# Patient Record
Sex: Male | Born: 1945
Health system: Southern US, Community
[De-identification: ages and names within clinical notes are randomized; demographics above are authoritative.]

## PROBLEM LIST (undated history)

## (undated) DIAGNOSIS — K0510 Chronic gingivitis, plaque induced: Secondary | ICD-10-CM

## (undated) DIAGNOSIS — I340 Nonrheumatic mitral (valve) insufficiency: Secondary | ICD-10-CM

## (undated) DIAGNOSIS — Z45018 Encounter for adjustment and management of other part of cardiac pacemaker: Secondary | ICD-10-CM

## (undated) DIAGNOSIS — I5023 Acute on chronic systolic (congestive) heart failure: Secondary | ICD-10-CM

## (undated) DIAGNOSIS — K08109 Complete loss of teeth, unspecified cause, unspecified class: Secondary | ICD-10-CM

## (undated) DIAGNOSIS — I428 Other cardiomyopathies: Secondary | ICD-10-CM

## (undated) DIAGNOSIS — R55 Syncope and collapse: Secondary | ICD-10-CM

## (undated) DIAGNOSIS — Z950 Presence of cardiac pacemaker: Secondary | ICD-10-CM

## (undated) DIAGNOSIS — K06020 Generalized gingival recession, unspecified: Secondary | ICD-10-CM

## (undated) DIAGNOSIS — J189 Pneumonia, unspecified organism: Secondary | ICD-10-CM

## (undated) DIAGNOSIS — I2510 Atherosclerotic heart disease of native coronary artery without angina pectoris: Secondary | ICD-10-CM

## (undated) DIAGNOSIS — I442 Atrioventricular block, complete: Secondary | ICD-10-CM

## (undated) DIAGNOSIS — I739 Peripheral vascular disease, unspecified: Secondary | ICD-10-CM

## (undated) DIAGNOSIS — E785 Hyperlipidemia, unspecified: Secondary | ICD-10-CM

## (undated) DIAGNOSIS — I10 Essential (primary) hypertension: Secondary | ICD-10-CM

## (undated) DIAGNOSIS — Z01818 Encounter for other preprocedural examination: Secondary | ICD-10-CM

## (undated) DIAGNOSIS — I429 Cardiomyopathy, unspecified: Secondary | ICD-10-CM

## (undated) DIAGNOSIS — K045 Chronic apical periodontitis: Secondary | ICD-10-CM

## (undated) HISTORY — DX: Other cardiomyopathies: I42.8

## (undated) HISTORY — PX: KNEE SURGERY: SHX244

## (undated) HISTORY — DX: Syncope and collapse: R55

## (undated) HISTORY — PX: OTHER SURGICAL HISTORY: SHX169

## (undated) HISTORY — DX: Atrioventricular block, complete: I44.2

## (undated) HISTORY — DX: Presence of cardiac pacemaker: Z95.0

## (undated) HISTORY — PX: VASECTOMY: SHX75

## (undated) HISTORY — PX: INSERT / REPLACE / REMOVE PACEMAKER: SUR710

## (undated) HISTORY — DX: Pneumonia, unspecified organism: J18.9

## (undated) HISTORY — PX: CATARACT EXTRACTION: SUR2

## (undated) HISTORY — DX: Hyperlipidemia, unspecified: E78.5

## (undated) HISTORY — DX: Peripheral vascular disease, unspecified: I73.9

## (undated) HISTORY — DX: Atherosclerotic heart disease of native coronary artery without angina pectoris: I25.10

## (undated) HISTORY — DX: Encounter for adjustment and management of other part of cardiac pacemaker: Z45.018

## (undated) HISTORY — DX: Cardiomyopathy, unspecified: I42.9

---

## 2001-09-09 ENCOUNTER — Inpatient Hospital Stay (HOSPITAL_COMMUNITY): Admission: EM | Admit: 2001-09-09 | Discharge: 2001-09-10 | Payer: Self-pay | Admitting: Cardiology

## 2001-09-10 ENCOUNTER — Encounter: Payer: Self-pay | Admitting: Internal Medicine

## 2011-03-28 DIAGNOSIS — J209 Acute bronchitis, unspecified: Secondary | ICD-10-CM | POA: Diagnosis not present

## 2011-03-28 DIAGNOSIS — Z23 Encounter for immunization: Secondary | ICD-10-CM | POA: Diagnosis not present

## 2011-04-12 DIAGNOSIS — H2589 Other age-related cataract: Secondary | ICD-10-CM | POA: Diagnosis not present

## 2011-04-19 DIAGNOSIS — H18899 Other specified disorders of cornea, unspecified eye: Secondary | ICD-10-CM | POA: Diagnosis not present

## 2011-04-26 DIAGNOSIS — Z09 Encounter for follow-up examination after completed treatment for conditions other than malignant neoplasm: Secondary | ICD-10-CM | POA: Diagnosis not present

## 2011-04-26 DIAGNOSIS — H5210 Myopia, unspecified eye: Secondary | ICD-10-CM | POA: Diagnosis not present

## 2011-05-04 DIAGNOSIS — IMO0001 Reserved for inherently not codable concepts without codable children: Secondary | ICD-10-CM | POA: Diagnosis not present

## 2011-05-04 DIAGNOSIS — M25569 Pain in unspecified knee: Secondary | ICD-10-CM | POA: Diagnosis not present

## 2011-05-04 DIAGNOSIS — S83509A Sprain of unspecified cruciate ligament of unspecified knee, initial encounter: Secondary | ICD-10-CM | POA: Diagnosis not present

## 2011-05-08 DIAGNOSIS — S83509A Sprain of unspecified cruciate ligament of unspecified knee, initial encounter: Secondary | ICD-10-CM | POA: Diagnosis not present

## 2011-05-08 DIAGNOSIS — IMO0001 Reserved for inherently not codable concepts without codable children: Secondary | ICD-10-CM | POA: Diagnosis not present

## 2011-05-08 DIAGNOSIS — M25569 Pain in unspecified knee: Secondary | ICD-10-CM | POA: Diagnosis not present

## 2011-05-09 DIAGNOSIS — I2510 Atherosclerotic heart disease of native coronary artery without angina pectoris: Secondary | ICD-10-CM | POA: Diagnosis not present

## 2011-05-09 DIAGNOSIS — Z950 Presence of cardiac pacemaker: Secondary | ICD-10-CM | POA: Diagnosis not present

## 2011-05-09 DIAGNOSIS — I059 Rheumatic mitral valve disease, unspecified: Secondary | ICD-10-CM | POA: Diagnosis not present

## 2011-05-09 DIAGNOSIS — I499 Cardiac arrhythmia, unspecified: Secondary | ICD-10-CM | POA: Diagnosis not present

## 2011-05-10 DIAGNOSIS — IMO0001 Reserved for inherently not codable concepts without codable children: Secondary | ICD-10-CM | POA: Diagnosis not present

## 2011-05-10 DIAGNOSIS — S83509A Sprain of unspecified cruciate ligament of unspecified knee, initial encounter: Secondary | ICD-10-CM | POA: Diagnosis not present

## 2011-05-10 DIAGNOSIS — M25569 Pain in unspecified knee: Secondary | ICD-10-CM | POA: Diagnosis not present

## 2011-05-19 DIAGNOSIS — IMO0001 Reserved for inherently not codable concepts without codable children: Secondary | ICD-10-CM | POA: Diagnosis not present

## 2011-05-19 DIAGNOSIS — S83509A Sprain of unspecified cruciate ligament of unspecified knee, initial encounter: Secondary | ICD-10-CM | POA: Diagnosis not present

## 2011-05-19 DIAGNOSIS — M25569 Pain in unspecified knee: Secondary | ICD-10-CM | POA: Diagnosis not present

## 2011-05-23 DIAGNOSIS — M25569 Pain in unspecified knee: Secondary | ICD-10-CM | POA: Diagnosis not present

## 2011-05-23 DIAGNOSIS — S83509A Sprain of unspecified cruciate ligament of unspecified knee, initial encounter: Secondary | ICD-10-CM | POA: Diagnosis not present

## 2011-05-23 DIAGNOSIS — IMO0001 Reserved for inherently not codable concepts without codable children: Secondary | ICD-10-CM | POA: Diagnosis not present

## 2011-05-23 DIAGNOSIS — Z950 Presence of cardiac pacemaker: Secondary | ICD-10-CM | POA: Diagnosis not present

## 2011-05-26 DIAGNOSIS — IMO0001 Reserved for inherently not codable concepts without codable children: Secondary | ICD-10-CM | POA: Diagnosis not present

## 2011-05-26 DIAGNOSIS — M25569 Pain in unspecified knee: Secondary | ICD-10-CM | POA: Diagnosis not present

## 2011-05-26 DIAGNOSIS — S83509A Sprain of unspecified cruciate ligament of unspecified knee, initial encounter: Secondary | ICD-10-CM | POA: Diagnosis not present

## 2011-05-30 DIAGNOSIS — M25569 Pain in unspecified knee: Secondary | ICD-10-CM | POA: Diagnosis not present

## 2011-05-30 DIAGNOSIS — S83509A Sprain of unspecified cruciate ligament of unspecified knee, initial encounter: Secondary | ICD-10-CM | POA: Diagnosis not present

## 2011-05-30 DIAGNOSIS — IMO0001 Reserved for inherently not codable concepts without codable children: Secondary | ICD-10-CM | POA: Diagnosis not present

## 2011-05-31 DIAGNOSIS — S83509A Sprain of unspecified cruciate ligament of unspecified knee, initial encounter: Secondary | ICD-10-CM | POA: Diagnosis not present

## 2011-05-31 DIAGNOSIS — Z6825 Body mass index (BMI) 25.0-25.9, adult: Secondary | ICD-10-CM | POA: Diagnosis not present

## 2011-06-02 DIAGNOSIS — M25569 Pain in unspecified knee: Secondary | ICD-10-CM | POA: Diagnosis not present

## 2011-06-02 DIAGNOSIS — S83509A Sprain of unspecified cruciate ligament of unspecified knee, initial encounter: Secondary | ICD-10-CM | POA: Diagnosis not present

## 2011-06-02 DIAGNOSIS — IMO0001 Reserved for inherently not codable concepts without codable children: Secondary | ICD-10-CM | POA: Diagnosis not present

## 2011-06-22 DIAGNOSIS — I499 Cardiac arrhythmia, unspecified: Secondary | ICD-10-CM | POA: Diagnosis not present

## 2011-06-22 DIAGNOSIS — Z950 Presence of cardiac pacemaker: Secondary | ICD-10-CM | POA: Diagnosis not present

## 2011-06-22 DIAGNOSIS — I498 Other specified cardiac arrhythmias: Secondary | ICD-10-CM | POA: Diagnosis not present

## 2011-06-22 DIAGNOSIS — I4430 Unspecified atrioventricular block: Secondary | ICD-10-CM | POA: Diagnosis not present

## 2011-07-12 DIAGNOSIS — Z6825 Body mass index (BMI) 25.0-25.9, adult: Secondary | ICD-10-CM | POA: Diagnosis not present

## 2011-07-12 DIAGNOSIS — S83509A Sprain of unspecified cruciate ligament of unspecified knee, initial encounter: Secondary | ICD-10-CM | POA: Diagnosis not present

## 2011-07-20 DIAGNOSIS — S83509A Sprain of unspecified cruciate ligament of unspecified knee, initial encounter: Secondary | ICD-10-CM | POA: Diagnosis not present

## 2011-07-26 DIAGNOSIS — M2240 Chondromalacia patellae, unspecified knee: Secondary | ICD-10-CM | POA: Diagnosis not present

## 2011-07-26 DIAGNOSIS — M25569 Pain in unspecified knee: Secondary | ICD-10-CM | POA: Diagnosis not present

## 2011-07-26 DIAGNOSIS — S83509A Sprain of unspecified cruciate ligament of unspecified knee, initial encounter: Secondary | ICD-10-CM | POA: Diagnosis not present

## 2011-07-27 DIAGNOSIS — S83509A Sprain of unspecified cruciate ligament of unspecified knee, initial encounter: Secondary | ICD-10-CM | POA: Diagnosis not present

## 2011-08-02 DIAGNOSIS — H2510 Age-related nuclear cataract, unspecified eye: Secondary | ICD-10-CM | POA: Diagnosis not present

## 2011-08-03 DIAGNOSIS — I059 Rheumatic mitral valve disease, unspecified: Secondary | ICD-10-CM | POA: Diagnosis not present

## 2011-08-03 DIAGNOSIS — I2510 Atherosclerotic heart disease of native coronary artery without angina pectoris: Secondary | ICD-10-CM | POA: Diagnosis not present

## 2011-08-03 DIAGNOSIS — Z950 Presence of cardiac pacemaker: Secondary | ICD-10-CM | POA: Diagnosis not present

## 2011-08-03 DIAGNOSIS — I739 Peripheral vascular disease, unspecified: Secondary | ICD-10-CM | POA: Diagnosis not present

## 2011-08-04 DIAGNOSIS — Z950 Presence of cardiac pacemaker: Secondary | ICD-10-CM | POA: Diagnosis not present

## 2011-08-30 DIAGNOSIS — Z0389 Encounter for observation for other suspected diseases and conditions ruled out: Secondary | ICD-10-CM | POA: Diagnosis not present

## 2011-08-30 DIAGNOSIS — Z950 Presence of cardiac pacemaker: Secondary | ICD-10-CM | POA: Diagnosis not present

## 2011-08-30 DIAGNOSIS — M1710 Unilateral primary osteoarthritis, unspecified knee: Secondary | ICD-10-CM | POA: Diagnosis not present

## 2011-08-30 DIAGNOSIS — S83509A Sprain of unspecified cruciate ligament of unspecified knee, initial encounter: Secondary | ICD-10-CM | POA: Diagnosis not present

## 2011-08-30 DIAGNOSIS — M2350 Chronic instability of knee, unspecified knee: Secondary | ICD-10-CM | POA: Diagnosis not present

## 2011-08-30 DIAGNOSIS — Z87891 Personal history of nicotine dependence: Secondary | ICD-10-CM | POA: Diagnosis not present

## 2011-09-04 DIAGNOSIS — M1710 Unilateral primary osteoarthritis, unspecified knee: Secondary | ICD-10-CM | POA: Diagnosis not present

## 2011-09-04 DIAGNOSIS — M2350 Chronic instability of knee, unspecified knee: Secondary | ICD-10-CM | POA: Diagnosis not present

## 2011-09-04 DIAGNOSIS — G8918 Other acute postprocedural pain: Secondary | ICD-10-CM | POA: Diagnosis not present

## 2011-09-04 DIAGNOSIS — M25569 Pain in unspecified knee: Secondary | ICD-10-CM | POA: Diagnosis not present

## 2011-09-04 DIAGNOSIS — Z87891 Personal history of nicotine dependence: Secondary | ICD-10-CM | POA: Diagnosis not present

## 2011-09-04 DIAGNOSIS — Z950 Presence of cardiac pacemaker: Secondary | ICD-10-CM | POA: Diagnosis not present

## 2011-09-04 DIAGNOSIS — S83509A Sprain of unspecified cruciate ligament of unspecified knee, initial encounter: Secondary | ICD-10-CM | POA: Diagnosis not present

## 2011-09-07 DIAGNOSIS — M25569 Pain in unspecified knee: Secondary | ICD-10-CM | POA: Diagnosis not present

## 2011-09-13 DIAGNOSIS — I499 Cardiac arrhythmia, unspecified: Secondary | ICD-10-CM | POA: Diagnosis not present

## 2011-09-13 DIAGNOSIS — M25569 Pain in unspecified knee: Secondary | ICD-10-CM | POA: Diagnosis not present

## 2011-09-13 DIAGNOSIS — I059 Rheumatic mitral valve disease, unspecified: Secondary | ICD-10-CM | POA: Diagnosis not present

## 2011-09-15 DIAGNOSIS — M25569 Pain in unspecified knee: Secondary | ICD-10-CM | POA: Diagnosis not present

## 2011-09-19 DIAGNOSIS — M25569 Pain in unspecified knee: Secondary | ICD-10-CM | POA: Diagnosis not present

## 2011-09-21 DIAGNOSIS — M25569 Pain in unspecified knee: Secondary | ICD-10-CM | POA: Diagnosis not present

## 2011-09-25 DIAGNOSIS — Z7982 Long term (current) use of aspirin: Secondary | ICD-10-CM | POA: Diagnosis not present

## 2011-09-25 DIAGNOSIS — I2510 Atherosclerotic heart disease of native coronary artery without angina pectoris: Secondary | ICD-10-CM | POA: Diagnosis not present

## 2011-09-25 DIAGNOSIS — I059 Rheumatic mitral valve disease, unspecified: Secondary | ICD-10-CM | POA: Diagnosis not present

## 2011-09-25 DIAGNOSIS — E785 Hyperlipidemia, unspecified: Secondary | ICD-10-CM | POA: Diagnosis not present

## 2011-09-25 DIAGNOSIS — Z0389 Encounter for observation for other suspected diseases and conditions ruled out: Secondary | ICD-10-CM | POA: Diagnosis not present

## 2011-09-25 DIAGNOSIS — I4430 Unspecified atrioventricular block: Secondary | ICD-10-CM | POA: Diagnosis not present

## 2011-09-25 DIAGNOSIS — Z79899 Other long term (current) drug therapy: Secondary | ICD-10-CM | POA: Diagnosis not present

## 2011-09-25 DIAGNOSIS — Z45018 Encounter for adjustment and management of other part of cardiac pacemaker: Secondary | ICD-10-CM | POA: Diagnosis not present

## 2011-09-25 DIAGNOSIS — I739 Peripheral vascular disease, unspecified: Secondary | ICD-10-CM | POA: Diagnosis not present

## 2011-09-26 DIAGNOSIS — E785 Hyperlipidemia, unspecified: Secondary | ICD-10-CM | POA: Diagnosis not present

## 2011-09-26 DIAGNOSIS — I059 Rheumatic mitral valve disease, unspecified: Secondary | ICD-10-CM | POA: Diagnosis not present

## 2011-09-26 DIAGNOSIS — Z7982 Long term (current) use of aspirin: Secondary | ICD-10-CM | POA: Diagnosis not present

## 2011-09-26 DIAGNOSIS — I739 Peripheral vascular disease, unspecified: Secondary | ICD-10-CM | POA: Diagnosis not present

## 2011-09-26 DIAGNOSIS — Z79899 Other long term (current) drug therapy: Secondary | ICD-10-CM | POA: Diagnosis not present

## 2011-09-26 DIAGNOSIS — Z23 Encounter for immunization: Secondary | ICD-10-CM | POA: Diagnosis not present

## 2011-09-26 DIAGNOSIS — Z45018 Encounter for adjustment and management of other part of cardiac pacemaker: Secondary | ICD-10-CM | POA: Diagnosis not present

## 2011-09-26 DIAGNOSIS — I2510 Atherosclerotic heart disease of native coronary artery without angina pectoris: Secondary | ICD-10-CM | POA: Diagnosis not present

## 2011-09-27 DIAGNOSIS — M25569 Pain in unspecified knee: Secondary | ICD-10-CM | POA: Diagnosis not present

## 2011-09-29 DIAGNOSIS — M25569 Pain in unspecified knee: Secondary | ICD-10-CM | POA: Diagnosis not present

## 2011-10-02 DIAGNOSIS — Z950 Presence of cardiac pacemaker: Secondary | ICD-10-CM | POA: Diagnosis not present

## 2011-10-03 DIAGNOSIS — M25569 Pain in unspecified knee: Secondary | ICD-10-CM | POA: Diagnosis not present

## 2011-10-05 DIAGNOSIS — M25569 Pain in unspecified knee: Secondary | ICD-10-CM | POA: Diagnosis not present

## 2011-10-09 DIAGNOSIS — M25569 Pain in unspecified knee: Secondary | ICD-10-CM | POA: Diagnosis not present

## 2011-10-10 DIAGNOSIS — H259 Unspecified age-related cataract: Secondary | ICD-10-CM | POA: Diagnosis not present

## 2011-10-10 DIAGNOSIS — H269 Unspecified cataract: Secondary | ICD-10-CM | POA: Diagnosis not present

## 2011-10-10 DIAGNOSIS — H2510 Age-related nuclear cataract, unspecified eye: Secondary | ICD-10-CM | POA: Diagnosis not present

## 2011-10-11 DIAGNOSIS — M25569 Pain in unspecified knee: Secondary | ICD-10-CM | POA: Diagnosis not present

## 2011-10-17 DIAGNOSIS — M25569 Pain in unspecified knee: Secondary | ICD-10-CM | POA: Diagnosis not present

## 2011-10-20 DIAGNOSIS — M25569 Pain in unspecified knee: Secondary | ICD-10-CM | POA: Diagnosis not present

## 2011-10-23 DIAGNOSIS — M25569 Pain in unspecified knee: Secondary | ICD-10-CM | POA: Diagnosis not present

## 2011-10-26 DIAGNOSIS — M25569 Pain in unspecified knee: Secondary | ICD-10-CM | POA: Diagnosis not present

## 2011-10-30 DIAGNOSIS — M25569 Pain in unspecified knee: Secondary | ICD-10-CM | POA: Diagnosis not present

## 2011-11-02 DIAGNOSIS — M25569 Pain in unspecified knee: Secondary | ICD-10-CM | POA: Diagnosis not present

## 2011-11-06 DIAGNOSIS — I2510 Atherosclerotic heart disease of native coronary artery without angina pectoris: Secondary | ICD-10-CM | POA: Diagnosis not present

## 2011-11-06 DIAGNOSIS — Z950 Presence of cardiac pacemaker: Secondary | ICD-10-CM | POA: Diagnosis not present

## 2011-11-06 DIAGNOSIS — E785 Hyperlipidemia, unspecified: Secondary | ICD-10-CM | POA: Diagnosis not present

## 2011-11-06 DIAGNOSIS — I739 Peripheral vascular disease, unspecified: Secondary | ICD-10-CM | POA: Diagnosis not present

## 2011-11-06 DIAGNOSIS — M25569 Pain in unspecified knee: Secondary | ICD-10-CM | POA: Diagnosis not present

## 2011-11-07 DIAGNOSIS — H269 Unspecified cataract: Secondary | ICD-10-CM | POA: Diagnosis not present

## 2011-11-07 DIAGNOSIS — H259 Unspecified age-related cataract: Secondary | ICD-10-CM | POA: Diagnosis not present

## 2011-11-07 DIAGNOSIS — H2510 Age-related nuclear cataract, unspecified eye: Secondary | ICD-10-CM | POA: Diagnosis not present

## 2011-11-09 DIAGNOSIS — M25569 Pain in unspecified knee: Secondary | ICD-10-CM | POA: Diagnosis not present

## 2011-11-16 DIAGNOSIS — M25569 Pain in unspecified knee: Secondary | ICD-10-CM | POA: Diagnosis not present

## 2011-11-28 DIAGNOSIS — E785 Hyperlipidemia, unspecified: Secondary | ICD-10-CM | POA: Diagnosis not present

## 2011-12-06 DIAGNOSIS — S83509A Sprain of unspecified cruciate ligament of unspecified knee, initial encounter: Secondary | ICD-10-CM | POA: Diagnosis not present

## 2011-12-19 DIAGNOSIS — Z23 Encounter for immunization: Secondary | ICD-10-CM | POA: Diagnosis not present

## 2012-01-31 DIAGNOSIS — S83509A Sprain of unspecified cruciate ligament of unspecified knee, initial encounter: Secondary | ICD-10-CM | POA: Diagnosis not present

## 2012-02-08 DIAGNOSIS — M25569 Pain in unspecified knee: Secondary | ICD-10-CM | POA: Diagnosis not present

## 2012-04-24 DIAGNOSIS — L218 Other seborrheic dermatitis: Secondary | ICD-10-CM | POA: Diagnosis not present

## 2012-04-24 DIAGNOSIS — Z0000 Encounter for general adult medical examination without abnormal findings: Secondary | ICD-10-CM | POA: Diagnosis not present

## 2012-05-03 DIAGNOSIS — C44319 Basal cell carcinoma of skin of other parts of face: Secondary | ICD-10-CM | POA: Diagnosis not present

## 2012-05-03 DIAGNOSIS — D492 Neoplasm of unspecified behavior of bone, soft tissue, and skin: Secondary | ICD-10-CM | POA: Diagnosis not present

## 2012-05-06 DIAGNOSIS — I059 Rheumatic mitral valve disease, unspecified: Secondary | ICD-10-CM | POA: Diagnosis not present

## 2012-05-06 DIAGNOSIS — Z950 Presence of cardiac pacemaker: Secondary | ICD-10-CM | POA: Diagnosis not present

## 2012-05-06 DIAGNOSIS — K5732 Diverticulitis of large intestine without perforation or abscess without bleeding: Secondary | ICD-10-CM | POA: Diagnosis not present

## 2012-05-08 DIAGNOSIS — I6529 Occlusion and stenosis of unspecified carotid artery: Secondary | ICD-10-CM | POA: Diagnosis not present

## 2012-05-13 DIAGNOSIS — I499 Cardiac arrhythmia, unspecified: Secondary | ICD-10-CM | POA: Diagnosis not present

## 2012-05-13 DIAGNOSIS — E7800 Pure hypercholesterolemia, unspecified: Secondary | ICD-10-CM | POA: Diagnosis not present

## 2012-05-13 DIAGNOSIS — E782 Mixed hyperlipidemia: Secondary | ICD-10-CM | POA: Diagnosis not present

## 2012-05-13 DIAGNOSIS — Z950 Presence of cardiac pacemaker: Secondary | ICD-10-CM | POA: Diagnosis not present

## 2012-05-14 DIAGNOSIS — L578 Other skin changes due to chronic exposure to nonionizing radiation: Secondary | ICD-10-CM | POA: Diagnosis not present

## 2012-05-14 DIAGNOSIS — L0201 Cutaneous abscess of face: Secondary | ICD-10-CM | POA: Diagnosis not present

## 2012-05-15 DIAGNOSIS — L578 Other skin changes due to chronic exposure to nonionizing radiation: Secondary | ICD-10-CM | POA: Diagnosis not present

## 2012-05-15 DIAGNOSIS — L0291 Cutaneous abscess, unspecified: Secondary | ICD-10-CM | POA: Diagnosis not present

## 2012-05-15 DIAGNOSIS — L0390 Cellulitis, unspecified: Secondary | ICD-10-CM | POA: Diagnosis not present

## 2012-05-22 DIAGNOSIS — Z23 Encounter for immunization: Secondary | ICD-10-CM | POA: Diagnosis not present

## 2012-05-22 DIAGNOSIS — E7800 Pure hypercholesterolemia, unspecified: Secondary | ICD-10-CM | POA: Diagnosis not present

## 2012-05-22 DIAGNOSIS — Z79899 Other long term (current) drug therapy: Secondary | ICD-10-CM | POA: Diagnosis not present

## 2012-05-30 DIAGNOSIS — M25569 Pain in unspecified knee: Secondary | ICD-10-CM | POA: Diagnosis not present

## 2012-08-12 DIAGNOSIS — S63659A Sprain of metacarpophalangeal joint of unspecified finger, initial encounter: Secondary | ICD-10-CM | POA: Diagnosis not present

## 2012-08-13 DIAGNOSIS — I498 Other specified cardiac arrhythmias: Secondary | ICD-10-CM | POA: Diagnosis not present

## 2012-08-14 DIAGNOSIS — S63659A Sprain of metacarpophalangeal joint of unspecified finger, initial encounter: Secondary | ICD-10-CM | POA: Diagnosis not present

## 2012-09-09 DIAGNOSIS — S63659A Sprain of metacarpophalangeal joint of unspecified finger, initial encounter: Secondary | ICD-10-CM | POA: Diagnosis not present

## 2012-09-11 DIAGNOSIS — S63659A Sprain of metacarpophalangeal joint of unspecified finger, initial encounter: Secondary | ICD-10-CM | POA: Diagnosis not present

## 2012-09-16 DIAGNOSIS — M25559 Pain in unspecified hip: Secondary | ICD-10-CM | POA: Diagnosis not present

## 2012-09-24 DIAGNOSIS — S63659A Sprain of metacarpophalangeal joint of unspecified finger, initial encounter: Secondary | ICD-10-CM | POA: Diagnosis not present

## 2012-11-06 DIAGNOSIS — S63659A Sprain of metacarpophalangeal joint of unspecified finger, initial encounter: Secondary | ICD-10-CM | POA: Diagnosis not present

## 2012-11-08 DIAGNOSIS — M25549 Pain in joints of unspecified hand: Secondary | ICD-10-CM | POA: Diagnosis not present

## 2012-11-08 DIAGNOSIS — S63659A Sprain of metacarpophalangeal joint of unspecified finger, initial encounter: Secondary | ICD-10-CM | POA: Diagnosis not present

## 2012-11-08 DIAGNOSIS — IMO0001 Reserved for inherently not codable concepts without codable children: Secondary | ICD-10-CM | POA: Diagnosis not present

## 2012-11-08 DIAGNOSIS — M25649 Stiffness of unspecified hand, not elsewhere classified: Secondary | ICD-10-CM | POA: Diagnosis not present

## 2012-11-11 DIAGNOSIS — IMO0001 Reserved for inherently not codable concepts without codable children: Secondary | ICD-10-CM | POA: Diagnosis not present

## 2012-11-11 DIAGNOSIS — M25549 Pain in joints of unspecified hand: Secondary | ICD-10-CM | POA: Diagnosis not present

## 2012-11-11 DIAGNOSIS — S63659A Sprain of metacarpophalangeal joint of unspecified finger, initial encounter: Secondary | ICD-10-CM | POA: Diagnosis not present

## 2012-11-11 DIAGNOSIS — M25649 Stiffness of unspecified hand, not elsewhere classified: Secondary | ICD-10-CM | POA: Diagnosis not present

## 2012-11-14 DIAGNOSIS — IMO0001 Reserved for inherently not codable concepts without codable children: Secondary | ICD-10-CM | POA: Diagnosis not present

## 2012-11-14 DIAGNOSIS — S63659A Sprain of metacarpophalangeal joint of unspecified finger, initial encounter: Secondary | ICD-10-CM | POA: Diagnosis not present

## 2012-11-14 DIAGNOSIS — M25549 Pain in joints of unspecified hand: Secondary | ICD-10-CM | POA: Diagnosis not present

## 2012-11-14 DIAGNOSIS — M25649 Stiffness of unspecified hand, not elsewhere classified: Secondary | ICD-10-CM | POA: Diagnosis not present

## 2012-11-21 DIAGNOSIS — M25549 Pain in joints of unspecified hand: Secondary | ICD-10-CM | POA: Diagnosis not present

## 2012-11-21 DIAGNOSIS — IMO0001 Reserved for inherently not codable concepts without codable children: Secondary | ICD-10-CM | POA: Diagnosis not present

## 2012-11-21 DIAGNOSIS — S63659A Sprain of metacarpophalangeal joint of unspecified finger, initial encounter: Secondary | ICD-10-CM | POA: Diagnosis not present

## 2012-11-21 DIAGNOSIS — M25649 Stiffness of unspecified hand, not elsewhere classified: Secondary | ICD-10-CM | POA: Diagnosis not present

## 2012-11-25 DIAGNOSIS — IMO0001 Reserved for inherently not codable concepts without codable children: Secondary | ICD-10-CM | POA: Diagnosis not present

## 2012-11-25 DIAGNOSIS — S63659A Sprain of metacarpophalangeal joint of unspecified finger, initial encounter: Secondary | ICD-10-CM | POA: Diagnosis not present

## 2012-11-25 DIAGNOSIS — M25649 Stiffness of unspecified hand, not elsewhere classified: Secondary | ICD-10-CM | POA: Diagnosis not present

## 2012-11-25 DIAGNOSIS — M25549 Pain in joints of unspecified hand: Secondary | ICD-10-CM | POA: Diagnosis not present

## 2012-11-28 DIAGNOSIS — IMO0001 Reserved for inherently not codable concepts without codable children: Secondary | ICD-10-CM | POA: Diagnosis not present

## 2012-11-28 DIAGNOSIS — M25549 Pain in joints of unspecified hand: Secondary | ICD-10-CM | POA: Diagnosis not present

## 2012-11-28 DIAGNOSIS — M25649 Stiffness of unspecified hand, not elsewhere classified: Secondary | ICD-10-CM | POA: Diagnosis not present

## 2012-11-28 DIAGNOSIS — S63659A Sprain of metacarpophalangeal joint of unspecified finger, initial encounter: Secondary | ICD-10-CM | POA: Diagnosis not present

## 2012-12-03 DIAGNOSIS — M25649 Stiffness of unspecified hand, not elsewhere classified: Secondary | ICD-10-CM | POA: Diagnosis not present

## 2012-12-03 DIAGNOSIS — S63659A Sprain of metacarpophalangeal joint of unspecified finger, initial encounter: Secondary | ICD-10-CM | POA: Diagnosis not present

## 2012-12-03 DIAGNOSIS — IMO0001 Reserved for inherently not codable concepts without codable children: Secondary | ICD-10-CM | POA: Diagnosis not present

## 2012-12-03 DIAGNOSIS — M25549 Pain in joints of unspecified hand: Secondary | ICD-10-CM | POA: Diagnosis not present

## 2012-12-04 DIAGNOSIS — H26499 Other secondary cataract, unspecified eye: Secondary | ICD-10-CM | POA: Diagnosis not present

## 2012-12-04 DIAGNOSIS — S63659A Sprain of metacarpophalangeal joint of unspecified finger, initial encounter: Secondary | ICD-10-CM | POA: Diagnosis not present

## 2012-12-06 DIAGNOSIS — IMO0001 Reserved for inherently not codable concepts without codable children: Secondary | ICD-10-CM | POA: Diagnosis not present

## 2012-12-06 DIAGNOSIS — M25549 Pain in joints of unspecified hand: Secondary | ICD-10-CM | POA: Diagnosis not present

## 2012-12-06 DIAGNOSIS — S63659A Sprain of metacarpophalangeal joint of unspecified finger, initial encounter: Secondary | ICD-10-CM | POA: Diagnosis not present

## 2012-12-06 DIAGNOSIS — M25649 Stiffness of unspecified hand, not elsewhere classified: Secondary | ICD-10-CM | POA: Diagnosis not present

## 2012-12-09 DIAGNOSIS — IMO0001 Reserved for inherently not codable concepts without codable children: Secondary | ICD-10-CM | POA: Diagnosis not present

## 2012-12-09 DIAGNOSIS — M25549 Pain in joints of unspecified hand: Secondary | ICD-10-CM | POA: Diagnosis not present

## 2012-12-09 DIAGNOSIS — M25649 Stiffness of unspecified hand, not elsewhere classified: Secondary | ICD-10-CM | POA: Diagnosis not present

## 2012-12-09 DIAGNOSIS — S63659A Sprain of metacarpophalangeal joint of unspecified finger, initial encounter: Secondary | ICD-10-CM | POA: Diagnosis not present

## 2012-12-10 DIAGNOSIS — I059 Rheumatic mitral valve disease, unspecified: Secondary | ICD-10-CM | POA: Diagnosis not present

## 2012-12-10 DIAGNOSIS — Z950 Presence of cardiac pacemaker: Secondary | ICD-10-CM | POA: Diagnosis not present

## 2012-12-10 DIAGNOSIS — I739 Peripheral vascular disease, unspecified: Secondary | ICD-10-CM | POA: Diagnosis not present

## 2012-12-10 DIAGNOSIS — E785 Hyperlipidemia, unspecified: Secondary | ICD-10-CM | POA: Diagnosis not present

## 2012-12-10 DIAGNOSIS — I2510 Atherosclerotic heart disease of native coronary artery without angina pectoris: Secondary | ICD-10-CM | POA: Diagnosis not present

## 2012-12-12 DIAGNOSIS — IMO0001 Reserved for inherently not codable concepts without codable children: Secondary | ICD-10-CM | POA: Diagnosis not present

## 2012-12-12 DIAGNOSIS — S63659A Sprain of metacarpophalangeal joint of unspecified finger, initial encounter: Secondary | ICD-10-CM | POA: Diagnosis not present

## 2012-12-12 DIAGNOSIS — M25549 Pain in joints of unspecified hand: Secondary | ICD-10-CM | POA: Diagnosis not present

## 2012-12-12 DIAGNOSIS — M25649 Stiffness of unspecified hand, not elsewhere classified: Secondary | ICD-10-CM | POA: Diagnosis not present

## 2012-12-20 DIAGNOSIS — Z23 Encounter for immunization: Secondary | ICD-10-CM | POA: Diagnosis not present

## 2013-01-29 DIAGNOSIS — S63659A Sprain of metacarpophalangeal joint of unspecified finger, initial encounter: Secondary | ICD-10-CM | POA: Diagnosis not present

## 2013-01-29 DIAGNOSIS — G5600 Carpal tunnel syndrome, unspecified upper limb: Secondary | ICD-10-CM | POA: Diagnosis not present

## 2013-02-20 DIAGNOSIS — M25539 Pain in unspecified wrist: Secondary | ICD-10-CM | POA: Diagnosis not present

## 2013-02-20 DIAGNOSIS — R209 Unspecified disturbances of skin sensation: Secondary | ICD-10-CM | POA: Diagnosis not present

## 2013-02-20 DIAGNOSIS — I442 Atrioventricular block, complete: Secondary | ICD-10-CM | POA: Diagnosis not present

## 2013-02-20 DIAGNOSIS — Z950 Presence of cardiac pacemaker: Secondary | ICD-10-CM | POA: Diagnosis not present

## 2013-02-26 DIAGNOSIS — G5600 Carpal tunnel syndrome, unspecified upper limb: Secondary | ICD-10-CM | POA: Diagnosis not present

## 2013-03-06 DIAGNOSIS — J069 Acute upper respiratory infection, unspecified: Secondary | ICD-10-CM | POA: Diagnosis not present

## 2013-03-06 DIAGNOSIS — J0100 Acute maxillary sinusitis, unspecified: Secondary | ICD-10-CM | POA: Diagnosis not present

## 2013-03-12 DIAGNOSIS — G5600 Carpal tunnel syndrome, unspecified upper limb: Secondary | ICD-10-CM | POA: Diagnosis not present

## 2013-03-26 DIAGNOSIS — G5600 Carpal tunnel syndrome, unspecified upper limb: Secondary | ICD-10-CM | POA: Diagnosis not present

## 2013-05-24 DIAGNOSIS — I498 Other specified cardiac arrhythmias: Secondary | ICD-10-CM | POA: Diagnosis not present

## 2013-06-16 DIAGNOSIS — I059 Rheumatic mitral valve disease, unspecified: Secondary | ICD-10-CM | POA: Diagnosis not present

## 2013-06-16 DIAGNOSIS — Z950 Presence of cardiac pacemaker: Secondary | ICD-10-CM | POA: Diagnosis not present

## 2013-06-16 DIAGNOSIS — E785 Hyperlipidemia, unspecified: Secondary | ICD-10-CM | POA: Diagnosis not present

## 2013-06-16 DIAGNOSIS — I499 Cardiac arrhythmia, unspecified: Secondary | ICD-10-CM | POA: Diagnosis not present

## 2013-06-16 DIAGNOSIS — I2510 Atherosclerotic heart disease of native coronary artery without angina pectoris: Secondary | ICD-10-CM | POA: Diagnosis not present

## 2013-06-16 DIAGNOSIS — I739 Peripheral vascular disease, unspecified: Secondary | ICD-10-CM | POA: Diagnosis not present

## 2013-06-19 DIAGNOSIS — I6529 Occlusion and stenosis of unspecified carotid artery: Secondary | ICD-10-CM | POA: Diagnosis not present

## 2013-12-08 DIAGNOSIS — Z23 Encounter for immunization: Secondary | ICD-10-CM | POA: Diagnosis not present

## 2013-12-08 DIAGNOSIS — H26493 Other secondary cataract, bilateral: Secondary | ICD-10-CM | POA: Diagnosis not present

## 2014-02-24 DIAGNOSIS — I498 Other specified cardiac arrhythmias: Secondary | ICD-10-CM | POA: Diagnosis not present

## 2014-04-23 DIAGNOSIS — I440 Atrioventricular block, first degree: Secondary | ICD-10-CM | POA: Diagnosis not present

## 2014-06-11 DIAGNOSIS — E782 Mixed hyperlipidemia: Secondary | ICD-10-CM | POA: Diagnosis not present

## 2014-06-11 DIAGNOSIS — N402 Nodular prostate without lower urinary tract symptoms: Secondary | ICD-10-CM | POA: Diagnosis not present

## 2014-06-11 DIAGNOSIS — I10 Essential (primary) hypertension: Secondary | ICD-10-CM | POA: Diagnosis not present

## 2014-06-11 DIAGNOSIS — Z0000 Encounter for general adult medical examination without abnormal findings: Secondary | ICD-10-CM | POA: Diagnosis not present

## 2014-06-11 DIAGNOSIS — Z23 Encounter for immunization: Secondary | ICD-10-CM | POA: Diagnosis not present

## 2014-06-11 DIAGNOSIS — Z79899 Other long term (current) drug therapy: Secondary | ICD-10-CM | POA: Diagnosis not present

## 2014-06-11 DIAGNOSIS — Z1389 Encounter for screening for other disorder: Secondary | ICD-10-CM | POA: Diagnosis not present

## 2014-06-18 DIAGNOSIS — F4323 Adjustment disorder with mixed anxiety and depressed mood: Secondary | ICD-10-CM | POA: Diagnosis not present

## 2014-06-19 DIAGNOSIS — L570 Actinic keratosis: Secondary | ICD-10-CM | POA: Diagnosis not present

## 2014-06-19 DIAGNOSIS — L089 Local infection of the skin and subcutaneous tissue, unspecified: Secondary | ICD-10-CM | POA: Diagnosis not present

## 2014-06-25 DIAGNOSIS — F4323 Adjustment disorder with mixed anxiety and depressed mood: Secondary | ICD-10-CM | POA: Diagnosis not present

## 2014-07-01 DIAGNOSIS — E785 Hyperlipidemia, unspecified: Secondary | ICD-10-CM | POA: Diagnosis not present

## 2014-07-01 DIAGNOSIS — I739 Peripheral vascular disease, unspecified: Secondary | ICD-10-CM | POA: Diagnosis not present

## 2014-07-01 DIAGNOSIS — I2510 Atherosclerotic heart disease of native coronary artery without angina pectoris: Secondary | ICD-10-CM | POA: Diagnosis not present

## 2014-07-01 DIAGNOSIS — Z950 Presence of cardiac pacemaker: Secondary | ICD-10-CM | POA: Diagnosis not present

## 2014-07-02 DIAGNOSIS — F4323 Adjustment disorder with mixed anxiety and depressed mood: Secondary | ICD-10-CM | POA: Diagnosis not present

## 2014-07-16 DIAGNOSIS — F4323 Adjustment disorder with mixed anxiety and depressed mood: Secondary | ICD-10-CM | POA: Diagnosis not present

## 2014-07-24 DIAGNOSIS — Z45010 Encounter for checking and testing of cardiac pacemaker pulse generator [battery]: Secondary | ICD-10-CM | POA: Diagnosis not present

## 2014-07-24 DIAGNOSIS — I498 Other specified cardiac arrhythmias: Secondary | ICD-10-CM | POA: Diagnosis not present

## 2014-08-03 DIAGNOSIS — F4323 Adjustment disorder with mixed anxiety and depressed mood: Secondary | ICD-10-CM | POA: Diagnosis not present

## 2014-08-20 DIAGNOSIS — F4323 Adjustment disorder with mixed anxiety and depressed mood: Secondary | ICD-10-CM | POA: Diagnosis not present

## 2014-08-31 DIAGNOSIS — F4323 Adjustment disorder with mixed anxiety and depressed mood: Secondary | ICD-10-CM | POA: Diagnosis not present

## 2014-09-09 DIAGNOSIS — Z1389 Encounter for screening for other disorder: Secondary | ICD-10-CM | POA: Diagnosis not present

## 2014-09-09 DIAGNOSIS — M542 Cervicalgia: Secondary | ICD-10-CM | POA: Diagnosis not present

## 2014-09-16 DIAGNOSIS — M542 Cervicalgia: Secondary | ICD-10-CM | POA: Diagnosis not present

## 2014-09-16 DIAGNOSIS — M47812 Spondylosis without myelopathy or radiculopathy, cervical region: Secondary | ICD-10-CM | POA: Diagnosis not present

## 2014-09-16 DIAGNOSIS — M47892 Other spondylosis, cervical region: Secondary | ICD-10-CM | POA: Diagnosis not present

## 2014-10-28 DIAGNOSIS — I498 Other specified cardiac arrhythmias: Secondary | ICD-10-CM | POA: Diagnosis not present

## 2014-10-28 DIAGNOSIS — Z45010 Encounter for checking and testing of cardiac pacemaker pulse generator [battery]: Secondary | ICD-10-CM | POA: Diagnosis not present

## 2014-12-11 DIAGNOSIS — H26493 Other secondary cataract, bilateral: Secondary | ICD-10-CM | POA: Diagnosis not present

## 2014-12-29 DIAGNOSIS — Z0000 Encounter for general adult medical examination without abnormal findings: Secondary | ICD-10-CM | POA: Diagnosis not present

## 2015-02-03 DIAGNOSIS — Z45010 Encounter for checking and testing of cardiac pacemaker pulse generator [battery]: Secondary | ICD-10-CM | POA: Diagnosis not present

## 2015-02-03 DIAGNOSIS — I498 Other specified cardiac arrhythmias: Secondary | ICD-10-CM | POA: Diagnosis not present

## 2015-04-21 DIAGNOSIS — M9901 Segmental and somatic dysfunction of cervical region: Secondary | ICD-10-CM | POA: Diagnosis not present

## 2015-04-21 DIAGNOSIS — M9902 Segmental and somatic dysfunction of thoracic region: Secondary | ICD-10-CM | POA: Diagnosis not present

## 2015-04-21 DIAGNOSIS — M530 Cervicocranial syndrome: Secondary | ICD-10-CM | POA: Diagnosis not present

## 2015-04-23 DIAGNOSIS — M9901 Segmental and somatic dysfunction of cervical region: Secondary | ICD-10-CM | POA: Diagnosis not present

## 2015-04-23 DIAGNOSIS — M9902 Segmental and somatic dysfunction of thoracic region: Secondary | ICD-10-CM | POA: Diagnosis not present

## 2015-04-23 DIAGNOSIS — M791 Myalgia: Secondary | ICD-10-CM | POA: Diagnosis not present

## 2015-04-23 DIAGNOSIS — M530 Cervicocranial syndrome: Secondary | ICD-10-CM | POA: Diagnosis not present

## 2015-04-28 DIAGNOSIS — M9902 Segmental and somatic dysfunction of thoracic region: Secondary | ICD-10-CM | POA: Diagnosis not present

## 2015-04-28 DIAGNOSIS — M9901 Segmental and somatic dysfunction of cervical region: Secondary | ICD-10-CM | POA: Diagnosis not present

## 2015-04-28 DIAGNOSIS — M530 Cervicocranial syndrome: Secondary | ICD-10-CM | POA: Diagnosis not present

## 2015-04-28 DIAGNOSIS — M791 Myalgia: Secondary | ICD-10-CM | POA: Diagnosis not present

## 2015-05-03 DIAGNOSIS — M9901 Segmental and somatic dysfunction of cervical region: Secondary | ICD-10-CM | POA: Diagnosis not present

## 2015-05-03 DIAGNOSIS — M791 Myalgia: Secondary | ICD-10-CM | POA: Diagnosis not present

## 2015-05-03 DIAGNOSIS — M9902 Segmental and somatic dysfunction of thoracic region: Secondary | ICD-10-CM | POA: Diagnosis not present

## 2015-05-03 DIAGNOSIS — M530 Cervicocranial syndrome: Secondary | ICD-10-CM | POA: Diagnosis not present

## 2015-05-10 DIAGNOSIS — M530 Cervicocranial syndrome: Secondary | ICD-10-CM | POA: Diagnosis not present

## 2015-05-10 DIAGNOSIS — M9901 Segmental and somatic dysfunction of cervical region: Secondary | ICD-10-CM | POA: Diagnosis not present

## 2015-05-10 DIAGNOSIS — M9902 Segmental and somatic dysfunction of thoracic region: Secondary | ICD-10-CM | POA: Diagnosis not present

## 2015-05-10 DIAGNOSIS — M791 Myalgia: Secondary | ICD-10-CM | POA: Diagnosis not present

## 2015-06-10 DIAGNOSIS — Z45018 Encounter for adjustment and management of other part of cardiac pacemaker: Secondary | ICD-10-CM | POA: Insufficient documentation

## 2015-06-10 DIAGNOSIS — I442 Atrioventricular block, complete: Secondary | ICD-10-CM | POA: Diagnosis not present

## 2015-06-10 HISTORY — DX: Encounter for adjustment and management of other part of cardiac pacemaker: Z45.018

## 2015-06-10 HISTORY — DX: Atrioventricular block, complete: I44.2

## 2015-07-05 DIAGNOSIS — M76899 Other specified enthesopathies of unspecified lower limb, excluding foot: Secondary | ICD-10-CM | POA: Diagnosis not present

## 2015-07-29 DIAGNOSIS — Z79899 Other long term (current) drug therapy: Secondary | ICD-10-CM | POA: Diagnosis not present

## 2015-07-29 DIAGNOSIS — Z0000 Encounter for general adult medical examination without abnormal findings: Secondary | ICD-10-CM | POA: Diagnosis not present

## 2015-07-29 DIAGNOSIS — E782 Mixed hyperlipidemia: Secondary | ICD-10-CM | POA: Diagnosis not present

## 2015-07-29 DIAGNOSIS — R0989 Other specified symptoms and signs involving the circulatory and respiratory systems: Secondary | ICD-10-CM | POA: Diagnosis not present

## 2015-07-29 DIAGNOSIS — Z1211 Encounter for screening for malignant neoplasm of colon: Secondary | ICD-10-CM | POA: Diagnosis not present

## 2015-07-29 DIAGNOSIS — Z125 Encounter for screening for malignant neoplasm of prostate: Secondary | ICD-10-CM | POA: Diagnosis not present

## 2015-08-03 DIAGNOSIS — Z23 Encounter for immunization: Secondary | ICD-10-CM | POA: Diagnosis not present

## 2015-08-04 DIAGNOSIS — Z1211 Encounter for screening for malignant neoplasm of colon: Secondary | ICD-10-CM | POA: Diagnosis not present

## 2015-08-19 DIAGNOSIS — I6523 Occlusion and stenosis of bilateral carotid arteries: Secondary | ICD-10-CM | POA: Diagnosis not present

## 2015-08-19 DIAGNOSIS — N281 Cyst of kidney, acquired: Secondary | ICD-10-CM | POA: Diagnosis not present

## 2015-08-19 DIAGNOSIS — N189 Chronic kidney disease, unspecified: Secondary | ICD-10-CM | POA: Diagnosis not present

## 2015-08-19 DIAGNOSIS — R0989 Other specified symptoms and signs involving the circulatory and respiratory systems: Secondary | ICD-10-CM | POA: Diagnosis not present

## 2015-08-27 DIAGNOSIS — C44712 Basal cell carcinoma of skin of right lower limb, including hip: Secondary | ICD-10-CM | POA: Diagnosis not present

## 2015-09-09 DIAGNOSIS — Z950 Presence of cardiac pacemaker: Secondary | ICD-10-CM | POA: Diagnosis not present

## 2015-10-14 ENCOUNTER — Other Ambulatory Visit: Payer: Self-pay

## 2015-11-04 DIAGNOSIS — S6390XA Sprain of unspecified part of unspecified wrist and hand, initial encounter: Secondary | ICD-10-CM | POA: Diagnosis not present

## 2015-11-04 DIAGNOSIS — N400 Enlarged prostate without lower urinary tract symptoms: Secondary | ICD-10-CM | POA: Diagnosis not present

## 2015-11-08 DIAGNOSIS — M629 Disorder of muscle, unspecified: Secondary | ICD-10-CM | POA: Diagnosis not present

## 2015-11-08 DIAGNOSIS — S6390XS Sprain of unspecified part of unspecified wrist and hand, sequela: Secondary | ICD-10-CM | POA: Diagnosis not present

## 2015-11-08 DIAGNOSIS — M79645 Pain in left finger(s): Secondary | ICD-10-CM | POA: Diagnosis not present

## 2015-11-08 DIAGNOSIS — M79644 Pain in right finger(s): Secondary | ICD-10-CM | POA: Diagnosis not present

## 2015-11-19 DIAGNOSIS — S6390XS Sprain of unspecified part of unspecified wrist and hand, sequela: Secondary | ICD-10-CM | POA: Diagnosis not present

## 2015-11-19 DIAGNOSIS — M79645 Pain in left finger(s): Secondary | ICD-10-CM | POA: Diagnosis not present

## 2015-11-19 DIAGNOSIS — M79644 Pain in right finger(s): Secondary | ICD-10-CM | POA: Diagnosis not present

## 2015-11-19 DIAGNOSIS — M629 Disorder of muscle, unspecified: Secondary | ICD-10-CM | POA: Diagnosis not present

## 2015-11-26 DIAGNOSIS — S6390XS Sprain of unspecified part of unspecified wrist and hand, sequela: Secondary | ICD-10-CM | POA: Diagnosis not present

## 2015-11-26 DIAGNOSIS — M79644 Pain in right finger(s): Secondary | ICD-10-CM | POA: Diagnosis not present

## 2015-11-26 DIAGNOSIS — M79645 Pain in left finger(s): Secondary | ICD-10-CM | POA: Diagnosis not present

## 2015-11-26 DIAGNOSIS — M629 Disorder of muscle, unspecified: Secondary | ICD-10-CM | POA: Diagnosis not present

## 2015-12-04 DIAGNOSIS — K529 Noninfective gastroenteritis and colitis, unspecified: Secondary | ICD-10-CM | POA: Diagnosis not present

## 2015-12-04 DIAGNOSIS — N281 Cyst of kidney, acquired: Secondary | ICD-10-CM | POA: Diagnosis not present

## 2015-12-08 DIAGNOSIS — R109 Unspecified abdominal pain: Secondary | ICD-10-CM | POA: Diagnosis not present

## 2015-12-09 DIAGNOSIS — M629 Disorder of muscle, unspecified: Secondary | ICD-10-CM | POA: Diagnosis not present

## 2015-12-09 DIAGNOSIS — M79644 Pain in right finger(s): Secondary | ICD-10-CM | POA: Diagnosis not present

## 2015-12-09 DIAGNOSIS — S6390XS Sprain of unspecified part of unspecified wrist and hand, sequela: Secondary | ICD-10-CM | POA: Diagnosis not present

## 2015-12-09 DIAGNOSIS — M79645 Pain in left finger(s): Secondary | ICD-10-CM | POA: Diagnosis not present

## 2015-12-10 DIAGNOSIS — N410 Acute prostatitis: Secondary | ICD-10-CM | POA: Diagnosis not present

## 2015-12-10 DIAGNOSIS — Z950 Presence of cardiac pacemaker: Secondary | ICD-10-CM | POA: Diagnosis not present

## 2015-12-14 DIAGNOSIS — H26493 Other secondary cataract, bilateral: Secondary | ICD-10-CM | POA: Diagnosis not present

## 2015-12-17 DIAGNOSIS — M79645 Pain in left finger(s): Secondary | ICD-10-CM | POA: Diagnosis not present

## 2015-12-17 DIAGNOSIS — M79644 Pain in right finger(s): Secondary | ICD-10-CM | POA: Diagnosis not present

## 2015-12-17 DIAGNOSIS — S6390XS Sprain of unspecified part of unspecified wrist and hand, sequela: Secondary | ICD-10-CM | POA: Diagnosis not present

## 2015-12-17 DIAGNOSIS — M629 Disorder of muscle, unspecified: Secondary | ICD-10-CM | POA: Diagnosis not present

## 2015-12-22 DIAGNOSIS — Z1211 Encounter for screening for malignant neoplasm of colon: Secondary | ICD-10-CM | POA: Diagnosis not present

## 2016-01-24 DIAGNOSIS — Z9181 History of falling: Secondary | ICD-10-CM | POA: Diagnosis not present

## 2016-01-24 DIAGNOSIS — Z1389 Encounter for screening for other disorder: Secondary | ICD-10-CM | POA: Diagnosis not present

## 2016-01-24 DIAGNOSIS — N401 Enlarged prostate with lower urinary tract symptoms: Secondary | ICD-10-CM | POA: Diagnosis not present

## 2016-01-24 DIAGNOSIS — N138 Other obstructive and reflux uropathy: Secondary | ICD-10-CM | POA: Diagnosis not present

## 2016-05-10 DIAGNOSIS — N401 Enlarged prostate with lower urinary tract symptoms: Secondary | ICD-10-CM | POA: Diagnosis not present

## 2016-05-10 DIAGNOSIS — N138 Other obstructive and reflux uropathy: Secondary | ICD-10-CM | POA: Diagnosis not present

## 2016-05-10 DIAGNOSIS — Z9181 History of falling: Secondary | ICD-10-CM | POA: Diagnosis not present

## 2016-05-10 DIAGNOSIS — Z1389 Encounter for screening for other disorder: Secondary | ICD-10-CM | POA: Diagnosis not present

## 2016-06-22 DIAGNOSIS — I442 Atrioventricular block, complete: Secondary | ICD-10-CM | POA: Diagnosis not present

## 2016-06-22 DIAGNOSIS — Z45018 Encounter for adjustment and management of other part of cardiac pacemaker: Secondary | ICD-10-CM | POA: Diagnosis not present

## 2016-11-27 DIAGNOSIS — R0989 Other specified symptoms and signs involving the circulatory and respiratory systems: Secondary | ICD-10-CM | POA: Diagnosis not present

## 2016-11-27 DIAGNOSIS — Z79899 Other long term (current) drug therapy: Secondary | ICD-10-CM | POA: Diagnosis not present

## 2016-11-27 DIAGNOSIS — Z125 Encounter for screening for malignant neoplasm of prostate: Secondary | ICD-10-CM | POA: Diagnosis not present

## 2016-11-27 DIAGNOSIS — Z0000 Encounter for general adult medical examination without abnormal findings: Secondary | ICD-10-CM | POA: Diagnosis not present

## 2016-11-27 DIAGNOSIS — R011 Cardiac murmur, unspecified: Secondary | ICD-10-CM | POA: Diagnosis not present

## 2016-11-27 DIAGNOSIS — Z23 Encounter for immunization: Secondary | ICD-10-CM | POA: Diagnosis not present

## 2016-11-27 DIAGNOSIS — Z1331 Encounter for screening for depression: Secondary | ICD-10-CM | POA: Diagnosis not present

## 2016-12-04 DIAGNOSIS — R0989 Other specified symptoms and signs involving the circulatory and respiratory systems: Secondary | ICD-10-CM | POA: Diagnosis not present

## 2016-12-04 DIAGNOSIS — I659 Occlusion and stenosis of unspecified precerebral artery: Secondary | ICD-10-CM | POA: Diagnosis not present

## 2017-04-05 DIAGNOSIS — F4323 Adjustment disorder with mixed anxiety and depressed mood: Secondary | ICD-10-CM | POA: Diagnosis not present

## 2017-04-12 DIAGNOSIS — F4323 Adjustment disorder with mixed anxiety and depressed mood: Secondary | ICD-10-CM | POA: Diagnosis not present

## 2017-04-23 DIAGNOSIS — Z9181 History of falling: Secondary | ICD-10-CM | POA: Diagnosis not present

## 2017-04-23 DIAGNOSIS — R41840 Attention and concentration deficit: Secondary | ICD-10-CM | POA: Diagnosis not present

## 2017-04-23 DIAGNOSIS — Z1331 Encounter for screening for depression: Secondary | ICD-10-CM | POA: Diagnosis not present

## 2017-04-23 DIAGNOSIS — I4510 Unspecified right bundle-branch block: Secondary | ICD-10-CM | POA: Diagnosis not present

## 2017-04-25 DIAGNOSIS — E785 Hyperlipidemia, unspecified: Secondary | ICD-10-CM | POA: Diagnosis not present

## 2017-04-25 DIAGNOSIS — Z79899 Other long term (current) drug therapy: Secondary | ICD-10-CM | POA: Diagnosis not present

## 2017-04-27 DIAGNOSIS — I340 Nonrheumatic mitral (valve) insufficiency: Secondary | ICD-10-CM | POA: Diagnosis not present

## 2017-05-03 DIAGNOSIS — Z1331 Encounter for screening for depression: Secondary | ICD-10-CM | POA: Diagnosis not present

## 2017-05-03 DIAGNOSIS — Z9181 History of falling: Secondary | ICD-10-CM | POA: Diagnosis not present

## 2017-05-03 DIAGNOSIS — J0100 Acute maxillary sinusitis, unspecified: Secondary | ICD-10-CM | POA: Diagnosis not present

## 2017-05-03 DIAGNOSIS — I420 Dilated cardiomyopathy: Secondary | ICD-10-CM | POA: Diagnosis not present

## 2017-05-08 ENCOUNTER — Encounter: Payer: Self-pay | Admitting: Cardiology

## 2017-05-08 ENCOUNTER — Ambulatory Visit (INDEPENDENT_AMBULATORY_CARE_PROVIDER_SITE_OTHER): Payer: Medicare Other | Admitting: Cardiology

## 2017-05-08 ENCOUNTER — Ambulatory Visit (HOSPITAL_BASED_OUTPATIENT_CLINIC_OR_DEPARTMENT_OTHER)
Admission: RE | Admit: 2017-05-08 | Discharge: 2017-05-08 | Disposition: A | Discharge status: Home / Self Care | Payer: Medicare Other | Source: Ambulatory Visit | Attending: Cardiology | Admitting: Cardiology

## 2017-05-08 VITALS — BP 120/64 | HR 84 | Ht 71.0 in | Wt 162.0 lb

## 2017-05-08 DIAGNOSIS — I739 Peripheral vascular disease, unspecified: Secondary | ICD-10-CM | POA: Insufficient documentation

## 2017-05-08 DIAGNOSIS — Z950 Presence of cardiac pacemaker: Secondary | ICD-10-CM | POA: Insufficient documentation

## 2017-05-08 DIAGNOSIS — I517 Cardiomegaly: Secondary | ICD-10-CM | POA: Diagnosis not present

## 2017-05-08 DIAGNOSIS — Z01818 Encounter for other preprocedural examination: Secondary | ICD-10-CM | POA: Diagnosis not present

## 2017-05-08 DIAGNOSIS — I429 Cardiomyopathy, unspecified: Secondary | ICD-10-CM | POA: Insufficient documentation

## 2017-05-08 DIAGNOSIS — E785 Hyperlipidemia, unspecified: Secondary | ICD-10-CM

## 2017-05-08 DIAGNOSIS — I442 Atrioventricular block, complete: Secondary | ICD-10-CM | POA: Insufficient documentation

## 2017-05-08 DIAGNOSIS — I255 Ischemic cardiomyopathy: Secondary | ICD-10-CM

## 2017-05-08 HISTORY — DX: Hyperlipidemia, unspecified: E78.5

## 2017-05-08 HISTORY — DX: Peripheral vascular disease, unspecified: I73.9

## 2017-05-08 HISTORY — DX: Cardiomyopathy, unspecified: I42.9

## 2017-05-08 MED ORDER — ASPIRIN EC 81 MG PO TBEC: 81.0000 mg | DELAYED_RELEASE_TABLET | Freq: Every day | ORAL | 3 refills | Status: DC

## 2017-05-08 MED ORDER — CARVEDILOL 3.125 MG PO TABS: 3.1250 mg | ORAL_TABLET | Freq: Two times a day (BID) | ORAL | 3 refills | Status: DC

## 2017-05-08 NOTE — Patient Instructions (Signed)
Medication Instructions:  Your physician has recommended you make the following change in your medication:  START aspirin 81 mg daily START carvedilol (coreg) 3.125 mg twice daily  Labwork: Your physician recommends that you return for lab work today: INR, BMP, CBC  Testing/Procedures: You had an EKG today.     [facility] [address] [place] [address] Dept: [phone] Loc: [phone]  Rashod Gougeon  05/08/2017  You are scheduled for a Cardiac Catheterization on Thursday, March 28 with Dr. Glenetta Hew.  1. Please arrive at the [facility] (Main Entrance A) at [facility]: [place], [place] [address] at 10:00 AM (two hours before your procedure to ensure your preparation). Free valet parking service is available.   Special note: Every effort is made to have your procedure done on time. Please understand that emergencies sometimes delay scheduled procedures.  2. Diet: Do not eat or drink anything after midnight prior to your procedure except sips of water to take medications.  3. Labs: None needed.  4. Medication instructions in preparation for your procedure:    Current Outpatient Medications (Cardiovascular):  .  ramipril (ALTACE) 5 MG capsule, Take 5 mg by mouth daily. .  tadalafil (CIALIS) 5 MG tablet, Take 5 mg by mouth daily as needed for erectile dysfunction. .  carvedilol (COREG) 3.125 MG tablet, Take 1 tablet (3.125 mg total) by mouth 2 (two) times daily.  Current Outpatient Medications (Respiratory):  .  mometasone (NASONEX) 50 MCG/ACT nasal spray, Place 2 sprays into the nose daily.  Current Outpatient Medications (Analgesics):  .  aspirin EC 81 MG tablet, Take 1 tablet (81 mg total) by mouth daily.   Current Outpatient Medications (Other):  .  amoxicillin-clavulanate (AUGMENTIN) 875-125 MG tablet, Take 1 tablet by mouth 2 (two)  times daily. Marland Kitchen  buPROPion (WELLBUTRIN SR) 150 MG 12 hr tablet, Take 150 mg by mouth 2 (two) times daily.   On the morning of your procedure, take your Aspirin and any morning medicines NOT listed above.  You may use sips of water.  5. Plan for one night stay--bring personal belongings. 6. Bring a current list of your medications and current insurance cards. 7. You MUST have a responsible person to drive you home. 8. Someone MUST be with you the first 24 hours after you arrive home or your discharge will be delayed. 9. Please wear clothes that are easy to get on and off and wear slip-on shoes.  Thank you for allowing [place] to care for you!   -- [facility]   Follow-Up: Your physician recommends that you schedule a follow-up appointment in: 1 week  Any Other Special Instructions Will Be Listed Below (If Applicable).     If you need a refill on your cardiac medications before your next appointment, please call your pharmacy.

## 2017-05-08 NOTE — Addendum Note (Signed)
Addended by: Austin Miles on: 05/08/2017 09:12 AM   Modules accepted: Orders

## 2017-05-08 NOTE — Progress Notes (Signed)
Cardiology Office Note:    Date:  05/08/2017   ID:  Ian Mcgee, DOB October 17, 1945, MRN [id number]  PCP:  Myer Peer, MD  Cardiologist:  Jenne Campus, MD    Referring MD: Myer Peer, MD   Chief Complaint  Patient presents with  . Follow-up  I have abnormal echocardiogram  History of Present Illness:    Ian Mcgee is a 72 y.o. male with peripheral vascular disease, pacemaker secondary to complete heart block.  Recently he went to his primary care physician for noncardiac related issue he was find to have some extrasystole EKG was done and that prompted echocardiogram.  Echocardiogram showed ejection fraction 40% with anterior wall akinesis.  He was sent back to me.  He denies having any typical cardiac symptoms however he like to play tennis on the regular basis and he noted within a year that he does have some more shortness of breath than usually.  No typical chest pain tightness squeezing pressure burning chest.  He described a few months ago episodes of chest pain lasting for a few minutes he attributed this to esophageal spasm that he gets from time to time.  Otherwise he does not have any typical chest pain tightness squeezing pressure burning chest.  Still very active play sports.  No dizziness no syncope  Past Medical History:  Diagnosis Date  . Complete heart block ([place]) 06/10/2015  . Hyperlipidemia   . Syncope and collapse       Current Medications: Current Meds  Medication Sig  . amoxicillin-clavulanate (AUGMENTIN) 875-125 MG tablet Take 1 tablet by mouth 2 (two) times daily.  Marland Kitchen buPROPion (WELLBUTRIN SR) 150 MG 12 hr tablet Take 150 mg by mouth 2 (two) times daily.  . mometasone (NASONEX) 50 MCG/ACT nasal spray Place 2 sprays into the nose daily.  . ramipril (ALTACE) 5 MG capsule Take 5 mg by mouth daily.  . tadalafil (CIALIS) 5 MG tablet Take 5 mg by mouth daily as needed for erectile dysfunction.     Allergies:   Ciprofloxacin and Crestor  [rosuvastatin calcium]   Social History   Socioeconomic History  . Marital status: Unknown    Spouse name: Not on file  . Number of children: Not on file  . Years of education: Not on file  . Highest education level: Not on file  Occupational History  . Not on file  Social Needs  . Financial resource strain: Not on file  . Food insecurity:    Worry: Not on file    Inability: Not on file  . Transportation needs:    Medical: Not on file    Non-medical: Not on file  Tobacco Use  . Smoking status: Former Smoker    Types: Cigarettes  . Smokeless tobacco: Never Used  Substance and Sexual Activity  . Alcohol use: Yes    Comment: Wine with dinner  . Drug use: Never  . Sexual activity: Not on file  Lifestyle  . Physical activity:    Days per week: Not on file    Minutes per session: Not on file  . Stress: Not on file  Relationships  . Social connections:    Talks on phone: Not on file    Gets together: Not on file    Attends religious service: Not on file    Active member of club or organization: Not on file    Attends meetings of clubs or organizations: Not on file    Relationship status: Not on file  Other Topics Concern  . Not on file  Social History Narrative  . Not on file     Family History: The patient's family history includes CAD in his brother, father, and mother; Healthy in his brother and sister; Hypertension in his mother; Leukemia in his father; Osteoporosis in his mother; Rheumatic fever in his brother. ROS:   Please see the history of present illness.    All 14 point review of systems negative except as described per history of present illness  EKGs/Labs/Other Studies Reviewed:      Recent Labs: No results found for requested labs within last 8760 hours.  Recent Lipid Panel No results found for: CHOL, TRIG, HDL, CHOLHDL, VLDL, LDLCALC, LDLDIRECT  Physical Exam:    VS:  BP 120/64   Pulse 84   Ht 5\' 11"  (1.803 m)   Wt 162 lb (73.5 kg)   SpO2  97%   BMI 22.59 kg/m     Wt Readings from Last 3 Encounters:  05/08/17 162 lb (73.5 kg)     GEN:  Well nourished, well developed in no acute distress HEENT: Normal NECK: No JVD; No carotid bruits LYMPHATICS: No lymphadenopathy CARDIAC: RRR, no murmurs, no rubs, no gallops RESPIRATORY:  Clear to auscultation without rales, wheezing or rhonchi  ABDOMEN: Soft, non-tender, non-distended MUSCULOSKELETAL:  No edema; No deformity  SKIN: Warm and dry LOWER EXTREMITIES: no swelling NEUROLOGIC:  Alert and oriented x 3 PSYCHIATRIC:  Normal affect   ASSESSMENT:    1. Complete heart block ([place])   2. Ischemic cardiomyopathy   3. Dyslipidemia   4. Peripheral vascular disease ([place])    PLAN:    In order of problems listed above:  1. Cardiomyopathy: Ejection fraction 40% with anterior wall akinesis.  This is a new discovery.  I told him that the best way to approach this problem will be to do cardiac catheterization.  The distribution of the lesion based on echocardiogram suggest LAD disease.  Course if he was significant problem over there we may be able to do angioplasty.  However the problem will be if we find out completely occluded artery the question will be should be revascularized this lesion.  If this is the case I think we will need to do some viability study.  There is always possibility that this and he will significant hypokinesis to akinesis related to pacing however this degree of akinesis is some likely related to pacing.  Again cardiac catheterization need to be done to clarify that.  Procedure was explained to him including all risk benefits as well as alternatives.  In the meantime I asked him to start taking baby aspirin.  He is always reluctant to take medication and he did not want to take aspirin because of increased symptoms some bleeding when he scratched himself.  This time he agree.  We also initiated discussion about statin.  He did have some issue with Crestor he does not  like to take this medication he tells me that his cholesterol is normal I told him that into his situation he need to be on statin but he decided to wait until his cardiac catheterization will be done 2. In terms of management of his cardiomyopathy I will initiate carvedilol 3.125 twice daily.  We will continue with ramipril 5 mg daily. 3. Peripheral vascular disease: The future we will do carotid ultrasound. 4. Pacemaker present recently interrogated normal function.   Medication Adjustments/Labs and Tests Ordered: Current medicines are reviewed at length with the  patient today.  Concerns regarding medicines are outlined above.  No orders of the defined types were placed in this encounter.  Medication changes: No orders of the defined types were placed in this encounter.   Signed, Park Liter, MD, [facility] 05/08/2017 8:52 AM    [place]

## 2017-05-08 NOTE — H&P (View-Only) (Signed)
Cardiology Office Note:    Date:  05/08/2017   ID:  [name], DOB 09-06-1945, MRN [id number]  PCP:  Myer Peer, MD  Cardiologist:  Jenne Campus, MD    Referring MD: Myer Peer, MD   Chief Complaint  Patient presents with  . Follow-up  I have abnormal echocardiogram  History of Present Illness:    Safal Halderman is a 72 y.o. male with peripheral vascular disease, pacemaker secondary to complete heart block.  Recently he went to his primary care physician for noncardiac related issue he was find to have some extrasystole EKG was done and that prompted echocardiogram.  Echocardiogram showed ejection fraction 40% with anterior wall akinesis.  He was sent back to me.  He denies having any typical cardiac symptoms however he like to play tennis on the regular basis and he noted within a year that he does have some more shortness of breath than usually.  No typical chest pain tightness squeezing pressure burning chest.  He described a few months ago episodes of chest pain lasting for a few minutes he attributed this to esophageal spasm that he gets from time to time.  Otherwise he does not have any typical chest pain tightness squeezing pressure burning chest.  Still very active play sports.  No dizziness no syncope  Past Medical History:  Diagnosis Date  . Complete heart block ([place]) 06/10/2015  . Hyperlipidemia   . Syncope and collapse       Current Medications: Current Meds  Medication Sig  . amoxicillin-clavulanate (AUGMENTIN) 875-125 MG tablet Take 1 tablet by mouth 2 (two) times daily.  Marland Kitchen buPROPion (WELLBUTRIN SR) 150 MG 12 hr tablet Take 150 mg by mouth 2 (two) times daily.  . mometasone (NASONEX) 50 MCG/ACT nasal spray Place 2 sprays into the nose daily.  . ramipril (ALTACE) 5 MG capsule Take 5 mg by mouth daily.  . tadalafil (CIALIS) 5 MG tablet Take 5 mg by mouth daily as needed for erectile dysfunction.     Allergies:   Ciprofloxacin and Crestor  [rosuvastatin calcium]   Social History   Socioeconomic History  . Marital status: Unknown    Spouse name: Not on file  . Number of children: Not on file  . Years of education: Not on file  . Highest education level: Not on file  Occupational History  . Not on file  Social Needs  . Financial resource strain: Not on file  . Food insecurity:    Worry: Not on file    Inability: Not on file  . Transportation needs:    Medical: Not on file    Non-medical: Not on file  Tobacco Use  . Smoking status: Former Smoker    Types: Cigarettes  . Smokeless tobacco: Never Used  Substance and Sexual Activity  . Alcohol use: Yes    Comment: Wine with dinner  . Drug use: Never  . Sexual activity: Not on file  Lifestyle  . Physical activity:    Days per week: Not on file    Minutes per session: Not on file  . Stress: Not on file  Relationships  . Social connections:    Talks on phone: Not on file    Gets together: Not on file    Attends religious service: Not on file    Active member of club or organization: Not on file    Attends meetings of clubs or organizations: Not on file    Relationship status: Not on file  Other Topics Concern  . Not on file  Social History Narrative  . Not on file     Family History: The patient's family history includes CAD in his brother, father, and mother; Healthy in his brother and sister; Hypertension in his mother; Leukemia in his father; Osteoporosis in his mother; Rheumatic fever in his brother. ROS:   Please see the history of present illness.    All 14 point review of systems negative except as described per history of present illness  EKGs/Labs/Other Studies Reviewed:      Recent Labs: No results found for requested labs within last 8760 hours.  Recent Lipid Panel No results found for: CHOL, TRIG, HDL, CHOLHDL, VLDL, LDLCALC, LDLDIRECT  Physical Exam:    VS:  BP 120/64   Pulse 84   Ht 5\' 11"  (1.803 m)   Wt 162 lb (73.5 kg)   SpO2  97%   BMI 22.59 kg/m     Wt Readings from Last 3 Encounters:  05/08/17 162 lb (73.5 kg)     GEN:  Well nourished, well developed in no acute distress HEENT: Normal NECK: No JVD; No carotid bruits LYMPHATICS: No lymphadenopathy CARDIAC: RRR, no murmurs, no rubs, no gallops RESPIRATORY:  Clear to auscultation without rales, wheezing or rhonchi  ABDOMEN: Soft, non-tender, non-distended MUSCULOSKELETAL:  No edema; No deformity  SKIN: Warm and dry LOWER EXTREMITIES: no swelling NEUROLOGIC:  Alert and oriented x 3 PSYCHIATRIC:  Normal affect   ASSESSMENT:    1. Complete heart block ([place])   2. Ischemic cardiomyopathy   3. Dyslipidemia   4. Peripheral vascular disease ([place])    PLAN:    In order of problems listed above:  1. Cardiomyopathy: Ejection fraction 40% with anterior wall akinesis.  This is a new discovery.  I told him that the best way to approach this problem will be to do cardiac catheterization.  The distribution of the lesion based on echocardiogram suggest LAD disease.  Course if he was significant problem over there we may be able to do angioplasty.  However the problem will be if we find out completely occluded artery the question will be should be revascularized this lesion.  If this is the case I think we will need to do some viability study.  There is always possibility that this and he will significant hypokinesis to akinesis related to pacing however this degree of akinesis is some likely related to pacing.  Again cardiac catheterization need to be done to clarify that.  Procedure was explained to him including all risk benefits as well as alternatives.  In the meantime I asked him to start taking baby aspirin.  He is always reluctant to take medication and he did not want to take aspirin because of increased symptoms some bleeding when he scratched himself.  This time he agree.  We also initiated discussion about statin.  He did have some issue with Crestor he does not  like to take this medication he tells me that his cholesterol is normal I told him that into his situation he need to be on statin but he decided to wait until his cardiac catheterization will be done 2. In terms of management of his cardiomyopathy I will initiate carvedilol 3.125 twice daily.  We will continue with ramipril 5 mg daily. 3. Peripheral vascular disease: The future we will do carotid ultrasound. 4. Pacemaker present recently interrogated normal function.   Medication Adjustments/Labs and Tests Ordered: Current medicines are reviewed at length with the  patient today.  Concerns regarding medicines are outlined above.  No orders of the defined types were placed in this encounter.  Medication changes: No orders of the defined types were placed in this encounter.   Signed, Park Liter, MD, [facility] 05/08/2017 8:52 AM    [place]

## 2017-05-09 ENCOUNTER — Telehealth: Payer: Self-pay | Admitting: *Deleted

## 2017-05-09 LAB — BASIC METABOLIC PANEL
BUN/Creatinine Ratio: 17 (ref 10–24)
BUN: 16 mg/dL (ref 8–27)
CALCIUM: 9.3 mg/dL (ref 8.6–10.2)
CO2: 22 mmol/L (ref 20–29)
Chloride: 99 mmol/L (ref 96–106)
Creatinine, Ser: 0.96 mg/dL (ref 0.76–1.27)
GFR, EST AFRICAN AMERICAN: 92 mL/min/{1.73_m2} (ref >59)
GFR, EST NON AFRICAN AMERICAN: 79 mL/min/{1.73_m2} (ref >59)
Glucose: 83 mg/dL (ref 65–99)
POTASSIUM: 4.6 mmol/L (ref 3.5–5.2)
Sodium: 139 mmol/L (ref 134–144)

## 2017-05-09 LAB — CBC
HEMATOCRIT: 46.5 % (ref 37.5–51.0)
Hemoglobin: 15.6 g/dL (ref 13.0–17.7)
MCH: 32.2 pg (ref 26.6–33.0)
MCHC: 33.5 g/dL (ref 31.5–35.7)
MCV: 96 fL (ref 79–97)
Platelets: 322 10*3/uL (ref 150–379)
RBC: 4.85 x10E6/uL (ref 4.14–5.80)
RDW: 13.2 % (ref 12.3–15.4)
WBC: 7.5 10*3/uL (ref 3.4–10.8)

## 2017-05-09 LAB — PROTIME-INR
INR: 1 (ref 0.8–1.2)
Prothrombin Time: 10.2 s (ref 9.1–12.0)

## 2017-05-09 NOTE — Telephone Encounter (Signed)
Pt contacted pre-catheterization scheduled at [facility] for: Thursday May 10, 2017 12 noon Verified arrival time and place: [place] at: 10 AM Nothing to eat or drink after midnight prior to cath. Verified allergies in Epic. Verified no diabetes medications.   Hold: Cialis 24 hours prior to cath  AM meds can be  taken pre-cath with sip of water including: ASA 81 mg  Confirmed patient has responsible person to drive home post procedure and observe patient for 24 hours: yes

## 2017-05-10 ENCOUNTER — Ambulatory Visit (HOSPITAL_COMMUNITY)
Admission: RE | Disposition: A | Discharge status: Home / Self Care | Payer: Self-pay | Source: Ambulatory Visit | Attending: Cardiology

## 2017-05-10 ENCOUNTER — Ambulatory Visit (HOSPITAL_COMMUNITY)
Admission: RE | Admit: 2017-05-10 | Discharge: 2017-05-10 | Disposition: A | Discharge status: Home / Self Care | Payer: Medicare Other | Source: Ambulatory Visit | Attending: Cardiology | Admitting: Cardiology

## 2017-05-10 DIAGNOSIS — Z87891 Personal history of nicotine dependence: Secondary | ICD-10-CM | POA: Insufficient documentation

## 2017-05-10 DIAGNOSIS — I429 Cardiomyopathy, unspecified: Secondary | ICD-10-CM | POA: Diagnosis present

## 2017-05-10 DIAGNOSIS — E785 Hyperlipidemia, unspecified: Secondary | ICD-10-CM | POA: Diagnosis not present

## 2017-05-10 DIAGNOSIS — I739 Peripheral vascular disease, unspecified: Secondary | ICD-10-CM | POA: Diagnosis not present

## 2017-05-10 DIAGNOSIS — Z79899 Other long term (current) drug therapy: Secondary | ICD-10-CM | POA: Diagnosis not present

## 2017-05-10 DIAGNOSIS — Z881 Allergy status to other antibiotic agents status: Secondary | ICD-10-CM | POA: Diagnosis not present

## 2017-05-10 DIAGNOSIS — I2510 Atherosclerotic heart disease of native coronary artery without angina pectoris: Secondary | ICD-10-CM | POA: Diagnosis not present

## 2017-05-10 DIAGNOSIS — Z8249 Family history of ischemic heart disease and other diseases of the circulatory system: Secondary | ICD-10-CM | POA: Insufficient documentation

## 2017-05-10 DIAGNOSIS — I340 Nonrheumatic mitral (valve) insufficiency: Secondary | ICD-10-CM | POA: Diagnosis not present

## 2017-05-10 DIAGNOSIS — I442 Atrioventricular block, complete: Secondary | ICD-10-CM | POA: Insufficient documentation

## 2017-05-10 DIAGNOSIS — I255 Ischemic cardiomyopathy: Secondary | ICD-10-CM | POA: Insufficient documentation

## 2017-05-10 DIAGNOSIS — I2584 Coronary atherosclerosis due to calcified coronary lesion: Secondary | ICD-10-CM | POA: Insufficient documentation

## 2017-05-10 DIAGNOSIS — Z950 Presence of cardiac pacemaker: Secondary | ICD-10-CM | POA: Insufficient documentation

## 2017-05-10 HISTORY — PX: LEFT HEART CATH AND CORONARY ANGIOGRAPHY: CATH118249

## 2017-05-10 HISTORY — DX: Presence of cardiac pacemaker: Z95.0

## 2017-05-10 SURGERY — LEFT HEART CATH AND CORONARY ANGIOGRAPHY
Anesthesia: LOCAL

## 2017-05-10 MED ORDER — HEPARIN (PORCINE) IN NACL 2-0.9 UNIT/ML-% IJ SOLN
INTRAMUSCULAR | Status: AC | PRN
  Administered 2017-05-10: 500 mL

## 2017-05-10 MED ORDER — SODIUM CHLORIDE 0.9% FLUSH: 3.0000 mL | INTRAVENOUS | Status: DC | PRN

## 2017-05-10 MED ORDER — SODIUM CHLORIDE 0.9 % IV SOLN: INTRAVENOUS | Status: DC

## 2017-05-10 MED ORDER — SODIUM CHLORIDE 0.9 % IV SOLN: 250.0000 mL | INTRAVENOUS | Status: DC | PRN

## 2017-05-10 MED ORDER — VERAPAMIL HCL 2.5 MG/ML IV SOLN
INTRAVENOUS | Status: DC | PRN
  Administered 2017-05-10: 10 mL via INTRA_ARTERIAL

## 2017-05-10 MED ORDER — VERAPAMIL HCL 2.5 MG/ML IV SOLN
INTRAVENOUS | Status: AC
  Filled 2017-05-10: qty 2

## 2017-05-10 MED ORDER — SODIUM CHLORIDE 0.9 % WEIGHT BASED INFUSION
3.0000 mL/kg/h | INTRAVENOUS | Status: AC
  Administered 2017-05-10: 3 mL/kg/h via INTRAVENOUS

## 2017-05-10 MED ORDER — SODIUM CHLORIDE 0.9% FLUSH: 3.0000 mL | Freq: Two times a day (BID) | INTRAVENOUS | Status: DC

## 2017-05-10 MED ORDER — IOHEXOL 350 MG/ML SOLN
INTRAVENOUS | Status: DC | PRN
  Administered 2017-05-10: 180 mL via INTRA_ARTERIAL

## 2017-05-10 MED ORDER — HEPARIN SODIUM (PORCINE) 1000 UNIT/ML IJ SOLN
INTRAMUSCULAR | Status: DC | PRN
  Administered 2017-05-10: 3500 [IU] via INTRAVENOUS

## 2017-05-10 MED ORDER — SODIUM CHLORIDE 0.9 % WEIGHT BASED INFUSION: 1.0000 mL/kg/h | INTRAVENOUS | Status: DC

## 2017-05-10 MED ORDER — ASPIRIN 81 MG PO CHEW: 81.0000 mg | CHEWABLE_TABLET | ORAL | Status: DC

## 2017-05-10 MED ORDER — ACETAMINOPHEN 325 MG PO TABS: 650.0000 mg | ORAL_TABLET | ORAL | Status: DC | PRN

## 2017-05-10 MED ORDER — LIDOCAINE HCL 1 % IJ SOLN
INTRAMUSCULAR | Status: AC
  Filled 2017-05-10: qty 20

## 2017-05-10 MED ORDER — HEPARIN (PORCINE) IN NACL 2-0.9 UNIT/ML-% IJ SOLN
INTRAMUSCULAR | Status: AC
  Filled 2017-05-10: qty 1000

## 2017-05-10 MED ORDER — HEPARIN SODIUM (PORCINE) 1000 UNIT/ML IJ SOLN
INTRAMUSCULAR | Status: AC
  Filled 2017-05-10: qty 1

## 2017-05-10 MED ORDER — LIDOCAINE HCL (PF) 1 % IJ SOLN
INTRAMUSCULAR | Status: DC | PRN
  Administered 2017-05-10: 3 mL

## 2017-05-10 MED ORDER — ONDANSETRON HCL 4 MG/2ML IJ SOLN: 4.0000 mg | Freq: Four times a day (QID) | INTRAMUSCULAR | Status: DC | PRN

## 2017-05-10 SURGICAL SUPPLY — 14 items
CATH EXPO 5F MPA-1 (CATHETERS) ×2 IMPLANT
CATH INFINITI 5 FR AR1 MOD (CATHETERS) ×2 IMPLANT
CATH INFINITI 5FR ANG PIGTAIL (CATHETERS) ×2 IMPLANT
CATH OPTITORQUE TIG 4.0 5F (CATHETERS) ×2 IMPLANT
COVER PRB 48X5XTLSCP FOLD TPE (BAG) ×1 IMPLANT
COVER PROBE 5X48 (BAG) ×1
GLIDESHEATH SLEND A-KIT 6F 22G (SHEATH) ×2 IMPLANT
GUIDEWIRE INQWIRE 1.5J.035X260 (WIRE) ×1 IMPLANT
INQWIRE 1.5J .035X260CM (WIRE) ×2
KIT HEART LEFT (KITS) ×2 IMPLANT
PACK CARDIAC CATHETERIZATION (CUSTOM PROCEDURE TRAY) ×2 IMPLANT
SYR MEDRAD MARK V 150ML (SYRINGE) ×2 IMPLANT
TRANSDUCER W/STOPCOCK (MISCELLANEOUS) ×2 IMPLANT
TUBING CIL FLEX 10 FLL-RA (TUBING) ×2 IMPLANT

## 2017-05-10 NOTE — Interval H&P Note (Signed)
History and Physical Interval Note:  05/10/2017 12:18 PM  Ian Mcgee  has presented today for surgery, with the diagnosis of Chest Pain and Abnormal echo.    The various methods of treatment have been discussed with the patient and family. After consideration of risks, benefits and other options for treatment, the patient has consented to  Procedure(s): LEFT HEART CATH AND CORONARY ANGIOGRAPHY (N/A) with possible PERCUTANEOUS CORONARY INTERVENTION as a surgical intervention .    The patient's history has been reviewed, patient examined, no change in status, stable for surgery.  I have reviewed the patient's chart and labs.  Questions were answered to the patient's satisfaction.    Cath Lab Visit (complete for each Cath Lab visit)  Clinical Evaluation Leading to the Procedure:   ACS: No.  Non-ACS:    Anginal Classification: CCS II  Anti-ischemic medical therapy: Minimal Therapy (1 class of medications)  Non-Invasive Test Results: Intermediate-risk stress test findings: cardiac mortality 1-3%/year - NEWLY REDUCED LVEF WITH REGIONAL WMA.  Prior CABG: No previous CABG   Glenetta Hew

## 2017-05-10 NOTE — Discharge Instructions (Signed)

## 2017-05-11 ENCOUNTER — Encounter (HOSPITAL_COMMUNITY): Payer: Self-pay | Admitting: Cardiology

## 2017-05-11 MED FILL — Lidocaine HCl Local Inj 1%: INTRAMUSCULAR | Qty: 20 | Status: AC

## 2017-05-15 ENCOUNTER — Ambulatory Visit (INDEPENDENT_AMBULATORY_CARE_PROVIDER_SITE_OTHER): Payer: Medicare Other | Admitting: Cardiology

## 2017-05-15 ENCOUNTER — Encounter: Payer: Self-pay | Admitting: Cardiology

## 2017-05-15 VITALS — BP 110/58 | HR 58 | Ht 71.0 in | Wt 164.0 lb

## 2017-05-15 DIAGNOSIS — Z950 Presence of cardiac pacemaker: Secondary | ICD-10-CM

## 2017-05-15 DIAGNOSIS — I739 Peripheral vascular disease, unspecified: Secondary | ICD-10-CM

## 2017-05-15 DIAGNOSIS — I2510 Atherosclerotic heart disease of native coronary artery without angina pectoris: Secondary | ICD-10-CM | POA: Diagnosis not present

## 2017-05-15 DIAGNOSIS — E785 Hyperlipidemia, unspecified: Secondary | ICD-10-CM | POA: Diagnosis not present

## 2017-05-15 DIAGNOSIS — I442 Atrioventricular block, complete: Secondary | ICD-10-CM

## 2017-05-15 DIAGNOSIS — I420 Dilated cardiomyopathy: Secondary | ICD-10-CM | POA: Diagnosis not present

## 2017-05-15 HISTORY — DX: Atherosclerotic heart disease of native coronary artery without angina pectoris: I25.10

## 2017-05-15 MED ORDER — CARVEDILOL 6.25 MG PO TABS: 6.2500 mg | ORAL_TABLET | Freq: Two times a day (BID) | ORAL | 3 refills | Status: DC

## 2017-05-15 MED ORDER — ATORVASTATIN CALCIUM 20 MG PO TABS: 20.0000 mg | ORAL_TABLET | Freq: Every day | ORAL | 11 refills | Status: DC

## 2017-05-15 NOTE — Addendum Note (Signed)
Addended by: Austin Miles on: 05/15/2017 09:17 AM   Modules accepted: Orders

## 2017-05-15 NOTE — Progress Notes (Signed)
Cardiology Office Note:    Date:  05/15/2017   ID:  Ian Mcgee, DOB Dec 27, 1945, MRN [id number]  PCP:  Myer Peer, MD  Cardiologist:  Jenne Campus, MD    Referring MD: Myer Peer, MD   Chief Complaint  Patient presents with  . Follow-up  I had cardiac catheterization  History of Present Illness:    Ian Mcgee is a 72 y.o. male with cardiomyopathy which appears to be nonischemic.  He just had cardiac catheterization which showed ejection fraction 35-45% with coronary artery disease but narrowing up to 50% occluded he does not explain his cardiomyopathy.  Denies having any chest pain tightness squeezing pressure burning chest.  He is New York Heart Association class II.  We will get shortness of breath with exertion.  There is no chest pain.  Long discussion about what to do with the situation.  While he was having cardiac catheterization the issue was brought about dyssynchrony of his heart because of pacing.  And the issue of potentially putting extra lead on the left side of his heart to synchronize his heart and improve function was entertained.  In my opinion on what we need to do is try medical therapy first and maximize it and then potentially talk about resynchronization however he still thinks about doing a by V pacing that I would refer him right away to EP team for consideration of biventricular pacing. The meantime will try to maximize his medical therapy.  I will ask him to double the dose of carvedilol.  He will take 6.25 twice daily.  Will maintain the same dose of ACE inhibitor.  I told him in the future we will add potentially Aldactone.  We may consider Entresto as well. In terms of dyslipidemia: I had a discussion with him for many years he is reluctant to take those medications however he accepted offered to try a statin.  I put him on 20 mg of atorvastatin.  Past Medical History:  Diagnosis Date  . Complete heart block ([place]) 06/10/2015  .  Hyperlipidemia   . Syncope and collapse       Current Medications: Current Meds  Medication Sig  . aspirin EC 81 MG tablet Take 1 tablet (81 mg total) by mouth daily.  Marland Kitchen buPROPion (WELLBUTRIN SR) 150 MG 12 hr tablet Take 150 mg by mouth daily.   . carvedilol (COREG) 3.125 MG tablet Take 1 tablet (3.125 mg total) by mouth 2 (two) times daily.  . ramipril (ALTACE) 5 MG capsule Take 5 mg by mouth daily.  . tadalafil (CIALIS) 5 MG tablet Take 5 mg by mouth daily.      Allergies:   Ciprofloxacin and Crestor [rosuvastatin calcium]   Social History   Socioeconomic History  . Marital status: Married    Spouse name: Not on file  . Number of children: Not on file  . Years of education: Not on file  . Highest education level: Not on file  Occupational History  . Not on file  Social Needs  . Financial resource strain: Not on file  . Food insecurity:    Worry: Not on file    Inability: Not on file  . Transportation needs:    Medical: Not on file    Non-medical: Not on file  Tobacco Use  . Smoking status: Former Smoker    Types: Cigarettes  . Smokeless tobacco: Never Used  Substance and Sexual Activity  . Alcohol use: Yes    Comment:  Wine with dinner  . Drug use: Never  . Sexual activity: Not on file  Lifestyle  . Physical activity:    Days per week: Not on file    Minutes per session: Not on file  . Stress: Not on file  Relationships  . Social connections:    Talks on phone: Not on file    Gets together: Not on file    Attends religious service: Not on file    Active member of club or organization: Not on file    Attends meetings of clubs or organizations: Not on file    Relationship status: Not on file  Other Topics Concern  . Not on file  Social History Narrative  . Not on file     Family History: The patient's family history includes CAD in his brother, father, and mother; Healthy in his brother and sister; Hypertension in his mother; Leukemia in his father;  Osteoporosis in his mother; Rheumatic fever in his brother. ROS:   Please see the history of present illness.    All 14 point review of systems negative except as described per history of present illness  EKGs/Labs/Other Studies Reviewed:      Recent Labs: 05/08/2017: BUN 16; Creatinine, Ser 0.96; Hemoglobin 15.6; Platelets 322; Potassium 4.6; Sodium 139  Recent Lipid Panel No results found for: CHOL, TRIG, HDL, CHOLHDL, VLDL, LDLCALC, LDLDIRECT  Physical Exam:    VS:  BP (!) 110/58   Pulse (!) 58   Ht 5\' 11"  (1.803 m)   Wt 164 lb (74.4 kg)   SpO2 98%   BMI 22.87 kg/m     Wt Readings from Last 3 Encounters:  05/15/17 164 lb (74.4 kg)  05/10/17 158 lb (71.7 kg)  05/08/17 162 lb (73.5 kg)     GEN:  Well nourished, well developed in no acute distress HEENT: Normal NECK: No JVD; No carotid bruits LYMPHATICS: No lymphadenopathy CARDIAC: RRR, no murmurs, no rubs, no gallops RESPIRATORY:  Clear to auscultation without rales, wheezing or rhonchi  ABDOMEN: Soft, non-tender, non-distended MUSCULOSKELETAL:  No edema; No deformity  SKIN: Warm and dry LOWER EXTREMITIES: no swelling NEUROLOGIC:  Alert and oriented x 3 PSYCHIATRIC:  Normal affect   ASSESSMENT:    1. Dilated cardiomyopathy ([place])   2. Complete heart block ([place])   3. Peripheral vascular disease ([place])   4. Cardiac pacemaker   5. Dyslipidemia   6. Coronary artery disease involving native coronary artery of native heart without angina pectoris    PLAN:    In order of problems listed above:  1. Dilated cardiomyopathy with ejection fraction 35-40%.  Gradually increasing dosages of medication.  He will be referred to EP team for consideration of left ventricular lead placement to improve synchronization of his heart.  Hopefully that will help with the size of the heart and also with mitral regurgitation.  In the meantime we will try to maximize his medical therapy. 2. Complete heart block: That being addressed with  pacemaker. 3. Peripheral vascular disease: Noncritical carotid arterial disease.  He accepted therapy with aspirin as well as statin which I will continue. 4. Dyslipidemia: We will initiate atorvastatin 20 mg daily we will check fasting lipid profile in 4-6 weeks. 5. Coronary artery disease but nonobstructive.  Conservative approach with risk factors modifications.  Still very active he want to go and play tennis today however I told him he need to give couple more days off after his cardiac catheterization did not being given a week  since he had his cardiac catheterization to the right radial artery.  He said the procedure was somewhat difficult and lengthy.  Apparently he does have separate orifice to his circumflex artery.  He is also planning to have a trip to [place] and from my cardiac standpoint review the should be no problem.   Medication Adjustments/Labs and Tests Ordered: Current medicines are reviewed at length with the patient today.  Concerns regarding medicines are outlined above.  No orders of the defined types were placed in this encounter.  Medication changes: No orders of the defined types were placed in this encounter.   Signed, Park Liter, MD, [facility] 05/15/2017 9:00 AM    [place]

## 2017-05-15 NOTE — Patient Instructions (Signed)
Medication Instructions:  Your physician has recommended you make the following change in your medication: INCREASE carvedilol (coreg) 6.25 mg twice daily START atorvastatin (lipitor) 20 mg daily  Labwork: None today. We will check your lipid panel at your next visit, so please fast before your appointment.   Testing/Procedures: None  Follow-Up: Your physician recommends that you schedule a follow-up appointment in: 1 month.   Any Other Special Instructions Will Be Listed Below (If Applicable).  You will be contacted to schedule an appointment with electrophysiology.      If you need a refill on your cardiac medications before your next appointment, please call your pharmacy.

## 2017-05-17 ENCOUNTER — Telehealth: Payer: Self-pay | Admitting: Cardiology

## 2017-05-17 NOTE — Telephone Encounter (Signed)
New Message:     Pt states he is returning call

## 2017-05-18 ENCOUNTER — Telehealth: Payer: Self-pay | Admitting: Cardiology

## 2017-05-18 MED ORDER — CARVEDILOL 3.125 MG PO TABS
3.1250 mg | ORAL_TABLET | Freq: Two times a day (BID) | ORAL | 3 refills | Status: DC
Start: 2017-05-18 — End: 2017-06-12

## 2017-05-18 NOTE — Telephone Encounter (Signed)
Patient has notices that he is very drowsy and lack of alertness and feels that he is really fighting to stay awake and thinks it may be from the meds. Please call me. Dr Raliegh Ip stated for patient to call let him know if he noticed any side effects

## 2017-05-18 NOTE — Telephone Encounter (Signed)
Returned patient's call and advised to decrease dose of carvedilol from 6.25 to 3.125 mg twice daily. This dose is what he was taking before his last office visit and he states he did not have the symptoms at that time. Advised patient to call back if his symptoms persisted. Patient verbalized understanding. No further questions.

## 2017-05-29 DIAGNOSIS — I2510 Atherosclerotic heart disease of native coronary artery without angina pectoris: Secondary | ICD-10-CM | POA: Diagnosis not present

## 2017-05-29 DIAGNOSIS — I420 Dilated cardiomyopathy: Secondary | ICD-10-CM | POA: Diagnosis not present

## 2017-05-29 DIAGNOSIS — I442 Atrioventricular block, complete: Secondary | ICD-10-CM | POA: Diagnosis not present

## 2017-05-29 DIAGNOSIS — E785 Hyperlipidemia, unspecified: Secondary | ICD-10-CM | POA: Diagnosis not present

## 2017-05-29 DIAGNOSIS — I739 Peripheral vascular disease, unspecified: Secondary | ICD-10-CM | POA: Diagnosis not present

## 2017-05-29 DIAGNOSIS — Z950 Presence of cardiac pacemaker: Secondary | ICD-10-CM | POA: Diagnosis not present

## 2017-05-29 NOTE — Addendum Note (Signed)
Addended by: Austin Miles on: 05/29/2017 11:16 AM   Modules accepted: Orders

## 2017-05-30 LAB — HEPATIC FUNCTION PANEL
ALK PHOS: 127 IU/L — AB (ref 39–117)
ALT: 21 IU/L (ref 0–44)
AST: 23 IU/L (ref 0–40)
Albumin: 3.6 g/dL (ref 3.5–4.8)
BILIRUBIN, DIRECT: 0.11 mg/dL (ref 0.00–0.40)
Bilirubin Total: 0.4 mg/dL (ref 0.0–1.2)
Total Protein: 6.2 g/dL (ref 6.0–8.5)

## 2017-06-11 DIAGNOSIS — S86112A Strain of other muscle(s) and tendon(s) of posterior muscle group at lower leg level, left leg, initial encounter: Secondary | ICD-10-CM | POA: Diagnosis not present

## 2017-06-12 ENCOUNTER — Encounter

## 2017-06-12 ENCOUNTER — Ambulatory Visit (INDEPENDENT_AMBULATORY_CARE_PROVIDER_SITE_OTHER): Payer: Medicare Other | Admitting: Cardiology

## 2017-06-12 ENCOUNTER — Encounter: Payer: Self-pay | Admitting: Cardiology

## 2017-06-12 VITALS — BP 148/90 | HR 65 | Ht 71.0 in | Wt 160.0 lb

## 2017-06-12 DIAGNOSIS — E785 Hyperlipidemia, unspecified: Secondary | ICD-10-CM

## 2017-06-12 DIAGNOSIS — I428 Other cardiomyopathies: Secondary | ICD-10-CM | POA: Diagnosis not present

## 2017-06-12 DIAGNOSIS — I442 Atrioventricular block, complete: Secondary | ICD-10-CM

## 2017-06-12 DIAGNOSIS — I2510 Atherosclerotic heart disease of native coronary artery without angina pectoris: Secondary | ICD-10-CM

## 2017-06-12 MED ORDER — METOPROLOL SUCCINATE ER 50 MG PO TB24: 50.0000 mg | ORAL_TABLET | Freq: Every day | ORAL | 1 refills | Status: DC

## 2017-06-12 MED ORDER — METOPROLOL SUCCINATE ER 25 MG PO TB24: 25.0000 mg | ORAL_TABLET | Freq: Every day | ORAL | 1 refills | Status: DC

## 2017-06-12 MED ORDER — RAMIPRIL 5 MG PO CAPS: 5.0000 mg | ORAL_CAPSULE | Freq: Two times a day (BID) | ORAL | 1 refills | Status: DC

## 2017-06-12 NOTE — Progress Notes (Signed)
Electrophysiology Office Note   Date:  06/12/2017   ID:  Ian Mcgee, DOB October 10, 1945, MRN [id number]  PCP:  Myer Peer, MD  Cardiologist:  Agustin Cree Primary Electrophysiologist:  Rechel Delosreyes Meredith Leeds, MD    Chief Complaint  Patient presents with  . Pacemaker Check    Dilated cardiomyopathy     History of Present Illness: Ian Mcgee is a 72 y.o. male who is being seen today for the evaluation of dilated cardiomyopathy at the request of Ian Mcgee. Presenting today for electrophysiology evaluation.  He has a history of complete heart block, hyperlipidemia, syncope.  Recent cardiac cath showed nonobstructive coronary disease.  He had an echo with an EF of around 40%.  Cath does not show any reason for his low ejection fraction and wall motion abnormality.  It is most recent cardiology appointment, his carvedilol was doubled.  His ACE inhibitor was continued.     Today, he denies symptoms of palpitations, chest pain, shortness of breath, orthopnea, PND, lower extremity edema, claudication, dizziness, presyncope, syncope, bleeding, or neurologic sequela. The patient is tolerating medications without difficulties.  Does have shortness of breath when he exerts himself.  This mainly occurs with playing tennis.  He continues to be able to walk and hike.   Past Medical History:  Diagnosis Date  . Complete heart block ([place]) 06/10/2015  . Hyperlipidemia   . Syncope and collapse    Past Surgical History:  Procedure Laterality Date  . Basel Cell Cancer    . CATARACT EXTRACTION    . INSERT / REPLACE / REMOVE PACEMAKER    . KNEE SURGERY Right    ACL Reconstruction  . LEFT HEART CATH AND CORONARY ANGIOGRAPHY N/A 05/10/2017   Procedure: LEFT HEART CATH AND CORONARY ANGIOGRAPHY;  Surgeon: [name], MD;  Location: [facility];  Service: Cardiovascular;  Laterality: N/A;  . VASECTOMY       Current Outpatient Medications  Medication Sig Dispense Refill  .  aspirin EC 81 MG tablet Take 1 tablet (81 mg total) by mouth daily. 90 tablet 3  . buPROPion (WELLBUTRIN SR) 150 MG 12 hr tablet Take 150 mg by mouth daily.     . carvedilol (COREG) 3.125 MG tablet Take 1 tablet (3.125 mg total) by mouth 2 (two) times daily. 180 tablet 3  . ramipril (ALTACE) 5 MG capsule Take 5 mg by mouth daily.    . tadalafil (CIALIS) 5 MG tablet Take 5 mg by mouth daily.      No current facility-administered medications for this visit.     Allergies:   Ciprofloxacin and Crestor [rosuvastatin calcium]   Social History:  The patient  reports that he has quit smoking. His smoking use included cigarettes. He has never used smokeless tobacco. He reports that he drinks alcohol. He reports that he does not use drugs.   Family History:  The patient's family history includes CAD in his brother, father, and mother; Healthy in his brother and sister; Hypertension in his mother; Leukemia in his father; Osteoporosis in his mother; Rheumatic fever in his brother.    ROS:  Please see the history of present illness.   Otherwise, review of systems is positive for cough, anxiety, balance problems, dizziness.   All other systems are reviewed and negative.    PHYSICAL EXAM: VS:  BP (!) 148/90   Pulse 65   Ht 5\' 11"  (1.803 m)   Wt 160 lb (72.6 kg)   SpO2 97%  BMI 22.32 kg/m  , BMI Body mass index is 22.32 kg/m. GEN: Well nourished, well developed, in no acute distress  HEENT: normal  Neck: no JVD, carotid bruits, or masses Cardiac: RRR; no murmurs, rubs, or gallops,no edema  Respiratory:  clear to auscultation bilaterally, normal work of breathing GI: soft, nontender, nondistended, + BS MS: no deformity or atrophy  Skin: warm and dry, device pocket is well healed Neuro:  Strength and sensation are intact Psych: euthymic mood, full affect  EKG:  EKG is not ordered today. Personal review of the ekg ordered 05/08/17 shows S sense, V paced  Device interrogation is reviewed today  in detail.  See PaceArt for details.   Recent Labs: 05/08/2017: BUN 16; Creatinine, Ser 0.96; Hemoglobin 15.6; Platelets 322; Potassium 4.6; Sodium 139 05/29/2017: ALT 21    Lipid Panel  No results found for: CHOL, TRIG, HDL, CHOLHDL, VLDL, LDLCALC, LDLDIRECT   Wt Readings from Last 3 Encounters:  06/12/17 160 lb (72.6 kg)  05/15/17 164 lb (74.4 kg)  05/10/17 158 lb (71.7 kg)      Other studies Reviewed: Additional studies/ records that were reviewed today include: LHC 05/10/17  Review of the above records today demonstrates:   There is moderate to severe left ventricular systolic dysfunction. The left ventricular ejection fraction is 35-45% by visual estimate.  LV end diastolic pressure is normal.  There is moderate (3+) mitral regurgitation.  Diffuse moderate calcific disease in the left main-LAD: Ost LM-LAD lesion is 40% stenosed. Prox LAD-1 lesion is 50% stenosed. Prox LAD to Mid LAD lesion is 50% stenosed.  Prox LAD-2 lesion -mild focal ectatic dilation just distal to the takeoff of the diagonal branch  Prox RCA lesion is 65% stenosed. Otherwise diffusely diseased/calcified vessel.  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   Diffuse moderate to severe calcified disease in the RCA as well as LAD.  But no focal lesion to explain regional wall motion normality. Suspect that his reduced ejection fraction is related to pacemaker syndrome.  Consider cardiac resynchronization therapy.  TTE 04/27/17  1.  Moderate left atrial enlargement 2.  EF 40%.  Severely hypokinetic anterior wall, mid distal septum.  Mid to distal inferior wall akinetic. 3.  Mildly dilated RV and RA with normal pulmonary pressures 4.  Aortic sclerosis without stenosis or regurgitation 5.  Moderate mitral regurgitation  ASSESSMENT AND PLAN:  1.  Dilated cardiomyopathy: Ejection  fraction 35 to 40% on most recent echo.  It is possible that his RV pacing and dyssynchrony could be causing  his low ejection fraction.  Discussed LV lead placement.  Risks and benefits were discussed.  Risks include bleeding, tamponade, infection, pneumothorax.  His point, he would prefer to discuss this further with his primary cardiologist.  Of note, his atrial lead has a low impedance and quite a bit of noise.  Would likely replace his atrial lead during the procedure.  Of note, his blood pressure is elevated today.  We Desarae Placide increase his ramipril to 5 mg twice a day.  He is having symptoms of dizziness and has fallen since starting his carvedilol.  We Reef Achterberg stop that and start him on 25 mg of Toprol-XL.  2.  Complete AV block: Status post Saint Jude dual-chamber pacemaker.  3.  Peripheral vascular disease: Noncritical carotid disease.  Plan per primary cardiology.  4.  Hyperlipidemia: Recently started atorvastatin.  5.  Nonobstructive coronary artery disease: Plan for risk factor modification.  Current medicines are reviewed at length with the patient today.   The patient does not have concerns regarding his medicines.  The following changes were made today:  none  Labs/ tests ordered today include:  No orders of the defined types were placed in this encounter.  Case discussed with primary cardiology  Disposition:   FU with Dylan Ruotolo 6 months  Signed, Shimeka Bacot Meredith Leeds, MD  06/12/2017 9:39 AM     [facility] [address] [place] [address] [phone] (office) [phone] (fax)

## 2017-06-12 NOTE — Patient Instructions (Addendum)
Medication Instructions:  Your physician has recommended you make the following change in your medication:  1. STOP Carvedilol 2. START Metoprolol Succinate (Toprol) 25 mg once daily 3. INCREASE Ramipril to 5 mg twice daily  Labwork: None ordered  Testing/Procedures: None ordered  Follow-Up: Your physician wants you to follow-up in: November with Dr. Curt Bears.  You will receive a reminder letter in the mail two months in advance. If you don't receive a letter, please call our office to schedule the follow-up appointment.  * If you need a refill on your cardiac medications before your next appointment, please call your pharmacy.   *Please note that any paperwork needing to be filled out by the provider will need to be addressed at the front desk prior to seeing the provider. Please note that any FMLA, disability or other documents regarding health condition is subject to a $25.00 charge that must be received prior to completion of paperwork in the form of a money order or check.  Thank you for choosing [facility]!!   Trinidad Curet, RN [phone]  Any Other Special Instructions Will Be Listed Below (If Applicable).  Please discuss upgrading your pacemaker with Dr. Joycelyn Rua when you see him this Thursday.  Nurse will follow up with you next week to see if you would like to proceed with device upgrade.

## 2017-06-12 NOTE — Addendum Note (Signed)
Addended by: Stanton Kidney on: 06/12/2017 09:47 AM   Modules accepted: Orders

## 2017-06-14 ENCOUNTER — Ambulatory Visit (INDEPENDENT_AMBULATORY_CARE_PROVIDER_SITE_OTHER): Payer: Medicare Other | Admitting: Cardiology

## 2017-06-14 ENCOUNTER — Encounter: Payer: Self-pay | Admitting: Cardiology

## 2017-06-14 VITALS — BP 90/60 | HR 57 | Ht 71.0 in | Wt 162.1 lb

## 2017-06-14 DIAGNOSIS — Z950 Presence of cardiac pacemaker: Secondary | ICD-10-CM

## 2017-06-14 DIAGNOSIS — I2510 Atherosclerotic heart disease of native coronary artery without angina pectoris: Secondary | ICD-10-CM

## 2017-06-14 DIAGNOSIS — E785 Hyperlipidemia, unspecified: Secondary | ICD-10-CM | POA: Diagnosis not present

## 2017-06-14 DIAGNOSIS — I420 Dilated cardiomyopathy: Secondary | ICD-10-CM

## 2017-06-14 DIAGNOSIS — I442 Atrioventricular block, complete: Secondary | ICD-10-CM | POA: Diagnosis not present

## 2017-06-14 NOTE — Patient Instructions (Signed)
Medication Instructions:  Your physician has recommended you make the following change in your medication: STOP metoprolol succinate 25 mg daily   Labwork: None  Testing/Procedures: You had an EKG today.   Follow-Up: Your physician recommends that you schedule a follow-up appointment in: 1 month.   Any Other Special Instructions Will Be Listed Below (If Applicable).     If you need a refill on your cardiac medications before your next appointment, please call your pharmacy.

## 2017-06-14 NOTE — Progress Notes (Signed)
Cardiology Office Note:    Date:  06/14/2017   ID:  Ian Mcgee, DOB December 14, 1945, MRN [id number]  PCP:  Myer Peer, MD  Cardiologist:  Jenne Campus, MD    Referring MD: Myer Peer, MD   Chief Complaint  Patient presents with  . 1 month follow up  Doing fair, complaining of dizziness  History of Present Illness:    Ian Mcgee is a 72 y.o. male with complete heart block, recently recognized nonischemic cardiomyopathy.  Cardiac catheterization was done he does have disease but nonobstructive.  He does not explain his cardia myopathy.  We try to increase appropriately dosages of medication I switch him from Toprol to carvedilol however he was not able to tolerate it because of dizziness.  We also try to increase dose of ramipril however that caused dizziness.  He actually fell down 3 times and one time he injured his legs when he fell down.  He did not passed out just simply lost his balance and fell down.  This is very unusual to him.  He also did see EP team with consideration of upgrading his device to Bi V pacing.  He comes today to our office to discuss all the seizures.  He decided to proceed with BiV pacing.  We will contact EP team and make arrangements for it.  On top of that there is some issue with his atrial lead that may require to be revised in terms of dizziness he is convinced that this is beta-blocker the does the problem.  Therefore asked him to temporarily for about 2 weeks stop his Toprol-XL see if that make him any better.  We will check orthostatic changes today I will also do EKG make sure were not missing some obvious reasons for his dizziness like a dysfunctional atrial lead.  Past Medical History:  Diagnosis Date  . Complete heart block ([place]) 06/10/2015  . Hyperlipidemia   . Syncope and collapse     Past Surgical History:  Procedure Laterality Date  . Basel Cell Cancer    . CATARACT EXTRACTION    . INSERT / REPLACE / REMOVE PACEMAKER    .  KNEE SURGERY Right    ACL Reconstruction  . LEFT HEART CATH AND CORONARY ANGIOGRAPHY N/A 05/10/2017   Procedure: LEFT HEART CATH AND CORONARY ANGIOGRAPHY;  Surgeon: [name], MD;  Location: [facility];  Service: Cardiovascular;  Laterality: N/A;  . VASECTOMY      Current Medications: Current Meds  Medication Sig  . buPROPion (WELLBUTRIN SR) 150 MG 12 hr tablet Take 150 mg by mouth daily.   . metoprolol succinate (TOPROL XL) 25 MG 24 hr tablet Take 1 tablet (25 mg total) by mouth daily.  . ramipril (ALTACE) 5 MG capsule Take 1 capsule (5 mg total) by mouth 2 (two) times daily.  . tadalafil (CIALIS) 5 MG tablet Take 5 mg by mouth daily.      Allergies:   Ciprofloxacin and Crestor [rosuvastatin calcium]   Social History   Socioeconomic History  . Marital status: Married    Spouse name: Not on file  . Number of children: Not on file  . Years of education: Not on file  . Highest education level: Not on file  Occupational History  . Not on file  Social Needs  . Financial resource strain: Not on file  . Food insecurity:    Worry: Not on file    Inability: Not on file  .  Transportation needs:    Medical: Not on file    Non-medical: Not on file  Tobacco Use  . Smoking status: Former Smoker    Types: Cigarettes  . Smokeless tobacco: Never Used  Substance and Sexual Activity  . Alcohol use: Yes    Comment: Wine with dinner  . Drug use: Never  . Sexual activity: Not on file  Lifestyle  . Physical activity:    Days per week: Not on file    Minutes per session: Not on file  . Stress: Not on file  Relationships  . Social connections:    Talks on phone: Not on file    Gets together: Not on file    Attends religious service: Not on file    Active member of club or organization: Not on file    Attends meetings of clubs or organizations: Not on file    Relationship status: Not on file  Other Topics Concern  . Not on file  Social History Narrative  . Not on  file     Family History: The patient's family history includes CAD in his brother, father, and mother; Healthy in his brother and sister; Hypertension in his mother; Leukemia in his father; Osteoporosis in his mother; Rheumatic fever in his brother. ROS:   Please see the history of present illness.    All 14 point review of systems negative except as described per history of present illness  EKGs/Labs/Other Studies Reviewed:      Recent Labs: 05/08/2017: BUN 16; Creatinine, Ser 0.96; Hemoglobin 15.6; Platelets 322; Potassium 4.6; Sodium 139 05/29/2017: ALT 21  Recent Lipid Panel No results found for: CHOL, TRIG, HDL, CHOLHDL, VLDL, LDLCALC, LDLDIRECT  Physical Exam:    VS:  BP 90/60   Pulse (!) 57   Ht 5\' 11"  (1.803 m)   Wt 162 lb 1.9 oz (73.5 kg)   SpO2 97%   BMI 22.61 kg/m     Wt Readings from Last 3 Encounters:  06/14/17 162 lb 1.9 oz (73.5 kg)  06/12/17 160 lb (72.6 kg)  05/15/17 164 lb (74.4 kg)     GEN:  Well nourished, well developed in no acute distress HEENT: Normal NECK: No JVD; No carotid bruits LYMPHATICS: No lymphadenopathy CARDIAC: RRR, no murmurs, no rubs, no gallops RESPIRATORY:  Clear to auscultation without rales, wheezing or rhonchi  ABDOMEN: Soft, non-tender, non-distended MUSCULOSKELETAL:  No edema; No deformity  SKIN: Warm and dry LOWER EXTREMITIES: no swelling NEUROLOGIC:  Alert and oriented x 3 PSYCHIATRIC:  Normal affect   ASSESSMENT:    1. Dilated cardiomyopathy ([place])   2. Coronary artery disease involving native coronary artery of native heart without angina pectoris   3. Complete heart block ([facility])   4. Cardiac pacemaker   5. Dyslipidemia    PLAN:    In order of problems listed above:  1. Dilated cardiomyopathy unable to tolerate medications.  We will stop temporarily beta-blocker to see if he feels any better in terms of dizziness.  We will continue ACE inhibitor. 2. Coronary artery disease: Nonobstructive.  Ideally need to be on  statin however have difficulty tolerating it I do not want to add any statin right now I do not want to confuse this picture anymore.  This is something that obviously will discuss in the future. 3. Complete heart block: Will proceed with upgrading his device to Bi V pacing 4. Dyslipidemia: Need to be on statin his LDL is 130.  He was unable to tolerate Lipitor.  Will restart some statin but will do it in the future since now he gets some very nonspecific symptoms that need to be clarified.   Medication Adjustments/Labs and Tests Ordered: Current medicines are reviewed at length with the patient today.  Concerns regarding medicines are outlined above.  No orders of the defined types were placed in this encounter.  Medication changes: No orders of the defined types were placed in this encounter.   Signed, Park Liter, MD, [facility] 06/14/2017 9:22 AM    [place]

## 2017-06-18 DIAGNOSIS — R2689 Other abnormalities of gait and mobility: Secondary | ICD-10-CM | POA: Diagnosis not present

## 2017-06-18 DIAGNOSIS — M79605 Pain in left leg: Secondary | ICD-10-CM | POA: Diagnosis not present

## 2017-06-20 ENCOUNTER — Telehealth: Payer: Self-pay | Admitting: Cardiology

## 2017-06-20 NOTE — Telephone Encounter (Signed)
Scheduled venogram & CRT-P upgrade for Monday, 5/13. Reviewed instructions with patient. Post procedure follow up appts made. Patient verbalized understanding and agreeable to plan.

## 2017-06-20 NOTE — Telephone Encounter (Signed)
New Message   Patient is calling to confirm the date for his new pacemaker. Please call to discuss.

## 2017-06-25 ENCOUNTER — Ambulatory Visit (HOSPITAL_COMMUNITY)
Admission: RE | Admit: 2017-06-25 | Discharge: 2017-06-26 | Disposition: A | Discharge status: Home / Self Care | Payer: Medicare Other | Source: Ambulatory Visit | Attending: Cardiology | Admitting: Cardiology

## 2017-06-25 ENCOUNTER — Other Ambulatory Visit: Payer: Self-pay

## 2017-06-25 ENCOUNTER — Encounter (HOSPITAL_COMMUNITY)
Admission: RE | Disposition: A | Discharge status: Home / Self Care | Payer: Self-pay | Source: Ambulatory Visit | Attending: Cardiology

## 2017-06-25 DIAGNOSIS — I428 Other cardiomyopathies: Secondary | ICD-10-CM | POA: Diagnosis not present

## 2017-06-25 DIAGNOSIS — E785 Hyperlipidemia, unspecified: Secondary | ICD-10-CM | POA: Insufficient documentation

## 2017-06-25 DIAGNOSIS — Z79899 Other long term (current) drug therapy: Secondary | ICD-10-CM | POA: Insufficient documentation

## 2017-06-25 DIAGNOSIS — Z888 Allergy status to other drugs, medicaments and biological substances status: Secondary | ICD-10-CM | POA: Diagnosis not present

## 2017-06-25 DIAGNOSIS — I5022 Chronic systolic (congestive) heart failure: Secondary | ICD-10-CM | POA: Diagnosis not present

## 2017-06-25 DIAGNOSIS — Z9889 Other specified postprocedural states: Secondary | ICD-10-CM | POA: Insufficient documentation

## 2017-06-25 DIAGNOSIS — Z881 Allergy status to other antibiotic agents status: Secondary | ICD-10-CM | POA: Insufficient documentation

## 2017-06-25 DIAGNOSIS — I509 Heart failure, unspecified: Secondary | ICD-10-CM | POA: Insufficient documentation

## 2017-06-25 DIAGNOSIS — Z95818 Presence of other cardiac implants and grafts: Secondary | ICD-10-CM

## 2017-06-25 DIAGNOSIS — I447 Left bundle-branch block, unspecified: Secondary | ICD-10-CM | POA: Insufficient documentation

## 2017-06-25 DIAGNOSIS — I429 Cardiomyopathy, unspecified: Secondary | ICD-10-CM | POA: Insufficient documentation

## 2017-06-25 DIAGNOSIS — Z7982 Long term (current) use of aspirin: Secondary | ICD-10-CM | POA: Diagnosis not present

## 2017-06-25 HISTORY — PX: BIV UPGRADE: EP1202

## 2017-06-25 HISTORY — DX: Other cardiomyopathies: I42.8

## 2017-06-25 LAB — CBC
HEMATOCRIT: 43.3 % (ref 39.0–52.0)
Hemoglobin: 14.7 g/dL (ref 13.0–17.0)
MCH: 32.2 pg (ref 26.0–34.0)
MCHC: 33.9 g/dL (ref 30.0–36.0)
MCV: 94.7 fL (ref 78.0–100.0)
Platelets: 241 10*3/uL (ref 150–400)
RBC: 4.57 MIL/uL (ref 4.22–5.81)
RDW: 13.4 % (ref 11.5–15.5)
WBC: 6.3 10*3/uL (ref 4.0–10.5)

## 2017-06-25 LAB — BASIC METABOLIC PANEL
Anion gap: 8 (ref 5–15)
BUN: 17 mg/dL (ref 6–20)
CHLORIDE: 105 mmol/L (ref 101–111)
CO2: 25 mmol/L (ref 22–32)
Calcium: 8.9 mg/dL (ref 8.9–10.3)
Creatinine, Ser: 0.83 mg/dL (ref 0.61–1.24)
GFR calc Af Amer: >60 mL/min (ref >60)
GFR calc non Af Amer: >60 mL/min (ref >60)
GLUCOSE: 85 mg/dL (ref 65–99)
POTASSIUM: 4 mmol/L (ref 3.5–5.1)
Sodium: 138 mmol/L (ref 135–145)

## 2017-06-25 LAB — SURGICAL PCR SCREEN
MRSA, PCR: NEGATIVE
Staphylococcus aureus: NEGATIVE

## 2017-06-25 SURGERY — BIV UPGRADE

## 2017-06-25 MED ORDER — CEFAZOLIN SODIUM-DEXTROSE 1-4 GM/50ML-% IV SOLN
1.0000 g | Freq: Four times a day (QID) | INTRAVENOUS | Status: AC
  Administered 2017-06-25 – 2017-06-26 (×3): 1 g via INTRAVENOUS
  Filled 2017-06-25 (×3): qty 50

## 2017-06-25 MED ORDER — ACETAMINOPHEN 325 MG PO TABS
325.0000 mg | ORAL_TABLET | ORAL | Status: DC | PRN
  Administered 2017-06-25 – 2017-06-26 (×4): 650 mg via ORAL
  Filled 2017-06-25 (×4): qty 2

## 2017-06-25 MED ORDER — ASPIRIN EC 81 MG PO TBEC
81.0000 mg | DELAYED_RELEASE_TABLET | Freq: Every day | ORAL | Status: DC
  Administered 2017-06-26: 81 mg via ORAL
  Filled 2017-06-25: qty 1

## 2017-06-25 MED ORDER — MUPIROCIN 2 % EX OINT
TOPICAL_OINTMENT | CUTANEOUS | Status: AC
  Administered 2017-06-25: 11:00:00
  Filled 2017-06-25: qty 22

## 2017-06-25 MED ORDER — RAMIPRIL 2.5 MG PO CAPS
5.0000 mg | ORAL_CAPSULE | Freq: Two times a day (BID) | ORAL | Status: DC
  Administered 2017-06-25 – 2017-06-26 (×2): 5 mg via ORAL
  Filled 2017-06-25 (×2): qty 2

## 2017-06-25 MED ORDER — HEPARIN (PORCINE) IN NACL 2-0.9 UNITS/ML
INTRAMUSCULAR | Status: AC | PRN
  Administered 2017-06-25: 500 mL

## 2017-06-25 MED ORDER — CEFAZOLIN SODIUM-DEXTROSE 2-4 GM/100ML-% IV SOLN
INTRAVENOUS | Status: AC
  Filled 2017-06-25: qty 100

## 2017-06-25 MED ORDER — SODIUM CHLORIDE 0.9 % IV SOLN
INTRAVENOUS | Status: DC
  Administered 2017-06-25: 11:00:00 via INTRAVENOUS

## 2017-06-25 MED ORDER — LIDOCAINE HCL (PF) 1 % IJ SOLN
INTRAMUSCULAR | Status: AC
  Filled 2017-06-25: qty 60

## 2017-06-25 MED ORDER — BUPROPION HCL ER (SR) 150 MG PO TB12
150.0000 mg | ORAL_TABLET | Freq: Every day | ORAL | Status: DC
  Administered 2017-06-26: 150 mg via ORAL
  Filled 2017-06-25: qty 1

## 2017-06-25 MED ORDER — SODIUM CHLORIDE 0.9 % IV SOLN
80.0000 mg | INTRAVENOUS | Status: AC
  Administered 2017-06-25: 80 mg

## 2017-06-25 MED ORDER — HYDRALAZINE HCL 20 MG/ML IJ SOLN
10.0000 mg | Freq: Once | INTRAMUSCULAR | Status: AC
  Administered 2017-06-25: 10 mg via INTRAVENOUS
  Filled 2017-06-25: qty 1

## 2017-06-25 MED ORDER — IOPAMIDOL (ISOVUE-370) INJECTION 76%
INTRAVENOUS | Status: DC | PRN
  Administered 2017-06-25: 15 mL via INTRAVENOUS
  Administered 2017-06-25: 15 mL

## 2017-06-25 MED ORDER — SODIUM CHLORIDE 0.9 % IV SOLN
INTRAVENOUS | Status: AC
  Filled 2017-06-25: qty 2

## 2017-06-25 MED ORDER — LIDOCAINE HCL (PF) 1 % IJ SOLN
INTRAMUSCULAR | Status: DC | PRN
  Administered 2017-06-25: 60 mL

## 2017-06-25 MED ORDER — ONDANSETRON HCL 4 MG/2ML IJ SOLN: 4.0000 mg | Freq: Four times a day (QID) | INTRAMUSCULAR | Status: DC | PRN

## 2017-06-25 MED ORDER — IOPAMIDOL (ISOVUE-370) INJECTION 76%
INTRAVENOUS | Status: AC
  Filled 2017-06-25: qty 50

## 2017-06-25 MED ORDER — CEFAZOLIN SODIUM-DEXTROSE 2-4 GM/100ML-% IV SOLN
2.0000 g | INTRAVENOUS | Status: AC
  Administered 2017-06-25: 2 g via INTRAVENOUS

## 2017-06-25 MED ORDER — HEPARIN (PORCINE) IN NACL 1000-0.9 UT/500ML-% IV SOLN
INTRAVENOUS | Status: AC
  Filled 2017-06-25: qty 500

## 2017-06-25 MED ORDER — TADALAFIL 5 MG PO TABS
5.0000 mg | ORAL_TABLET | Freq: Every day | ORAL | Status: DC
  Administered 2017-06-26: 5 mg via ORAL
  Filled 2017-06-25: qty 1

## 2017-06-25 SURGICAL SUPPLY — 30 items
BALLN COR SINUS VENO 6F 80CM (BALLOONS) ×1
BALLN COR SINUS VENO 6FR 80 (BALLOONS) ×2
BALLOON COR SINUS VENO 6FR 80 (BALLOONS) ×1 IMPLANT
CABLE SURGICAL S-101-97-12 (CABLE) ×3 IMPLANT
CATH CPS DIRECT 135 DS2C020 (CATHETERS) ×3 IMPLANT
CATH CPS QUART CN DS2N029-65 (CATHETERS) ×3 IMPLANT
CPS IMPLANT KIT 410190 (MISCELLANEOUS) ×3 IMPLANT
DEVICE DISSECT PLASMABLAD 3.0S (MISCELLANEOUS) ×1 IMPLANT
GLIDEWIRE ANGLED NITR .018X260 (WIRE) IMPLANT
KIT ESSENTIALS PG (KITS) ×3 IMPLANT
KIT MICROPUNCTURE NIT STIFF (SHEATH) ×3 IMPLANT
LEAD QUARTET 1458Q-86CM (Lead) ×3 IMPLANT
LEAD TENDRIL SDX 2088TC-52CM (Lead) ×3 IMPLANT
PACEMAKER QUDR ALLR CRT PM3562 (Pacemaker) ×1 IMPLANT
PAD DEFIB LIFELINK (PAD) ×3 IMPLANT
PLASMABLADE 3.0S (MISCELLANEOUS) ×3
PMKR QUADRA ALLURE CRT PM3562 (Pacemaker) ×3 IMPLANT
SHEATH AVANTI 11CM 6FR (SHEATH) ×3 IMPLANT
SHEATH AVANTI 11CM 7FR (SHEATH) ×3 IMPLANT
SHEATH CLASSIC 7F (SHEATH) ×3 IMPLANT
SHEATH CLASSIC 7F 25CM (SHEATH) ×3 IMPLANT
SHEATH CLASSIC 8F (SHEATH) ×3 IMPLANT
SHEATH CLASSIC 9.5F (SHEATH) ×3 IMPLANT
SHEATH WORLEY 9FR 62CM (SHEATH) ×3 IMPLANT
SLITTER UNIVERSAL DS2A003 (MISCELLANEOUS) ×3 IMPLANT
TRAY PACEMAKER INSERTION (PACKS) ×3 IMPLANT
WIRE ACUITY WHISPER EDS 4648 (WIRE) ×3 IMPLANT
WIRE AQUATRAK .035X150 ANG (WIRE) ×3 IMPLANT
WIRE LUGE 182CM (WIRE) ×3 IMPLANT
WIRE MAILMAN 182CM (WIRE) ×6 IMPLANT

## 2017-06-25 NOTE — H&P (Signed)
Ian Mcgee has presented today for surgery, with the diagnosis of CHF.  The various methods of treatment have been discussed with the patient and family. After consideration of risks, benefits and other options for treatment, the patient has consented to  Procedure(s): CRT-P upgrade as a surgical intervention .  Risks include but not limited to bleeding, tamponade, infection, pneumothorax, among others. The patient's history has been reviewed, patient examined, no change in status, stable for surgery.  I have reviewed the patient's chart and labs.  Questions were answered to the patient's satisfaction.    Batoul Limes Curt Bears, MD 06/25/2017 10:09 AM

## 2017-06-25 NOTE — Progress Notes (Signed)
Patient admitted to unit from cath lab after having pacemaker placed. Dry clean, bulky dressing to left upper shoulder area intact with no breakthru bleeding noted.  Pt alert and oriented, wife present.   No distress noted. Pt oriented to room and call bell.   Explained that he is on bedrest until AM with BR priveledges only.  V/S being obtained and recorded as ordered.  Pt reports no pain or discomfort at this time. Dr. Curt Bears contacted for b/p of 151/131, see order for IV hydralazine.

## 2017-06-25 NOTE — Discharge Summary (Addendum)
ELECTROPHYSIOLOGY PROCEDURE DISCHARGE SUMMARY    Patient ID: Ian Mcgee,  MRN: [id number], DOB/AGE: [birth year] 72 y.o.  Admit date: 06/25/2017 Discharge date: 06/26/2017  Primary Care Physician: Myer Peer, MD Primary Cardiologist: Agustin Cree Electrophysiologist: Curt Bears  Primary Discharge Diagnosis:  Complete heart block and NICM s/p CRTP upgrade this admission  Secondary Discharge Diagnosis:  1.  Hyperlipidemia   Allergies  Allergen Reactions  . Ciprofloxacin Other (See Comments)    Bleeding (intolerance)  . Crestor [Rosuvastatin Calcium] Other (See Comments)    Muscle twinges     Procedures This Admission:  1.  Upgrade of a previously implanted pacemaker to CRTP on 06/25/17 by Dr Curt Bears.  See op note for full details. There were no immediate post procedure complications. 2.  CXR on 06/27/17 demonstrated no pneumothorax status post device implantation.   Brief HPI: Ian Mcgee is a 72 y.o. male with complete heart block and NICM.  There is concern that LV dysfunction is related to chronic RV pacing.  Risks, benefits, and alternatives to CRTP upgrade were reviewed with the patient who wished to proceed.   Hospital Course:  The patient was admitted and underwent upgrade to a STJ CRTP with new RA and LV leads. with details as outlined above. He was monitored on telemetry overnight which demonstrated SR with V pacing.  Left chest was without hematoma or ecchymosis.  The device was interrogated and found to be functioning normally.  CXR was obtained and demonstrated no pneumothorax status post device implantation.  Wound care, arm mobility, and restrictions were reviewed with the patient.  The patient was examined and considered stable for discharge to home.   Physical Exam: Vitals:   06/25/17 1945 06/25/17 2156 06/25/17 2332 06/26/17 0359  BP: (!) 159/87 (!) 152/87 132/72 118/78  Pulse: 93 87 88 78  Resp: 18  18 18   Temp:   98 F (36.7 C) 98 F (36.7  C)  TempSrc:   Oral Oral  SpO2: 98% 99% 98% 99%  Weight:    158 lb 9.6 oz (71.9 kg)  Height:        GEN- The patient is well appearing, alert and oriented x 3 today.   HEENT: normocephalic, atraumatic; sclera clear, conjunctiva pink; hearing intact; oropharynx clear; neck supple  Lungs- Clear to ausculation bilaterally, normal work of breathing.  No wheezes, rales, rhonchi Heart- Regular rate and rhythm (paced) GI- soft, non-tender, non-distended, bowel sounds present, no hepatosplenomegaly Extremities- no clubbing, cyanosis, or edema  MS- no significant deformity or atrophy Skin- warm and dry, no rash or lesion, left chest without hematoma/ecchymosis Psych- euthymic mood, full affect Neuro- strength and sensation are intact   Labs:   Lab Results  Component Value Date   WBC 6.3 06/25/2017   HGB 14.7 06/25/2017   HCT 43.3 06/25/2017   MCV 94.7 06/25/2017   PLT 241 06/25/2017    Recent Labs  Lab 06/25/17 1039  NA 138  K 4.0  CL 105  CO2 25  BUN 17  CREATININE 0.83  CALCIUM 8.9  GLUCOSE 85    Discharge Medications:  Allergies as of 06/26/2017      Reactions   Ciprofloxacin Other (See Comments)   Bleeding (intolerance)   Crestor [rosuvastatin Calcium] Other (See Comments)   Muscle twinges      Medication List    TAKE these medications   aspirin EC 81 MG tablet Take 1 tablet (81 mg total) by mouth daily.   buPROPion 150 MG  12 hr tablet Commonly known as:  WELLBUTRIN SR Take 150 mg by mouth daily.   ramipril 5 MG capsule Commonly known as:  ALTACE Take 1 capsule (5 mg total) by mouth 2 (two) times daily.   tadalafil 5 MG tablet Commonly known as:  CIALIS Take 5 mg by mouth daily. For enlarged prostate       Disposition:  Discharge Instructions    Diet - low sodium heart healthy   Complete by:  As directed    Increase activity slowly   Complete by:  As directed      Follow-up Information    [facility] Follow up on 07/05/2017.    Specialty:  Cardiology Why:  at [facility] information: [address] [place] [phone]       Constance Haw, MD Follow up on 10/10/2017.   Specialty:  Cardiology Why:  at 9:30AM Contact information: [place] [address] [phone]           Duration of Discharge Encounter: Greater than 30 minutes including physician time.  Signed, Chanetta Marshall, NP 06/26/2017 8:57 AM  I have seen and examined this patient with Chanetta Marshall.  Agree with above, note added to reflect my findings.  On exam, RRR, no murmurs, lungs clear.  [employer] CRT-P upgrade yesterday.  Chest x-ray and interrogation without issue.  Plan for discharge today with follow-up in device clinic.  Macy Lingenfelter M. Samone Guhl MD 06/26/2017 12:10 PM

## 2017-06-26 ENCOUNTER — Encounter (HOSPITAL_COMMUNITY): Payer: Self-pay | Admitting: Cardiology

## 2017-06-26 ENCOUNTER — Ambulatory Visit (HOSPITAL_COMMUNITY): Payer: Medicare Other

## 2017-06-26 DIAGNOSIS — I447 Left bundle-branch block, unspecified: Secondary | ICD-10-CM | POA: Diagnosis not present

## 2017-06-26 DIAGNOSIS — Z950 Presence of cardiac pacemaker: Secondary | ICD-10-CM | POA: Diagnosis not present

## 2017-06-26 DIAGNOSIS — Z881 Allergy status to other antibiotic agents status: Secondary | ICD-10-CM | POA: Diagnosis not present

## 2017-06-26 DIAGNOSIS — I429 Cardiomyopathy, unspecified: Secondary | ICD-10-CM | POA: Diagnosis not present

## 2017-06-26 DIAGNOSIS — I509 Heart failure, unspecified: Secondary | ICD-10-CM | POA: Diagnosis not present

## 2017-06-26 DIAGNOSIS — Z888 Allergy status to other drugs, medicaments and biological substances status: Secondary | ICD-10-CM | POA: Diagnosis not present

## 2017-06-26 DIAGNOSIS — E785 Hyperlipidemia, unspecified: Secondary | ICD-10-CM | POA: Diagnosis not present

## 2017-06-26 DIAGNOSIS — I428 Other cardiomyopathies: Secondary | ICD-10-CM | POA: Diagnosis not present

## 2017-06-26 NOTE — Progress Notes (Signed)
Pt has orders to be discharged. Discharge instructions given and pt has no additional questions at this time. Medication regimen reviewed and pt educated. Pt verbalized understanding and has no additional questions. Telemetry box removed. IV removed and site in good condition. Pt stable and waiting for transportation.  Shariff Lasky RN 

## 2017-06-26 NOTE — Discharge Instructions (Signed)
° ° °  Supplemental Discharge Instructions for  Pacemaker/Defibrillator Patients  Activity No heavy lifting or vigorous activity with your left/right arm for 6 to 8 weeks.  Do not raise your left/right arm above your head for one week.  Gradually raise your affected arm as drawn below.           __           06/30/17                07/01/17                    07/02/17                   07/03/17  NO DRIVING for   1 week  ; you may begin driving on   0/25/85  .  WOUND CARE - Keep the wound area clean and dry.  Do not get this area wet for one week. No showers for one week; you may shower on  07/03/17   . - The tape/steri-strips on your wound will fall off; do not pull them off.  No bandage is needed on the site.  DO  NOT apply any creams, oils, or ointments to the wound area. - If you notice any drainage or discharge from the wound, any swelling or bruising at the site, or you develop a fever > 101? F after you are discharged home, call the office at once.  Special Instructions - You are still able to use cellular telephones; use the ear opposite the side where you have your pacemaker/defibrillator.  Avoid carrying your cellular phone near your device. - When traveling through airports, show security personnel your identification card to avoid being screened in the metal detectors.  Ask the security personnel to use the hand wand. - Avoid arc welding equipment, MRI testing (magnetic resonance imaging), TENS units (transcutaneous nerve stimulators).  Call the office for questions about other devices. - Avoid electrical appliances that are in poor condition or are not properly grounded. - Microwave ovens are safe to be near or to operate.

## 2017-06-27 ENCOUNTER — Ambulatory Visit (INDEPENDENT_AMBULATORY_CARE_PROVIDER_SITE_OTHER): Payer: Self-pay | Admitting: *Deleted

## 2017-06-27 ENCOUNTER — Telehealth: Payer: Self-pay | Admitting: Cardiology

## 2017-06-27 DIAGNOSIS — I420 Dilated cardiomyopathy: Secondary | ICD-10-CM

## 2017-06-27 NOTE — Telephone Encounter (Signed)
°  1. Has your device fired? no 2. Is you device beeping? no  3. Are you experiencing draining or swelling at device site? no  4. Are you calling to see if we received your device transmission? No, pt is having pain that comes and goes in his shoulder inches away from his device, pt left arm is turning blue gray  5. Have you passed out? no    Please route to [place]

## 2017-06-27 NOTE — Telephone Encounter (Signed)
Pt called and stated that he is having left shoulder pain and that when pain occurs pt also states that arm turns blue, asked pt if he could move his fingers pt stated that he could pt also stated that the pain does not follow any pattern of time or movement, pt stated that  This is his third device and he knows what it normal and what is not. Pt agreeable to apt today at 3:30pm in device clinic to assess.

## 2017-06-27 NOTE — Progress Notes (Signed)
Pt seen d/t complaints of shoulder pain and intermittent discoloration of left arm, left arm. No swelling noted on left arm, no visible discoloration noted. Left arm assessed by JA, informed pt that likely d/t 2 new wires being placed that the body was having to make collateral veins to accommodate blood flow. Pt voiced understanding of this per JA pts wound check to be scheduled on day with Dr. Curt Bears in office to assess left arm.

## 2017-06-29 ENCOUNTER — Encounter (HOSPITAL_COMMUNITY): Payer: Self-pay | Admitting: Cardiology

## 2017-07-02 DIAGNOSIS — R2689 Other abnormalities of gait and mobility: Secondary | ICD-10-CM | POA: Diagnosis not present

## 2017-07-02 DIAGNOSIS — M79605 Pain in left leg: Secondary | ICD-10-CM | POA: Diagnosis not present

## 2017-07-04 ENCOUNTER — Ambulatory Visit (INDEPENDENT_AMBULATORY_CARE_PROVIDER_SITE_OTHER): Payer: Medicare Other | Admitting: *Deleted

## 2017-07-04 DIAGNOSIS — I442 Atrioventricular block, complete: Secondary | ICD-10-CM

## 2017-07-04 DIAGNOSIS — I428 Other cardiomyopathies: Secondary | ICD-10-CM

## 2017-07-04 LAB — CUP PACEART INCLINIC DEVICE CHECK
Battery Remaining Longevity: 92 mo
Battery Voltage: 3.1 V
Brady Statistic RA Percent Paced: 0.04 %
Implantable Lead Implant Date: 20130812
Implantable Lead Implant Date: 20190513
Implantable Lead Location: 753858
Implantable Lead Location: 753860
Lead Channel Pacing Threshold Amplitude: 0.75 V
Lead Channel Pacing Threshold Amplitude: 0.75 V
Lead Channel Pacing Threshold Amplitude: 1.125 V
Lead Channel Pacing Threshold Pulse Width: 0.2 ms
Lead Channel Pacing Threshold Pulse Width: 0.2 ms
Lead Channel Pacing Threshold Pulse Width: 0.6 ms
Lead Channel Sensing Intrinsic Amplitude: 5 mV
Lead Channel Setting Pacing Amplitude: 2.125
Lead Channel Setting Pacing Pulse Width: 0.4 ms
Lead Channel Setting Pacing Pulse Width: 0.6 ms
MDC IDC LEAD IMPLANT DT: 20190513
MDC IDC LEAD LOCATION: 753859
MDC IDC MSMT LEADCHNL LV IMPEDANCE VALUE: 812.5 Ohm
MDC IDC MSMT LEADCHNL LV PACING THRESHOLD AMPLITUDE: 1.125 V
MDC IDC MSMT LEADCHNL RA IMPEDANCE VALUE: 450 Ohm
MDC IDC MSMT LEADCHNL RV IMPEDANCE VALUE: 362.5 Ohm
MDC IDC MSMT LEADCHNL RV PACING THRESHOLD PULSEWIDTH: 0.4 ms
MDC IDC PG IMPLANT DT: 20190513
MDC IDC PG SERIAL: 9431568
MDC IDC SESS DTM: 20190522124210
MDC IDC SET LEADCHNL RA PACING AMPLITUDE: 1.75 V
MDC IDC SET LEADCHNL RV PACING AMPLITUDE: 2.125
MDC IDC SET LEADCHNL RV SENSING SENSITIVITY: 4 mV
MDC IDC STAT BRADY RV PERCENT PACED: 99.75 %

## 2017-07-04 NOTE — Progress Notes (Signed)
Wound check appointment. Steri-strips removed. Wound without redness or edema. Incision edges approximated, wound well healed. Normal device function. Thresholds, sensing, and impedances consistent with implant measurements. Device programmed auto capture programmed. Histogram distribution appropriate for patient and level of activity. No mode switches or high ventricular rates noted. Patient educated about wound care, arm mobility, lifting restrictions. ROV with WC 8/28

## 2017-07-05 ENCOUNTER — Ambulatory Visit: Payer: Medicare Other

## 2017-07-12 DIAGNOSIS — M79605 Pain in left leg: Secondary | ICD-10-CM | POA: Diagnosis not present

## 2017-07-12 DIAGNOSIS — R2689 Other abnormalities of gait and mobility: Secondary | ICD-10-CM | POA: Diagnosis not present

## 2017-07-16 ENCOUNTER — Ambulatory Visit (INDEPENDENT_AMBULATORY_CARE_PROVIDER_SITE_OTHER): Payer: Medicare Other | Admitting: Cardiology

## 2017-07-16 ENCOUNTER — Encounter: Payer: Self-pay | Admitting: Cardiology

## 2017-07-16 VITALS — BP 112/64 | HR 69 | Ht 71.0 in | Wt 162.4 lb

## 2017-07-16 DIAGNOSIS — I739 Peripheral vascular disease, unspecified: Secondary | ICD-10-CM | POA: Diagnosis not present

## 2017-07-16 DIAGNOSIS — I442 Atrioventricular block, complete: Secondary | ICD-10-CM

## 2017-07-16 DIAGNOSIS — E785 Hyperlipidemia, unspecified: Secondary | ICD-10-CM

## 2017-07-16 DIAGNOSIS — Z950 Presence of cardiac pacemaker: Secondary | ICD-10-CM | POA: Diagnosis not present

## 2017-07-16 DIAGNOSIS — I2510 Atherosclerotic heart disease of native coronary artery without angina pectoris: Secondary | ICD-10-CM

## 2017-07-16 DIAGNOSIS — I420 Dilated cardiomyopathy: Secondary | ICD-10-CM

## 2017-07-16 HISTORY — DX: Presence of cardiac pacemaker: Z95.0

## 2017-07-16 NOTE — Patient Instructions (Signed)
Medication Instructions:  Your physician recommends that you continue on your current medications as directed. Please refer to the Current Medication list given to you today.     Labwork: None  Testing/Procedures: None  Follow-Up: Your physician recommends that you schedule a follow-up appointment in: 1 month.   **Please bring all of your medication bottles with you to your next visit.   If you need a refill on your cardiac medications before your next appointment, please call your pharmacy.   Thank you for choosing [facility]! Robyne Peers, RN [phone]

## 2017-07-16 NOTE — Progress Notes (Signed)
Cardiology Office Note:    Date:  07/16/2017   ID:  Ian Mcgee, DOB 10-20-1945, MRN [id number]  PCP:  Myer Peer, MD  Cardiologist:  Jenne Campus, MD    Referring MD: Myer Peer, MD   Chief Complaint  Patient presents with  . Follow-up    1 month flup   Doing better  History of Present Illness:    Ian Mcgee is a 72 y.o. male with nonischemic cardiomyopathy.  Recently he underwent upgrading of his pacemaker device.  He did have dual-chamber device, was identified to have significant cardiomyopathy.  Cardiac catheterization was done showed nonobstructive disease not explaining his cardiomyopathy.  Decision has been made to upgrade his device to Bi V.  He underwent a procedure with no major difficulties.  Still recovering from it feels weak tired and exhausted but otherwise seems to be doing well started walking on the regular basis.  Also sustained injury to his left leg previously which slows him down in terms of exercises.  He discontinue his beta-blocker thinking that beta-blocker can give him some side effects and he said he feels better also off statin because of some side effects.  We discussed today potentially restarting those 2 classes of medication which in my opinion will be beneficial for him however we decided to postpone initiation of those medication for another month until he fully recovers from by V pacing.  He also inquired about potentially discontinuation of aspirin I told him my opinion he should be taking that medication pointed to some bruises that he has of his forearm, I told him that this is a small price to pay for preventing him from having microinfarction and stroke.  He agreed to continue.  He showed me some blood pressure measurements and the situation that his blood pressure will drop this typically happen when he worked outside with a hot environment.  I told him to make sure he drinks plenty of fluids.  Past Medical History:  Diagnosis  Date  . Complete heart block ([place]) 06/10/2015  . Hyperlipidemia   . Syncope and collapse     Past Surgical History:  Procedure Laterality Date  . Basel Cell Cancer    . BIV UPGRADE N/A 06/25/2017   Procedure: BIV UPGRADE;  Surgeon: Constance Haw, MD;  Location: [facility];  Service: Cardiovascular;  Laterality: N/A;  . CATARACT EXTRACTION    . INSERT / REPLACE / REMOVE PACEMAKER    . KNEE SURGERY Right    ACL Reconstruction  . LEFT HEART CATH AND CORONARY ANGIOGRAPHY N/A 05/10/2017   Procedure: LEFT HEART CATH AND CORONARY ANGIOGRAPHY;  Surgeon: [name], MD;  Location: [facility];  Service: Cardiovascular;  Laterality: N/A;  . VASECTOMY      Current Medications: Current Meds  Medication Sig  . aspirin EC 81 MG tablet Take 1 tablet (81 mg total) by mouth daily.  Marland Kitchen buPROPion (WELLBUTRIN SR) 150 MG 12 hr tablet Take 150 mg by mouth daily.   . ramipril (ALTACE) 5 MG capsule Take 1 capsule (5 mg total) by mouth 2 (two) times daily.  . tadalafil (CIALIS) 5 MG tablet Take 5 mg by mouth daily. For enlarged prostate     Allergies:   Ciprofloxacin and Crestor [rosuvastatin calcium]   Social History   Socioeconomic History  . Marital status: Married    Spouse name: Not on file  . Number of children: Not on file  . Years of  education: Not on file  . Highest education level: Not on file  Occupational History  . Not on file  Social Needs  . Financial resource strain: Not on file  . Food insecurity:    Worry: Not on file    Inability: Not on file  . Transportation needs:    Medical: Not on file    Non-medical: Not on file  Tobacco Use  . Smoking status: Former Smoker    Types: Cigarettes  . Smokeless tobacco: Never Used  Substance and Sexual Activity  . Alcohol use: Yes    Comment: Wine with dinner  . Drug use: Never  . Sexual activity: Not on file  Lifestyle  . Physical activity:    Days per week: Not on file    Minutes per session: Not on  file  . Stress: Not on file  Relationships  . Social connections:    Talks on phone: Not on file    Gets together: Not on file    Attends religious service: Not on file    Active member of club or organization: Not on file    Attends meetings of clubs or organizations: Not on file    Relationship status: Not on file  Other Topics Concern  . Not on file  Social History Narrative  . Not on file     Family History: The patient's family history includes CAD in his brother, father, and mother; Healthy in his brother and sister; Hypertension in his mother; Leukemia in his father; Osteoporosis in his mother; Rheumatic fever in his brother. ROS:   Please see the history of present illness.    All 14 point review of systems negative except as described per history of present illness  EKGs/Labs/Other Studies Reviewed:      Recent Labs: 05/29/2017: ALT 21 06/25/2017: BUN 17; Creatinine, Ser 0.83; Hemoglobin 14.7; Platelets 241; Potassium 4.0; Sodium 138  Recent Lipid Panel No results found for: CHOL, TRIG, HDL, CHOLHDL, VLDL, LDLCALC, LDLDIRECT  Physical Exam:    VS:  BP 112/64 (BP Location: Right Arm, Patient Position: Sitting, Cuff Size: Normal)   Pulse 69   Ht 5\' 11"  (1.803 m)   Wt 162 lb 6.4 oz (73.7 kg)   SpO2 99%   BMI 22.65 kg/m     Wt Readings from Last 3 Encounters:  07/16/17 162 lb 6.4 oz (73.7 kg)  06/26/17 158 lb 9.6 oz (71.9 kg)  06/14/17 162 lb 1.9 oz (73.5 kg)     GEN:  Well nourished, well developed in no acute distress HEENT: Normal NECK: No JVD; No carotid bruits LYMPHATICS: No lymphadenopathy CARDIAC: RRR, no murmurs, no rubs, no gallops RESPIRATORY:  Clear to auscultation without rales, wheezing or rhonchi  ABDOMEN: Soft, non-tender, non-distended MUSCULOSKELETAL:  No edema; No deformity  SKIN: Warm and dry LOWER EXTREMITIES: no swelling NEUROLOGIC:  Alert and oriented x 3 PSYCHIATRIC:  Normal affect   ASSESSMENT:    1. Dilated cardiomyopathy  ([place])   2. Complete heart block ([place])   3. Coronary artery disease involving native coronary artery of native heart without angina pectoris   4. Peripheral vascular disease ([place])   5. Dyslipidemia   6. Status post biventricular pacemaker    PLAN:    In order of problems listed above:  1. Dilated cardiomyopathy: Small dose of ACE inhibitor.  So far unable to tolerate beta-blocker hopefully will restart some beta-blocker and next time when I see him which will be in 1 month. 2. Complete heart  block, status post dual-chamber pacemaker implantation and recent upgrade to by V pacing. 3. Coronary artery disease which is noncritical required aspirin as well as statin in my opinion however he is reluctant to restart it now. 4. Peripheral vascular disease the same situation like coronary artery disease will revisit this issue next time when I see him Dyslipidemia: Need to be on statin however is reluctant to go on it as discussed above we will continue this discussion  Medication Adjustments/Labs and Tests Ordered: Current medicines are reviewed at length with the patient today.  Concerns regarding medicines are outlined above.  No orders of the defined types were placed in this encounter.  Medication changes: No orders of the defined types were placed in this encounter.   Signed, Park Liter, MD, [facility] 07/16/2017 9:01 AM    [place]

## 2017-07-18 DIAGNOSIS — R2689 Other abnormalities of gait and mobility: Secondary | ICD-10-CM | POA: Diagnosis not present

## 2017-07-18 DIAGNOSIS — M79605 Pain in left leg: Secondary | ICD-10-CM | POA: Diagnosis not present

## 2017-07-23 DIAGNOSIS — S86112A Strain of other muscle(s) and tendon(s) of posterior muscle group at lower leg level, left leg, initial encounter: Secondary | ICD-10-CM | POA: Diagnosis not present

## 2017-07-31 DIAGNOSIS — R2689 Other abnormalities of gait and mobility: Secondary | ICD-10-CM | POA: Diagnosis not present

## 2017-07-31 DIAGNOSIS — M79605 Pain in left leg: Secondary | ICD-10-CM | POA: Diagnosis not present

## 2017-08-01 DIAGNOSIS — R2689 Other abnormalities of gait and mobility: Secondary | ICD-10-CM | POA: Diagnosis not present

## 2017-08-01 DIAGNOSIS — M79605 Pain in left leg: Secondary | ICD-10-CM | POA: Diagnosis not present

## 2017-08-08 DIAGNOSIS — R2689 Other abnormalities of gait and mobility: Secondary | ICD-10-CM | POA: Diagnosis not present

## 2017-08-08 DIAGNOSIS — M79605 Pain in left leg: Secondary | ICD-10-CM | POA: Diagnosis not present

## 2017-08-15 ENCOUNTER — Other Ambulatory Visit: Payer: Self-pay

## 2017-08-15 ENCOUNTER — Encounter: Payer: Self-pay | Admitting: Cardiology

## 2017-08-15 ENCOUNTER — Ambulatory Visit (INDEPENDENT_AMBULATORY_CARE_PROVIDER_SITE_OTHER): Payer: Medicare Other | Admitting: Cardiology

## 2017-08-15 VITALS — BP 118/80 | Ht 71.0 in | Wt 163.0 lb

## 2017-08-15 DIAGNOSIS — I2510 Atherosclerotic heart disease of native coronary artery without angina pectoris: Secondary | ICD-10-CM

## 2017-08-15 DIAGNOSIS — I442 Atrioventricular block, complete: Secondary | ICD-10-CM | POA: Diagnosis not present

## 2017-08-15 DIAGNOSIS — I420 Dilated cardiomyopathy: Secondary | ICD-10-CM | POA: Diagnosis not present

## 2017-08-15 DIAGNOSIS — I739 Peripheral vascular disease, unspecified: Secondary | ICD-10-CM

## 2017-08-15 DIAGNOSIS — Z950 Presence of cardiac pacemaker: Secondary | ICD-10-CM | POA: Diagnosis not present

## 2017-08-15 DIAGNOSIS — R2689 Other abnormalities of gait and mobility: Secondary | ICD-10-CM | POA: Diagnosis not present

## 2017-08-15 DIAGNOSIS — M79605 Pain in left leg: Secondary | ICD-10-CM | POA: Diagnosis not present

## 2017-08-15 MED ORDER — ROSUVASTATIN CALCIUM 5 MG PO TABS: 5.0000 mg | ORAL_TABLET | Freq: Every day | ORAL | 0 refills | Status: DC

## 2017-08-15 NOTE — Progress Notes (Signed)
Cardiology Office Note:    Date:  08/15/2017   ID:  Ian Mcgee, DOB Oct 30, 1945, MRN [id number]  PCP:  Myer Peer, MD  Cardiologist:  Jenne Campus, MD    Referring MD: Myer Peer, MD   No chief complaint on file. Doing fine  History of Present Illness:    Ian Mcgee is a 72 y.o. male with nonischemic cardiomyopathy status post recent upgrading of his pacemaker to by V device.  Seems to be doing better the problem he has is a issue with his leg is going to rehab in the matter-of-fact today before he came to my office he was in the rehab gradually getting better.  He was able to ride bike he thinks he may feel better with by V device.  I would discussion again involve about his medications.  He is not able to tolerate ACE inhibitor does not want to take any form of beta-blocker.  He said it did make him feel so but he does not want to do it.  However, I was able to convince him to start taking small dose of Crestor he agreed to take 5 mg hopefully every day he will start twice a week that will ramp it up to every other day and eventually every day.  I will schedule him to have him monitor echocardiogram to recheck left ventricular ejection fraction.  If his ejection fraction still diminished and we will continue conversation about increasing dose of ACE inhibitor and may be adding beta-blocker.  Past Medical History:  Diagnosis Date  . Complete heart block ([place]) 06/10/2015  . Hyperlipidemia   . Syncope and collapse     Past Surgical History:  Procedure Laterality Date  . Basel Cell Cancer    . BIV UPGRADE N/A 06/25/2017   Procedure: BIV UPGRADE;  Surgeon: Constance Haw, MD;  Location: [facility];  Service: Cardiovascular;  Laterality: N/A;  . CATARACT EXTRACTION    . INSERT / REPLACE / REMOVE PACEMAKER    . KNEE SURGERY Right    ACL Reconstruction  . LEFT HEART CATH AND CORONARY ANGIOGRAPHY N/A 05/10/2017   Procedure: LEFT HEART CATH AND CORONARY  ANGIOGRAPHY;  Surgeon: [name], MD;  Location: [facility] [facility];  Service: Cardiovascular;  Laterality: N/A;  . VASECTOMY      Current Medications: Current Meds  Medication Sig  . aspirin EC 81 MG tablet Take 1 tablet (81 mg total) by mouth daily.  Marland Kitchen buPROPion (WELLBUTRIN SR) 150 MG 12 hr tablet Take 150 mg by mouth daily.   . ramipril (ALTACE) 5 MG capsule Take 1 capsule (5 mg total) by mouth 2 (two) times daily.  . tadalafil (CIALIS) 5 MG tablet Take 5 mg by mouth daily. For enlarged prostate     Allergies:   Ciprofloxacin and Crestor [rosuvastatin calcium]   Social History   Socioeconomic History  . Marital status: Married    Spouse name: Not on file  . Number of children: Not on file  . Years of education: Not on file  . Highest education level: Not on file  Occupational History  . Not on file  Social Needs  . Financial resource strain: Not on file  . Food insecurity:    Worry: Not on file    Inability: Not on file  . Transportation needs:    Medical: Not on file    Non-medical: Not on file  Tobacco Use  . Smoking status: Former Smoker  Types: Cigarettes  . Smokeless tobacco: Never Used  Substance and Sexual Activity  . Alcohol use: Yes    Comment: Wine with dinner  . Drug use: Never  . Sexual activity: Not on file  Lifestyle  . Physical activity:    Days per week: Not on file    Minutes per session: Not on file  . Stress: Not on file  Relationships  . Social connections:    Talks on phone: Not on file    Gets together: Not on file    Attends religious service: Not on file    Active member of club or organization: Not on file    Attends meetings of clubs or organizations: Not on file    Relationship status: Not on file  Other Topics Concern  . Not on file  Social History Narrative  . Not on file     Family History: The patient's family history includes CAD in his brother, father, and mother; Healthy in his brother and sister; Hypertension  in his mother; Leukemia in his father; Osteoporosis in his mother; Rheumatic fever in his brother. ROS:   Please see the history of present illness.    All 14 point review of systems negative except as described per history of present illness  EKGs/Labs/Other Studies Reviewed:      Recent Labs: 05/29/2017: ALT 21 06/25/2017: BUN 17; Creatinine, Ser 0.83; Hemoglobin 14.7; Platelets 241; Potassium 4.0; Sodium 138  Recent Lipid Panel No results found for: CHOL, TRIG, HDL, CHOLHDL, VLDL, LDLCALC, LDLDIRECT  Physical Exam:    VS:  BP 118/80 (BP Location: Right Arm, Patient Position: Sitting, Cuff Size: Normal)   Ht 5\' 11"  (1.803 m)   Wt 163 lb (73.9 kg)   BMI 22.73 kg/m     Wt Readings from Last 3 Encounters:  08/15/17 163 lb (73.9 kg)  07/16/17 162 lb 6.4 oz (73.7 kg)  06/26/17 158 lb 9.6 oz (71.9 kg)     GEN:  Well nourished, well developed in no acute distress HEENT: Normal NECK: No JVD; No carotid bruits LYMPHATICS: No lymphadenopathy CARDIAC: RRR, no murmurs, no rubs, no gallops RESPIRATORY:  Clear to auscultation without rales, wheezing or rhonchi  ABDOMEN: Soft, non-tender, non-distended MUSCULOSKELETAL:  No edema; No deformity  SKIN: Warm and dry LOWER EXTREMITIES: no swelling NEUROLOGIC:  Alert and oriented x 3 PSYCHIATRIC:  Normal affect   ASSESSMENT:    1. Dilated cardiomyopathy ([place])   2. Complete heart block ([facility])   3. Status post biventricular pacemaker   4. Peripheral vascular disease ([place])   5. Coronary artery disease involving native coronary artery of native heart without angina pectoris    PLAN:    In order of problems listed above:  1. Dilated cardiomyopathy only on ACE inhibitor, refused to take any beta-blocker for now.  Complete heart block: That being addressed with dual-chamber pacemaker  Status post pacemaker implant doing well wound healed completely Coronary artery disease: Nonobstructive will start small dose of statin. Peripheral  vascular disease: Stable again statin will be reinitiated.  I see him back in my office in about 3 months or sooner if he has a problem   Medication Adjustments/Labs and Tests Ordered: Current medicines are reviewed at length with the patient today.  Concerns regarding medicines are outlined above.  No orders of the defined types were placed in this encounter.  Medication changes: No orders of the defined types were placed in this encounter.   Signed, Park Liter, MD, [facility] 08/15/2017  9:25 AM    [facility]

## 2017-08-15 NOTE — Patient Instructions (Signed)
Medication Instructions:  Your physician has recommended you make the following change in your medication:  START crestor 5 mg daily  Labwork: None  Testing/Procedures: Your physician has requested that you have an echocardiogram. Echocardiography is a painless test that uses sound waves to create images of your heart. It provides your doctor with information about the size and shape of your heart and how well your heart's chambers and valves are working. This procedure takes approximately one hour. There are no restrictions for this procedure.  Follow-Up: Your physician recommends that you schedule a follow-up appointment in: 3 months  Any Other Special Instructions Will Be Listed Below (If Applicable).     If you need a refill on your cardiac medications before your next appointment, please call your pharmacy.   [place], RN, BSN  Rosuvastatin Tablets What is this medicine? ROSUVASTATIN (roe SOO va sta tin) is known as a HMG-CoA reductase inhibitor or 'statin'. It lowers cholesterol and triglycerides in the blood. This drug may also reduce the risk of heart attack, stroke, or other health problems in patients with risk factors for heart disease. Diet and lifestyle changes are often used with this drug. This medicine may be used for other purposes; ask your health care provider or pharmacist if you have questions. COMMON BRAND NAME(S): Crestor What should I tell my health care provider before I take this medicine? They need to know if you have any of these conditions: -frequently drink alcoholic beverages -kidney disease -liver disease -muscle aches or weakness -other medical condition -an unusual or allergic reaction to rosuvastatin, other medicines, foods, dyes, or preservatives -pregnant or trying to get pregnant -breast-feeding How should I use this medicine? Take this medicine by mouth with a glass of water. Follow the directions on the prescription  label. Do not cut, crush or chew this medicine. You can take this medicine with or without food. Take your doses at regular intervals. Do not take your medicine more often than directed. Talk to your pediatrician regarding the use of this medicine in children. While this drug may be prescribed for children as young as 18 years old for selected conditions, precautions do apply. Overdosage: If you think you have taken too much of this medicine contact a poison control center or emergency room at once. NOTE: This medicine is only for you. Do not share this medicine with others. What if I miss a dose? If you miss a dose, take it as soon as you can. Do not take 2 doses within 12 hours of each other. If there are less than 12 hours until your next dose, take only that dose. Do not take double or extra doses. What may interact with this medicine? Do not take this medicine with any of the following medications: -herbal medicines like red yeast rice This medicine may also interact with the following medications: -alcohol -antacids containing aluminum hydroxide or magnesium hydroxide -cyclosporine -other medicines for high cholesterol -some medicines for HIV infection -warfarin This list may not describe all possible interactions. Give your health care provider a list of all the medicines, herbs, non-prescription drugs, or dietary supplements you use. Also tell them if you smoke, drink alcohol, or use illegal drugs. Some items may interact with your medicine. What should I watch for while using this medicine? Visit your doctor or health care professional for regular check-ups. You may need regular tests to make sure your liver is working properly. Tell your doctor or health care professional  right away if you get any unexplained muscle pain, tenderness, or weakness, especially if you also have a fever and tiredness. Your doctor or health care professional may tell you to stop taking this medicine if you  develop muscle problems. If your muscle problems do not go away after stopping this medicine, contact your health care professional. This medicine may affect blood sugar levels. If you have diabetes, check with your doctor or health care professional before you change your diet or the dose of your diabetic medicine. Avoid taking antacids containing aluminum, calcium or magnesium within 2 hours of taking this medicine. This drug is only part of a total heart-health program. Your doctor or a dietician can suggest a low-cholesterol and low-fat diet to help. Avoid alcohol and smoking, and keep a proper exercise schedule. Do not use this drug if you are pregnant or breast-feeding. Serious side effects to an unborn child or to an infant are possible. Talk to your doctor or pharmacist for more information. What side effects may I notice from receiving this medicine? Side effects that you should report to your doctor or health care professional as soon as possible: -allergic reactions like skin rash, itching or hives, swelling of the face, lips, or tongue -dark urine -fever -joint pain -muscle cramps, pain -redness, blistering, peeling or loosening of the skin, including inside the mouth -trouble passing urine or change in the amount of urine -unusually weak or tired -yellowing of the eyes or skin Side effects that usually do not require medical attention (report to your doctor or health care professional if they continue or are bothersome): -constipation -heartburn -nausea -stomach gas, pain, upset This list may not describe all possible side effects. Call your doctor for medical advice about side effects. You may report side effects to FDA at 1-800-FDA-1088. Where should I keep my medicine? Keep out of the reach of children. Store at room temperature between 20 and 25 degrees C (68 and 77 degrees F). Keep container tightly closed (protect from moisture). Throw away any unused medicine after the  expiration date. NOTE: This sheet is a summary. It may not cover all possible information. If you have questions about this medicine, talk to your doctor, pharmacist, or health care provider.  2018 Elsevier/Gold Standard (2014-07-16 13:33:08)

## 2017-08-17 ENCOUNTER — Other Ambulatory Visit: Payer: Self-pay | Admitting: Cardiology

## 2017-08-22 DIAGNOSIS — M76899 Other specified enthesopathies of unspecified lower limb, excluding foot: Secondary | ICD-10-CM | POA: Diagnosis not present

## 2017-08-23 DIAGNOSIS — M79605 Pain in left leg: Secondary | ICD-10-CM | POA: Diagnosis not present

## 2017-08-23 DIAGNOSIS — R2689 Other abnormalities of gait and mobility: Secondary | ICD-10-CM | POA: Diagnosis not present

## 2017-08-30 DIAGNOSIS — M79605 Pain in left leg: Secondary | ICD-10-CM | POA: Diagnosis not present

## 2017-08-30 DIAGNOSIS — R2689 Other abnormalities of gait and mobility: Secondary | ICD-10-CM | POA: Diagnosis not present

## 2017-09-07 DIAGNOSIS — R2689 Other abnormalities of gait and mobility: Secondary | ICD-10-CM | POA: Diagnosis not present

## 2017-09-07 DIAGNOSIS — M79605 Pain in left leg: Secondary | ICD-10-CM | POA: Diagnosis not present

## 2017-09-11 ENCOUNTER — Ambulatory Visit (INDEPENDENT_AMBULATORY_CARE_PROVIDER_SITE_OTHER): Payer: Medicare Other | Admitting: *Deleted

## 2017-09-11 ENCOUNTER — Encounter: Payer: Self-pay | Admitting: Cardiology

## 2017-09-11 DIAGNOSIS — I420 Dilated cardiomyopathy: Secondary | ICD-10-CM

## 2017-09-11 NOTE — Progress Notes (Signed)
Remote pacemaker transmission.   

## 2017-09-12 ENCOUNTER — Encounter: Payer: Self-pay | Admitting: Cardiology

## 2017-09-12 ENCOUNTER — Other Ambulatory Visit: Payer: Self-pay | Admitting: *Deleted

## 2017-09-12 MED ORDER — ROSUVASTATIN CALCIUM 5 MG PO TABS: ORAL_TABLET | ORAL | 3 refills | Status: DC

## 2017-09-12 NOTE — Telephone Encounter (Signed)
Refill for crestor sent to [facility] in [place] as requested by patient.

## 2017-09-19 DIAGNOSIS — R2689 Other abnormalities of gait and mobility: Secondary | ICD-10-CM | POA: Diagnosis not present

## 2017-09-19 DIAGNOSIS — M79605 Pain in left leg: Secondary | ICD-10-CM | POA: Diagnosis not present

## 2017-09-21 ENCOUNTER — Ambulatory Visit (INDEPENDENT_AMBULATORY_CARE_PROVIDER_SITE_OTHER): Payer: Medicare Other

## 2017-09-21 ENCOUNTER — Other Ambulatory Visit: Payer: Self-pay

## 2017-09-21 DIAGNOSIS — I420 Dilated cardiomyopathy: Secondary | ICD-10-CM | POA: Diagnosis not present

## 2017-09-21 NOTE — Progress Notes (Signed)
Complete echocardiogram has been performed.  Jimmy Efrem Pitstick RDCS 

## 2017-10-08 DIAGNOSIS — S86112A Strain of other muscle(s) and tendon(s) of posterior muscle group at lower leg level, left leg, initial encounter: Secondary | ICD-10-CM | POA: Diagnosis not present

## 2017-10-10 ENCOUNTER — Ambulatory Visit (INDEPENDENT_AMBULATORY_CARE_PROVIDER_SITE_OTHER): Payer: Medicare Other | Admitting: Cardiology

## 2017-10-10 ENCOUNTER — Encounter: Payer: Self-pay | Admitting: Cardiology

## 2017-10-10 VITALS — BP 126/80 | HR 67 | Ht 71.0 in | Wt 164.0 lb

## 2017-10-10 DIAGNOSIS — I420 Dilated cardiomyopathy: Secondary | ICD-10-CM | POA: Diagnosis not present

## 2017-10-10 DIAGNOSIS — I442 Atrioventricular block, complete: Secondary | ICD-10-CM | POA: Diagnosis not present

## 2017-10-10 DIAGNOSIS — I255 Ischemic cardiomyopathy: Secondary | ICD-10-CM

## 2017-10-10 DIAGNOSIS — Z950 Presence of cardiac pacemaker: Secondary | ICD-10-CM

## 2017-10-10 DIAGNOSIS — E785 Hyperlipidemia, unspecified: Secondary | ICD-10-CM

## 2017-10-10 DIAGNOSIS — I2510 Atherosclerotic heart disease of native coronary artery without angina pectoris: Secondary | ICD-10-CM | POA: Diagnosis not present

## 2017-10-10 DIAGNOSIS — I739 Peripheral vascular disease, unspecified: Secondary | ICD-10-CM

## 2017-10-10 NOTE — Patient Instructions (Addendum)
Medication Instructions:  Your physician recommends that you continue on your current medications as directed. Please refer to the Current Medication list given to you today.  Labwork: None ordered.  Testing/Procedures: None ordered.  Follow-Up: Your physician wants you to follow-up in: 9 months with Dr. Curt Bears.   You will receive a reminder letter in the mail two months in advance. If you don't receive a letter, please call our office to schedule the follow-up appointment.  Remote monitoring is used to monitor your Pacemaker from home. This monitoring reduces the number of office visits required to check your device to one time per year. It allows [place] to keep an eye on the functioning of your device to ensure it is working properly. You are scheduled for a device check from home on 12/11/2017. You may send your transmission at any time that day. If you have a wireless device, the transmission will be sent automatically. After your physician reviews your transmission, you will receive a postcard with your next transmission date.  Any Other Special Instructions Will Be Listed Below (If Applicable).  If you need a refill on your cardiac medications before your next appointment, please call your pharmacy.

## 2017-10-10 NOTE — Progress Notes (Signed)
Electrophysiology Office Note   Date:  10/10/2017   ID:  Ian Mcgee, DOB [birth year], MRN [id number]  PCP:  Ian Peer, MD  Cardiologist:  Ian Mcgee Primary Electrophysiologist:  Ian Benzel Meredith Leeds, MD    No chief complaint on file.    History of Present Illness: Ian Mcgee is a 72 y.o. male who is being seen today for the evaluation of dilated cardiomyopathy at the request of Ian Mcgee. Presenting today for electrophysiology evaluation.  He has a history of complete heart block, hyperlipidemia, syncope.  Recent cardiac cath showed nonobstructive coronary disease.  He had an echo with an EF of around 40%.  Cath does not show any reason for his low ejection fraction and wall motion abnormality.  It is most recent cardiology appointment, his carvedilol was doubled.  His ACE inhibitor was continued.    Today, denies symptoms of palpitations, chest pain, shortness of breath, orthopnea, PND, lower extremity edema, claudication, dizziness, presyncope, syncope, bleeding, or neurologic sequela. The patient is tolerating medications without difficulties.  Overall he is feeling well.  He has had no further episodes of shortness of breath.  He is able to play tennis and exert himself without issue.  His ejection fraction has improved to 50 to 55%.   Past Medical History:  Diagnosis Date  . Cardiac pacemaker 05/10/2017  . Cardiomyopathy ([place]) 05/08/2017  . Complete heart block ([place]) 06/10/2015  . Coronary artery disease 05/15/2017   40% left main, 50% LAD, 50% RCA recent cardiac catheterization from March 2019  . Dyslipidemia 05/08/2017  . Hyperlipidemia   . Nonischemic cardiomyopathy ([place]) 06/25/2017  . Pacemaker reprogramming/check 06/10/2015  . Peripheral vascular disease ([place]) 05/08/2017   Noncritical bilateral carotid arterial disease  . Status post biventricular pacemaker 07/16/2017  . Syncope and collapse    Past Surgical History:  Procedure Laterality Date  . Basel  Cell Cancer    . BIV UPGRADE N/A 06/25/2017   Procedure: BIV UPGRADE;  Surgeon: Constance Haw, MD;  Location: [facility];  Service: Cardiovascular;  Laterality: N/A;  . CATARACT EXTRACTION    . INSERT / REPLACE / REMOVE PACEMAKER    . KNEE SURGERY Right    ACL Reconstruction  . LEFT HEART CATH AND CORONARY ANGIOGRAPHY N/A 05/10/2017   Procedure: LEFT HEART CATH AND CORONARY ANGIOGRAPHY;  Surgeon: [name], MD;  Location: [facility];  Service: Cardiovascular;  Laterality: N/A;  . VASECTOMY       Current Outpatient Medications  Medication Sig Dispense Refill  . buPROPion (WELLBUTRIN SR) 150 MG 12 hr tablet Take 150 mg by mouth daily.     . rosuvastatin (CRESTOR) 5 MG tablet TAKE 1 TABLET BY MOUTH DAILY AT 6:00 PM 90 tablet 3  . tadalafil (CIALIS) 5 MG tablet Take 5 mg by mouth daily. For enlarged prostate    . ramipril (ALTACE) 5 MG capsule Take 1 capsule (5 mg total) by mouth 2 (two) times daily. 180 capsule 1   No current facility-administered medications for this visit.     Allergies:   Ciprofloxacin and Crestor [rosuvastatin calcium]   Social History:  The patient  reports that he has quit smoking. His smoking use included cigarettes. He has never used smokeless tobacco. He reports that he drinks alcohol. He reports that he does not use drugs.   Family History:  The patient's family history includes CAD in his brother, father, and mother; Healthy in his brother and sister; Hypertension in his  mother; Leukemia in his father; Osteoporosis in his mother; Rheumatic fever in his brother.    ROS:  Please see the history of present illness.   Otherwise, review of systems is positive for anxiety, easy bruising.   All other systems are reviewed and negative.   PHYSICAL EXAM: VS:  BP 126/80   Pulse 67   Ht 5\' 11"  (1.803 m)   Wt 164 lb (74.4 kg)   SpO2 98%   BMI 22.87 kg/m  , BMI Body mass index is 22.87 kg/m. GEN: Well nourished, well developed, in no  acute distress  HEENT: normal  Neck: no JVD, carotid bruits, or masses Cardiac: RRR; no murmurs, rubs, or gallops,no edema  Respiratory:  clear to auscultation bilaterally, normal work of breathing GI: soft, nontender, nondistended, + BS MS: no deformity or atrophy  Skin: warm and dry, device site well healed Neuro:  Strength and sensation are intact Psych: euthymic mood, full affect  EKG:  EKG is ordered today. Personal review of the ekg ordered shows sinus rhythm, ventricular paced  Personal review of the device interrogation today. Results in [place]: 05/29/2017: ALT 21 06/25/2017: BUN 17; Creatinine, Ser 0.83; Hemoglobin 14.7; Platelets 241; Potassium 4.0; Sodium 138    Lipid Panel  No results found for: CHOL, TRIG, HDL, CHOLHDL, VLDL, LDLCALC, LDLDIRECT   Wt Readings from Last 3 Encounters:  10/10/17 164 lb (74.4 kg)  08/15/17 163 lb (73.9 kg)  07/16/17 162 lb 6.4 oz (73.7 kg)      Other studies Reviewed: Additional studies/ records that were reviewed today include: LHC 09/21/17 Review of the above records today demonstrates:  - Left ventricle: The cavity size was normal. Systolic function was   normal. The estimated ejection fraction was in the range of 50%   to 55%. Wall motion was normal; there were no regional wall   motion abnormalities. Doppler parameters are consistent with   abnormal left ventricular relaxation (grade 1 diastolic   dysfunction). - Mitral valve: There was mild regurgitation. - Left atrium: The atrium was mildly dilated.   LHC 05/10/17 Diffuse moderate to severe calcified disease in the RCA as well as LAD.  But no focal lesion to explain regional wall motion normality. Suspect that his reduced ejection fraction is related to pacemaker syndrome.  Consider cardiac resynchronization therapy.  TTE 04/27/17  1.  Moderate left atrial enlargement 2.  EF 40%.  Severely hypokinetic anterior wall, mid distal septum.  Mid to distal  inferior wall akinetic. 3.  Mildly dilated RV and RA with normal pulmonary pressures 4.  Aortic sclerosis without stenosis or regurgitation 5.  Moderate mitral regurgitation  ASSESSMENT AND PLAN:  1.  Dilated cardiomyopathy: Action fraction was 35 to 40%.  He had a by V pacemaker upgrade.  His repeat ejection fraction is now up to 50 to 55%.  He is feeling well and is able to exercise.  No changes at this time.  2.  Complete AV block: Status post Rachel by V pacemaker.  Device functioning appropriately.  No changes.  3.  Peripheral vascular disease: Noncritical carotid disease.  Per primary cardiology.  4.  Hyperlipidemia: Per primary cardiology  5.  Nonobstructive coronary artery disease: Risk factor modification per primary cardiology.  Current medicines are reviewed at length with the patient today.   The patient does not have concerns regarding his medicines.  The following changes were made today: None  Labs/ tests ordered today include:  Orders Placed This  Encounter  Procedures  . EKG 12-Lead    Disposition:   FU with Vitaly Wanat 9 months  Signed, Serria Sloma Meredith Leeds, MD  10/10/2017 10:07 AM     [facility] [address] [place] [address] [phone] (office) [phone] (fax)

## 2017-10-24 LAB — CUP PACEART REMOTE DEVICE CHECK
Implantable Lead Implant Date: 20130812
Implantable Lead Location: 753858
Implantable Lead Location: 753860
MDC IDC LEAD IMPLANT DT: 20190513
MDC IDC LEAD IMPLANT DT: 20190513
MDC IDC LEAD LOCATION: 753859
MDC IDC PG IMPLANT DT: 20190513
MDC IDC PG SERIAL: 9431568
MDC IDC SESS DTM: 20190911071543

## 2017-12-11 ENCOUNTER — Telehealth: Payer: Self-pay | Admitting: Cardiology

## 2017-12-11 ENCOUNTER — Encounter: Payer: Medicare Other | Admitting: *Deleted

## 2017-12-11 DIAGNOSIS — Z23 Encounter for immunization: Secondary | ICD-10-CM | POA: Diagnosis not present

## 2017-12-11 DIAGNOSIS — Z0000 Encounter for general adult medical examination without abnormal findings: Secondary | ICD-10-CM | POA: Diagnosis not present

## 2017-12-11 DIAGNOSIS — I420 Dilated cardiomyopathy: Secondary | ICD-10-CM | POA: Diagnosis not present

## 2017-12-11 DIAGNOSIS — I739 Peripheral vascular disease, unspecified: Secondary | ICD-10-CM | POA: Diagnosis not present

## 2017-12-11 DIAGNOSIS — Z1331 Encounter for screening for depression: Secondary | ICD-10-CM | POA: Diagnosis not present

## 2017-12-11 NOTE — Telephone Encounter (Signed)
LMOVM reminding pt to send remote transmission.   

## 2017-12-12 ENCOUNTER — Encounter: Payer: Self-pay | Admitting: Cardiology

## 2017-12-12 DIAGNOSIS — Z125 Encounter for screening for malignant neoplasm of prostate: Secondary | ICD-10-CM | POA: Diagnosis not present

## 2017-12-12 DIAGNOSIS — Z79899 Other long term (current) drug therapy: Secondary | ICD-10-CM | POA: Diagnosis not present

## 2017-12-12 DIAGNOSIS — E782 Mixed hyperlipidemia: Secondary | ICD-10-CM | POA: Diagnosis not present

## 2017-12-17 ENCOUNTER — Ambulatory Visit (INDEPENDENT_AMBULATORY_CARE_PROVIDER_SITE_OTHER): Payer: Medicare Other | Admitting: *Deleted

## 2017-12-17 DIAGNOSIS — I442 Atrioventricular block, complete: Secondary | ICD-10-CM

## 2017-12-17 DIAGNOSIS — I6523 Occlusion and stenosis of bilateral carotid arteries: Secondary | ICD-10-CM | POA: Diagnosis not present

## 2017-12-17 NOTE — Progress Notes (Signed)
Remote pacemaker transmission.   

## 2017-12-20 ENCOUNTER — Encounter: Payer: Self-pay | Admitting: Cardiology

## 2017-12-23 ENCOUNTER — Other Ambulatory Visit: Payer: Self-pay | Admitting: Cardiology

## 2018-01-02 LAB — CUP PACEART INCLINIC DEVICE CHECK
Date Time Interrogation Session: 20191120153912
Implantable Lead Implant Date: 20130812
Implantable Lead Implant Date: 20190513
Implantable Lead Location: 753858
Implantable Pulse Generator Implant Date: 20190513
MDC IDC LEAD IMPLANT DT: 20190513
MDC IDC LEAD LOCATION: 753859
MDC IDC LEAD LOCATION: 753860
Pulse Gen Serial Number: 9431568

## 2018-02-17 LAB — CUP PACEART REMOTE DEVICE CHECK
Date Time Interrogation Session: 20200105142830
Implantable Lead Implant Date: 20130812
Implantable Lead Implant Date: 20190513
Implantable Lead Implant Date: 20190513
Implantable Lead Location: 753858
Implantable Lead Location: 753859
Implantable Pulse Generator Implant Date: 20190513
MDC IDC LEAD LOCATION: 753860
Pulse Gen Serial Number: 9431568

## 2018-03-05 DIAGNOSIS — M1712 Unilateral primary osteoarthritis, left knee: Secondary | ICD-10-CM | POA: Diagnosis not present

## 2018-03-18 ENCOUNTER — Ambulatory Visit (INDEPENDENT_AMBULATORY_CARE_PROVIDER_SITE_OTHER): Payer: Medicare Other

## 2018-03-18 DIAGNOSIS — I442 Atrioventricular block, complete: Secondary | ICD-10-CM

## 2018-03-21 LAB — CUP PACEART REMOTE DEVICE CHECK
Date Time Interrogation Session: 20200206061952
Implantable Lead Implant Date: 20130812
Implantable Lead Implant Date: 20190513
Implantable Lead Implant Date: 20190513
Implantable Lead Location: 753858
Implantable Lead Location: 753860
Implantable Pulse Generator Implant Date: 20190513
MDC IDC LEAD LOCATION: 753859
Pulse Gen Serial Number: 9431568

## 2018-03-27 NOTE — Progress Notes (Signed)
Remote pacemaker transmission.   

## 2018-06-24 ENCOUNTER — Other Ambulatory Visit: Payer: Self-pay

## 2018-06-24 ENCOUNTER — Ambulatory Visit (INDEPENDENT_AMBULATORY_CARE_PROVIDER_SITE_OTHER): Payer: Medicare Other | Admitting: *Deleted

## 2018-06-24 DIAGNOSIS — I442 Atrioventricular block, complete: Secondary | ICD-10-CM

## 2018-06-24 DIAGNOSIS — I420 Dilated cardiomyopathy: Secondary | ICD-10-CM

## 2018-06-25 LAB — CUP PACEART REMOTE DEVICE CHECK
Date Time Interrogation Session: 20200512104938
Implantable Lead Implant Date: 20130812
Implantable Lead Implant Date: 20190513
Implantable Lead Implant Date: 20190513
Implantable Lead Location: 753858
Implantable Lead Location: 753859
Implantable Lead Location: 753860
Implantable Pulse Generator Implant Date: 20190513
Pulse Gen Serial Number: 9431568

## 2018-07-04 NOTE — Progress Notes (Signed)
Remote pacemaker transmission.   

## 2018-08-01 DIAGNOSIS — F988 Other specified behavioral and emotional disorders with onset usually occurring in childhood and adolescence: Secondary | ICD-10-CM | POA: Diagnosis not present

## 2018-08-01 DIAGNOSIS — N400 Enlarged prostate without lower urinary tract symptoms: Secondary | ICD-10-CM | POA: Diagnosis not present

## 2018-08-01 DIAGNOSIS — Z6822 Body mass index (BMI) 22.0-22.9, adult: Secondary | ICD-10-CM | POA: Diagnosis not present

## 2018-09-12 DIAGNOSIS — S76311A Strain of muscle, fascia and tendon of the posterior muscle group at thigh level, right thigh, initial encounter: Secondary | ICD-10-CM | POA: Diagnosis not present

## 2018-09-14 ENCOUNTER — Other Ambulatory Visit: Payer: Self-pay | Admitting: Cardiology

## 2018-09-16 ENCOUNTER — Other Ambulatory Visit: Payer: Self-pay | Admitting: Cardiology

## 2018-09-16 DIAGNOSIS — S76311A Strain of muscle, fascia and tendon of the posterior muscle group at thigh level, right thigh, initial encounter: Secondary | ICD-10-CM | POA: Diagnosis not present

## 2018-09-16 DIAGNOSIS — M25551 Pain in right hip: Secondary | ICD-10-CM | POA: Diagnosis not present

## 2018-09-16 DIAGNOSIS — R2689 Other abnormalities of gait and mobility: Secondary | ICD-10-CM | POA: Diagnosis not present

## 2018-09-18 DIAGNOSIS — D485 Neoplasm of uncertain behavior of skin: Secondary | ICD-10-CM | POA: Diagnosis not present

## 2018-09-19 DIAGNOSIS — M25551 Pain in right hip: Secondary | ICD-10-CM | POA: Diagnosis not present

## 2018-09-19 DIAGNOSIS — R2689 Other abnormalities of gait and mobility: Secondary | ICD-10-CM | POA: Diagnosis not present

## 2018-09-19 DIAGNOSIS — S76311A Strain of muscle, fascia and tendon of the posterior muscle group at thigh level, right thigh, initial encounter: Secondary | ICD-10-CM | POA: Diagnosis not present

## 2018-09-19 DIAGNOSIS — D225 Melanocytic nevi of trunk: Secondary | ICD-10-CM | POA: Diagnosis not present

## 2018-09-23 ENCOUNTER — Ambulatory Visit (INDEPENDENT_AMBULATORY_CARE_PROVIDER_SITE_OTHER): Payer: Medicare Other | Admitting: *Deleted

## 2018-09-23 DIAGNOSIS — S76311A Strain of muscle, fascia and tendon of the posterior muscle group at thigh level, right thigh, initial encounter: Secondary | ICD-10-CM | POA: Diagnosis not present

## 2018-09-23 DIAGNOSIS — I442 Atrioventricular block, complete: Secondary | ICD-10-CM | POA: Diagnosis not present

## 2018-09-23 DIAGNOSIS — M25551 Pain in right hip: Secondary | ICD-10-CM | POA: Diagnosis not present

## 2018-09-23 DIAGNOSIS — R2689 Other abnormalities of gait and mobility: Secondary | ICD-10-CM | POA: Diagnosis not present

## 2018-09-23 LAB — CUP PACEART REMOTE DEVICE CHECK
Date Time Interrogation Session: 20200810184645
Implantable Lead Implant Date: 20130812
Implantable Lead Implant Date: 20190513
Implantable Lead Implant Date: 20190513
Implantable Lead Location: 753858
Implantable Lead Location: 753859
Implantable Lead Location: 753860
Implantable Pulse Generator Implant Date: 20190513
Pulse Gen Serial Number: 9431568

## 2018-09-26 DIAGNOSIS — S76311A Strain of muscle, fascia and tendon of the posterior muscle group at thigh level, right thigh, initial encounter: Secondary | ICD-10-CM | POA: Diagnosis not present

## 2018-09-26 DIAGNOSIS — M25551 Pain in right hip: Secondary | ICD-10-CM | POA: Diagnosis not present

## 2018-09-26 DIAGNOSIS — R2689 Other abnormalities of gait and mobility: Secondary | ICD-10-CM | POA: Diagnosis not present

## 2018-09-30 DIAGNOSIS — R2689 Other abnormalities of gait and mobility: Secondary | ICD-10-CM | POA: Diagnosis not present

## 2018-09-30 DIAGNOSIS — M25551 Pain in right hip: Secondary | ICD-10-CM | POA: Diagnosis not present

## 2018-09-30 DIAGNOSIS — S76311A Strain of muscle, fascia and tendon of the posterior muscle group at thigh level, right thigh, initial encounter: Secondary | ICD-10-CM | POA: Diagnosis not present

## 2018-10-02 ENCOUNTER — Encounter: Payer: Self-pay | Admitting: Cardiology

## 2018-10-02 NOTE — Progress Notes (Signed)
Remote pacemaker transmission.   

## 2018-10-03 DIAGNOSIS — R2689 Other abnormalities of gait and mobility: Secondary | ICD-10-CM | POA: Diagnosis not present

## 2018-10-03 DIAGNOSIS — S76311A Strain of muscle, fascia and tendon of the posterior muscle group at thigh level, right thigh, initial encounter: Secondary | ICD-10-CM | POA: Diagnosis not present

## 2018-10-03 DIAGNOSIS — M25551 Pain in right hip: Secondary | ICD-10-CM | POA: Diagnosis not present

## 2018-10-07 DIAGNOSIS — M25551 Pain in right hip: Secondary | ICD-10-CM | POA: Diagnosis not present

## 2018-10-07 DIAGNOSIS — R2689 Other abnormalities of gait and mobility: Secondary | ICD-10-CM | POA: Diagnosis not present

## 2018-10-07 DIAGNOSIS — S76311A Strain of muscle, fascia and tendon of the posterior muscle group at thigh level, right thigh, initial encounter: Secondary | ICD-10-CM | POA: Diagnosis not present

## 2018-10-10 DIAGNOSIS — R2689 Other abnormalities of gait and mobility: Secondary | ICD-10-CM | POA: Diagnosis not present

## 2018-10-10 DIAGNOSIS — S76311A Strain of muscle, fascia and tendon of the posterior muscle group at thigh level, right thigh, initial encounter: Secondary | ICD-10-CM | POA: Diagnosis not present

## 2018-10-10 DIAGNOSIS — M25551 Pain in right hip: Secondary | ICD-10-CM | POA: Diagnosis not present

## 2018-10-17 DIAGNOSIS — R2689 Other abnormalities of gait and mobility: Secondary | ICD-10-CM | POA: Diagnosis not present

## 2018-10-17 DIAGNOSIS — M25551 Pain in right hip: Secondary | ICD-10-CM | POA: Diagnosis not present

## 2018-10-17 DIAGNOSIS — S76311A Strain of muscle, fascia and tendon of the posterior muscle group at thigh level, right thigh, initial encounter: Secondary | ICD-10-CM | POA: Diagnosis not present

## 2018-10-22 ENCOUNTER — Other Ambulatory Visit: Payer: Self-pay | Admitting: Cardiology

## 2018-10-22 DIAGNOSIS — S76311A Strain of muscle, fascia and tendon of the posterior muscle group at thigh level, right thigh, initial encounter: Secondary | ICD-10-CM | POA: Diagnosis not present

## 2018-10-22 IMAGING — CR DG CHEST 2V
2 series · 2 of 2 positions shown · non-contrast
Comparison: 05/08/2017 and earlier.

CLINICAL DATA: 71-year-old male status post pacemaker revision.

EXAM:
CHEST - 2 VIEW

[chest pa]
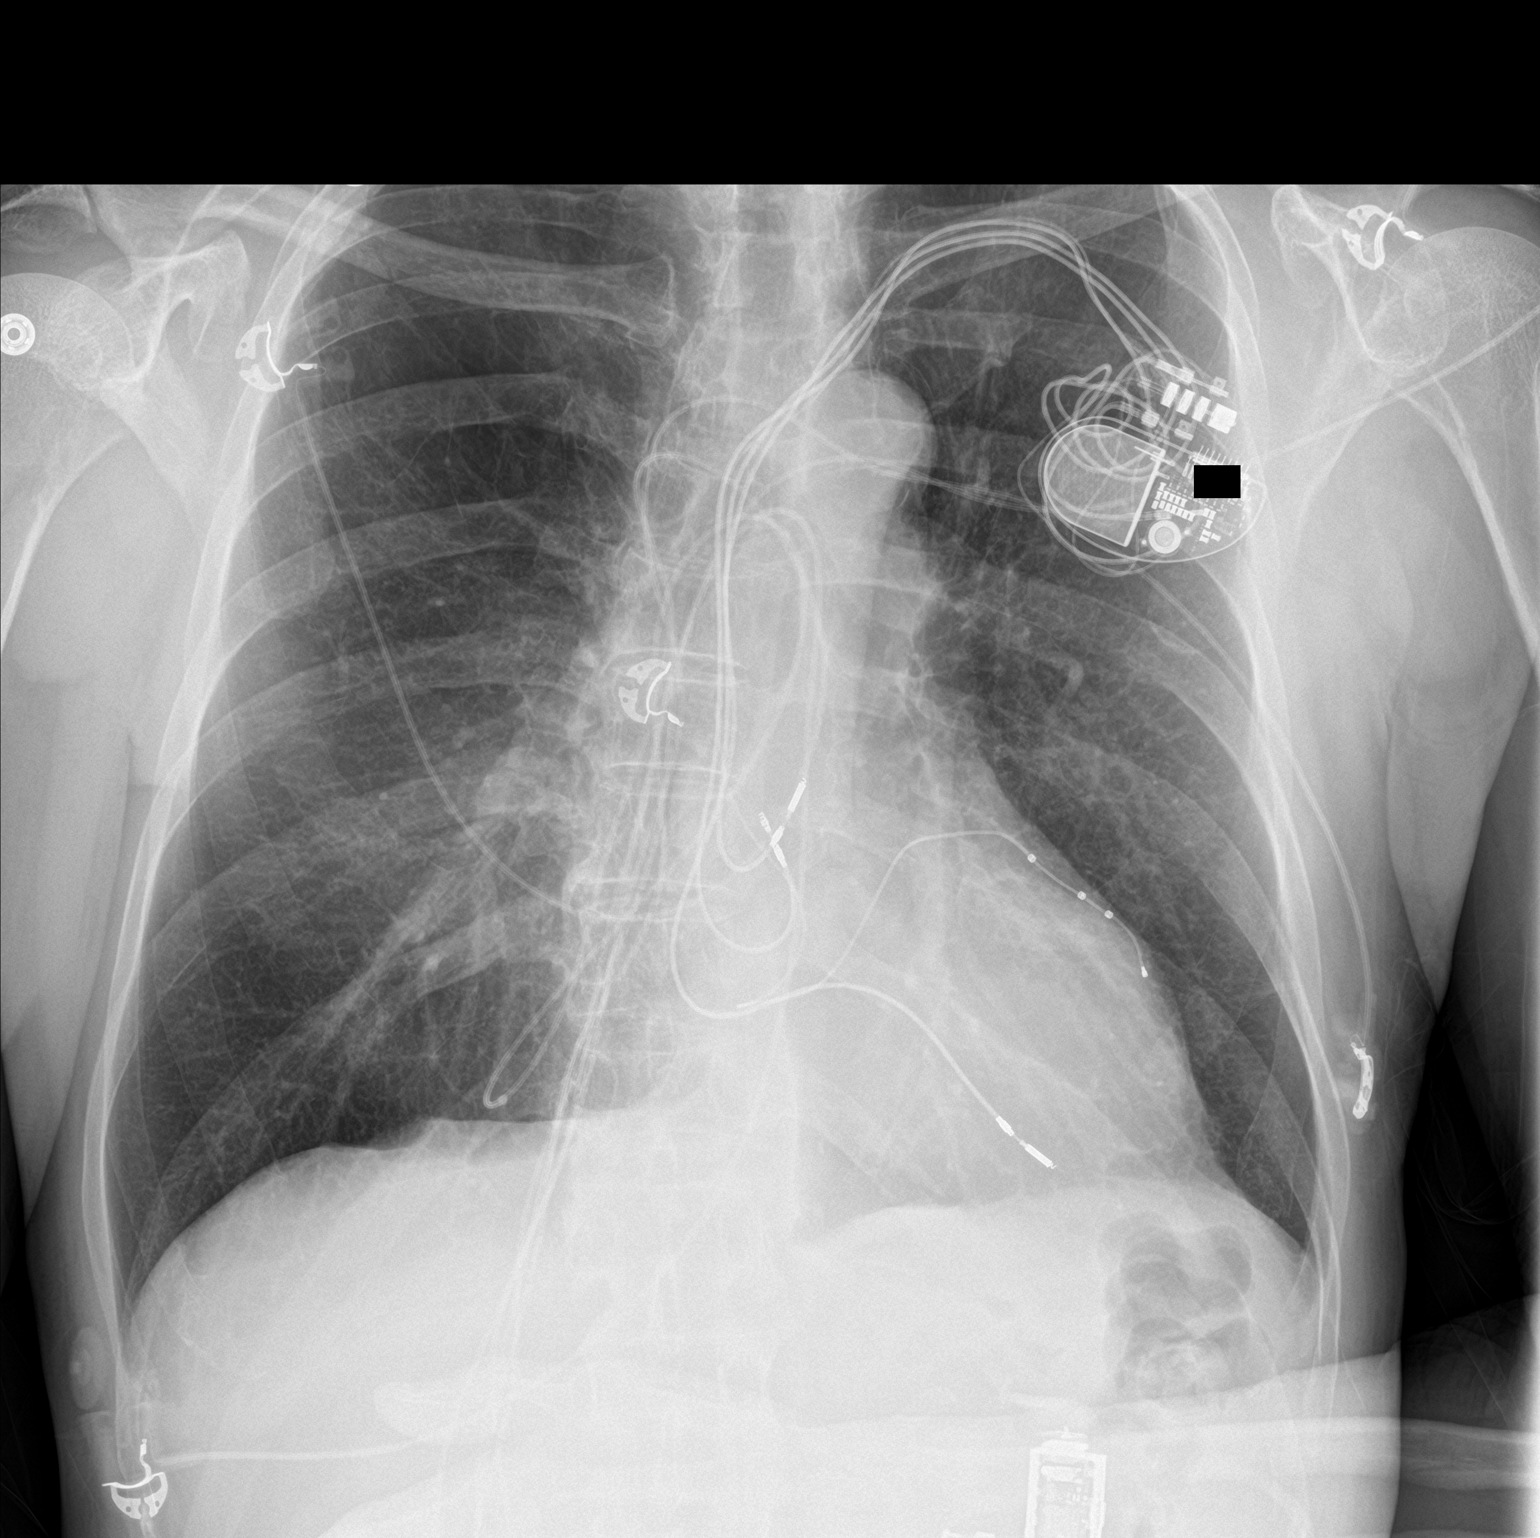

[chest lat]
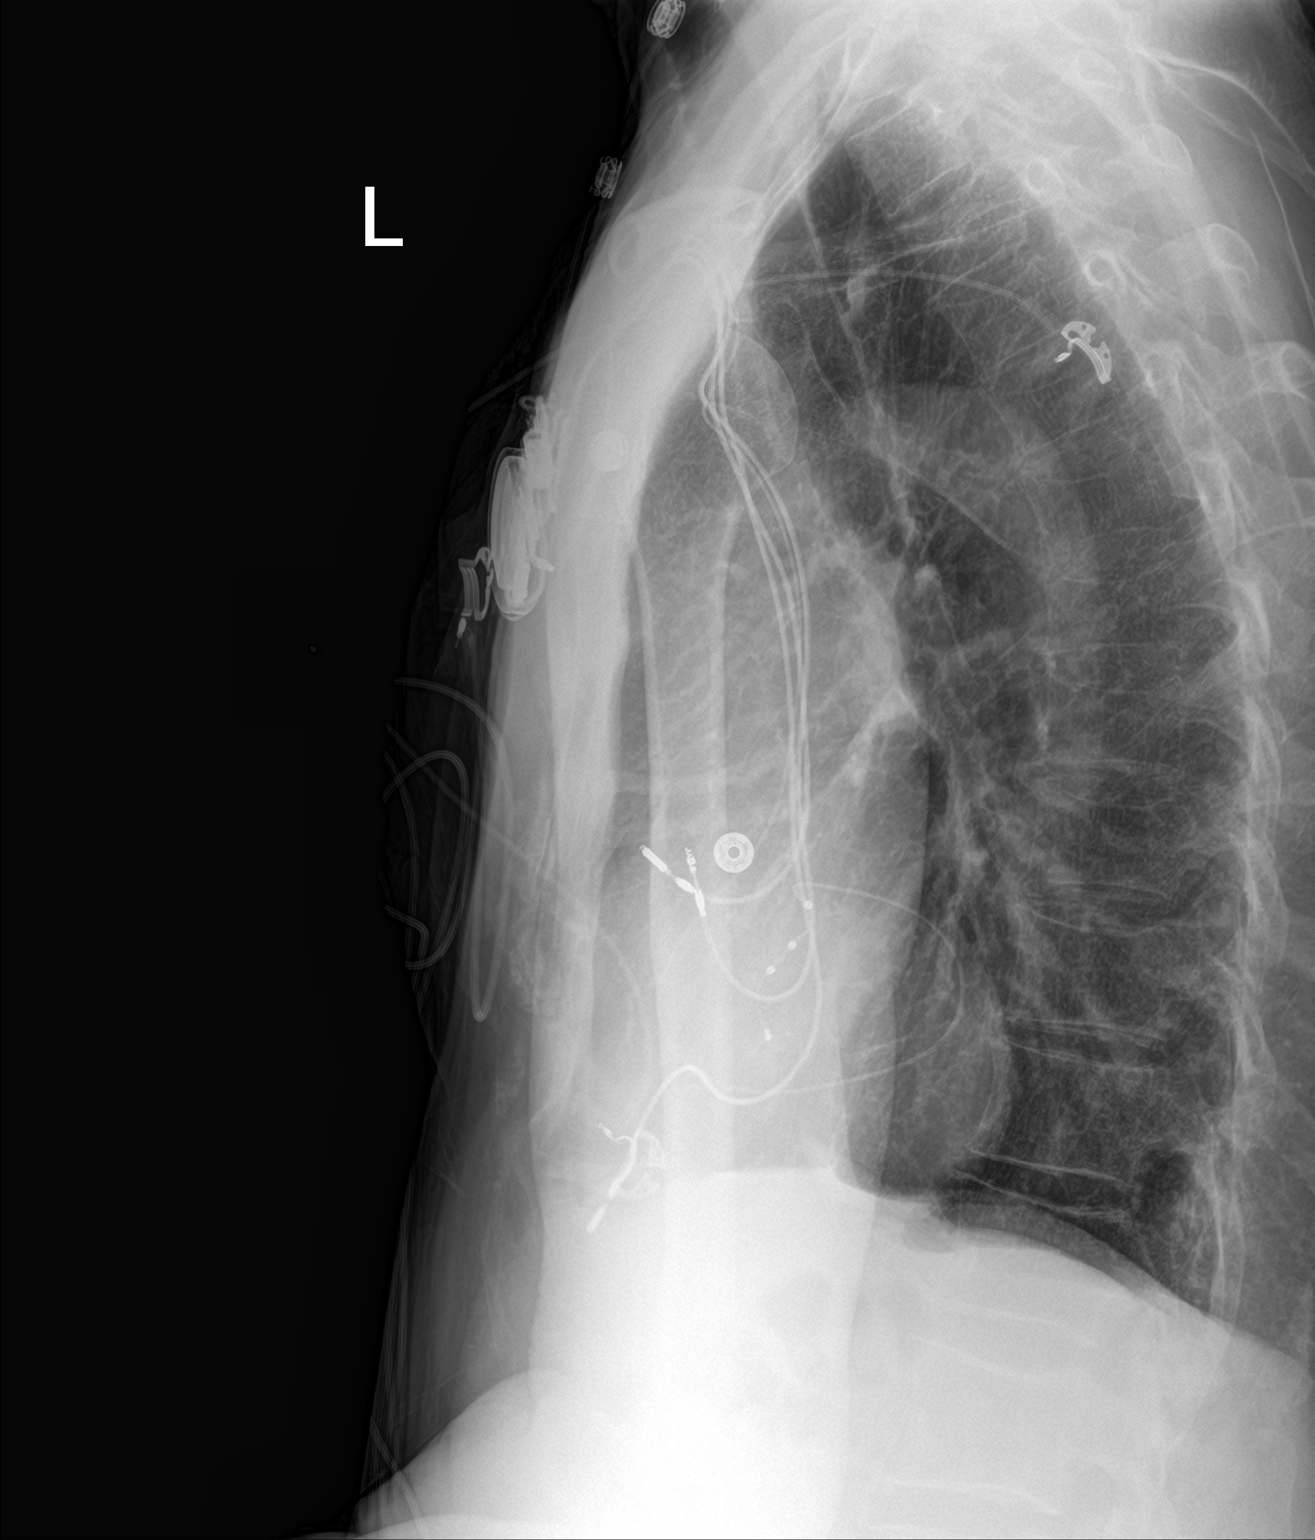

[2 of 2 positions shown; findings below may reference images not displayed]

FINDINGS: PA and lateral views of the chest. New left chest generator device
and transvenous leads compared [REDACTED]. There are new right atrium
region and coronary sinus region leads. The previous right atrium
and RV lead remain in place. Overlying EKG leads. No adverse
features identified. Stable cardiac size and mediastinal contours,
evidence of left atrial enlargement. Stable large lung volumes. No
pneumothorax, pulmonary edema, pleural effusion or acute pulmonary
opacity. Visualized tracheal air column is within normal limits. No
acute osseous abnormality identified. Negative visible bowel gas
pattern.
IMPRESSION: 1. Left chest pacemaker revision with no adverse features.
2.  No acute cardiopulmonary abnormality.

## 2018-10-23 ENCOUNTER — Other Ambulatory Visit: Payer: Self-pay | Admitting: Cardiology

## 2018-10-24 DIAGNOSIS — R2689 Other abnormalities of gait and mobility: Secondary | ICD-10-CM | POA: Diagnosis not present

## 2018-10-24 DIAGNOSIS — S76311A Strain of muscle, fascia and tendon of the posterior muscle group at thigh level, right thigh, initial encounter: Secondary | ICD-10-CM | POA: Diagnosis not present

## 2018-10-24 DIAGNOSIS — M25551 Pain in right hip: Secondary | ICD-10-CM | POA: Diagnosis not present

## 2018-11-04 DIAGNOSIS — M25551 Pain in right hip: Secondary | ICD-10-CM | POA: Diagnosis not present

## 2018-11-04 DIAGNOSIS — S76311A Strain of muscle, fascia and tendon of the posterior muscle group at thigh level, right thigh, initial encounter: Secondary | ICD-10-CM | POA: Diagnosis not present

## 2018-11-04 DIAGNOSIS — R2689 Other abnormalities of gait and mobility: Secondary | ICD-10-CM | POA: Diagnosis not present

## 2018-11-09 DIAGNOSIS — Z23 Encounter for immunization: Secondary | ICD-10-CM | POA: Diagnosis not present

## 2018-11-11 DIAGNOSIS — S76311A Strain of muscle, fascia and tendon of the posterior muscle group at thigh level, right thigh, initial encounter: Secondary | ICD-10-CM | POA: Diagnosis not present

## 2018-11-11 DIAGNOSIS — R2689 Other abnormalities of gait and mobility: Secondary | ICD-10-CM | POA: Diagnosis not present

## 2018-11-11 DIAGNOSIS — M25551 Pain in right hip: Secondary | ICD-10-CM | POA: Diagnosis not present

## 2018-11-25 DIAGNOSIS — M25551 Pain in right hip: Secondary | ICD-10-CM | POA: Diagnosis not present

## 2018-11-25 DIAGNOSIS — R2689 Other abnormalities of gait and mobility: Secondary | ICD-10-CM | POA: Diagnosis not present

## 2018-11-25 DIAGNOSIS — S76311A Strain of muscle, fascia and tendon of the posterior muscle group at thigh level, right thigh, initial encounter: Secondary | ICD-10-CM | POA: Diagnosis not present

## 2018-12-16 ENCOUNTER — Other Ambulatory Visit: Payer: Self-pay | Admitting: Cardiology

## 2018-12-16 ENCOUNTER — Other Ambulatory Visit: Payer: Self-pay

## 2018-12-19 DIAGNOSIS — Z0000 Encounter for general adult medical examination without abnormal findings: Secondary | ICD-10-CM | POA: Diagnosis not present

## 2018-12-19 DIAGNOSIS — Z125 Encounter for screening for malignant neoplasm of prostate: Secondary | ICD-10-CM | POA: Diagnosis not present

## 2018-12-19 DIAGNOSIS — N400 Enlarged prostate without lower urinary tract symptoms: Secondary | ICD-10-CM | POA: Diagnosis not present

## 2018-12-19 DIAGNOSIS — Z79899 Other long term (current) drug therapy: Secondary | ICD-10-CM | POA: Diagnosis not present

## 2018-12-19 DIAGNOSIS — Z950 Presence of cardiac pacemaker: Secondary | ICD-10-CM | POA: Diagnosis not present

## 2018-12-19 DIAGNOSIS — I420 Dilated cardiomyopathy: Secondary | ICD-10-CM | POA: Diagnosis not present

## 2018-12-19 DIAGNOSIS — E785 Hyperlipidemia, unspecified: Secondary | ICD-10-CM | POA: Diagnosis not present

## 2018-12-23 ENCOUNTER — Ambulatory Visit (INDEPENDENT_AMBULATORY_CARE_PROVIDER_SITE_OTHER): Payer: Medicare Other | Admitting: *Deleted

## 2018-12-23 DIAGNOSIS — I428 Other cardiomyopathies: Secondary | ICD-10-CM

## 2018-12-23 DIAGNOSIS — I442 Atrioventricular block, complete: Secondary | ICD-10-CM

## 2018-12-27 LAB — CUP PACEART REMOTE DEVICE CHECK
Battery Remaining Longevity: 106 mo
Battery Remaining Percentage: 95.5 %
Battery Voltage: 2.99 V
Brady Statistic AP VP Percent: 1 %
Brady Statistic AP VS Percent: 1 %
Brady Statistic AS VP Percent: 99 %
Brady Statistic AS VS Percent: 1 %
Brady Statistic RA Percent Paced: 1 %
Date Time Interrogation Session: 20201113012208
Implantable Lead Implant Date: 20130812
Implantable Lead Implant Date: 20190513
Implantable Lead Implant Date: 20190513
Implantable Lead Location: 753858
Implantable Lead Location: 753859
Implantable Lead Location: 753860
Implantable Pulse Generator Implant Date: 20190513
Lead Channel Impedance Value: 310 Ohm
Lead Channel Impedance Value: 330 Ohm
Lead Channel Impedance Value: 980 Ohm
Lead Channel Pacing Threshold Amplitude: 0.875 V
Lead Channel Pacing Threshold Amplitude: 1 V
Lead Channel Pacing Threshold Amplitude: 1 V
Lead Channel Pacing Threshold Pulse Width: 0.2 ms
Lead Channel Pacing Threshold Pulse Width: 0.4 ms
Lead Channel Pacing Threshold Pulse Width: 0.6 ms
Lead Channel Sensing Intrinsic Amplitude: 10.8 mV
Lead Channel Sensing Intrinsic Amplitude: 5 mV
Lead Channel Setting Pacing Amplitude: 1.875
Lead Channel Setting Pacing Amplitude: 2 V
Lead Channel Setting Pacing Amplitude: 2 V
Lead Channel Setting Pacing Pulse Width: 0.4 ms
Lead Channel Setting Pacing Pulse Width: 0.6 ms
Lead Channel Setting Sensing Sensitivity: 4 mV
Pulse Gen Serial Number: 9431568

## 2019-01-07 DIAGNOSIS — N3289 Other specified disorders of bladder: Secondary | ICD-10-CM | POA: Diagnosis not present

## 2019-01-07 DIAGNOSIS — N401 Enlarged prostate with lower urinary tract symptoms: Secondary | ICD-10-CM | POA: Diagnosis not present

## 2019-01-07 DIAGNOSIS — R351 Nocturia: Secondary | ICD-10-CM | POA: Diagnosis not present

## 2019-01-21 ENCOUNTER — Encounter: Payer: Self-pay | Admitting: Cardiology

## 2019-01-21 ENCOUNTER — Other Ambulatory Visit: Payer: Self-pay

## 2019-01-21 ENCOUNTER — Ambulatory Visit (INDEPENDENT_AMBULATORY_CARE_PROVIDER_SITE_OTHER): Payer: Medicare Other | Admitting: Cardiology

## 2019-01-21 VITALS — BP 108/60 | HR 87 | Ht 71.0 in | Wt 159.4 lb

## 2019-01-21 DIAGNOSIS — E785 Hyperlipidemia, unspecified: Secondary | ICD-10-CM

## 2019-01-21 DIAGNOSIS — I2510 Atherosclerotic heart disease of native coronary artery without angina pectoris: Secondary | ICD-10-CM

## 2019-01-21 DIAGNOSIS — Z950 Presence of cardiac pacemaker: Secondary | ICD-10-CM | POA: Diagnosis not present

## 2019-01-21 DIAGNOSIS — I428 Other cardiomyopathies: Secondary | ICD-10-CM

## 2019-01-21 DIAGNOSIS — I442 Atrioventricular block, complete: Secondary | ICD-10-CM | POA: Diagnosis not present

## 2019-01-21 MED ORDER — EZETIMIBE 10 MG PO TABS: 10.0000 mg | ORAL_TABLET | Freq: Every day | ORAL | 1 refills | Status: DC

## 2019-01-21 NOTE — Addendum Note (Signed)
Addended by: Ashok Norris on: 01/21/2019 09:46 AM   Modules accepted: Orders

## 2019-01-21 NOTE — Progress Notes (Signed)
Cardiology Office Note:    Date:  01/21/2019   ID:  Ian Mcgee, DOB 11-Oct-1945, MRN [id number]  PCP:  Myer Peer, MD  Cardiologist:  Jenne Campus, MD    Referring MD: Myer Peer, MD   Chief Complaint  Patient presents with  . Follow-up  Doing well  History of Present Illness:    Ian Mcgee is a 73 y.o. male with past medical history significant for coronary artery disease 40% left main 50% LAD 50% RCA in cardiac catheterization March 2019, cardiomyopathy with diminished ejection fraction however after upgrading his device to BiV pacing improvement to normalization, also peripheral vascular disease with carotic arterial disease, intolerance to statin.  He comes today to my office for follow-up overall doing well still very active feels dramatically better after upgrading his device to BiV pacing.  Still play tennis like to hike and does a lot of activity with no difficulties.  Past Medical History:  Diagnosis Date  . Cardiac pacemaker 05/10/2017  . Cardiomyopathy ([place]) 05/08/2017  . Complete heart block ([place]) 06/10/2015  . Coronary artery disease 05/15/2017   40% left main, 50% LAD, 50% RCA recent cardiac catheterization from March 2019  . Dyslipidemia 05/08/2017  . Hyperlipidemia   . Nonischemic cardiomyopathy ([place]) 06/25/2017  . Pacemaker reprogramming/check 06/10/2015  . Peripheral vascular disease ([place]) 05/08/2017   Noncritical bilateral carotid arterial disease  . Status post biventricular pacemaker 07/16/2017  . Syncope and collapse     Past Surgical History:  Procedure Laterality Date  . Basel Cell Cancer    . BIV UPGRADE N/A 06/25/2017   Procedure: BIV UPGRADE;  Surgeon: Constance Haw, MD;  Location: [facility];  Service: Cardiovascular;  Laterality: N/A;  . CATARACT EXTRACTION    . INSERT / REPLACE / REMOVE PACEMAKER    . KNEE SURGERY Right    ACL Reconstruction  . LEFT HEART CATH AND CORONARY ANGIOGRAPHY N/A 05/10/2017   Procedure:  LEFT HEART CATH AND CORONARY ANGIOGRAPHY;  Surgeon: [name], MD;  Location: [facility];  Service: Cardiovascular;  Laterality: N/A;  . VASECTOMY      Current Medications: Current Meds  Medication Sig  . buPROPion (WELLBUTRIN SR) 150 MG 12 hr tablet Take 150 mg by mouth daily.   . finasteride (PROSCAR) 5 MG tablet Take 5 mg by mouth daily.  . ramipril (ALTACE) 5 MG capsule TAKE 1 CAPSULE BY MOUTH TWICE DAILY EVERY DAY  . tadalafil (CIALIS) 5 MG tablet Take 5 mg by mouth daily. For enlarged prostate     Allergies:   Ciprofloxacin and Crestor [rosuvastatin calcium]   Social History   Socioeconomic History  . Marital status: Married    Spouse name: Not on file  . Number of children: Not on file  . Years of education: Not on file  . Highest education level: Not on file  Occupational History  . Not on file  Social Needs  . Financial resource strain: Not on file  . Food insecurity    Worry: Not on file    Inability: Not on file  . Transportation needs    Medical: Not on file    Non-medical: Not on file  Tobacco Use  . Smoking status: Former Smoker    Types: Cigarettes  . Smokeless tobacco: Never Used  Substance and Sexual Activity  . Alcohol use: Yes    Comment: Wine with dinner  . Drug use: Never  . Sexual activity: Not on file  Lifestyle  . Physical activity    Days per week: Not on file    Minutes per session: Not on file  . Stress: Not on file  Relationships  . Social Herbalist on phone: Not on file    Gets together: Not on file    Attends religious service: Not on file    Active member of club or organization: Not on file    Attends meetings of clubs or organizations: Not on file    Relationship status: Not on file  Other Topics Concern  . Not on file  Social History Narrative  . Not on file     Family History: The patient's family history includes CAD in his brother, father, and mother; Healthy in his brother and sister;  Hypertension in his mother; Leukemia in his father; Osteoporosis in his mother; Rheumatic fever in his brother. ROS:   Please see the history of present illness.    All 14 point review of systems negative except as described per history of present illness  EKGs/Labs/Other Studies Reviewed:      Recent Labs: No results found for requested labs within last 8760 hours.  Recent Lipid Panel No results found for: CHOL, TRIG, HDL, CHOLHDL, VLDL, LDLCALC, LDLDIRECT  Physical Exam:    VS:  BP 108/60   Pulse 87   Ht 5\' 11"  (1.803 m)   Wt 159 lb 6.4 oz (72.3 kg)   SpO2 98%   BMI 22.23 kg/m     Wt Readings from Last 3 Encounters:  01/21/19 159 lb 6.4 oz (72.3 kg)  10/10/17 164 lb (74.4 kg)  08/15/17 163 lb (73.9 kg)     GEN:  Well nourished, well developed in no acute distress HEENT: Normal NECK: No JVD; No carotid bruits LYMPHATICS: No lymphadenopathy CARDIAC: RRR, no murmurs, no rubs, no gallops RESPIRATORY:  Clear to auscultation without rales, wheezing or rhonchi  ABDOMEN: Soft, non-tender, non-distended MUSCULOSKELETAL:  No edema; No deformity  SKIN: Warm and dry LOWER EXTREMITIES: no swelling NEUROLOGIC:  Alert and oriented x 3 PSYCHIATRIC:  Normal affect   ASSESSMENT:    1. Nonischemic cardiomyopathy ([place])   2. Coronary artery disease involving native coronary artery of native heart without angina pectoris   3. Complete heart block ([facility])   4. Status post biventricular pacemaker   5. Dyslipidemia    PLAN:    In order of problems listed above:  1. Nonischemic cardiomyopathy time to repeat echocardiogram which we will do. 2. Coronary disease asymptomatic decrease risk factors modifications.  He is unable to tolerate statin.  I was able to convince him to go on Zetia. 3. Complete heart block.  That being addressed with a pacemaker.  Pacemaker being followed by our EP team doing well from that point review 4. Dyslipidemia we will try Zetia 10 then we will check fasting  lipid profile 5. Peripheral vascular disease we will repeat carotid ultrasound.   Medication Adjustments/Labs and Tests Ordered: Current medicines are reviewed at length with the patient today.  Concerns regarding medicines are outlined above.  No orders of the defined types were placed in this encounter.  Medication changes: No orders of the defined types were placed in this encounter.   Signed, Park Liter, MD, [facility] 01/21/2019 9:40 AM    [place]

## 2019-01-21 NOTE — Progress Notes (Signed)
Remote pacemaker transmission.   

## 2019-01-21 NOTE — Patient Instructions (Signed)
Medication Instructions:  Your physician has recommended you make the following change in your medication:   START: Zetia 10 mg daily  *If you need a refill on your cardiac medications before your next appointment, please call your pharmacy*  Lab Work: None.  If you have labs (blood work) drawn today and your tests are completely normal, you will receive your results only by: Marland Kitchen MyChart Message (if you have MyChart) OR . A paper copy in the mail If you have any lab test that is abnormal or we need to change your treatment, we will call you to review the results.  Testing/Procedures: Your physician has requested that you have an echocardiogram. Echocardiography is a painless test that uses sound waves to create images of your heart. It provides your doctor with information about the size and shape of your heart and how well your heart's chambers and valves are working. This procedure takes approximately one hour. There are no restrictions for this procedure.  Your physician has requested that you have a carotid duplex. This test is an ultrasound of the carotid arteries in your neck. It looks at blood flow through these arteries that supply the brain with blood. Allow one hour for this exam. There are no restrictions or special instructions.    Follow-Up: At [facility], you and your health needs are our priority.  As part of our continuing mission to provide you with exceptional heart care, we have created designated Provider Care Teams.  These Care Teams include your primary Cardiologist (physician) and Advanced Practice Providers (APPs -  Physician Assistants and Nurse Practitioners) who all work together to provide you with the care you need, when you need it.  Your next appointment:   6 month(s)  The format for your next appointment:   In Person  Provider:   Jenne Campus, MD  Other Instructions   Echocardiogram An echocardiogram is a procedure that uses painless sound  waves (ultrasound) to produce an image of the heart. Images from an echocardiogram can provide important information about:  Signs of coronary artery disease (CAD).  Aneurysm detection. An aneurysm is a weak or damaged part of an artery wall that bulges out from the normal force of blood pumping through the body.  Heart size and shape. Changes in the size or shape of the heart can be associated with certain conditions, including heart failure, aneurysm, and CAD.  Heart muscle function.  Heart valve function.  Signs of a past heart attack.  Fluid buildup around the heart.  Thickening of the heart muscle.  A tumor or infectious growth around the heart valves. Tell a health care provider about:  Any allergies you have.  All medicines you are taking, including vitamins, herbs, eye drops, creams, and over-the-counter medicines.  Any blood disorders you have.  Any surgeries you have had.  Any medical conditions you have.  Whether you are pregnant or may be pregnant. What are the risks? Generally, this is a safe procedure. However, problems may occur, including:  Allergic reaction to dye (contrast) that may be used during the procedure. What happens before the procedure? No specific preparation is needed. You may eat and drink normally. What happens during the procedure?   An IV tube may be inserted into one of your veins.  You may receive contrast through this tube. A contrast is an injection that improves the quality of the pictures from your heart.  A gel will be applied to your chest.  A wand-like tool (  transducer) will be moved over your chest. The gel will help to transmit the sound waves from the transducer.  The sound waves will harmlessly bounce off of your heart to allow the heart images to be captured in real-time motion. The images will be recorded on a computer. The procedure may vary among health care providers and hospitals. What happens after the procedure?   You may return to your normal, everyday life, including diet, activities, and medicines, unless your health care provider tells you not to do that. Summary  An echocardiogram is a procedure that uses painless sound waves (ultrasound) to produce an image of the heart.  Images from an echocardiogram can provide important information about the size and shape of your heart, heart muscle function, heart valve function, and fluid buildup around your heart.  You do not need to do anything to prepare before this procedure. You may eat and drink normally.  After the echocardiogram is completed, you may return to your normal, everyday life, unless your health care provider tells you not to do that. This information is not intended to replace advice given to you by your health care provider. Make sure you discuss any questions you have with your health care provider. Document Released: 01/28/2000 Document Revised: 05/23/2018 Document Reviewed: 03/04/2016 Elsevier Patient Education  [place].   Ezetimibe Tablets What is this medicine? EZETIMIBE (ez ET i mibe) blocks the absorption of cholesterol from the stomach. It can help lower blood cholesterol for patients who are at risk of getting heart disease or a stroke. It is only for patients whose cholesterol level is not controlled by diet. This medicine may be used for other purposes; ask your health care provider or pharmacist if you have questions. COMMON BRAND NAME(S): Zetia What should I tell my health care provider before I take this medicine? They need to know if you have any of these conditions:  liver disease  an unusual or allergic reaction to ezetimibe, medicines, foods, dyes, or preservatives  pregnant or trying to get pregnant  breast-feeding How should I use this medicine? Take this medicine by mouth with a glass of water. Follow the directions on the prescription label. This medicine can be taken with or without food. Take  your doses at regular intervals. Do not take your medicine more often than directed. Talk to your pediatrician regarding the use of this medicine in children. Special care may be needed. Overdosage: If you think you have taken too much of this medicine contact a poison control center or emergency room at once. NOTE: This medicine is only for you. Do not share this medicine with others. What if I miss a dose? If you miss a dose, take it as soon as you can. If it is almost time for your next dose, take only that dose. Do not take double or extra doses. What may interact with this medicine? Do not take this medicine with any of the following medications:  fenofibrate  gemfibrozil This medicine may also interact with the following medications:  antacids  cyclosporine  herbal medicines like red yeast rice  other medicines to lower cholesterol or triglycerides This list may not describe all possible interactions. Give your health care provider a list of all the medicines, herbs, non-prescription drugs, or dietary supplements you use. Also tell them if you smoke, drink alcohol, or use illegal drugs. Some items may interact with your medicine. What should I watch for while using this medicine? Visit your doctor or  health care professional for regular checks on your progress. You will need to have your cholesterol levels checked. If you are also taking some other cholesterol medicines, you will also need to have tests to make sure your liver is working properly. Tell your doctor or health care professional if you get any unexplained muscle pain, tenderness, or weakness, especially if you also have a fever and tiredness. You need to follow a low-cholesterol, low-fat diet while you are taking this medicine. This will decrease your risk of getting heart and blood vessel disease. Exercising and avoiding alcohol and smoking can also help. Ask your doctor or dietician for advice. What side effects may I  notice from receiving this medicine? Side effects that you should report to your doctor or health care professional as soon as possible:  allergic reactions like skin rash, itching or hives, swelling of the face, lips, or tongue  dark yellow or brown urine  unusually weak or tired  yellowing of the skin or eyes Side effects that usually do not require medical attention (report to your doctor or health care professional if they continue or are bothersome):  diarrhea  dizziness  headache  stomach upset or pain This list may not describe all possible side effects. Call your doctor for medical advice about side effects. You may report side effects to FDA at 1-800-FDA-1088. Where should I keep my medicine? Keep out of the reach of children. Store at room temperature between 15 and 30 degrees C (59 and 86 degrees F). Protect from moisture. Keep container tightly closed. Throw away any unused medicine after the expiration date. NOTE: This sheet is a summary. It may not cover all possible information. If you have questions about this medicine, talk to your doctor, pharmacist, or health care provider.  2020 Elsevier/Gold Standard (2011-08-07 15:39:09)

## 2019-01-27 DIAGNOSIS — Z87891 Personal history of nicotine dependence: Secondary | ICD-10-CM | POA: Diagnosis not present

## 2019-01-27 DIAGNOSIS — R490 Dysphonia: Secondary | ICD-10-CM | POA: Diagnosis not present

## 2019-01-27 DIAGNOSIS — R1310 Dysphagia, unspecified: Secondary | ICD-10-CM | POA: Diagnosis not present

## 2019-01-27 DIAGNOSIS — G522 Disorders of vagus nerve: Secondary | ICD-10-CM | POA: Diagnosis not present

## 2019-03-16 ENCOUNTER — Other Ambulatory Visit: Payer: Self-pay | Admitting: Cardiology

## 2019-03-19 ENCOUNTER — Ambulatory Visit (INDEPENDENT_AMBULATORY_CARE_PROVIDER_SITE_OTHER): Payer: Medicare Other

## 2019-03-19 ENCOUNTER — Other Ambulatory Visit: Payer: Self-pay

## 2019-03-19 DIAGNOSIS — I428 Other cardiomyopathies: Secondary | ICD-10-CM | POA: Diagnosis not present

## 2019-03-19 DIAGNOSIS — I2510 Atherosclerotic heart disease of native coronary artery without angina pectoris: Secondary | ICD-10-CM

## 2019-03-19 NOTE — Progress Notes (Signed)
Complete echocardiogram has been performed.  Jimmy Johney Perotti RDCS, RVT 

## 2019-03-19 NOTE — Progress Notes (Signed)
Carotid duplex exam has been performed.  Jimmy Kaleyah Labreck RDCS, RVT 

## 2019-03-19 NOTE — Progress Notes (Deleted)
Complete echocardiogram has been performed.  Jimmy Roseanne Juenger RDCS, RVT 

## 2019-03-24 ENCOUNTER — Ambulatory Visit (INDEPENDENT_AMBULATORY_CARE_PROVIDER_SITE_OTHER): Payer: Medicare Other | Admitting: *Deleted

## 2019-03-24 ENCOUNTER — Ambulatory Visit: Payer: Medicare Other

## 2019-03-24 DIAGNOSIS — I442 Atrioventricular block, complete: Secondary | ICD-10-CM | POA: Diagnosis not present

## 2019-03-25 LAB — CUP PACEART REMOTE DEVICE CHECK
Battery Remaining Longevity: 106 mo
Battery Remaining Percentage: 95.5 %
Battery Voltage: 2.99 V
Brady Statistic AP VP Percent: 1 %
Brady Statistic AP VS Percent: 1 %
Brady Statistic AS VP Percent: 99 %
Brady Statistic AS VS Percent: 1 %
Brady Statistic RA Percent Paced: 1 %
Date Time Interrogation Session: 20210208233037
Implantable Lead Implant Date: 20130812
Implantable Lead Implant Date: 20190513
Implantable Lead Implant Date: 20190513
Implantable Lead Location: 753858
Implantable Lead Location: 753859
Implantable Lead Location: 753860
Implantable Pulse Generator Implant Date: 20190513
Lead Channel Impedance Value: 330 Ohm
Lead Channel Impedance Value: 350 Ohm
Lead Channel Impedance Value: 940 Ohm
Lead Channel Pacing Threshold Amplitude: 0.875 V
Lead Channel Pacing Threshold Amplitude: 1 V
Lead Channel Pacing Threshold Amplitude: 1.25 V
Lead Channel Pacing Threshold Pulse Width: 0.2 ms
Lead Channel Pacing Threshold Pulse Width: 0.4 ms
Lead Channel Pacing Threshold Pulse Width: 0.6 ms
Lead Channel Sensing Intrinsic Amplitude: 5 mV
Lead Channel Sensing Intrinsic Amplitude: 9.6 mV
Lead Channel Setting Pacing Amplitude: 1.875
Lead Channel Setting Pacing Amplitude: 2 V
Lead Channel Setting Pacing Amplitude: 2.25 V
Lead Channel Setting Pacing Pulse Width: 0.4 ms
Lead Channel Setting Pacing Pulse Width: 0.6 ms
Lead Channel Setting Sensing Sensitivity: 4 mV
Pulse Gen Model: 3562
Pulse Gen Serial Number: 9431568

## 2019-03-25 NOTE — Progress Notes (Signed)
PPM Remote  

## 2019-03-27 ENCOUNTER — Other Ambulatory Visit: Payer: Self-pay

## 2019-03-27 ENCOUNTER — Encounter: Payer: Self-pay | Admitting: Cardiology

## 2019-03-27 ENCOUNTER — Ambulatory Visit (INDEPENDENT_AMBULATORY_CARE_PROVIDER_SITE_OTHER): Payer: Medicare Other | Admitting: Cardiology

## 2019-03-27 VITALS — BP 110/70 | HR 89 | Ht 71.0 in | Wt 157.0 lb

## 2019-03-27 DIAGNOSIS — I2510 Atherosclerotic heart disease of native coronary artery without angina pectoris: Secondary | ICD-10-CM | POA: Diagnosis not present

## 2019-03-27 DIAGNOSIS — I420 Dilated cardiomyopathy: Secondary | ICD-10-CM | POA: Diagnosis not present

## 2019-03-27 DIAGNOSIS — I442 Atrioventricular block, complete: Secondary | ICD-10-CM | POA: Diagnosis not present

## 2019-03-27 DIAGNOSIS — E785 Hyperlipidemia, unspecified: Secondary | ICD-10-CM

## 2019-03-27 DIAGNOSIS — I739 Peripheral vascular disease, unspecified: Secondary | ICD-10-CM

## 2019-03-27 MED ORDER — ASPIRIN EC 81 MG PO TBEC: 81.0000 mg | DELAYED_RELEASE_TABLET | Freq: Every day | ORAL | 3 refills | Status: DC

## 2019-03-27 NOTE — Progress Notes (Signed)
Cardiology Office Note:    Date:  03/27/2019   ID:  Ian Mcgee, DOB 02/17/1945, MRN [id number]  PCP:  Mateo Flow, MD  Cardiologist:  Jenne Campus, MD    Referring MD: Mateo Flow, MD   Chief Complaint  Patient presents with  . Follow-up    Carotid    History of Present Illness:    Ian Mcgee is a 74 y.o. male with past medical history significant for complete heart block, he required dual-chamber pacemaker that was implanted years ago.  However later he developed cardiomyopathy which we felt was related to frequent ventricular pacing and his device has been upgraded to BiV pacing with excellent results.  His ejection fraction lately is normal.  He does have coronary artery disease with last cardiac catheterization done in 2019 showing 50% LAD and 50% right coronary artery.  Does have also diagnosis of peripheral vascular disease with narrowing up to 60% in both carotid arteries.  We have been following narrowing of his carotid arteries for years and there is gradual progression of the problem.  He comes today to my office to discuss this issue.  Overall he is doing well he feels great after upgrading his device to BiV pacing he is able to play tennis he is very energetic he can do whatever he wants to do overall doing very well.  And the purpose of this visit was mostly to discuss his risk factors modifications.  Past Medical History:  Diagnosis Date  . Cardiac pacemaker 05/10/2017  . Cardiomyopathy ([place]) 05/08/2017  . Complete heart block ([place]) 06/10/2015  . Coronary artery disease 05/15/2017   40% left main, 50% LAD, 50% RCA recent cardiac catheterization from March 2019  . Dyslipidemia 05/08/2017  . Hyperlipidemia   . Nonischemic cardiomyopathy ([place]) 06/25/2017  . Pacemaker reprogramming/check 06/10/2015  . Peripheral vascular disease ([place]) 05/08/2017   Noncritical bilateral carotid arterial disease  . Status post biventricular pacemaker 07/16/2017  . Syncope and  collapse     Past Surgical History:  Procedure Laterality Date  . Basel Cell Cancer    . BIV UPGRADE N/A 06/25/2017   Procedure: BIV UPGRADE;  Surgeon: Constance Haw, MD;  Location: [place] [facility];  Service: Cardiovascular;  Laterality: N/A;  . CATARACT EXTRACTION    . INSERT / REPLACE / REMOVE PACEMAKER    . KNEE SURGERY Right    ACL Reconstruction  . LEFT HEART CATH AND CORONARY ANGIOGRAPHY N/A 05/10/2017   Procedure: LEFT HEART CATH AND CORONARY ANGIOGRAPHY;  Surgeon: [name], MD;  Location: [place] [facility];  Service: Cardiovascular;  Laterality: N/A;  . VASECTOMY      Current Medications: Current Meds  Medication Sig  . amphetamine-dextroamphetamine (ADDERALL XR) 20 MG 24 hr capsule Take 20 mg by mouth every morning.  . ezetimibe (ZETIA) 10 MG tablet Take 1 tablet (10 mg total) by mouth daily.  . finasteride (PROSCAR) 5 MG tablet Take 5 mg by mouth daily.  . psyllium (METAMUCIL) 58.6 % packet Take by mouth.  . ramipril (ALTACE) 5 MG capsule TAKE 1 CAPSULE BY MOUTH TWICE DAILY EVERY DAY  . tadalafil (CIALIS) 5 MG tablet Take 5 mg by mouth daily. For enlarged prostate  . tamsulosin (FLOMAX) 0.4 MG CAPS capsule Take 0.4 mg by mouth at bedtime.     Allergies:   Ciprofloxacin and Crestor [rosuvastatin calcium]   Social History   Socioeconomic History  . Marital status: Married    Spouse name:  Not on file  . Number of children: Not on file  . Years of education: Not on file  . Highest education level: Not on file  Occupational History  . Not on file  Tobacco Use  . Smoking status: Former Smoker    Types: Cigarettes  . Smokeless tobacco: Never Used  Substance and Sexual Activity  . Alcohol use: Yes    Comment: Wine with dinner  . Drug use: Never  . Sexual activity: Not on file  Other Topics Concern  . Not on file  Social History Narrative  . Not on file   Social Determinants of Health   Financial Resource Strain:   . Difficulty of Paying  Living Expenses: Not on file  Food Insecurity:   . Worried About Charity fundraiser in the Last Year: Not on file  . Ran Out of Food in the Last Year: Not on file  Transportation Needs:   . Lack of Transportation (Medical): Not on file  . Lack of Transportation (Non-Medical): Not on file  Physical Activity:   . Days of Exercise per Week: Not on file  . Minutes of Exercise per Session: Not on file  Stress:   . Feeling of Stress : Not on file  Social Connections:   . Frequency of Communication with Friends and Family: Not on file  . Frequency of Social Gatherings with Friends and Family: Not on file  . Attends Religious Services: Not on file  . Active Member of Clubs or Organizations: Not on file  . Attends Archivist Meetings: Not on file  . Marital Status: Not on file     Family History: The patient's family history includes CAD in his brother, father, and mother; Healthy in his brother and sister; Hypertension in his mother; Leukemia in his father; Osteoporosis in his mother; Rheumatic fever in his brother. ROS:   Please see the history of present illness.    All 14 point review of systems negative except as described per history of present illness  EKGs/Labs/Other Studies Reviewed:      Recent Labs: No results found for requested labs within last 8760 hours.  Recent Lipid Panel No results found for: CHOL, TRIG, HDL, CHOLHDL, VLDL, LDLCALC, LDLDIRECT  Physical Exam:    VS:  BP 110/70 (BP Location: Left Arm, Patient Position: Sitting, Cuff Size: Normal)   Pulse 89   Ht 5\' 11"  (1.803 m)   Wt 157 lb (71.2 kg)   SpO2 95%   BMI 21.90 kg/m     Wt Readings from Last 3 Encounters:  03/27/19 157 lb (71.2 kg)  01/21/19 159 lb 6.4 oz (72.3 kg)  10/10/17 164 lb (74.4 kg)     GEN:  Well nourished, well developed in no acute distress HEENT: Normal NECK: No JVD; No carotid bruits LYMPHATICS: No lymphadenopathy CARDIAC: RRR, no murmurs, no rubs, no  gallops RESPIRATORY:  Clear to auscultation without rales, wheezing or rhonchi  ABDOMEN: Soft, non-tender, non-distended MUSCULOSKELETAL:  No edema; No deformity  SKIN: Warm and dry LOWER EXTREMITIES: no swelling NEUROLOGIC:  Alert and oriented x 3 PSYCHIATRIC:  Normal affect   ASSESSMENT:    1. Dyslipidemia   2. Complete heart block ([place])   3. Dilated cardiomyopathy ([place])   4. Coronary artery disease involving native coronary artery of native heart without angina pectoris   5. Peripheral vascular disease ([place])    PLAN:    In order of problems listed above:  1. Dyslipidemia is ongoing problem.  He did have problems with statin.  The only medication he was able to tolerate so far is Zetia.  His fasting lipid profile is still unacceptably high with LDL of 146.  Interestingly his HDL is 88 but considering presence of moderate coronary artery disease as well as moderate peripheral vascular disease we need to do better than this.  I will refer him to lipid clinic so he can have a conversation regarding PCSK9 agent.  He is very reluctant to take any more medications for his cholesterol however we reached the point with gradual progression of his vascular as well as coronary artery disease that serious discussion to happen about that.  He is open for suggestions.  However he told me straight that he may say no to those medications.  Also we discussed the issue of aspirin.  He does not take an aspirin because of bruising that he had and I told him that bruising is not going to kill him however if he end up having CVA or heart attack it might.  He decided to start aspirin back.  I told him if he had some difficulty with aspirin we may try Plavix however I suspect bruising will be the same with Plavix like it is with aspirin. 2. Complete heart block.  The day managed with pacemaker. 3. Dilated cardiomyopathy last echocardiogram showed normalization of left ventricle ejection fraction  4. Coronary  artery disease noncritical decreased receptors modifications as discussed above 5. Peripheral vascular disease again not enough to pursue any intervention risk factors modifications.  I spent a great deal of time talking about today I showed him his cardiac catheterization, real picture and I pointed to him narrowing that he has, I also showed him his carotid ultrasounds with the pictures so he can see by himself narrowing and plaques that I see in his carotid arteries.  I hope that will help him understand seriousness of the situation.   Medication Adjustments/Labs and Tests Ordered: Current medicines are reviewed at length with the patient today.  Concerns regarding medicines are outlined above.  Orders Placed This Encounter  Procedures  . AMB Referral to [facility] Clinic   Medication changes:  Meds ordered this encounter  Medications  . aspirin EC 81 MG tablet    Sig: Take 1 tablet (81 mg total) by mouth daily.    Dispense:  90 tablet    Refill:  3    Signed, Park Liter, MD, [facility] 03/27/2019 9:31 AM    [place]

## 2019-03-27 NOTE — Patient Instructions (Signed)
Medication Instructions:  Your physician has recommended you make the following change in your medication:  START: Aspirin 81 mg daily   *If you need a refill on your cardiac medications before your next appointment, please call your pharmacy*  Lab Work: None.  If you have labs (blood work) drawn today and your tests are completely normal, you will receive your results only by: Marland Kitchen MyChart Message (if you have MyChart) OR . A paper copy in the mail If you have any lab test that is abnormal or we need to change your treatment, we will call you to review the results.  Testing/Procedures: None.   Follow-Up: At [facility], you and your health needs are our priority.  As part of our continuing mission to provide you with exceptional heart care, we have created designated Provider Care Teams.  These Care Teams include your primary Cardiologist (physician) and Advanced Practice Providers (APPs -  Physician Assistants and Nurse Practitioners) who all work together to provide you with the care you need, when you need it.  Your next appointment:   5 month(s)  The format for your next appointment:   In Person  Provider:   Jenne Campus, MD  Other Instructions  Dr. Agustin Cree has referred you to the lipid clinic. They should call you within 1 week if not please call us

## 2019-05-05 DIAGNOSIS — F988 Other specified behavioral and emotional disorders with onset usually occurring in childhood and adolescence: Secondary | ICD-10-CM

## 2019-05-05 HISTORY — DX: Other specified behavioral and emotional disorders with onset usually occurring in childhood and adolescence: F98.8

## 2019-05-09 ENCOUNTER — Other Ambulatory Visit: Payer: Self-pay

## 2019-05-09 ENCOUNTER — Ambulatory Visit (INDEPENDENT_AMBULATORY_CARE_PROVIDER_SITE_OTHER): Payer: Medicare Other | Admitting: Internal Medicine

## 2019-05-09 ENCOUNTER — Encounter: Payer: Self-pay | Admitting: Internal Medicine

## 2019-05-09 VITALS — BP 118/72 | HR 87 | Temp 96.8°F | Ht 71.0 in | Wt 155.4 lb

## 2019-05-09 DIAGNOSIS — T466X5A Adverse effect of antihyperlipidemic and antiarteriosclerotic drugs, initial encounter: Secondary | ICD-10-CM

## 2019-05-09 DIAGNOSIS — I2510 Atherosclerotic heart disease of native coronary artery without angina pectoris: Secondary | ICD-10-CM

## 2019-05-09 DIAGNOSIS — E785 Hyperlipidemia, unspecified: Secondary | ICD-10-CM | POA: Diagnosis not present

## 2019-05-09 DIAGNOSIS — M791 Myalgia, unspecified site: Secondary | ICD-10-CM

## 2019-05-09 DIAGNOSIS — I420 Dilated cardiomyopathy: Secondary | ICD-10-CM

## 2019-05-09 NOTE — Patient Instructions (Signed)
Medication Instructions:  Dr. Debara Pickett recommends Repatha 140mg /mL (PCSK9). This is an injectable cholesterol medication self-administered once every 14 days. This medication will need prior approval with your insurance company, which we will work on. If the medication is not approved initially, we may need to do an appeal with your insurance. We will keep you updated on this process.   Administer medication in area of fatty tissue such as abdomen, outer thigh, back up of arm - and rotate site with each injection Store medication in refrigerator until ready to administer - allow to sit at room temp for 30 mins - 1 hour prior to injection Dispose of medication in a SHARPS container - your pharmacy should be able to direct you on this and proper disposal   If you need co-pay assistance, please look into the program at healthwellfoundation.org >> disease funds >> hypercholesterolemia. This is an online application or you can call to complete.   If you need a co-pay card for Repatha: [email or web address]-[email or web address] >> paying for Repatha If you need a co-pay card for Praluent: WedMap.it >> starting & paying for Praluent  *If you need a refill on your cardiac medications before your next appointment, please call your pharmacy*   Lab Work: FASTING lab work in 3-4 months to check cholesterol  Please complete lab work before your next visit   If you have labs (blood work) drawn today and your tests are completely normal, you will receive your results only by: Marland Kitchen MyChart Message (if you have MyChart) OR . A paper copy in the mail If you have any lab test that is abnormal or we need to change your treatment, we will call you to review the results.   Testing/Procedures: NONE   Follow-Up: At [facility], you and your health needs are our priority.  As part of our continuing mission to provide you with exceptional heart care, we have created designated Provider Care Teams.  These Care Teams include your primary  Cardiologist (physician) and Advanced Practice Providers (APPs -  Physician Assistants and Nurse Practitioners) who all work together to provide you with the care you need, when you need it.  We recommend signing up for the patient portal called "MyChart".  Sign up information is provided on this After Visit Summary.  MyChart is used to connect with patients for Virtual Visits (Telemedicine).  Patients are able to view lab/test results, encounter notes, upcoming appointments, etc.  Non-urgent messages can be sent to your provider as well.   To learn more about what you can do with MyChart, go to [email or web address].    Your next appointment:   3-4 month(s) - lipid clinic  The format for your next appointment:   Either In Person or Virtual  Provider:   K. Mali Hilty, MD   Other Instructions

## 2019-05-09 NOTE — Progress Notes (Signed)
LIPID CLINIC CONSULT NOTE  Chief Complaint:  Manage dyslipidemia  Primary Care Physician: Mateo Flow, MD  Primary Cardiologist:  No primary care provider on file.  HPI:  Ian Mcgee is a 74 y.o. male who is being seen today for the evaluation of dyslipidemia at the request of Park Liter, MD.  This is a pleasant 74 year old male kindly referred for evaluation and management of dyslipidemia.  Past medical history significant for cardiomyopathy which was thought to be primarily nonischemic although cardiac catheterization did show moderate nonobstructive coronary disease in 2019.  He had a history of pacemaker and underwent biventricular upgrade which she said significantly improved his symptoms.  He is also had prior syncope and collapse.  He is noted to have a dyslipidemia.  His most recent lipid showed total cholesterol 248, triglycerides 57, HDL 88 and LDL of 149.  Also, his non-HDL cholesterol is 160, which suggest that he is somewhat discordant.  His target LDL cholesterol should be less than 70 (therefore his non-HDL target would be 100).  It seems though as regardless that he has a high HDL cholesterol, his HDL may not be functional as evidenced by the fact that he has already developed moderate multivessel coronary disease.  PMHx:  Past Medical History:  Diagnosis Date  . Cardiac pacemaker 05/10/2017  . Cardiomyopathy ([place]) 05/08/2017  . Complete heart block ([place]) 06/10/2015  . Coronary artery disease 05/15/2017   40% left main, 50% LAD, 50% RCA recent cardiac catheterization from March 2019  . Dyslipidemia 05/08/2017  . Hyperlipidemia   . Nonischemic cardiomyopathy ([place]) 06/25/2017  . Pacemaker reprogramming/check 06/10/2015  . Peripheral vascular disease ([place]) 05/08/2017   Noncritical bilateral carotid arterial disease  . Status post biventricular pacemaker 07/16/2017  . Syncope and collapse     Past Surgical History:  Procedure Laterality Date  . Basel Cell  Cancer    . BIV UPGRADE N/A 06/25/2017   Procedure: BIV UPGRADE;  Surgeon: Constance Haw, MD;  Location: [place] [facility];  Service: Cardiovascular;  Laterality: N/A;  . CATARACT EXTRACTION    . INSERT / REPLACE / REMOVE PACEMAKER    . KNEE SURGERY Right    ACL Reconstruction  . LEFT HEART CATH AND CORONARY ANGIOGRAPHY N/A 05/10/2017   Procedure: LEFT HEART CATH AND CORONARY ANGIOGRAPHY;  Surgeon: [name], MD;  Location: [place] [facility];  Service: Cardiovascular;  Laterality: N/A;  . VASECTOMY      FAMHx:  Family History  Problem Relation Age of Onset  . CAD Mother   . Hypertension Mother   . Osteoporosis Mother   . CAD Father   . Leukemia Father   . Healthy Sister   . Healthy Brother   . CAD Brother   . Rheumatic fever Brother     SOCHx:   reports that he has quit smoking. His smoking use included cigarettes. He has never used smokeless tobacco. He reports current alcohol use. He reports that he does not use drugs.  ALLERGIES:  Allergies  Allergen Reactions  . Ciprofloxacin Other (See Comments)    Bleeding (intolerance)  . Crestor [Rosuvastatin Calcium] Other (See Comments)    Muscle twinges    ROS: Pertinent items noted in HPI and remainder of comprehensive ROS otherwise negative.  HOME MEDS: Current Outpatient Medications on File Prior to Visit  Medication Sig Dispense Refill  . amphetamine-dextroamphetamine (ADDERALL XR) 20 MG 24 hr capsule Take 20 mg by mouth every morning.    Marland Kitchen  aspirin EC 81 MG tablet Take 1 tablet (81 mg total) by mouth daily. 90 tablet 3  . ezetimibe (ZETIA) 10 MG tablet Take 1 tablet (10 mg total) by mouth daily. 90 tablet 1  . finasteride (PROSCAR) 5 MG tablet Take 5 mg by mouth daily.    . psyllium (METAMUCIL) 58.6 % packet Take by mouth.    . ramipril (ALTACE) 5 MG capsule TAKE 1 CAPSULE BY MOUTH TWICE DAILY EVERY DAY 180 capsule 2  . tadalafil (CIALIS) 5 MG tablet Take 5 mg by mouth daily. For enlarged prostate    .  tamsulosin (FLOMAX) 0.4 MG CAPS capsule Take 0.4 mg by mouth at bedtime.     No current facility-administered medications on file prior to visit.    LABS/IMAGING: No results found for this or any previous visit (from the past 48 hour(s)). No results found.  LIPID PANEL: No results found for: CHOL, TRIG, HDL, CHOLHDL, VLDL, LDLCALC, LDLDIRECT  WEIGHTS: Wt Readings from Last 3 Encounters:  05/09/19 155 lb 6.4 oz (70.5 kg)  03/27/19 157 lb (71.2 kg)  01/21/19 159 lb 6.4 oz (72.3 kg)    VITALS: BP 118/72   Pulse 87   Temp (!) 96.8 F (36 C)   Ht 5\' 11"  (1.803 m)   Wt 155 lb 6.4 oz (70.5 kg)   SpO2 98%   BMI 21.67 kg/m   EXAM: Deferred  EKG: Deferred  ASSESSMENT: 1. Mixed dyslipidemia, goal LDL less than 70 2. Coronary artery disease (moderate multivessel 2019) 3. History of presumed nonischemic cardiomyopathy with pacemaker, status post BiV upgrade 4. Statin intolerant-myalgias  PLAN: 1.    Mr. Maggs has a mixed dyslipidemia with target LDL less than 70.  Although he has a high HDL cholesterol has gone out HDL cholesterol is quite high at 160.  Unfortunately, he has had statin intolerance causing myalgias.  He also may be intolerant of ezetimibe.  He is an excellent candidate for PCSK9 inhibitor and we will pursue twice weekly Repatha.  We will plan a repeat lipid in 3 months and follow-up afterwards.  Thanks again for the kind referral.  Pixie Casino, MD, FACC, [place] Director of the [facility]  [facility]ovascular [facility] Reduction Clinic Diplomate of the American Board of Clinical Lipidology Attending Cardiologist  Direct Dial: [phone]  Fax: [phone]  Website:  [email or web address] 05/09/2019, 10:17 PM

## 2019-05-13 ENCOUNTER — Telehealth: Payer: Self-pay | Admitting: Internal Medicine

## 2019-05-13 MED ORDER — REPATHA SURECLICK 140 MG/ML ~~LOC~~ SOAJ: 1.0000 | SUBCUTANEOUS | 11 refills | Status: DC

## 2019-05-13 NOTE — Telephone Encounter (Signed)
-----   Message from Fidel Levy, RN sent at 05/09/2019  9:08 AM EDT ----- Regarding: repatha PA ID: [id number] BIN: [id number] PCN: [id number] RxGrp: [id number]

## 2019-05-13 NOTE — Telephone Encounter (Signed)
Request Reference Number: [id number]. REPATHA SURE INJ 140MG /ML is approved through 11/13/2019. Your patient may now fill this prescription and it will be covered.

## 2019-05-13 NOTE — Telephone Encounter (Signed)
PA for repatha sureclick submitted via CMM (Key: [id number])

## 2019-05-16 ENCOUNTER — Telehealth: Payer: Self-pay | Admitting: Medical

## 2019-05-16 NOTE — Telephone Encounter (Signed)
   Patient called the after hours line to report concerns for an allergic reaction to repatha. He took his first dose of repatha at [facility] a sting at injection site 30-40 min later followed by headache, lightheadedness, tingling in hands/feet, and odd taste in mouth. No significant swelling of the mouth, tongue, or throat, and no complaints of SOB or rash. Recommended taking benadryl now for a possible reaction. We discussed ED precautions should he develop any of the above symptoms.   He reports BP has been elevated this evening to 168/95. He is due for his ramapril and will take it a bit early tonight.   Will route this to Dr. Debara Pickett and Dr. Agustin Cree as an Juluis [place]ier.   Abigail Butts, PA-C 05/16/19; 6:06 PM

## 2019-05-16 NOTE — Telephone Encounter (Signed)
Thanks .. nothing we can do now but wait - symptoms are unusual. We may re-challenge him in 2 weeks.  -Mali

## 2019-06-23 ENCOUNTER — Ambulatory Visit (INDEPENDENT_AMBULATORY_CARE_PROVIDER_SITE_OTHER): Payer: Medicare Other | Admitting: *Deleted

## 2019-06-23 DIAGNOSIS — I442 Atrioventricular block, complete: Secondary | ICD-10-CM

## 2019-06-23 LAB — CUP PACEART REMOTE DEVICE CHECK
Battery Remaining Longevity: 107 mo
Battery Remaining Percentage: 95.5 %
Battery Voltage: 2.99 V
Brady Statistic AP VP Percent: 1 %
Brady Statistic AP VS Percent: 1 %
Brady Statistic AS VP Percent: 99 %
Brady Statistic AS VS Percent: 1 %
Brady Statistic RA Percent Paced: 1 %
Date Time Interrogation Session: 20210510024511
Implantable Lead Implant Date: 20130812
Implantable Lead Implant Date: 20190513
Implantable Lead Implant Date: 20190513
Implantable Lead Location: 753858
Implantable Lead Location: 753859
Implantable Lead Location: 753860
Implantable Pulse Generator Implant Date: 20190513
Lead Channel Impedance Value: 330 Ohm
Lead Channel Impedance Value: 360 Ohm
Lead Channel Impedance Value: 990 Ohm
Lead Channel Pacing Threshold Amplitude: 0.75 V
Lead Channel Pacing Threshold Amplitude: 1 V
Lead Channel Pacing Threshold Amplitude: 1.125 V
Lead Channel Pacing Threshold Pulse Width: 0.2 ms
Lead Channel Pacing Threshold Pulse Width: 0.4 ms
Lead Channel Pacing Threshold Pulse Width: 0.6 ms
Lead Channel Sensing Intrinsic Amplitude: 12 mV
Lead Channel Sensing Intrinsic Amplitude: 5 mV
Lead Channel Setting Pacing Amplitude: 1.75 V
Lead Channel Setting Pacing Amplitude: 2 V
Lead Channel Setting Pacing Amplitude: 2.125
Lead Channel Setting Pacing Pulse Width: 0.4 ms
Lead Channel Setting Pacing Pulse Width: 0.6 ms
Lead Channel Setting Sensing Sensitivity: 4 mV
Pulse Gen Model: 3562
Pulse Gen Serial Number: 9431568

## 2019-06-24 NOTE — Progress Notes (Signed)
Remote pacemaker transmission.   

## 2019-09-22 ENCOUNTER — Ambulatory Visit (INDEPENDENT_AMBULATORY_CARE_PROVIDER_SITE_OTHER): Payer: Medicare Other | Admitting: *Deleted

## 2019-09-22 DIAGNOSIS — I428 Other cardiomyopathies: Secondary | ICD-10-CM

## 2019-09-24 LAB — CUP PACEART REMOTE DEVICE CHECK
Battery Remaining Longevity: 106 mo
Battery Remaining Percentage: 95.5 %
Battery Voltage: 2.99 V
Brady Statistic AP VP Percent: 1 %
Brady Statistic AP VS Percent: 1 %
Brady Statistic AS VP Percent: 98 %
Brady Statistic AS VS Percent: 1 %
Brady Statistic RA Percent Paced: 1 %
Date Time Interrogation Session: 20210810072746
Implantable Lead Implant Date: 20130812
Implantable Lead Implant Date: 20190513
Implantable Lead Implant Date: 20190513
Implantable Lead Location: 753858
Implantable Lead Location: 753859
Implantable Lead Location: 753860
Implantable Pulse Generator Implant Date: 20190513
Lead Channel Impedance Value: 310 Ohm
Lead Channel Impedance Value: 330 Ohm
Lead Channel Impedance Value: 910 Ohm
Lead Channel Pacing Threshold Amplitude: 0.875 V
Lead Channel Pacing Threshold Amplitude: 1 V
Lead Channel Pacing Threshold Amplitude: 1 V
Lead Channel Pacing Threshold Pulse Width: 0.2 ms
Lead Channel Pacing Threshold Pulse Width: 0.4 ms
Lead Channel Pacing Threshold Pulse Width: 0.6 ms
Lead Channel Sensing Intrinsic Amplitude: 12 mV
Lead Channel Sensing Intrinsic Amplitude: 5 mV
Lead Channel Setting Pacing Amplitude: 2 V
Lead Channel Setting Pacing Amplitude: 2 V
Lead Channel Setting Pacing Amplitude: 2 V
Lead Channel Setting Pacing Pulse Width: 0.4 ms
Lead Channel Setting Pacing Pulse Width: 0.6 ms
Lead Channel Setting Sensing Sensitivity: 4 mV
Pulse Gen Model: 3562
Pulse Gen Serial Number: 9431568

## 2019-09-25 NOTE — Progress Notes (Signed)
Remote pacemaker transmission.   

## 2019-10-16 NOTE — Telephone Encounter (Signed)
Pixie Casino, MD  You 5 minutes ago (1:30 PM)   I recall being notified of this - I was not sure it was an allergic reaction - may have injected a nerve that caused his symptoms. He was advised to stop if for 2 weeks - I was thinking we may have him try it again, but it seems he was told not to use it. We could re-try it or try Praluent instead - or discuss other options. Did he try to contact us in the past few months since he did not hear anything?

## 2019-12-22 ENCOUNTER — Ambulatory Visit (INDEPENDENT_AMBULATORY_CARE_PROVIDER_SITE_OTHER): Payer: Medicare Other

## 2019-12-22 DIAGNOSIS — I442 Atrioventricular block, complete: Secondary | ICD-10-CM

## 2019-12-25 LAB — CUP PACEART REMOTE DEVICE CHECK
Battery Remaining Longevity: 103 mo
Battery Remaining Percentage: 95.5 %
Battery Voltage: 2.99 V
Brady Statistic AP VP Percent: 1 %
Brady Statistic AP VS Percent: 1 %
Brady Statistic AS VP Percent: 98 %
Brady Statistic AS VS Percent: 1 %
Brady Statistic RA Percent Paced: 1 %
Date Time Interrogation Session: 20211110231044
Implantable Lead Implant Date: 20130812
Implantable Lead Implant Date: 20190513
Implantable Lead Implant Date: 20190513
Implantable Lead Location: 753858
Implantable Lead Location: 753859
Implantable Lead Location: 753860
Implantable Pulse Generator Implant Date: 20190513
Lead Channel Impedance Value: 300 Ohm
Lead Channel Impedance Value: 330 Ohm
Lead Channel Impedance Value: 840 Ohm
Lead Channel Pacing Threshold Amplitude: 0.875 V
Lead Channel Pacing Threshold Amplitude: 1 V
Lead Channel Pacing Threshold Amplitude: 1.125 V
Lead Channel Pacing Threshold Pulse Width: 0.2 ms
Lead Channel Pacing Threshold Pulse Width: 0.4 ms
Lead Channel Pacing Threshold Pulse Width: 0.6 ms
Lead Channel Sensing Intrinsic Amplitude: 12 mV
Lead Channel Sensing Intrinsic Amplitude: 5 mV
Lead Channel Setting Pacing Amplitude: 1.875
Lead Channel Setting Pacing Amplitude: 2 V
Lead Channel Setting Pacing Amplitude: 2.125
Lead Channel Setting Pacing Pulse Width: 0.4 ms
Lead Channel Setting Pacing Pulse Width: 0.6 ms
Lead Channel Setting Sensing Sensitivity: 4 mV
Pulse Gen Model: 3562
Pulse Gen Serial Number: 9431568

## 2019-12-26 NOTE — Progress Notes (Signed)
Remote pacemaker transmission.   

## 2020-01-20 ENCOUNTER — Telehealth: Payer: Self-pay | Admitting: Cardiology

## 2020-01-20 DIAGNOSIS — I2510 Atherosclerotic heart disease of native coronary artery without angina pectoris: Secondary | ICD-10-CM

## 2020-01-20 DIAGNOSIS — I429 Cardiomyopathy, unspecified: Secondary | ICD-10-CM

## 2020-01-20 NOTE — Telephone Encounter (Signed)
Orders placed and sent to scheduling to be scheduled.

## 2020-01-20 NOTE — Telephone Encounter (Signed)
I already responded to it he is carotic ultrasounds as well as echocardiogram before next visit

## 2020-01-20 NOTE — Telephone Encounter (Signed)
Called pt to schedule appt. He stated he would like to have a carotid doppler done before his next appt in January.  Please advise.

## 2020-02-16 DIAGNOSIS — J189 Pneumonia, unspecified organism: Secondary | ICD-10-CM | POA: Diagnosis not present

## 2020-02-17 ENCOUNTER — Ambulatory Visit: Payer: Medicare Other | Admitting: Cardiology

## 2020-02-19 ENCOUNTER — Other Ambulatory Visit: Payer: Medicare Other

## 2020-03-08 DIAGNOSIS — D519 Vitamin B12 deficiency anemia, unspecified: Secondary | ICD-10-CM | POA: Diagnosis not present

## 2020-03-10 ENCOUNTER — Ambulatory Visit: Payer: Medicare Other | Admitting: Cardiology

## 2020-03-15 ENCOUNTER — Ambulatory Visit (INDEPENDENT_AMBULATORY_CARE_PROVIDER_SITE_OTHER): Payer: Medicare Other

## 2020-03-15 ENCOUNTER — Other Ambulatory Visit: Payer: Self-pay

## 2020-03-15 DIAGNOSIS — I429 Cardiomyopathy, unspecified: Secondary | ICD-10-CM

## 2020-03-15 DIAGNOSIS — I6523 Occlusion and stenosis of bilateral carotid arteries: Secondary | ICD-10-CM | POA: Diagnosis not present

## 2020-03-15 DIAGNOSIS — I2510 Atherosclerotic heart disease of native coronary artery without angina pectoris: Secondary | ICD-10-CM | POA: Diagnosis not present

## 2020-03-15 LAB — ECHOCARDIOGRAM COMPLETE
Calc EF: 44 %
S' Lateral: 4 cm
Single Plane A2C EF: 45.4 %
Single Plane A4C EF: 48.1 %

## 2020-03-15 NOTE — Progress Notes (Signed)
Carotid duplex exam performed  Jimmy Diem Dicocco RDCS, RVT 

## 2020-03-15 NOTE — Progress Notes (Signed)
Complete echocardiogram performed.  Jimmy Dagan Heinz RDCS, RVT  

## 2020-03-16 ENCOUNTER — Other Ambulatory Visit: Payer: Self-pay

## 2020-03-16 ENCOUNTER — Telehealth: Payer: Self-pay

## 2020-03-16 DIAGNOSIS — E785 Hyperlipidemia, unspecified: Secondary | ICD-10-CM | POA: Insufficient documentation

## 2020-03-16 DIAGNOSIS — R55 Syncope and collapse: Secondary | ICD-10-CM | POA: Insufficient documentation

## 2020-03-16 NOTE — Telephone Encounter (Signed)
Patient notified of results.

## 2020-03-16 NOTE — Telephone Encounter (Signed)
-----   Message from Park Liter, MD sent at 03/16/2020 12:43 PM EST ----- Carotic ultrasound showed up to 60% stenosis both sides.  It is unchanged as compared to prior study.

## 2020-03-18 ENCOUNTER — Encounter: Payer: Self-pay | Admitting: Cardiology

## 2020-03-18 ENCOUNTER — Ambulatory Visit (INDEPENDENT_AMBULATORY_CARE_PROVIDER_SITE_OTHER): Payer: Medicare Other | Admitting: Cardiology

## 2020-03-18 ENCOUNTER — Other Ambulatory Visit: Payer: Self-pay

## 2020-03-18 VITALS — BP 140/78 | HR 91 | Ht 71.0 in | Wt 160.0 lb

## 2020-03-18 DIAGNOSIS — E785 Hyperlipidemia, unspecified: Secondary | ICD-10-CM | POA: Diagnosis not present

## 2020-03-18 DIAGNOSIS — I2510 Atherosclerotic heart disease of native coronary artery without angina pectoris: Secondary | ICD-10-CM

## 2020-03-18 DIAGNOSIS — Z950 Presence of cardiac pacemaker: Secondary | ICD-10-CM

## 2020-03-18 DIAGNOSIS — I739 Peripheral vascular disease, unspecified: Secondary | ICD-10-CM

## 2020-03-18 DIAGNOSIS — I428 Other cardiomyopathies: Secondary | ICD-10-CM | POA: Diagnosis not present

## 2020-03-18 NOTE — Progress Notes (Signed)
Cardiology Office Note:    Date:  03/18/2020   ID:  ANSELMO REIHL, DOB 10-25-1945, MRN [id number]  PCP:  Mateo Flow, MD  Cardiologist:  Jenne Campus, MD    Referring MD: Mateo Flow, MD   Chief Complaint  Patient presents with  . Results  . Follow-up  I am doing fine  History of Present Illness:    Ian Mcgee is a 75 y.o. male with past medical history significant for complete heart block that required dual-chamber pacing which was done couple years ago after that he developed cardiomyopathy with significantly reduced left ventricle ejection fraction, cardiac catheterization with only 2019 to rule out ischemia as potential reason for his cardiomyopathy which showed 50% left anterior descending 50% right coronary artery.  After that he had upgraded pacing system to BiV pacing which resulted in improvement left ventricle ejection fraction to normalization.  However, repeated echocardiogram done this year showed deterioration of left ventricle ejection fraction to about 45%.  He also got history of peripheral vascular disease in form of carotic arterial disease with up to 60% narrowing.  He is not on any statin, he did try PCSK9 agent but developed some side effect and then not on it.  He still doing well he said he is very active he is able to exercise play tennis walk he said not as good as before but overall considering his age he said he is doing well he tell me when he compares himself with somebody his age who does not exercise he is much better, however if he can per some based on his age who exercise on the regular basis he is not as good.  Past Medical History:  Diagnosis Date  . Attention deficit disorder (ADD) 05/05/2019  . Cardiac pacemaker 05/10/2017  . Cardiomyopathy ([place]) 05/08/2017  . Complete heart block ([place]) 06/10/2015  . Coronary artery disease 05/15/2017   40% left main, 50% LAD, 50% RCA recent cardiac catheterization from March 2019  . Dyslipidemia  05/08/2017  . Hyperlipidemia   . Nonischemic cardiomyopathy ([place]) 06/25/2017  . Pacemaker reprogramming/check 06/10/2015  . Peripheral vascular disease ([place]) 05/08/2017   Noncritical bilateral carotid arterial disease  . Pneumonia   . Status post biventricular pacemaker 07/16/2017  . Syncope and collapse     Past Surgical History:  Procedure Laterality Date  . Basel Cell Cancer    . BIV UPGRADE N/A 06/25/2017   Procedure: BIV UPGRADE;  Surgeon: Constance Haw, MD;  Location: [facility];  Service: Cardiovascular;  Laterality: N/A;  . CATARACT EXTRACTION    . INSERT / REPLACE / REMOVE PACEMAKER    . KNEE SURGERY Right    ACL Reconstruction  . LEFT HEART CATH AND CORONARY ANGIOGRAPHY N/A 05/10/2017   Procedure: LEFT HEART CATH AND CORONARY ANGIOGRAPHY;  Surgeon: [name], MD;  Location: [facility];  Service: Cardiovascular;  Laterality: N/A;  . VASECTOMY      Current Medications: Current Meds  Medication Sig  . amphetamine-dextroamphetamine (ADDERALL XR) 20 MG 24 hr capsule Take 20 mg by mouth every morning.  . B-12, Methylcobalamin, 1000 MCG SUBL Place 1 tablet under the tongue daily.  . Multiple Vitamin (MULTIVITAMIN) capsule Take 1 capsule by mouth daily.  . psyllium (METAMUCIL) 58.6 % packet Take by mouth.  . tadalafil (CIALIS) 10 MG tablet Take 10 mg by mouth daily as needed.     Allergies:   Ciprofloxacin and Crestor [rosuvastatin calcium]   Social  History   Socioeconomic History  . Marital status: Married    Spouse name: Not on file  . Number of children: Not on file  . Years of education: Not on file  . Highest education level: Not on file  Occupational History  . Not on file  Tobacco Use  . Smoking status: Former Smoker    Types: Cigarettes  . Smokeless tobacco: Never Used  Vaping Use  . Vaping Use: Never used  Substance and Sexual Activity  . Alcohol use: Yes    Comment: Wine with dinner  . Drug use: Never  . Sexual activity: Not  on file  Other Topics Concern  . Not on file  Social History Narrative  . Not on file   Social Determinants of Health   Financial Resource Strain: Not on file  Food Insecurity: Not on file  Transportation Needs: Not on file  Physical Activity: Not on file  Stress: Not on file  Social Connections: Not on file     Family History: The patient's family history includes CAD in his brother, father, and mother; Healthy in his brother and sister; Hypertension in his mother; Leukemia in his father; Osteoporosis in his mother; Rheumatic fever in his brother. ROS:   Please see the history of present illness.    All 14 point review of systems negative except as described per history of present illness  EKGs/Labs/Other Studies Reviewed:      Recent Labs: No results found for requested labs within last 8760 hours.  Recent Lipid Panel No results found for: CHOL, TRIG, HDL, CHOLHDL, VLDL, LDLCALC, LDLDIRECT  Physical Exam:    VS:  BP 140/78 (BP Location: Left Arm, Patient Position: Sitting)   Pulse 91   Ht 5\' 11"  (1.803 m)   Wt 160 lb (72.6 kg)   SpO2 98%   BMI 22.32 kg/m     Wt Readings from Last 3 Encounters:  03/18/20 160 lb (72.6 kg)  05/09/19 155 lb 6.4 oz (70.5 kg)  03/27/19 157 lb (71.2 kg)     GEN:  Well nourished, well developed in no acute distress HEENT: Normal NECK: No JVD; No carotid bruits LYMPHATICS: No lymphadenopathy CARDIAC: RRR, no murmurs, no rubs, no gallops RESPIRATORY:  Clear to auscultation without rales, wheezing or rhonchi  ABDOMEN: Soft, non-tender, non-distended MUSCULOSKELETAL:  No edema; No deformity  SKIN: Warm and dry LOWER EXTREMITIES: no swelling NEUROLOGIC:  Alert and oriented x 3 PSYCHIATRIC:  Normal affect   ASSESSMENT:    1. Nonischemic cardiomyopathy (HCC)   2. Coronary artery disease involving native coronary artery of native heart without angina pectoris   3. Peripheral vascular disease (HCC)   4. Status post biventricular  pacemaker   5. Dyslipidemia    PLAN:    In order of problems listed above:  1. Nonischemic cardiomyopathy we had a long discussion about this.  He did have intolerance to beta-blockers before therefore we will not switch to that class of medication however Sherryll Burger will be beneficial.  I explained to him the rationale of using this medication he is willing to try.  I will check his Chem-7 if Chem-7 is normal then will put him on Entresto 24/26 twice daily and then recheck Chem-7 within next few days.  Goal will be to upgrade therapy to reasonable dosages. 2. Coronary disease with a multiple noncritical stenosis.  He is not having any symptoms.  He is not on antiplatelet therapy we discussed that he should he does not want to  take aspirin or Plavix secondary to easy bruising he said he simply does not want to do it. 3. Peripheral vascular disease likely carotic ultrasounds revealed no progression of the problem, stable with continue management. 4. Biventricular pacemaker that being followed by our EP team.  I did review interrogation of the device which showed 98% BiV pacing. 5. Dyslipidemia he does have dyslipidemia we try all different medication, he is intolerant to statin, we did try PCSK9 agent also intolerant to it.  We discussed the issue of different medication like Nexletol.  He said he will be willing to try but refers he would like to try make sure he tolerates Entresto well.   Medication Adjustments/Labs and Tests Ordered: Current medicines are reviewed at length with the patient today.  Concerns regarding medicines are outlined above.  Orders Placed This Encounter  Procedures  . Basic metabolic panel  . EKG 12-Lead   Medication changes: No orders of the defined types were placed in this encounter.   Signed, Park Liter, MD, [facility] 03/18/2020 11:44 AM    [place]

## 2020-03-18 NOTE — Progress Notes (Signed)
bmp 

## 2020-03-18 NOTE — Patient Instructions (Signed)
Medication Instructions:  Your physician recommends that you continue on your current medications as directed. Please refer to the Current Medication list given to you today.  *If you need a refill on your cardiac medications before your next appointment, please call your pharmacy*   Lab Work: Your physician recommends that you return for lab work today: bmp   If you have labs (blood work) drawn today and your tests are completely normal, you will receive your results only by: . MyChart Message (if you have MyChart) OR . A paper copy in the mail If you have any lab test that is abnormal or we need to change your treatment, we will call you to review the results.   Testing/Procedures: None   Follow-Up: At [facility], you and your health needs are our priority.  As part of our continuing mission to provide you with exceptional heart care, we have created designated Provider Care Teams.  These Care Teams include your primary Cardiologist (physician) and Advanced Practice Providers (APPs -  Physician Assistants and Nurse Practitioners) who all work together to provide you with the care you need, when you need it.  We recommend signing up for the patient portal called "MyChart".  Sign up information is provided on this After Visit Summary.  MyChart is used to connect with patients for Virtual Visits (Telemedicine).  Patients are able to view lab/test results, encounter notes, upcoming appointments, etc.  Non-urgent messages can be sent to your provider as well.   To learn more about what you can do with MyChart, go to [email or web address].    Your next appointment:   6 week(s)  The format for your next appointment:   In Person  Provider:   Alyus Krasowski, MD   Other Instructions   

## 2020-03-19 ENCOUNTER — Telehealth: Payer: Self-pay

## 2020-03-19 DIAGNOSIS — E785 Hyperlipidemia, unspecified: Secondary | ICD-10-CM

## 2020-03-19 DIAGNOSIS — E782 Mixed hyperlipidemia: Secondary | ICD-10-CM

## 2020-03-19 LAB — BASIC METABOLIC PANEL
BUN/Creatinine Ratio: 21 (ref 10–24)
BUN: 17 mg/dL (ref 8–27)
CO2: 25 mmol/L (ref 20–29)
Calcium: 9 mg/dL (ref 8.6–10.2)
Chloride: 105 mmol/L (ref 96–106)
Creatinine, Ser: 0.82 mg/dL (ref 0.76–1.27)
GFR calc Af Amer: 101 mL/min/{1.73_m2} (ref >59)
GFR calc non Af Amer: 87 mL/min/{1.73_m2} (ref >59)
Glucose: 91 mg/dL (ref 65–99)
Potassium: 4.6 mmol/L (ref 3.5–5.2)
Sodium: 141 mmol/L (ref 134–144)

## 2020-03-19 MED ORDER — ENTRESTO 24-26 MG PO TABS: 1.0000 | ORAL_TABLET | Freq: Two times a day (BID) | ORAL | 1 refills | Status: DC

## 2020-03-19 NOTE — Telephone Encounter (Signed)
-----   Message from Park Liter, MD sent at 03/19/2020 11:38 AM EST ----- It is okay to start with Entresto 24/26 twice daily, Chem-7 need to be repeated in 1 week

## 2020-03-19 NOTE — Telephone Encounter (Signed)
Spoke with the patient and he agrees with Dr. Agustin Cree plan. Rx sent. Patient aware no appt needed for labs.

## 2020-03-22 ENCOUNTER — Ambulatory Visit (INDEPENDENT_AMBULATORY_CARE_PROVIDER_SITE_OTHER): Payer: Medicare Other

## 2020-03-22 DIAGNOSIS — I442 Atrioventricular block, complete: Secondary | ICD-10-CM | POA: Diagnosis not present

## 2020-03-26 LAB — CUP PACEART REMOTE DEVICE CHECK
Battery Remaining Longevity: 102 mo
Battery Remaining Percentage: 95.5 %
Battery Voltage: 2.99 V
Brady Statistic AP VP Percent: 1 %
Brady Statistic AP VS Percent: 1 %
Brady Statistic AS VP Percent: 98 %
Brady Statistic AS VS Percent: 1 %
Brady Statistic RA Percent Paced: 1 %
Date Time Interrogation Session: 20220211101016
Implantable Lead Implant Date: 20130812
Implantable Lead Implant Date: 20190513
Implantable Lead Implant Date: 20190513
Implantable Lead Location: 753858
Implantable Lead Location: 753859
Implantable Lead Location: 753860
Implantable Pulse Generator Implant Date: 20190513
Lead Channel Impedance Value: 290 Ohm
Lead Channel Impedance Value: 330 Ohm
Lead Channel Impedance Value: 840 Ohm
Lead Channel Pacing Threshold Amplitude: 0.875 V
Lead Channel Pacing Threshold Amplitude: 0.875 V
Lead Channel Pacing Threshold Amplitude: 1.125 V
Lead Channel Pacing Threshold Pulse Width: 0.2 ms
Lead Channel Pacing Threshold Pulse Width: 0.4 ms
Lead Channel Pacing Threshold Pulse Width: 0.6 ms
Lead Channel Sensing Intrinsic Amplitude: 12 mV
Lead Channel Sensing Intrinsic Amplitude: 5 mV
Lead Channel Setting Pacing Amplitude: 1.875
Lead Channel Setting Pacing Amplitude: 2 V
Lead Channel Setting Pacing Amplitude: 2.125
Lead Channel Setting Pacing Pulse Width: 0.4 ms
Lead Channel Setting Pacing Pulse Width: 0.6 ms
Lead Channel Setting Sensing Sensitivity: 4 mV
Pulse Gen Model: 3562
Pulse Gen Serial Number: 9431568

## 2020-03-29 NOTE — Progress Notes (Signed)
Remote pacemaker transmission.   

## 2020-04-30 DIAGNOSIS — J189 Pneumonia, unspecified organism: Secondary | ICD-10-CM | POA: Insufficient documentation

## 2020-05-03 ENCOUNTER — Other Ambulatory Visit: Payer: Self-pay

## 2020-05-03 ENCOUNTER — Telehealth: Payer: Self-pay

## 2020-05-03 ENCOUNTER — Encounter: Payer: Self-pay | Admitting: Cardiology

## 2020-05-03 ENCOUNTER — Ambulatory Visit (INDEPENDENT_AMBULATORY_CARE_PROVIDER_SITE_OTHER): Payer: Medicare Other | Admitting: Cardiology

## 2020-05-03 VITALS — BP 130/76 | HR 91 | Ht 71.0 in | Wt 160.0 lb

## 2020-05-03 DIAGNOSIS — I2510 Atherosclerotic heart disease of native coronary artery without angina pectoris: Secondary | ICD-10-CM

## 2020-05-03 DIAGNOSIS — I739 Peripheral vascular disease, unspecified: Secondary | ICD-10-CM | POA: Diagnosis not present

## 2020-05-03 DIAGNOSIS — I420 Dilated cardiomyopathy: Secondary | ICD-10-CM | POA: Diagnosis not present

## 2020-05-03 DIAGNOSIS — E785 Hyperlipidemia, unspecified: Secondary | ICD-10-CM

## 2020-05-03 MED ORDER — NEXLETOL 180 MG PO TABS: 180.0000 mg | ORAL_TABLET | Freq: Every day | ORAL | 1 refills | Status: DC

## 2020-05-03 NOTE — Progress Notes (Signed)
Cardiology Office Note:    Date:  05/03/2020   ID:  Ian Mcgee, DOB 01/14/1946, MRN [id number]  PCP:  Mateo Flow, MD  Cardiologist:  Jenne Campus, MD    Referring MD: Mateo Flow, MD   Chief Complaint  Patient presents with  . Follow-up  Am doing well  History of Present Illness:    Ian Mcgee is a 75 y.o. male with past medical history significant for cardiomyopathy with latest ejection fraction 45%, also coronary artery disease with cardiac catheterization years ago showing 50% left anterior descending artery 50% right coronary artery, does have peripheral vascular disease in form of carotic arterial disease up to 60%.  He is intolerant to multiple statins as well as multiple medication for cholesterol.  Also recently I tried to put him on Entresto however have intolerance to it and was unable to take it. Comes today to my office for follow-up overall doing well.  Still playing tennis trying to exercise described to have some shortness of breath however nothing worsening.  Today he does have some irregular heart rate and a EKG will be done.  Past Medical History:  Diagnosis Date  . Attention deficit disorder (ADD) 05/05/2019  . Cardiac pacemaker 05/10/2017  . Cardiomyopathy ([place]) 05/08/2017  . Cardiomyopathy ([place]) 05/08/2017  . Complete heart block ([place]) 06/10/2015  . Coronary artery disease 05/15/2017   40% left main, 50% LAD, 50% RCA recent cardiac catheterization from March 2019  . Dyslipidemia 05/08/2017  . Hyperlipidemia   . Nonischemic cardiomyopathy ([place]) 06/25/2017  . Pacemaker reprogramming/check 06/10/2015  . Peripheral vascular disease ([place]) 05/08/2017   Noncritical bilateral carotid arterial disease  . Pneumonia   . Status post biventricular pacemaker 07/16/2017  . Syncope and collapse     Past Surgical History:  Procedure Laterality Date  . Basel Cell Cancer    . BIV UPGRADE N/A 06/25/2017   Procedure: BIV UPGRADE;  Surgeon: Constance Haw,  MD;  Location: [facility];  Service: Cardiovascular;  Laterality: N/A;  . CATARACT EXTRACTION    . INSERT / REPLACE / REMOVE PACEMAKER    . KNEE SURGERY Right    ACL Reconstruction  . LEFT HEART CATH AND CORONARY ANGIOGRAPHY N/A 05/10/2017   Procedure: LEFT HEART CATH AND CORONARY ANGIOGRAPHY;  Surgeon: [name], MD;  Location: [facility];  Service: Cardiovascular;  Laterality: N/A;  . VASECTOMY      Current Medications: Current Meds  Medication Sig  . amphetamine-dextroamphetamine (ADDERALL XR) 20 MG 24 hr capsule Take 20 mg by mouth every morning.  . B-12, Methylcobalamin, 1000 MCG SUBL Place 1 tablet under the tongue daily.  . Multiple Vitamin (MULTIVITAMIN) capsule Take 1 capsule by mouth daily.  . psyllium (METAMUCIL) 58.6 % packet Take by mouth.  . tadalafil (CIALIS) 10 MG tablet Take 10 mg by mouth daily as needed.     Allergies:   Ciprofloxacin and Crestor [rosuvastatin calcium]   Social History   Socioeconomic History  . Marital status: Married    Spouse name: Not on file  . Number of children: Not on file  . Years of education: Not on file  . Highest education level: Not on file  Occupational History  . Not on file  Tobacco Use  . Smoking status: Former Smoker    Types: Cigarettes  . Smokeless tobacco: Never Used  Vaping Use  . Vaping Use: Never used  Substance and Sexual Activity  . Alcohol use: Yes  Comment: Wine with dinner  . Drug use: Never  . Sexual activity: Not on file  Other Topics Concern  . Not on file  Social History Narrative  . Not on file   Social Determinants of Health   Financial Resource Strain: Not on file  Food Insecurity: Not on file  Transportation Needs: Not on file  Physical Activity: Not on file  Stress: Not on file  Social Connections: Not on file     Family History: The patient's family history includes CAD in his brother, father, and mother; Healthy in his brother and sister; Hypertension in his  mother; Leukemia in his father; Osteoporosis in his mother; Rheumatic fever in his brother. ROS:   Please see the history of present illness.    All 14 point review of systems negative except as described per history of present illness  EKGs/Labs/Other Studies Reviewed:      Recent Labs: 03/18/2020: BUN 17; Creatinine, Ser 0.82; Potassium 4.6; Sodium 141  Recent Lipid Panel No results found for: CHOL, TRIG, HDL, CHOLHDL, VLDL, LDLCALC, LDLDIRECT  Physical Exam:    VS:  BP 130/76 (BP Location: Right Arm, Patient Position: Sitting)   Pulse 91   Ht 5\' 11"  (1.803 m)   Wt 160 lb (72.6 kg)   SpO2 95%   BMI 22.32 kg/m     Wt Readings from Last 3 Encounters:  05/03/20 160 lb (72.6 kg)  03/18/20 160 lb (72.6 kg)  05/09/19 155 lb 6.4 oz (70.5 kg)     GEN:  Well nourished, well developed in no acute distress HEENT: Normal NECK: No JVD; No carotid bruits LYMPHATICS: No lymphadenopathy CARDIAC: Irregular, systolic murmur grade 1/6 to 2/6 best heard left border of sternum, no rubs, no gallops RESPIRATORY:  Clear to auscultation without rales, wheezing or rhonchi  ABDOMEN: Soft, non-tender, non-distended MUSCULOSKELETAL:  No edema; No deformity  SKIN: Warm and dry LOWER EXTREMITIES: no swelling NEUROLOGIC:  Alert and oriented x 3 PSYCHIATRIC:  Normal affect   ASSESSMENT:    1. Dilated cardiomyopathy ([place])   2. Peripheral vascular disease ([place])   3. Coronary artery disease involving native coronary artery of native heart without angina pectoris   4. Dyslipidemia    PLAN:    In order of problems listed above:  1. Dilated cardiomyopathy.  High unable to tolerate Entresto.  I offer him losartan.  He thinks he tried this medication before had some difficulties.  We will investigate that. 2. Peripheral vascular disease, last test done in January this year which showed no worsening of the problem.  We will continue risk factors modifications. 3. Coronary disease stable denies having  chest pain tightness squeezing pressure burning chest. 4. Dyslipidemia he is willing to try Nexletol which we will do.  Hopefully, he will be able to tolerate that medication.   Medication Adjustments/Labs and Tests Ordered: Current medicines are reviewed at length with the patient today.  Concerns regarding medicines are outlined above.  No orders of the defined types were placed in this encounter.  Medication changes: No orders of the defined types were placed in this encounter.   Signed, Park Liter, MD, [facility] 05/03/2020 9:10 AM    [place]

## 2020-05-03 NOTE — Telephone Encounter (Signed)
PA started on CMM for Nexletol 180 mg. [facility]

## 2020-05-03 NOTE — Patient Instructions (Signed)
Medication Instructions:  Your physician has recommended you make the following change in your medication:   START: Nexletol 180 mg daily.   *If you need a refill on your cardiac medications before your next appointment, please call your pharmacy*   Lab Work: None If you have labs (blood work) drawn today and your tests are completely normal, you will receive your results only by: Marland Kitchen MyChart Message (if you have MyChart) OR . A paper copy in the mail If you have any lab test that is abnormal or we need to change your treatment, we will call you to review the results.   Testing/Procedures: None   Follow-Up: At [facility], you and your health needs are our priority.  As part of our continuing mission to provide you with exceptional heart care, we have created designated Provider Care Teams.  These Care Teams include your primary Cardiologist (physician) and Advanced Practice Providers (APPs -  Physician Assistants and Nurse Practitioners) who all work together to provide you with the care you need, when you need it.  We recommend signing up for the patient portal called "MyChart".  Sign up information is provided on this After Visit Summary.  MyChart is used to connect with patients for Virtual Visits (Telemedicine).  Patients are able to view lab/test results, encounter notes, upcoming appointments, etc.  Non-urgent messages can be sent to your provider as well.   To learn more about what you can do with MyChart, go to [email or web address].    Your next appointment:   3 month(s)  The format for your next appointment:   In Person  Provider:   Jenne Campus, MD   Other Instructions  Bempedoic acid tablets What is this medicine? Bempedoic acid (BEM pe DOE ik AS id) is used to lower the level of cholesterol in the blood. It is used with other cholesterol-lowering drugs. This medicine may be used for other purposes; ask your health care provider or pharmacist if you have  questions. COMMON BRAND NAME(S): NEXLETOL What should I tell my health care provider before I take this medicine? They need to know if you have any of these conditions:  gout  kidney problems  liver problems  tendon problems  an unusual or allergic reaction to bempedoic acid, other medicines, foods, dyes, or preservatives  pregnant or trying to become pregnant  breast-feeding How should I use this medicine? Take this medicine by mouth with a glass of water. Follow the directions on the prescription label. You can take it with or without food. If it upsets your stomach, take it with food. Take your doses at regular intervals. Do not take your medicine more often than directed. Talk to your pediatrician about the use of this medicine in children. Special care may be needed. Overdosage: If you think you have taken too much of this medicine contact a poison control center or emergency room at once. NOTE: This medicine is only for you. Do not share this medicine with others. What if I miss a dose? If you miss a dose, take it as soon as you can. If it is almost time for your next dose, take only that dose. Do not take double or extra doses. What may interact with this medicine? This medicine may interact with the following medications:  simvastatin  pravastatin This list may not describe all possible interactions. Give your health care provider a list of all the medicines, herbs, non-prescription drugs, or dietary supplements you use. Also tell them if you  smoke, drink alcohol, or use illegal drugs. Some items may interact with your medicine. What should I watch for while using this medicine? Visit your health care professional for regular checks on your progress. Tell your health care professional if your symptoms do not start to get better or if they get worse. You may need blood work done while you are taking this medicine. This drug is only part of a total heart-health program. Your  doctor or a dietician can suggest a low-cholesterol and low-fat diet to help. Avoid alcohol and smoking, and keep a proper exercise schedule. What side effects may I notice from receiving this medicine? Side effects that you should report to your doctor or health care professional as soon as possible:  allergic reactions like skin rash, itching or hives, swelling of the face, lips, or tongue  signs of gout such as swollen, red, warm, or tender joints, especially in the toes  signs of tendon problems such as tendon pain or swelling or if you are unable to move a joint Side effects that usually do not require medical attention (report these to your doctor or health care professional if they continue or are bothersome):  back pain  cold or flu-like symptoms  muscle spasms  stomach pain This list may not describe all possible side effects. Call your doctor for medical advice about side effects. You may report side effects to FDA at 1-800-FDA-1088. Where should I keep my medicine? Keep out of the reach of children. Store at room temperature between 20 and 25 degrees C (68 and 77 degrees F). Keep this medicine in the original container. Do not throw out the packet in the container. It keeps the medicine dry. Throw away any unused medication after the expiration date. NOTE: This sheet is a summary. It may not cover all possible information. If you have questions about this medicine, talk to your doctor, pharmacist, or health care provider.  2021 Elsevier/Gold Standard (2018-04-10 16:19:16)

## 2020-05-10 NOTE — Telephone Encounter (Signed)
Appeal request completed and faxed. [id number]

## 2020-05-14 ENCOUNTER — Telehealth: Payer: Self-pay

## 2020-05-14 NOTE — Telephone Encounter (Signed)
Per [facility] appeal approved. Auth#[id number] valid from 05/03/2020-02/12/2021. Patient notified

## 2020-05-27 ENCOUNTER — Ambulatory Visit (INDEPENDENT_AMBULATORY_CARE_PROVIDER_SITE_OTHER): Payer: Medicare Other | Admitting: Cardiology

## 2020-05-27 ENCOUNTER — Encounter: Payer: Self-pay | Admitting: Cardiology

## 2020-05-27 ENCOUNTER — Other Ambulatory Visit: Payer: Self-pay

## 2020-05-27 VITALS — BP 124/72 | HR 75 | Ht 71.0 in | Wt 165.0 lb

## 2020-05-27 DIAGNOSIS — I739 Peripheral vascular disease, unspecified: Secondary | ICD-10-CM

## 2020-05-27 DIAGNOSIS — I428 Other cardiomyopathies: Secondary | ICD-10-CM

## 2020-05-27 DIAGNOSIS — I2510 Atherosclerotic heart disease of native coronary artery without angina pectoris: Secondary | ICD-10-CM

## 2020-05-27 NOTE — Patient Instructions (Signed)
Medication Instructions:  No medication changes. *If you need a refill on your cardiac medications before your next appointment, please call your pharmacy*   Lab Work: Your physician recommends that you have labs done in the office today. Your test included  complete metabolic panel, troponin I, pro bnp.  If you have labs (blood work) drawn today and your tests are completely normal, you will receive your results only by: Marland Kitchen MyChart Message (if you have MyChart) OR . A paper copy in the mail If you have any lab test that is abnormal or we need to change your treatment, we will call you to review the results.   Testing/Procedures: Your physician has requested that you have an echocardiogram. Echocardiography is a painless test that uses sound waves to create images of your heart. It provides your doctor with information about the size and shape of your heart and how well your heart's chambers and valves are working. This procedure takes approximately one hour. There are no restrictions for this procedure.  We will get your echocardiogram scheduled at [facility].   Follow-Up: At [facility], you and your health needs are our priority.  As part of our continuing mission to provide you with exceptional heart care, we have created designated Provider Care Teams.  These Care Teams include your primary Cardiologist (physician) and Advanced Practice Providers (APPs -  Physician Assistants and Nurse Practitioners) who all work together to provide you with the care you need, when you need it.  We recommend signing up for the patient portal called "MyChart".  Sign up information is provided on this After Visit Summary.  MyChart is used to connect with patients for Virtual Visits (Telemedicine).  Patients are able to view lab/test results, encounter notes, upcoming appointments, etc.  Non-urgent messages can be sent to your provider as well.   To learn more about what you can do with MyChart, go  to [email or web address].    Your next appointment:   1 month(s)  The format for your next appointment:   In Person  Provider:   Jenne Campus, MD   Other Instructions  Echocardiogram An echocardiogram is a test that uses sound waves (ultrasound) to produce images of the heart. Images from an echocardiogram can provide important information about:  Heart size and shape.  The size and thickness and movement of your heart's walls.  Heart muscle function and strength.  Heart valve function or if you have stenosis. Stenosis is when the heart valves are too narrow.  If blood is flowing backward through the heart valves (regurgitation).  A tumor or infectious growth around the heart valves.  Areas of heart muscle that are not working well because of poor blood flow or injury from a heart attack.  Aneurysm detection. An aneurysm is a weak or damaged part of an artery wall. The wall bulges out from the normal force of blood pumping through the body. Tell a health care provider about:  Any allergies you have.  All medicines you are taking, including vitamins, herbs, eye drops, creams, and over-the-counter medicines.  Any blood disorders you have.  Any surgeries you have had.  Any medical conditions you have.  Whether you are pregnant or may be pregnant. What are the risks? Generally, this is a safe test. However, problems may occur, including an allergic reaction to dye (contrast) that may be used during the test. What happens before the test? No specific preparation is needed. You may eat and drink normally. What happens  during the test?  You will take off your clothes from the waist up and put on a hospital gown.  Electrodes or electrocardiogram (ECG)patches may be placed on your chest. The electrodes or patches are then connected to a device that monitors your heart rate and rhythm.  You will lie down on a table for an ultrasound exam. A gel will be applied to  your chest to help sound waves pass through your skin.  A handheld device, called a transducer, will be pressed against your chest and moved over your heart. The transducer produces sound waves that travel to your heart and bounce back (or "echo" back) to the transducer. These sound waves will be captured in real-time and changed into images of your heart that can be viewed on a video monitor. The images will be recorded on a computer and reviewed by your health care provider.  You may be asked to change positions or hold your breath for a short time. This makes it easier to get different views or better views of your heart.  In some cases, you may receive contrast through an IV in one of your veins. This can improve the quality of the pictures from your heart. The procedure may vary among health care providers and hospitals.   What can I expect after the test? You may return to your normal, everyday life, including diet, activities, and medicines, unless your health care provider tells you not to do that. Follow these instructions at home:  It is up to you to get the results of your test. Ask your health care provider, or the department that is doing the test, when your results will be ready.  Keep all follow-up visits. This is important. Summary  An echocardiogram is a test that uses sound waves (ultrasound) to produce images of the heart.  Images from an echocardiogram can provide important information about the size and shape of your heart, heart muscle function, heart valve function, and other possible heart problems.  You do not need to do anything to prepare before this test. You may eat and drink normally.  After the echocardiogram is completed, you may return to your normal, everyday life, unless your health care provider tells you not to do that. This information is not intended to replace advice given to you by your health care provider. Make sure you discuss any questions you have  with your health care provider. Document Revised: 09/23/2019 Document Reviewed: 09/23/2019 Elsevier Patient Education  2021 [employer].

## 2020-05-27 NOTE — Progress Notes (Signed)
Cardiology Office Note:    Date:  05/27/2020   ID:  Ian Mcgee, DOB 1945-11-09, MRN [id number]  PCP:  Mateo Flow, MD  Cardiologist:  Jenne Campus, MD    Referring MD: Mateo Flow, MD   Chief Complaint  Patient presents with  . Chest Pain  . Shortness of Breath    History of Present Illness:    Ian Mcgee is a 75 y.o. male with past medical history significant for cardiomyopathy latest estimation of ejection fraction showing 45%, coronary artery disease cardiac catheterization years ago showing 50% left anterior descending artery 50% right coronary artery stenosis, also peripheral vascular disease in form of carotic arterial stenosis up to 60%, intolerant to multiple statin, Zetia, Nexcletol. Because earlier today and requested to be seen.  For last few weeks he noticed increased shortness of breath.  He still very active he plays a lot he plays tennis and doing quite well however lately he noticed shortness of breath also he likes to walk on trails and lately he noted shortness of breath to the point that sometimes he have to stop there is no chest pain tightness squeezing pressure burning Sensation with this sensation just shortness of breath.  There is no swelling of lower extremities there is no proximal nocturnal dyspnea except for one episode 2 days ago 2 nights ago actually when he woke up in the middle of the night complaining of having shortness of breath some tightness.  He had to sit up started breathing and got better after 20 to 30 minutes.  That day and during the day he played try to play tennis but was very weak tired and exhausted.  At the moment of my interview he seems to be comfortable.  Past Medical History:  Diagnosis Date  . Attention deficit disorder (ADD) 05/05/2019  . Cardiac pacemaker 05/10/2017  . Cardiomyopathy ([place]) 05/08/2017  . Cardiomyopathy ([place]) 05/08/2017  . Complete heart block ([place]) 06/10/2015  . Coronary artery disease 05/15/2017    40% left main, 50% LAD, 50% RCA recent cardiac catheterization from March 2019  . Dyslipidemia 05/08/2017  . Hyperlipidemia   . Nonischemic cardiomyopathy ([place]) 06/25/2017  . Pacemaker reprogramming/check 06/10/2015  . Peripheral vascular disease ([place]) 05/08/2017   Noncritical bilateral carotid arterial disease  . Pneumonia   . Status post biventricular pacemaker 07/16/2017  . Syncope and collapse     Past Surgical History:  Procedure Laterality Date  . Basel Cell Cancer    . BIV UPGRADE N/A 06/25/2017   Procedure: BIV UPGRADE;  Surgeon: Constance Haw, MD;  Location: [facility];  Service: Cardiovascular;  Laterality: N/A;  . CATARACT EXTRACTION    . INSERT / REPLACE / REMOVE PACEMAKER    . KNEE SURGERY Right    ACL Reconstruction  . LEFT HEART CATH AND CORONARY ANGIOGRAPHY N/A 05/10/2017   Procedure: LEFT HEART CATH AND CORONARY ANGIOGRAPHY;  Surgeon: [name], MD;  Location: [facility];  Service: Cardiovascular;  Laterality: N/A;  . VASECTOMY      Current Medications: Current Meds  Medication Sig  . amphetamine-dextroamphetamine (ADDERALL XR) 20 MG 24 hr capsule Take 20 mg by mouth every morning.  . B-12, Methylcobalamin, 1000 MCG SUBL Place 1 tablet under the tongue daily.  . Multiple Vitamin (MULTIVITAMIN) capsule Take 1 capsule by mouth daily. Unknown strength  . psyllium (METAMUCIL) 58.6 % packet Take 1 packet by mouth daily.     Allergies:   Ciprofloxacin, Crestor [  rosuvastatin calcium], and Nexletol [bempedoic acid]   Social History   Socioeconomic History  . Marital status: Married    Spouse name: Not on file  . Number of children: Not on file  . Years of education: Not on file  . Highest education level: Not on file  Occupational History  . Not on file  Tobacco Use  . Smoking status: Former Smoker    Types: Cigarettes  . Smokeless tobacco: Never Used  Vaping Use  . Vaping Use: Never used  Substance and Sexual Activity  . Alcohol  use: Yes    Comment: Wine with dinner  . Drug use: Never  . Sexual activity: Not on file  Other Topics Concern  . Not on file  Social History Narrative  . Not on file   Social Determinants of Health   Financial Resource Strain: Not on file  Food Insecurity: Not on file  Transportation Needs: Not on file  Physical Activity: Not on file  Stress: Not on file  Social Connections: Not on file     Family History: The patient's family history includes CAD in his brother, father, and mother; Healthy in his brother and sister; Hypertension in his mother; Leukemia in his father; Osteoporosis in his mother; Rheumatic fever in his brother. ROS:   Please see the history of present illness.    All 14 point review of systems negative except as described per history of present illness  EKGs/Labs/Other Studies Reviewed:      Recent Labs: 03/18/2020: BUN 17; Creatinine, Ser 0.82; Potassium 4.6; Sodium 141  Recent Lipid Panel No results found for: CHOL, TRIG, HDL, CHOLHDL, VLDL, LDLCALC, LDLDIRECT  Physical Exam:    VS:  BP 124/72 (BP Location: Left Arm, Patient Position: Sitting)   Pulse 75   Ht 5\' 11"  (1.803 m)   Wt 165 lb (74.8 kg)   SpO2 95%   BMI 23.01 kg/m     Wt Readings from Last 3 Encounters:  05/27/20 165 lb (74.8 kg)  05/03/20 160 lb (72.6 kg)  03/18/20 160 lb (72.6 kg)     GEN:  Well nourished, well developed in no acute distress HEENT: Normal NECK: No JVD; No carotid bruits LYMPHATICS: No lymphadenopathy CARDIAC: RRR, no murmurs, no rubs, no gallops RESPIRATORY:  Clear to auscultation without rales, wheezing or rhonchi  ABDOMEN: Soft, non-tender, non-distended MUSCULOSKELETAL:  No edema; No deformity  SKIN: Warm and dry LOWER EXTREMITIES: no swelling NEUROLOGIC:  Alert and oriented x 3 PSYCHIATRIC:  Normal affect   ASSESSMENT:    1. Nonischemic cardiomyopathy ([place])   2. Coronary artery disease involving native coronary artery of native heart without angina  pectoris   3. Peripheral vascular disease ([place])    PLAN:    In order of problems listed above:  1. Nonischemic cardiomyopathy he represents some symptoms that are worrisome.  I will do proBNP, troponin I today complete metabolic panel, he be scheduled to have repeated echocardiogram.  I will also ask EP team to interrogate his device make sure there is still reasonable percentage of pacing.  His EKG today showed sinus rhythm with paced ventricle. 2. Coronary disease with history of 50% RCA as well as 50% LAD stenosis.  Will wait for results of the test decide which way to pursue investigation he may even require repeated cardiac catheterization or maybe stress testing. 3. Peripheral vascular disease present.  I was trying to convince him again to start taking aspirin every single day he concern about easy bruising  however I told him until we finish this investigation to take it. 4. Dyslipidemia needs a fiasco.  We tried different medication finally he was able to tolerate Nexletol but eventually also developed some side effect and stopped it.   Medication Adjustments/Labs and Tests Ordered: Current medicines are reviewed at length with the patient today.  Concerns regarding medicines are outlined above.  No orders of the defined types were placed in this encounter.  Medication changes: No orders of the defined types were placed in this encounter.   Signed, Park Liter, MD, [facility] 05/27/2020 3:10 PM    [place]

## 2020-05-27 NOTE — Addendum Note (Signed)
Addended by: Truddie Hidden on: 05/27/2020 03:29 PM   Modules accepted: Orders

## 2020-05-28 ENCOUNTER — Encounter (HOSPITAL_COMMUNITY): Payer: Self-pay | Admitting: Cardiology

## 2020-05-28 ENCOUNTER — Ambulatory Visit (HOSPITAL_COMMUNITY)
Admission: AD | Disposition: A | Discharge status: Home / Self Care | Payer: Self-pay | Source: Ambulatory Visit | Attending: Cardiology

## 2020-05-28 ENCOUNTER — Inpatient Hospital Stay (HOSPITAL_COMMUNITY): Payer: Medicare Other

## 2020-05-28 ENCOUNTER — Observation Stay (HOSPITAL_COMMUNITY)
Admission: AD | Admit: 2020-05-28 | Discharge: 2020-05-29 | Disposition: A | Discharge status: Home / Self Care | Payer: Medicare Other | Source: Ambulatory Visit | Attending: Cardiology | Admitting: Cardiology

## 2020-05-28 ENCOUNTER — Emergency Department (HOSPITAL_COMMUNITY): Admission: EM | Admit: 2020-05-28 | Payer: Medicare Other | Source: Home / Self Care

## 2020-05-28 ENCOUNTER — Ambulatory Visit: Payer: Medicare Other | Admitting: Cardiology

## 2020-05-28 DIAGNOSIS — Z950 Presence of cardiac pacemaker: Secondary | ICD-10-CM | POA: Insufficient documentation

## 2020-05-28 DIAGNOSIS — J90 Pleural effusion, not elsewhere classified: Secondary | ICD-10-CM | POA: Diagnosis not present

## 2020-05-28 DIAGNOSIS — R0602 Shortness of breath: Secondary | ICD-10-CM | POA: Diagnosis not present

## 2020-05-28 DIAGNOSIS — Z87891 Personal history of nicotine dependence: Secondary | ICD-10-CM | POA: Diagnosis not present

## 2020-05-28 DIAGNOSIS — I25119 Atherosclerotic heart disease of native coronary artery with unspecified angina pectoris: Secondary | ICD-10-CM | POA: Diagnosis not present

## 2020-05-28 DIAGNOSIS — I2510 Atherosclerotic heart disease of native coronary artery without angina pectoris: Secondary | ICD-10-CM | POA: Diagnosis not present

## 2020-05-28 DIAGNOSIS — I5023 Acute on chronic systolic (congestive) heart failure: Secondary | ICD-10-CM

## 2020-05-28 DIAGNOSIS — I5022 Chronic systolic (congestive) heart failure: Secondary | ICD-10-CM

## 2020-05-28 DIAGNOSIS — R0600 Dyspnea, unspecified: Secondary | ICD-10-CM | POA: Diagnosis not present

## 2020-05-28 DIAGNOSIS — I2089 Other forms of angina pectoris: Secondary | ICD-10-CM | POA: Diagnosis present

## 2020-05-28 DIAGNOSIS — Z20822 Contact with and (suspected) exposure to covid-19: Secondary | ICD-10-CM | POA: Diagnosis not present

## 2020-05-28 DIAGNOSIS — R079 Chest pain, unspecified: Secondary | ICD-10-CM | POA: Diagnosis not present

## 2020-05-28 DIAGNOSIS — I208 Other forms of angina pectoris: Secondary | ICD-10-CM | POA: Diagnosis present

## 2020-05-28 DIAGNOSIS — I517 Cardiomegaly: Secondary | ICD-10-CM | POA: Diagnosis not present

## 2020-05-28 HISTORY — PX: CORONARY PRESSURE/FFR STUDY: CATH118243

## 2020-05-28 HISTORY — PX: RIGHT/LEFT HEART CATH AND CORONARY ANGIOGRAPHY: CATH118266

## 2020-05-28 LAB — COMPREHENSIVE METABOLIC PANEL
ALT: 26 IU/L (ref 0–44)
AST: 29 IU/L (ref 0–40)
Albumin/Globulin Ratio: 2.6 — ABNORMAL HIGH (ref 1.2–2.2)
Albumin: 4.6 g/dL (ref 3.7–4.7)
Alkaline Phosphatase: 52 IU/L (ref 44–121)
BUN/Creatinine Ratio: 18 (ref 10–24)
BUN: 14 mg/dL (ref 8–27)
Bilirubin Total: 0.3 mg/dL (ref 0.0–1.2)
CO2: 21 mmol/L (ref 20–29)
Calcium: 9 mg/dL (ref 8.6–10.2)
Chloride: 104 mmol/L (ref 96–106)
Creatinine, Ser: 0.78 mg/dL (ref 0.76–1.27)
Globulin, Total: 1.8 g/dL (ref 1.5–4.5)
Glucose: 75 mg/dL (ref 65–99)
Potassium: 4.4 mmol/L (ref 3.5–5.2)
Sodium: 142 mmol/L (ref 134–144)
Total Protein: 6.4 g/dL (ref 6.0–8.5)
eGFR: 94 mL/min/{1.73_m2} (ref >59)

## 2020-05-28 LAB — POCT I-STAT EG7
Acid-Base Excess: 0 mmol/L (ref 0.0–2.0)
Bicarbonate: 24.5 mmol/L (ref 20.0–28.0)
Calcium, Ion: 1.15 mmol/L (ref 1.15–1.40)
HCT: 39 % (ref 39.0–52.0)
Hemoglobin: 13.3 g/dL (ref 13.0–17.0)
O2 Saturation: 75 %
Potassium: 3.4 mmol/L — ABNORMAL LOW (ref 3.5–5.1)
Sodium: 141 mmol/L (ref 135–145)
TCO2: 26 mmol/L (ref 22–32)
pCO2, Ven: 37.6 mmHg — ABNORMAL LOW (ref 44.0–60.0)
pH, Ven: 7.422 (ref 7.250–7.430)
pO2, Ven: 39 mmHg (ref 32.0–45.0)

## 2020-05-28 LAB — CBC
HCT: 40.6 % (ref 39.0–52.0)
Hemoglobin: 13.9 g/dL (ref 13.0–17.0)
MCH: 33.2 pg (ref 26.0–34.0)
MCHC: 34.2 g/dL (ref 30.0–36.0)
MCV: 96.9 fL (ref 80.0–100.0)
Platelets: 257 10*3/uL (ref 150–400)
RBC: 4.19 MIL/uL — ABNORMAL LOW (ref 4.22–5.81)
RDW: 13.2 % (ref 11.5–15.5)
WBC: 7.9 10*3/uL (ref 4.0–10.5)
nRBC: 0 % (ref 0.0–0.2)

## 2020-05-28 LAB — PRO B NATRIURETIC PEPTIDE: NT-Pro BNP: 1028 pg/mL — ABNORMAL HIGH (ref 0–376)

## 2020-05-28 LAB — POCT ACTIVATED CLOTTING TIME: Activated Clotting Time: 255 seconds

## 2020-05-28 LAB — CREATININE, SERUM
Creatinine, Ser: 0.92 mg/dL (ref 0.61–1.24)
GFR, Estimated: >60 mL/min (ref >60)

## 2020-05-28 LAB — TROPONIN T: Troponin T (Highly Sensitive): 36 ng/L (ref 0–22)

## 2020-05-28 LAB — SARS CORONAVIRUS 2 BY RT PCR (HOSPITAL ORDER, PERFORMED IN ~~LOC~~ HOSPITAL LAB): SARS Coronavirus 2: NEGATIVE

## 2020-05-28 SURGERY — RIGHT/LEFT HEART CATH AND CORONARY ANGIOGRAPHY
Anesthesia: LOCAL

## 2020-05-28 MED ORDER — LIDOCAINE HCL (PF) 1 % IJ SOLN
INTRAMUSCULAR | Status: DC | PRN
  Administered 2020-05-28: 5 mL

## 2020-05-28 MED ORDER — SODIUM CHLORIDE 0.9% FLUSH: 3.0000 mL | Freq: Two times a day (BID) | INTRAVENOUS | Status: DC

## 2020-05-28 MED ORDER — SODIUM CHLORIDE 0.9% FLUSH
3.0000 mL | Freq: Two times a day (BID) | INTRAVENOUS | Status: DC
  Administered 2020-05-29: 3 mL via INTRAVENOUS

## 2020-05-28 MED ORDER — ONDANSETRON HCL 4 MG/2ML IJ SOLN: 4.0000 mg | Freq: Four times a day (QID) | INTRAMUSCULAR | Status: DC | PRN

## 2020-05-28 MED ORDER — IOHEXOL 350 MG/ML SOLN
INTRAVENOUS | Status: DC | PRN
  Administered 2020-05-28: 90 mL via INTRA_ARTERIAL

## 2020-05-28 MED ORDER — NITROGLYCERIN 0.4 MG SL SUBL: 0.4000 mg | SUBLINGUAL_TABLET | SUBLINGUAL | Status: DC | PRN

## 2020-05-28 MED ORDER — PSYLLIUM 95 % PO PACK
1.0000 | PACK | Freq: Every day | ORAL | Status: DC
  Administered 2020-05-29: 1 via ORAL
  Filled 2020-05-28 (×2): qty 1

## 2020-05-28 MED ORDER — ADULT MULTIVITAMIN W/MINERALS CH
1.0000 | ORAL_TABLET | Freq: Every day | ORAL | Status: DC
  Administered 2020-05-29: 1 via ORAL
  Filled 2020-05-28: qty 1

## 2020-05-28 MED ORDER — MULTIVITAMINS PO CAPS: 1.0000 | ORAL_CAPSULE | Freq: Every day | ORAL | Status: DC

## 2020-05-28 MED ORDER — AMPHETAMINE-DEXTROAMPHET ER 5 MG PO CP24: 20.0000 mg | ORAL_CAPSULE | Freq: Every morning | ORAL | Status: DC

## 2020-05-28 MED ORDER — ACETAMINOPHEN 325 MG PO TABS
650.0000 mg | ORAL_TABLET | ORAL | Status: DC | PRN
Start: 2020-05-28 — End: 2020-05-29

## 2020-05-28 MED ORDER — HEPARIN SODIUM (PORCINE) 1000 UNIT/ML IJ SOLN
INTRAMUSCULAR | Status: DC | PRN
  Administered 2020-05-28: 2000 [IU] via INTRAVENOUS
  Administered 2020-05-28: 4000 [IU] via INTRAVENOUS

## 2020-05-28 MED ORDER — HEPARIN (PORCINE) IN NACL 2000-0.9 UNIT/L-% IV SOLN
INTRAVENOUS | Status: AC
  Filled 2020-05-28: qty 1000

## 2020-05-28 MED ORDER — SODIUM CHLORIDE 0.9 % IV SOLN: 250.0000 mL | INTRAVENOUS | Status: DC | PRN

## 2020-05-28 MED ORDER — FUROSEMIDE 10 MG/ML IJ SOLN
20.0000 mg | Freq: Two times a day (BID) | INTRAMUSCULAR | Status: DC
  Administered 2020-05-29: 20 mg via INTRAVENOUS
  Filled 2020-05-28 (×2): qty 2

## 2020-05-28 MED ORDER — SODIUM CHLORIDE 0.9 % WEIGHT BASED INFUSION: 1.0000 mL/kg/h | INTRAVENOUS | Status: DC

## 2020-05-28 MED ORDER — VERAPAMIL HCL 2.5 MG/ML IV SOLN
INTRAVENOUS | Status: DC | PRN
  Administered 2020-05-28: 10 mL via INTRA_ARTERIAL

## 2020-05-28 MED ORDER — B-12 (METHYLCOBALAMIN) 1000 MCG SL SUBL: 1.0000 | SUBLINGUAL_TABLET | Freq: Every day | SUBLINGUAL | Status: DC

## 2020-05-28 MED ORDER — POTASSIUM CHLORIDE CRYS ER 20 MEQ PO TBCR
20.0000 meq | EXTENDED_RELEASE_TABLET | Freq: Two times a day (BID) | ORAL | Status: DC
  Administered 2020-05-28 – 2020-05-29 (×2): 20 meq via ORAL
  Filled 2020-05-28 (×2): qty 1

## 2020-05-28 MED ORDER — HEPARIN (PORCINE) IN NACL 1000-0.9 UT/500ML-% IV SOLN
INTRAVENOUS | Status: DC | PRN
  Administered 2020-05-28 (×2): 500 mL

## 2020-05-28 MED ORDER — ASPIRIN 81 MG PO CHEW: 81.0000 mg | CHEWABLE_TABLET | ORAL | Status: AC

## 2020-05-28 MED ORDER — VERAPAMIL HCL 2.5 MG/ML IV SOLN
INTRAVENOUS | Status: AC
  Filled 2020-05-28: qty 2

## 2020-05-28 MED ORDER — FENTANYL CITRATE (PF) 100 MCG/2ML IJ SOLN
INTRAMUSCULAR | Status: AC
  Filled 2020-05-28: qty 2

## 2020-05-28 MED ORDER — HYDRALAZINE HCL 20 MG/ML IJ SOLN: 10.0000 mg | INTRAMUSCULAR | Status: AC | PRN

## 2020-05-28 MED ORDER — SODIUM CHLORIDE 0.9% FLUSH
3.0000 mL | Freq: Two times a day (BID) | INTRAVENOUS | Status: DC
  Administered 2020-05-28: 3 mL via INTRAVENOUS

## 2020-05-28 MED ORDER — HEPARIN SODIUM (PORCINE) 1000 UNIT/ML IJ SOLN
INTRAMUSCULAR | Status: AC
  Filled 2020-05-28: qty 1

## 2020-05-28 MED ORDER — MIDAZOLAM HCL 2 MG/2ML IJ SOLN
INTRAMUSCULAR | Status: AC
  Filled 2020-05-28: qty 2

## 2020-05-28 MED ORDER — ASPIRIN 300 MG RE SUPP
300.0000 mg | RECTAL | Status: AC
  Filled 2020-05-28: qty 1

## 2020-05-28 MED ORDER — FUROSEMIDE 10 MG/ML IJ SOLN
40.0000 mg | Freq: Once | INTRAMUSCULAR | Status: AC
  Administered 2020-05-28: 40 mg via INTRAVENOUS
  Filled 2020-05-28: qty 4

## 2020-05-28 MED ORDER — SODIUM CHLORIDE 0.9% FLUSH: 3.0000 mL | INTRAVENOUS | Status: DC | PRN

## 2020-05-28 MED ORDER — VITAMIN B-12 1000 MCG PO TABS
1000.0000 ug | ORAL_TABLET | Freq: Every day | ORAL | Status: DC
  Administered 2020-05-29: 1000 ug via ORAL
  Filled 2020-05-28: qty 1

## 2020-05-28 MED ORDER — LIDOCAINE HCL (PF) 1 % IJ SOLN
INTRAMUSCULAR | Status: AC
  Filled 2020-05-28: qty 30

## 2020-05-28 MED ORDER — ASPIRIN 81 MG PO CHEW: 324.0000 mg | CHEWABLE_TABLET | ORAL | Status: AC

## 2020-05-28 MED ORDER — SODIUM CHLORIDE 0.9% FLUSH
3.0000 mL | INTRAVENOUS | Status: DC | PRN
Start: 2020-05-28 — End: 2020-05-29

## 2020-05-28 MED ORDER — ASPIRIN EC 81 MG PO TBEC
81.0000 mg | DELAYED_RELEASE_TABLET | Freq: Every day | ORAL | Status: DC
  Administered 2020-05-29: 81 mg via ORAL
  Filled 2020-05-28: qty 1

## 2020-05-28 MED ORDER — HEPARIN SODIUM (PORCINE) 5000 UNIT/ML IJ SOLN
5000.0000 [IU] | Freq: Three times a day (TID) | INTRAMUSCULAR | Status: DC
  Administered 2020-05-28: 5000 [IU] via SUBCUTANEOUS
  Filled 2020-05-28 (×2): qty 1

## 2020-05-28 MED ORDER — LABETALOL HCL 5 MG/ML IV SOLN: 10.0000 mg | INTRAVENOUS | Status: AC | PRN

## 2020-05-28 MED ORDER — SODIUM CHLORIDE 0.9 % WEIGHT BASED INFUSION
3.0000 mL/kg/h | INTRAVENOUS | Status: AC
  Administered 2020-05-28: 3 mL/kg/h via INTRAVENOUS

## 2020-05-28 SURGICAL SUPPLY — 16 items
CATH 5FR JL3.5 JR4 ANG PIG MP (CATHETERS) ×2 IMPLANT
CATH INFINITI 5FR MPB2 (CATHETERS) ×2 IMPLANT
CATH LAUNCHER 5F EBU3.5 (CATHETERS) ×2 IMPLANT
CATH SWAN GANZ 7F STRAIGHT (CATHETERS) ×2 IMPLANT
DEVICE RAD TR BAND REGULAR (VASCULAR PRODUCTS) ×2 IMPLANT
GLIDESHEATH SLEND SS 6F .021 (SHEATH) ×2 IMPLANT
GLIDESHEATH SLENDER 7FR .021G (SHEATH) ×2 IMPLANT
GUIDEWIRE INQWIRE 1.5J.035X260 (WIRE) ×1 IMPLANT
GUIDEWIRE PRESSURE X 175 (WIRE) ×2 IMPLANT
INQWIRE 1.5J .035X260CM (WIRE) ×2
KIT ESSENTIALS PG (KITS) ×2 IMPLANT
KIT HEART LEFT (KITS) ×2 IMPLANT
PACK CARDIAC CATHETERIZATION (CUSTOM PROCEDURE TRAY) ×2 IMPLANT
SHEATH PROBE COVER 6X72 (BAG) ×2 IMPLANT
TRANSDUCER W/STOPCOCK (MISCELLANEOUS) ×2 IMPLANT
TUBING CIL FLEX 10 FLL-RA (TUBING) ×2 IMPLANT

## 2020-05-28 NOTE — H&P (Addendum)
Cardiology Admission History and Physical:   Patient ID: Ian Mcgee MRN: [id number]; DOB: May 27, 1945   Admission date: 05/28/2020  PCP:  Mateo Flow, MD   [place]  Cardiologist:  Jenne Campus, MD  Advanced Practice Provider:  No care team member to display Electrophysiologist:  [name], MD        Chief Complaint:  Chest pain  Patient Profile:   Ian Mcgee is a 75 y.o. male with hx of non ischemic cardiomyopathy, last EF 45% with BiV . Hx of CHB, and CAD with cath years ago with 50% LAD, 50% RCA, and hx PVD with carotid disease of 60%.  He is intolerant to multiple statins and zetia, nexcletol. Has seen Dr. Debara Pickett for lipid clinic,  Recently increased SOB,  Despite being active -plays tennis.  With walking trails has to stop to catch his breath. No chest pain or squeezing.  2-3 days ago did wake with SOB and tightness.  Last time he tried to play tennis but very weak tired and exhausted.      Last echo 03/15/20 with EF 45-50% mild LVH mild MVR,   Was seen by Dr. Agustin Cree and is being admitted with plans for Lt cardiac cath.   Presents direct admit to 6E 30.   History of Present Illness:   Ian Mcgee with above hx now presents for cardiac cath today.  Labs yesterday  Troponin 36,  BUN 14, Cr 0.78, Na 142,  Pro BNP 1028,   No chest pain overnight.  Pt has elevated BP coming in for cath, wife recently with significant back surgery and at home recovering.  No chest pain now.  No SOB currently.  No JVD sitting up in chair. Pt is normally very active and has had to decrease activity significantly.    Past Medical History:  Diagnosis Date  . Attention deficit disorder (ADD) 05/05/2019  . Cardiac pacemaker 05/10/2017  . Cardiomyopathy ([place]) 05/08/2017  . Cardiomyopathy ([place]) 05/08/2017  . Complete heart block ([place]) 06/10/2015  . Coronary artery disease 05/15/2017   40% left main, 50% LAD, 50% RCA recent cardiac catheterization from  March 2019  . Dyslipidemia 05/08/2017  . Hyperlipidemia   . Nonischemic cardiomyopathy ([place]) 06/25/2017  . Pacemaker reprogramming/check 06/10/2015  . Peripheral vascular disease ([place]) 05/08/2017   Noncritical bilateral carotid arterial disease  . Pneumonia   . Status post biventricular pacemaker 07/16/2017  . Syncope and collapse     Past Surgical History:  Procedure Laterality Date  . Basel Cell Cancer    . BIV UPGRADE N/A 06/25/2017   Procedure: BIV UPGRADE;  Surgeon: Constance Haw, MD;  Location: [facility];  Service: Cardiovascular;  Laterality: N/A;  . CATARACT EXTRACTION    . INSERT / REPLACE / REMOVE PACEMAKER    . KNEE SURGERY Right    ACL Reconstruction  . LEFT HEART CATH AND CORONARY ANGIOGRAPHY N/A 05/10/2017   Procedure: LEFT HEART CATH AND CORONARY ANGIOGRAPHY;  Surgeon: [name], MD;  Location: [facility];  Service: Cardiovascular;  Laterality: N/A;  . VASECTOMY       Medications Prior to Admission: Prior to Admission medications   Medication Sig Start Date End Date Taking? Authorizing Provider  amphetamine-dextroamphetamine (ADDERALL XR) 20 MG 24 hr capsule Take 20 mg by mouth every morning. 03/04/19   [provider]  B-12, Methylcobalamin, 1000 MCG SUBL Place 1 tablet under the tongue daily. 12/26/19   [provider]  Multiple Vitamin (MULTIVITAMIN) capsule Take 1 capsule by mouth daily. Unknown strength    [provider]  psyllium (METAMUCIL) 58.6 % packet Take 1 packet by mouth daily.    [provider]  tadalafil (CIALIS) 10 MG tablet Take 10 mg by mouth daily at 6 (six) AM. Enlarge prostate 12/23/19   [provider]     Allergies:    Allergies  Allergen Reactions  . Ciprofloxacin Other (See Comments)    Bleeding (intolerance)  . Crestor [Rosuvastatin Calcium] Other (See Comments)    Muscle twinges  . Nexletol [Bempedoic Acid]     Muscle and joint pain    Social History:   Social  History   Socioeconomic History  . Marital status: Married    Spouse name: Not on file  . Number of children: Not on file  . Years of education: Not on file  . Highest education level: Not on file  Occupational History  . Not on file  Tobacco Use  . Smoking status: Former Smoker    Types: Cigarettes  . Smokeless tobacco: Never Used  Vaping Use  . Vaping Use: Never used  Substance and Sexual Activity  . Alcohol use: Yes    Comment: Wine with dinner  . Drug use: Never  . Sexual activity: Not on file  Other Topics Concern  . Not on file  Social History Narrative  . Not on file   Social Determinants of Health   Financial Resource Strain: Not on file  Food Insecurity: Not on file  Transportation Needs: Not on file  Physical Activity: Not on file  Stress: Not on file  Social Connections: Not on file  Intimate Partner Violence: Not on file    Family History:   The patient's family history includes CAD in his brother, father, and mother; Healthy in his brother and sister; Hypertension in his mother; Leukemia in his father; Osteoporosis in his mother; Rheumatic fever in his brother.    ROS:  Please see the history of present illness.  General:no colds or fevers, no weight changes Skin:no rashes or ulcers HEENT:no blurred vision, no congestion CV:see HPI PUL:see HPI GI:no diarrhea constipation or melena, no indigestion GU:no hematuria, no dysuria MS:no joint pain, no claudication Neuro:no syncope, no lightheadedness Endo:no diabetes, no thyroid disease  All other ROS reviewed and negative.     Physical Exam/Data:   Vitals:   05/28/20 1052  Pulse: 91  Resp: 16  Temp: 98.2 F (36.8 C)  TempSrc: Oral  SpO2: 99%  Weight: 72.3 kg  Height: 5\' 11"  (1.803 m)   No intake or output data in the 24 hours ending 05/28/20 1123 Last 3 Weights 05/28/2020 05/27/2020 05/03/2020  Weight (lbs) 159 lb 8 oz 165 lb 160 lb  Weight (kg) 72.349 kg 74.844 kg 72.576 kg     Body mass  index is 22.25 kg/m.  General:  Well nourished, well developed, in no acute distress HEENT: normal Lymph: no adenopathy Neck: no JVD Endocrine:  No thryomegaly Vascular: No carotid bruits; pedal pulses 2+ bilaterally  Cardiac:  normal S1, S2; RRR; soft 1-2/6 murmur no gallup or rub Lungs:  clear to auscultation bilaterally, no wheezing, rhonchi or rales  Abd: soft, nontender, no hepatomegaly  Ext: no lower ext edema Musculoskeletal:  No deformities, BUE and BLE strength normal and equal Skin: warm and dry  Neuro:  Alert and oriented X 3 MAE, follows commands, no focal abnormalities noted Psych:  Normal affect  EKG:  The ECG that was done 05/27/20 was personally reviewed and demonstrates SR with atrially sensing and V pacing. No acute ST changes  Relevant CV Studies: Echo 03/15/20  IMPRESSIONS    1. Left ventricular ejection fraction, by estimation, is 45 to 50%. The  left ventricle has mildly decreased function. The left ventricle  demonstrates global hypokinesis. There is mild concentric left ventricular  hypertrophy. Indeterminate diastolic  filling due to E-A fusion. The average left ventricular global  longitudinal strain is -10.0 %. The global longitudinal strain is  abnormal.  2. Right ventricular systolic function is normal. The right ventricular  size is normal. There is normal pulmonary artery systolic pressure.  3. Left atrial size was mildly dilated.  4. The mitral valve is normal in structure. Mild mitral valve  regurgitation. No evidence of mitral stenosis.  5. The aortic valve is normal in structure. Aortic valve regurgitation is  not visualized. Mild aortic valve sclerosis is present, with no evidence  of aortic valve stenosis.  6. The inferior vena cava is normal in size with greater than 50%  respiratory variability, suggesting right atrial pressure of 3 mmHg.   FINDINGS  Left Ventricle: Left ventricular ejection fraction, by estimation, is 45  to  50%. The left ventricle has mildly decreased function. The left  ventricle demonstrates global hypokinesis. The average left ventricular  global longitudinal strain is -10.0 %.  The global longitudinal strain is abnormal. The left ventricular internal  cavity size was normal in size. There is mild concentric left ventricular  hypertrophy. Indeterminate diastolic filling due to E-A fusion.   Right Ventricle: The right ventricular size is normal. No increase in  right ventricular wall thickness. Right ventricular systolic function is  normal. There is normal pulmonary artery systolic pressure. The tricuspid  regurgitant velocity is 2.59 m/s, and  with an assumed right atrial pressure of 3 mmHg, the estimated right  ventricular systolic pressure is 25.9 mmHg.   Left Atrium: Left atrial size was mildly dilated.   Right Atrium: Right atrial size was normal in size.   Pericardium: There is no evidence of pericardial effusion.   Mitral Valve: The mitral valve is normal in structure. Mild mitral valve  regurgitation. No evidence of mitral valve stenosis.   Tricuspid Valve: The tricuspid valve is normal in structure. Tricuspid  valve regurgitation is not demonstrated. No evidence of tricuspid  stenosis.   Aortic Valve: The aortic valve is normal in structure. Aortic valve  regurgitation is not visualized. Mild aortic valve sclerosis is present,  with no evidence of aortic valve stenosis.   Cardiac cath 05/10/17    There is moderate to severe left ventricular systolic dysfunction. The left ventricular ejection fraction is 35-45% by visual estimate.  LV end diastolic pressure is normal.  There is moderate (3+) mitral regurgitation.  Diffuse moderate calcific disease in the left main-LAD: Ost LM-LAD lesion is 40% stenosed. Prox LAD-1 lesion is 50% stenosed. Prox LAD to Mid LAD lesion is 50% stenosed.  Prox LAD-2 lesion -mild focal ectatic dilation just distal to the takeoff of the  diagonal branch  Prox RCA lesion is 65% stenosed. Otherwise diffusely diseased/calcified vessel.  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   Diffuse moderate to severe calcified disease in the RCA as well as LAD.  But no focal lesion to explain regional wall motion normality. Suspect that his reduced  ejection fraction is related to pacemaker syndrome.  Consider cardiac resynchronization therapy.  With relatively normal LVEDP, the patient is euvolemic despite having significantly dilated diffusely hypokinetic left ventricle.  Plan: We will return to Dr. Agustin Cree for ongoing care and consideration of EP consultation. Discharge after bed rest and pneumatic band removal.   Laboratory Data:  High Sensitivity Troponin:  No results for input(s): TROPONINIHS in the last 720 hours.    Chemistry Recent Labs  Lab 05/27/20 1535  NA 142  K 4.4  CL 104  CO2 21  GLUCOSE 75  BUN 14  CREATININE 0.78  CALCIUM 9.0    Recent Labs  Lab 05/27/20 1535  PROT 6.4  ALBUMIN 4.6  AST 29  ALT 26  ALKPHOS 52  BILITOT 0.3   HematologyNo results for input(s): WBC, RBC, HGB, HCT, MCV, MCH, MCHC, RDW, PLT in the last 168 hours. BNP Recent Labs  Lab 05/27/20 1535  PROBNP 1,028*    DDimer No results for input(s): DDIMER in the last 168 hours.   Radiology/Studies:  No results found.   Assessment and Plan:   1. Increasing dyspnea and some chest tightness stable and unstable.angina-over last few day with known CAD on last cath 2019,  At that time non obstructive.  Troponin mildy elevated yesterday and pro BNP elevated.  No pain last pm.  For cardiac cath today. Not on BB or ACE - will hold for now until we see results of tests.   2. The patient understands that risks included but are not limited to stroke (1 in 1000), death (1 in 28), kidney failure [usually  temporary] (1 in 500), bleeding (1 in 200), allergic reaction [possibly serious] (1 in 200).  3. NICM with Biv device, originally was PPM for CHB but upgraded with decrease in ER in 2019.  Has been stable and SR  Last evaluated 03/26/20 with normal remote reviewed and battery and lead parameters.   4. Carotid disease on ultrasound with 60% stenosis both sides.  Stable 5. HLD has tried many statins and intolerant, has seen Dr. Debara Pickett in lipid clinic. At one point he was able to tolerate nexletol but he also developed side effects to this.  Check lipids in AM he did have BK today  6. Hx ADD on adderall    Risk Assessment/Risk Scores:     HEAR Score (for undifferentiated chest pain):  HEAR Score: 6       Severity of Illness: The appropriate patient status for this patient is INPATIENT. Inpatient status is judged to be reasonable and necessary in order to provide the required intensity of service to ensure the patient's safety. The patient's presenting symptoms, physical exam findings, and initial radiographic and laboratory data in the context of their chronic comorbidities is felt to place them at high risk for further clinical deterioration. Furthermore, it is not anticipated that the patient will be medically stable for discharge from the hospital within 2 midnights of admission. The following factors support the patient status of inpatient.   " The patient's presenting symptoms include dyspnea increasing with activity and woke from sleep once, + chest tightness once. " The worrisome physical exam findings include none  The initial radiographic and laboratory data are worrisome because of  mildly elevated troponin. . " The chronic co-morbidities include CAD, NICM.   * I certify that at the point of admission it is my clinical judgment that the patient will require inpatient hospital care spanning beyond 2 midnights from the point  of admission due to high intensity of service, high risk for  further deterioration and high frequency of surveillance required.*    For questions or updates, please contact [place] Please consult [email or web address] for contact info under     Signed, Cecilie Kicks, NP  05/28/2020 11:23 AM   Patient seen and examined.  Agree with above documentation.  Ian Mcgee is a 75 year old male with history of nonischemic cardiomyopathy (most recent EF 45-50%) status post BiV pacemaker, complete heart block, nonobstructive CAD who was admitted today with worsening shortness of breath.  Has history of nonobstructive CAD with last cath in March 2019 showing 40% ostial left main to LAD lesion, 50% proximal LAD, 50% proximal to mid LAD, 65% proximal RCA.  He has been unable to tolerate statins, Zetia, or Nexletol.  Most recent echo January 2022 showed EF 45 to 50%.  He reports that he has been having worsening shortness of breath.  Reports has progressively worsened over the last 6 weeks but particularly over the last week.  Denies any chest pain.  He was seen by Dr. Agustin Cree and labs showed proBNP 1028, creatinine 0.79, hemoglobin 13.9, platelets 257, troponin 36.  EKG shows BiV paced rhythm.  Dr. Agustin Cree recommended admitting for cath.  On exam, patient is alert and oriented, regular rate and rhythm, no murmurs, lungs CTAB, no LE edema or JVD.  Plan for RHC/LHC today.  Risks and benefits of cardiac catheterization have been discussed with the patient.  These include bleeding, infection, kidney damage, stroke, heart attack, death.  The patient understands these risks and is willing to proceed.   Donato Heinz, MD

## 2020-05-28 NOTE — Interval H&P Note (Signed)
History and Physical Interval Note:  05/28/2020 4:43 PM  Ian Mcgee  has presented today for surgery, with the diagnosis of SOB.  The various methods of treatment have been discussed with the patient and family. After consideration of risks, benefits and other options for treatment, the patient has consented to  Procedure(s): RIGHT/LEFT HEART CATH AND CORONARY ANGIOGRAPHY (N/A) as a surgical intervention.  The patient's history has been reviewed, patient examined, no change in status, stable for surgery.  I have reviewed the patient's chart and labs.  Questions were answered to the patient's satisfaction.     [name]

## 2020-05-28 NOTE — Interval H&P Note (Signed)
History and Physical Interval Note:  05/28/2020 4:13 PM  Ian Mcgee  has presented today for surgery, with the diagnosis of SOB.  The various methods of treatment have been discussed with the patient and family. After consideration of risks, benefits and other options for treatment, the patient has consented to  Procedure(s): RIGHT/LEFT HEART CATH AND CORONARY ANGIOGRAPHY (N/A) as a surgical intervention.  The patient's history has been reviewed, patient examined, no change in status, stable for surgery.  I have reviewed the patient's chart and labs.  Questions were answered to the patient's satisfaction.     [name]

## 2020-05-28 NOTE — Progress Notes (Signed)
RN went to administer 81 mg pre-cath aspirin. Pt informed RN he already took it this morning in self-preporation of today's possible catheterization.

## 2020-05-29 ENCOUNTER — Observation Stay (HOSPITAL_BASED_OUTPATIENT_CLINIC_OR_DEPARTMENT_OTHER): Payer: Medicare Other

## 2020-05-29 DIAGNOSIS — I340 Nonrheumatic mitral (valve) insufficiency: Secondary | ICD-10-CM | POA: Diagnosis not present

## 2020-05-29 DIAGNOSIS — R079 Chest pain, unspecified: Secondary | ICD-10-CM

## 2020-05-29 DIAGNOSIS — I25119 Atherosclerotic heart disease of native coronary artery with unspecified angina pectoris: Secondary | ICD-10-CM | POA: Diagnosis not present

## 2020-05-29 DIAGNOSIS — Z950 Presence of cardiac pacemaker: Secondary | ICD-10-CM | POA: Diagnosis not present

## 2020-05-29 DIAGNOSIS — I5023 Acute on chronic systolic (congestive) heart failure: Secondary | ICD-10-CM | POA: Diagnosis not present

## 2020-05-29 DIAGNOSIS — Z87891 Personal history of nicotine dependence: Secondary | ICD-10-CM | POA: Diagnosis not present

## 2020-05-29 DIAGNOSIS — Z20822 Contact with and (suspected) exposure to covid-19: Secondary | ICD-10-CM | POA: Diagnosis not present

## 2020-05-29 LAB — BASIC METABOLIC PANEL
Anion gap: 12 (ref 5–15)
BUN: 13 mg/dL (ref 8–23)
CO2: 25 mmol/L (ref 22–32)
Calcium: 9 mg/dL (ref 8.9–10.3)
Chloride: 100 mmol/L (ref 98–111)
Creatinine, Ser: 1.1 mg/dL (ref 0.61–1.24)
GFR, Estimated: >60 mL/min (ref >60)
Glucose, Bld: 164 mg/dL — ABNORMAL HIGH (ref 70–99)
Potassium: 3.5 mmol/L (ref 3.5–5.1)
Sodium: 137 mmol/L (ref 135–145)

## 2020-05-29 LAB — ECHOCARDIOGRAM COMPLETE
AR max vel: 1.76 cm2
AV Area VTI: 2.08 cm2
AV Area mean vel: 1.49 cm2
AV Mean grad: 8 mmHg
AV Peak grad: 14.1 mmHg
Ao pk vel: 1.88 m/s
Area-P 1/2: 4.21 cm2
Calc EF: 42.9 %
Height: 71 in
S' Lateral: 4.9 cm
Single Plane A2C EF: 40.2 %
Single Plane A4C EF: 40.6 %
Weight: 2422.4 oz

## 2020-05-29 LAB — LIPID PANEL
Cholesterol: 196 mg/dL (ref 0–200)
HDL: 71 mg/dL (ref >40)
LDL Cholesterol: 107 mg/dL — ABNORMAL HIGH (ref 0–99)
Total CHOL/HDL Ratio: 2.8 RATIO
Triglycerides: 92 mg/dL (ref <150)
VLDL: 18 mg/dL (ref 0–40)

## 2020-05-29 MED ORDER — PERFLUTREN LIPID MICROSPHERE
1.0000 mL | INTRAVENOUS | Status: AC | PRN
  Administered 2020-05-29: 2 mL via INTRAVENOUS
  Filled 2020-05-29: qty 10

## 2020-05-29 MED ORDER — ASPIRIN 81 MG PO TBEC: 81.0000 mg | DELAYED_RELEASE_TABLET | Freq: Every day | ORAL | 11 refills | Status: DC

## 2020-05-29 MED ORDER — POTASSIUM CHLORIDE CRYS ER 20 MEQ PO TBCR: 40.0000 meq | EXTENDED_RELEASE_TABLET | Freq: Two times a day (BID) | ORAL | Status: DC

## 2020-05-29 MED ORDER — NITROGLYCERIN 0.4 MG SL SUBL: 0.4000 mg | SUBLINGUAL_TABLET | SUBLINGUAL | 3 refills | Status: DC | PRN

## 2020-05-29 MED ORDER — FUROSEMIDE 40 MG PO TABS: ORAL_TABLET | ORAL | 3 refills | Status: DC

## 2020-05-29 NOTE — Plan of Care (Signed)
  Problem: Education: Goal: Knowledge of General Education information will improve Description: Including pain rating scale, medication(s)/side effects and non-pharmacologic comfort measures Outcome: Progressing   Problem: Health Behavior/Discharge Planning: Goal: Ability to manage health-related needs will improve Outcome: Progressing   Problem: Clinical Measurements: Goal: Ability to maintain clinical measurements within normal limits will improve Outcome: Progressing Goal: Will remain free from infection Outcome: Progressing Goal: Diagnostic test results will improve Outcome: Progressing Goal: Respiratory complications will improve Outcome: Progressing Goal: Cardiovascular complication will be avoided Outcome: Progressing   Problem: Activity: Goal: Risk for activity intolerance will decrease Outcome: Progressing   Problem: Nutrition: Goal: Adequate nutrition will be maintained Outcome: Progressing   Problem: Coping: Goal: Level of anxiety will decrease Outcome: Progressing   Problem: Elimination: Goal: Will not experience complications related to bowel motility Outcome: Progressing Goal: Will not experience complications related to urinary retention Outcome: Progressing   Problem: Elimination: Goal: Will not experience complications related to bowel motility Outcome: Progressing Goal: Will not experience complications related to urinary retention Outcome: Progressing   Problem: Pain Managment: Goal: General experience of comfort will improve Outcome: Progressing   Problem: Safety: Goal: Ability to remain free from injury will improve Outcome: Progressing   Problem: Skin Integrity: Goal: Risk for impaired skin integrity will decrease Outcome: Progressing   Problem: Education: Goal: Understanding of cardiac disease, CV risk reduction, and recovery process will improve Outcome: Progressing Goal: Individualized Educational Video(s) Outcome: Progressing    Problem: Activity: Goal: Ability to tolerate increased activity will improve Outcome: Progressing   Problem: Cardiac: Goal: Ability to achieve and maintain adequate cardiovascular perfusion will improve Outcome: Progressing   Problem: Health Behavior/Discharge Planning: Goal: Ability to safely manage health-related needs after discharge will improve Outcome: Progressing   Problem: Education: Goal: Understanding of CV disease, CV risk reduction, and recovery process will improve Outcome: Progressing Goal: Individualized Educational Video(s) Outcome: Progressing   Problem: Activity: Goal: Ability to return to baseline activity level will improve Outcome: Progressing   Problem: Cardiovascular: Goal: Ability to achieve and maintain adequate cardiovascular perfusion will improve Outcome: Progressing Goal: Vascular access site(s) Level 0-1 will be maintained Outcome: Progressing   Problem: Health Behavior/Discharge Planning: Goal: Ability to safely manage health-related needs after discharge will improve Outcome: Progressing

## 2020-05-29 NOTE — Progress Notes (Signed)
Occlusive dressing and gauze placed on right wrist and TR band removed. Site without hematoma, bruising or active bleeding noted. Pt states no pain at site.

## 2020-05-29 NOTE — Progress Notes (Signed)
Progress Note  Patient Name: Ian Mcgee Date of Encounter: 05/29/2020  Primary Cardiologist: Jenne Campus, MD   Subjective   Feels well and is ambulating around the room with no CP or SOB  Inpatient Medications    Scheduled Meds: . amphetamine-dextroamphetamine  20 mg Oral q morning  . aspirin  324 mg Oral NOW   Or  . aspirin  300 mg Rectal NOW  . aspirin EC  81 mg Oral Daily  . furosemide  20 mg Intravenous BID  . heparin  5,000 Units Subcutaneous Q8H  . multivitamin with minerals  1 tablet Oral Daily  . potassium chloride  40 mEq Oral BID  . psyllium  1 packet Oral Daily  . sodium chloride flush  3 mL Intravenous Q12H  . sodium chloride flush  3 mL Intravenous Q12H  . sodium chloride flush  3 mL Intravenous Q12H  . vitamin B-12  1,000 mcg Oral Daily   Continuous Infusions: . sodium chloride    . sodium chloride    . sodium chloride     PRN Meds: sodium chloride, sodium chloride, sodium chloride, acetaminophen, nitroGLYCERIN, ondansetron (ZOFRAN) IV, sodium chloride flush, sodium chloride flush, sodium chloride flush   Vital Signs    Vitals:   05/29/20 0025 05/29/20 0441 05/29/20 0644 05/29/20 0809  BP: (!) 109/59   131/84  Pulse: 73 71  77  Resp: 19 17  17   Temp: 98.2 F (36.8 C) 98.4 F (36.9 C)  98.8 F (37.1 C)  TempSrc: Oral Oral  Oral  SpO2: 100% 93%  94%  Weight:   68.7 kg   Height:        Intake/Output Summary (Last 24 hours) at 05/29/2020 1056 Last data filed at 05/29/2020 0600 Gross per 24 hour  Intake 1207.57 ml  Output 2580 ml  Net -1372.43 ml   Filed Weights   05/28/20 1052 05/29/20 0644  Weight: 72.3 kg 68.7 kg    Telemetry    BiV pacing, 5 beats of NSVT at 10:38 am 4/16 - Personally Reviewed  ECG    BiV pacing - Personally Reviewed  Physical Exam   GEN: No acute distress.   Neck: No JVD Cardiac: regular rhythm, normal rate, no murmurs, rubs, or gallops.  Respiratory: Clear to auscultation bilaterally. GI:  Soft, nontender, non-distended  MS: No edema; No deformity. Neuro:  Nonfocal  Psych: Normal affect   Labs    Chemistry Recent Labs  Lab 05/27/20 1535 05/28/20 1127 05/28/20 1705 05/29/20 0100  NA 142  --  141 137  K 4.4  --  3.4* 3.5  CL 104  --   --  100  CO2 21  --   --  25  GLUCOSE 75  --   --  164*  BUN 14  --   --  13  CREATININE 0.78 0.92  --  1.10  CALCIUM 9.0  --   --  9.0  PROT 6.4  --   --   --   ALBUMIN 4.6  --   --   --   AST 29  --   --   --   ALT 26  --   --   --   ALKPHOS 52  --   --   --   BILITOT 0.3  --   --   --   GFRNONAA  --  >60  --  >60  ANIONGAP  --   --   --  12  Hematology Recent Labs  Lab 05/28/20 1127 05/28/20 1705  WBC 7.9  --   RBC 4.19*  --   HGB 13.9 13.3  HCT 40.6 39.0  MCV 96.9  --   MCH 33.2  --   MCHC 34.2  --   RDW 13.2  --   PLT 257  --     Cardiac EnzymesNo results for input(s): TROPONINI in the last 168 hours. No results for input(s): TROPIPOC in the last 168 hours.   BNP Recent Labs  Lab 05/27/20 1535  PROBNP 1,028*     DDimer No results for input(s): DDIMER in the last 168 hours.   Radiology    CARDIAC CATHETERIZATION  Result Date: 05/28/2020 1.  Severe calcific stenosis of the RCA with 80% stenosis in the proximal vessel 2.  Moderate, flow obstructive stenosis at the ostium of the LAD and segmental disease in the mid LAD, significant flow limitation with an RFR of 0.83 3.  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  Right heart catheterization demonstrating elevated wedge pressure, large V waves, and moderate pulmonary hypertension likely secondary to left heart disease Recommendations: Would consider cardiac surgical revascularization as the patient has hemodynamically significant multivessel coronary artery disease, LV dysfunction, and congestive heart failure.  His coronary artery disease is relatively stable and the patient expresses  desire to discuss this with Dr. Agustin Cree as an outpatient.  During this hospitalization, would diurese him (written for IV Lasix 40 mg tonight followed by 20 mg IV twice daily starting tomorrow morning).  Outpatient follow-up with Dr. Agustin Cree to further discuss revascularization options.  DG CHEST PORT 1 VIEW  Result Date: 05/28/2020 CLINICAL DATA:  Chest pain and shortness of breath. EXAM: PORTABLE CHEST 1 VIEW COMPARISON:  06/26/2017 FINDINGS: Patient's LEFT-sided transvenous pacemaker with leads to the RIGHT atrium, RIGHT ventricle, and coronary sinus. Heart size is accentuated by technique and mildly enlarged. Mildly prominent interstitial markings consistent with mild edema. Trace bilateral pleural effusions. There is asymmetric density in the MEDIAL RIGHT lung base, consistent with early infiltrate. IMPRESSION: 1. Cardiomegaly and mild interstitial edema. 2. RIGHT lower lobe infiltrate. Electronically Signed   By: Nolon Nations M.D.   On: 05/28/2020 13:45    Cardiac Studies  1.  Severe calcific stenosis of the RCA with 80% stenosis in the proximal vessel 2.  Moderate, flow obstructive stenosis at the ostium of the LAD and segmental disease in the mid LAD, significant flow limitation with an RFR of 0.83 3.  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  Right heart catheterization demonstrating elevated wedge pressure, large V waves, and moderate pulmonary hypertension likely secondary to left heart disease   Recommendations: Would consider cardiac surgical revascularization as the patient has hemodynamically significant multivessel coronary artery disease, LV dysfunction, and congestive heart failure.  His coronary artery disease is relatively stable and the patient expresses desire to discuss this with Dr. Agustin Cree as an outpatient.  During this hospitalization, would diurese him (written for IV Lasix 40 mg tonight followed  by 20 mg IV twice daily starting tomorrow morning).  Outpatient follow-up with Dr. Agustin Cree to further discuss revascularization options.  Patient Profile     75 y.o. male  hx of non ischemic cardiomyopathy, last EF 45% with BiV . Hx of CHB, and CAD with cath years ago with 50% LAD, 50% RCA, and hx PVD with carotid disease of 60%.  He  is intolerant to multiple statins and zetia, nexcletol. Has seen Dr. Debara Pickett for lipid clinic,  Recently increased SOB,  Despite being active -plays tennis.  With walking trails has to stop to catch his breath. No chest pain or squeezing.  2-3 days ago did wake with SOB and tightness.  Last time he tried to play tennis but very weak tired and exhausted. LHC showed severe multivessel disease, recommend CABG. Patient would like to discuss with Dr. Agustin Cree due to his wife recently having back surgery, and having a trip planned to the [place] in July. RHC showed large V wave and PCWP of 28 mmHg.  Assessment & Plan   Active Problems:   Angina at rest [facility])   Acute on chronic systolic heart failure ([place])   1. Severe multivessel CAD with LV dysfunction and acute on chronic systolic HF- - needs surgical revascularization, wants to discuss as outpt. -echo pending - outpatient discussion with primary CV regarding CABG due to family issues and timing concerns. - volume overloaded by RHC, responding well to IV lasix.  - ASA 81 mg daily - lasix 20 mg IV BID while in hospital, suspect he will need to go home on lasix 40 mg daily with prn dose for swelling or SOB as well, he is improving from volume standpoint and is negative -2 L for admission.   2. NSVT on tele and mild hypokalemia - while diuresing, may need home going K for supplementation Will give 40 BID x 4 doses if remains in hospital.   3. Carotid disease on ultrasound with 60% stenosis both sides.  Stable  4. HLD has tried many statins and intolerant, has seen Dr. Debara Pickett in lipid clinic. At one point he  was able to tolerate nexletol but he also developed side effects to this.  LDL 107, would continue to try to manage as outpatient.    For questions or updates, please contact [place] Please consult [email or web address] for contact info under        Signed, [name], MD  05/29/2020, 10:56 AM

## 2020-05-29 NOTE — Progress Notes (Signed)
  Echocardiogram 2D Echocardiogram with contrast has been performed.  Merrie Roof F 05/29/2020, 12:14 PM

## 2020-05-29 NOTE — Progress Notes (Signed)
Discharge instructions given to patient, patient understands medication changes and follow up appointments. Daughter picked him up. 1 IV removed without complication site is clean dry and intact.

## 2020-05-29 NOTE — Discharge Summary (Addendum)
Discharge Summary    Patient ID: Ian Mcgee MRN: [id number]; DOB: [birth year]  Admit date: 05/28/2020 Discharge date: 05/29/2020  PCP:  Mateo Flow, MD   [place]  Cardiologist:  Jenne Campus, MD [id number]}    Discharge Diagnoses    Active Problems:   Angina at rest [facility])   Acute on chronic systolic heart failure [facility])    Diagnostic Studies/Procedures    Left heart cath 05/28/20: 1.  Severe calcific stenosis of the RCA with 80% stenosis in the proximal vessel 2.  Moderate, flow obstructive stenosis at the ostium of the LAD and segmental disease in the mid LAD, significant flow limitation with an RFR of 0.83 3.  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  Right heart catheterization demonstrating elevated wedge pressure, large V waves, and moderate pulmonary hypertension likely secondary to left heart disease  Recommendations: Would consider cardiac surgical revascularization as the patient has hemodynamically significant multivessel coronary artery disease, LV dysfunction, and congestive heart failure.  His coronary artery disease is relatively stable and the patient expresses desire to discuss this with Dr. Agustin Cree as an outpatient.  During this hospitalization, would diurese him (written for IV Lasix 40 mg tonight followed by 20 mg IV twice daily starting tomorrow morning).  Outpatient follow-up with Dr. Agustin Cree to further discuss revascularization options.  _____________   Echo 05/29/20: 1. At least moderate-severe mitral valve regurgitation. Mechanism appears  to be secondary Carpentier IIIb, with a posteriorly directed, eccentric  jet of MR due to restricted posterior leaflet and relative override of  anterior leaflet. Significant splay  artifact in 2 chamber view suggests MR may be severe, and large V wave  seen on right heart cath. No definite flail segments seen. The  mitral  valve is grossly normal. Moderate to severe mitral valve regurgitation. No  evidence of mitral stenosis.  2. Left ventricular ejection fraction by 3D volume is 40 %. The left  ventricle has moderately decreased function. The left ventricle  demonstrates regional wall motion abnormalities (see scoring  diagram/findings for description). The left ventricular  internal cavity size was mildly dilated. There is mild asymmetric left  ventricular hypertrophy of the posterior-lateral segment. Left ventricular  diastolic parameters are consistent with Grade II diastolic dysfunction  (pseudonormalization).  3. Right ventricular systolic function is normal. The right ventricular  size is normal. There is normal pulmonary artery systolic pressure. The  estimated right ventricular systolic pressure is 54.5 mmHg.  4. Left atrial size was severely dilated.  5. Right atrial size was mildly dilated.  6. The aortic valve is grossly normal. There is mild calcification of the  aortic valve. Aortic valve regurgitation is not visualized. Mild aortic  valve sclerosis is present, with no evidence of aortic valve stenosis.  7. The inferior vena cava is normal in size with greater than 50%  respiratory variability, suggesting right atrial pressure of 3 mmHg.   History of Present Illness     Ian Mcgee is a 75 y.o. male with hx of non ischemic cardiomyopathy, last EF 45% with BiV . Hx of CHB, and CAD with cath years ago with 50% LAD, 50% RCA, and hx PVD with carotid disease of 60%.  He is intolerant to multiple statins and zetia, nexcletol. Has seen Dr. Debara Pickett for lipid clinic,  Recently increased SOB,  Despite being active -plays tennis.  With walking trails has to stop to catch his breath. No  chest pain or squeezing.  2-3 days ago did wake with SOB and tightness.  Last time he tried to play tennis but very weak tired and exhausted.      Last echo 03/15/20 with EF 45-50% mild LVH mild MVR,    Ian Mcgee with above hx now presents for cardiac cath today.  Labs yesterday  Troponin 36,  BUN 14, Cr 0.78, Na 142,  Pro BNP 1028,   No chest pain overnight.  Pt has elevated BP coming in for cath, wife recently with significant back surgery and at home recovering.  No chest pain now.  No SOB currently.  No JVD sitting up in chair. Pt is normally very active and has had to decrease activity significantly.     Hospital Course     Consultants: none  Severe multivessel CAD Pt presented for scheduled angiography for increasing dyspnea and chest tightness. Heart cath revealed severe multivessel disease and surgical revascularization was recommended. He was offered surgical consultation during this admission, but declined.  His wife is at home recovering from back surgery. He wishes to discharge and talk to Dr. Agustin Cree about his options regarding surgery. Will discharge with 81 mg ASA. Will hold plavix for possible CABG.    Moderate to severe MR Echocardiogram with moderate to severe MR with restricted posterior leaflet and relative override of anterior leaflet. May need to consider TEE prior to CABG to further evaluate MR.    Acute on Chronic combined heart failure Nonischemic cardiomyopathy CHB s/p PPM --> upgraded to BiV Echo this admission with EF 40%.  Has responded well to IV lasix. Is overall net negative 1.3 L with 2.5 L urine output yesterday. Has not been on GDMT. Pt self-discontinued BB and statin. Previously intolerant to low dose ACEI. I will defer further discussion regarding GDMT to Dr. Agustin Cree. I will discharge on 40 mg lasix daily in case of worsening edema or dyspnea.    Carotid artery disease Bilateral 60% stenosis Focus on risk factor management Follow with routine [place]   Hyperlipidemia with LDL goal < 70 05/29/2020: Cholesterol 196; HDL 71; LDL Cholesterol 107; Triglycerides 92; VLDL 18 Intolerant to many statins. Has seen Dr. Debara Pickett in lipid clinic. He did not  tolerate repatha, nexlitol, and zetia.    He was instructed to take daily ASA, has declined in the past. He was instructed to weigh daily and given PRN lasix for weight gain, edema, or worsening dyspnea. He was instructed to refrain from exertional activities and sports until evaluated in OP clinic. He was provided with PRN nitro. He has not tolerated/self-discontinued GDMT for CHF in the past, including BB and ACEI. I will defer further medication titration to his primary cardiologist.    Did the patient have an acute coronary syndrome (MI, NSTEMI, STEMI, etc) this admission?:  No                               Did the patient have a percutaneous coronary intervention (stent / angioplasty)?:  No.       _____________  Discharge Vitals Blood pressure (!) 150/95, pulse 75, temperature 98.7 F (37.1 C), temperature source Oral, resp. rate 18, height 5\' 11"  (1.803 m), weight 68.7 kg, SpO2 100 %.  Filed Weights   05/28/20 1052 05/29/20 0644  Weight: 72.3 kg 68.7 kg    Labs & Radiologic Studies    CBC Recent Labs    05/28/20 1127 05/28/20 1705  WBC 7.9  --   HGB 13.9 13.3  HCT 40.6 39.0  MCV 96.9  --   PLT 257  --    Basic Metabolic Panel Recent Labs    05/27/20 1535 05/28/20 1127 05/28/20 1705 05/29/20 0100  NA 142  --  141 137  K 4.4  --  3.4* 3.5  CL 104  --   --  100  CO2 21  --   --  25  GLUCOSE 75  --   --  164*  BUN 14  --   --  13  CREATININE 0.78 0.92  --  1.10  CALCIUM 9.0  --   --  9.0   Liver Function Tests Recent Labs    05/27/20 1535  AST 29  ALT 26  ALKPHOS 52  BILITOT 0.3  PROT 6.4  ALBUMIN 4.6   No results for input(s): LIPASE, AMYLASE in the last 72 hours. High Sensitivity Troponin:   No results for input(s): TROPONINIHS in the last 720 hours.  BNP Invalid input(s): POCBNP D-Dimer No results for input(s): DDIMER in the last 72 hours. Hemoglobin A1C No results for input(s): HGBA1C in the last 72 hours. Fasting Lipid Panel Recent Labs     05/29/20 0100  CHOL 196  HDL 71  LDLCALC 107*  TRIG 92  CHOLHDL 2.8   Thyroid Function Tests No results for input(s): TSH, T4TOTAL, T3FREE, THYROIDAB in the last 72 hours.  Invalid input(s): FREET3 _____________  CARDIAC CATHETERIZATION  Result Date: 05/28/2020 1.  Severe calcific stenosis of the RCA with 80% stenosis in the proximal vessel 2.  Moderate, flow obstructive stenosis at the ostium of the LAD and segmental disease in the mid LAD, significant flow limitation with an RFR of 0.83 3.  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  Right heart catheterization demonstrating elevated wedge pressure, large V waves, and moderate pulmonary hypertension likely secondary to left heart disease Recommendations: Would consider cardiac surgical revascularization as the patient has hemodynamically significant multivessel coronary artery disease, LV dysfunction, and congestive heart failure.  His coronary artery disease is relatively stable and the patient expresses desire to discuss this with Dr. Agustin Cree as an outpatient.  During this hospitalization, would diurese him (written for IV Lasix 40 mg tonight followed by 20 mg IV twice daily starting tomorrow morning).  Outpatient follow-up with Dr. Agustin Cree to further discuss revascularization options.  DG CHEST PORT 1 VIEW  Result Date: 05/28/2020 CLINICAL DATA:  Chest pain and shortness of breath. EXAM: PORTABLE CHEST 1 VIEW COMPARISON:  06/26/2017 FINDINGS: Patient's LEFT-sided transvenous pacemaker with leads to the RIGHT atrium, RIGHT ventricle, and coronary sinus. Heart size is accentuated by technique and mildly enlarged. Mildly prominent interstitial markings consistent with mild edema. Trace bilateral pleural effusions. There is asymmetric density in the MEDIAL RIGHT lung base, consistent with early infiltrate. IMPRESSION: 1. Cardiomegaly and mild interstitial edema. 2. RIGHT  lower lobe infiltrate. Electronically Signed   By: Nolon Nations M.D.   On: 05/28/2020 13:45   ECHOCARDIOGRAM COMPLETE  Result Date: 05/29/2020    ECHOCARDIOGRAM REPORT   Patient Name:   YAHSHUA THIBAULT Date of Exam: 05/29/2020 Medical Rec #:  [id number]         Height:       71.0 in Accession #:    [id number]        Weight:       151.4 lb Date of Birth:  01-31-1946  BSA:          1.873 m Patient Age:    89 years          BP:           150/95 mmHg Patient Gender: M                 HR:           90 bpm. Exam Location:  Inpatient Procedure: 2D Echo, Cardiac Doppler, Color Doppler and Intracardiac            Opacification Agent Indications:    R07.9* Chest pain, unspecified  History:        Patient has prior history of Echocardiogram examinations, most                 recent 03/15/2020. CAD; Pacemaker.  Sonographer:    Merrie Roof RDCS Referring Phys: [place]  1. At least moderate-severe mitral valve regurgitation. Mechanism appears to be secondary Carpentier IIIb, with a posteriorly directed, eccentric jet of MR due to restricted posterior leaflet and relative override of anterior leaflet. Significant splay artifact in 2 chamber view suggests MR may be severe, and large V wave seen on right heart cath. No definite flail segments seen. The mitral valve is grossly normal. Moderate to severe mitral valve regurgitation. No evidence of mitral stenosis.  2. Left ventricular ejection fraction by 3D volume is 40 %. The left ventricle has moderately decreased function. The left ventricle demonstrates regional wall motion abnormalities (see scoring diagram/findings for description). The left ventricular internal cavity size was mildly dilated. There is mild asymmetric left ventricular hypertrophy of the posterior-lateral segment. Left ventricular diastolic parameters are consistent with Grade II diastolic dysfunction (pseudonormalization).  3. Right ventricular systolic function is normal.  The right ventricular size is normal. There is normal pulmonary artery systolic pressure. The estimated right ventricular systolic pressure is 38.1 mmHg.  4. Left atrial size was severely dilated.  5. Right atrial size was mildly dilated.  6. The aortic valve is grossly normal. There is mild calcification of the aortic valve. Aortic valve regurgitation is not visualized. Mild aortic valve sclerosis is present, with no evidence of aortic valve stenosis.  7. The inferior vena cava is normal in size with greater than 50% respiratory variability, suggesting right atrial pressure of 3 mmHg. Conclusion(s)/Recommendation(s): No left ventricular mural or apical thrombus/thrombi. FINDINGS  Left Ventricle: Left ventricular ejection fraction by 3D volume is 40 %. The left ventricle has moderately decreased function. The left ventricle demonstrates regional wall motion abnormalities. Definity contrast agent was given IV to delineate the left  ventricular endocardial borders. The left ventricular internal cavity size was mildly dilated. There is mild asymmetric left ventricular hypertrophy of the posterior-lateral segment. Left ventricular diastolic parameters are consistent with Grade II diastolic dysfunction (pseudonormalization).  LV Wall Scoring: The mid and distal lateral wall, mid anterolateral segment, and apical anterior segment are hypokinetic. Right Ventricle: The right ventricular size is normal. No increase in right ventricular wall thickness. Right ventricular systolic function is normal. There is normal pulmonary artery systolic pressure. The tricuspid regurgitant velocity is 2.71 m/s, and  with an assumed right atrial pressure of 3 mmHg, the estimated right ventricular systolic pressure is 82.9 mmHg. Left Atrium: Left atrial size was severely dilated. Right Atrium: Right atrial size was mildly dilated. Pericardium: There is no evidence of pericardial effusion. Mitral Valve: At least moderate-severe mitral valve  regurgitation. Mechanism appears to be secondary  Carpentier IIIb, with a posteriorly directed, eccentric jet of MR due to restricted posterior leaflet and relative override of anterior leaflet. Significant splay artifact in 2 chamber view suggests MR may be severe, and large V wave seen on right heart cath. No definite flail segments seen. The mitral valve is grossly normal. Moderate to severe mitral valve regurgitation. No evidence of mitral valve stenosis. Tricuspid Valve: The tricuspid valve is normal in structure. Tricuspid valve regurgitation is mild . No evidence of tricuspid stenosis. Aortic Valve: The aortic valve is grossly normal. There is mild calcification of the aortic valve. Aortic valve regurgitation is not visualized. Mild aortic valve sclerosis is present, with no evidence of aortic valve stenosis. Aortic valve mean gradient  measures 8.0 mmHg. Aortic valve peak gradient measures 14.1 mmHg. Aortic valve area, by VTI measures 2.08 cm. Pulmonic Valve: The pulmonic valve was grossly normal. Pulmonic valve regurgitation is trivial. No evidence of pulmonic stenosis. Aorta: The aortic root is normal in size and structure. Venous: The inferior vena cava is normal in size with greater than 50% respiratory variability, suggesting right atrial pressure of 3 mmHg. IAS/Shunts: No atrial level shunt detected by color flow Doppler. Additional Comments: A device lead is visualized in the right atrium and right ventricle.  LEFT VENTRICLE PLAX 2D LVIDd:         5.90 cm         Diastology LVIDs:         4.90 cm         LV e' medial:    7.18 cm/s LV PW:         1.10 cm         LV E/e' medial:  14.2 LV IVS:        0.90 cm         LV e' lateral:   8.27 cm/s LVOT diam:     2.20 cm         LV E/e' lateral: 12.3 LV SV:         59 LV SV Index:   31 LVOT Area:     3.80 cm        3D Volume EF                                LV 3D EF:    Left                                             ventricular LV Volumes (MOD)                             ejection LV vol d, MOD    169.0 ml                   fraction by A2C:                                        3D volume LV vol d, MOD    175.0 ml                   is 40 %. A4C: LV vol s, MOD    101.0  ml A2C:                           3D Volume EF: LV vol s, MOD    104.0 ml      3D EF:        40 % A4C:                           LV EDV:       226 ml LV SV MOD A2C:   68.0 ml       LV ESV:       136 ml LV SV MOD A4C:   175.0 ml      LV SV:        90 ml LV SV MOD BP:    79.7 ml RIGHT VENTRICLE          IVC RV Basal diam:  3.20 cm  IVC diam: 1.20 cm LEFT ATRIUM              Index       RIGHT ATRIUM           Index LA diam:        3.50 cm  1.87 cm/m  RA Area:     17.80 cm LA Vol (A2C):   130.0 ml 69.40 ml/m RA Volume:   54.30 ml  28.99 ml/m LA Vol (A4C):   59.2 ml  31.60 ml/m LA Biplane Vol: 93.9 ml  50.13 ml/m  AORTIC VALVE AV Area (Vmax):    1.76 cm AV Area (Vmean):   1.49 cm AV Area (VTI):     2.08 cm AV Vmax:           188.00 cm/s AV Vmean:          132.000 cm/s AV VTI:            0.281 m AV Peak Grad:      14.1 mmHg AV Mean Grad:      8.0 mmHg LVOT Vmax:         87.00 cm/s LVOT Vmean:        51.800 cm/s LVOT VTI:          0.154 m LVOT/AV VTI ratio: 0.55  AORTA Ao Root diam: 3.10 cm Ao Asc diam:  2.70 cm MITRAL VALVE                TRICUSPID VALVE MV Area (PHT): 4.21 cm     TR Peak grad:   29.4 mmHg MV Decel Time: 180 msec     TR Vmax:        271.00 cm/s MV E velocity: 102.00 cm/s MV A velocity: 86.50 cm/s   SHUNTS MV E/A ratio:  1.18         Systemic VTI:  0.15 m                             Systemic Diam: 2.20 cm Cherlynn Kaiser MD Electronically signed by Cherlynn Kaiser MD Signature Date/Time: 05/29/2020/12:37:08 PM    Final    Disposition   Pt is being discharged home today in good condition.  Follow-up Plans & Appointments     Follow-up Information    Park Liter, MD Follow up in 1 week(s).   Specialty: Cardiology Contact information: [address] [place]  [phone]  Discharge Instructions    Diet - low sodium heart healthy   Complete by: As directed    Discharge instructions   Complete by: As directed    It is important to take 81 mg ASA daily for obstructive coronary artery disease.   Weigh daily every morning right after waking and keep BP log. May take 40 mg lasix if weight increases more than 3 lbs overnight or 5 lbs in 1 week, or if you have worsening dyspnea or edema.   Refrain from exertional activities and sports until evaluated by your cardiologist.  No driving for 2 days. No lifting over 5 lbs for 1 week. No sexual activity for 1 week. Keep procedure site clean & dry. If you notice increased pain, swelling, bleeding or pus, call/return!  You may shower, but no soaking baths/hot tubs/pools for 1 week.   Increase activity slowly   Complete by: As directed       Discharge Medications   Allergies as of 05/29/2020      Reactions   Ciprofloxacin Other (See Comments)   Bleeding (intolerance)   Crestor [rosuvastatin Calcium] Other (See Comments)   Muscle twinges   Nexletol [bempedoic Acid]    Muscle and joint pain      Medication List    STOP taking these medications   tadalafil 10 MG tablet Commonly known as: CIALIS     TAKE these medications   aspirin 81 MG EC tablet Take 1 tablet (81 mg total) by mouth daily. Swallow whole. Start taking on: May 30, 2020   B-12 (Methylcobalamin) 1000 MCG Subl Place 1 tablet under the tongue daily.   furosemide 40 MG tablet Commonly known as: Lasix Take 40 mg daily for weight gain of 3 lbs overnight or 5 lbs in 1 week.   multivitamin capsule Take 1 capsule by mouth daily. Unknown strength   nitroGLYCERIN 0.4 MG SL tablet Commonly known as: NITROSTAT Place 1 tablet (0.4 mg total) under the tongue every 5 (five) minutes x 3 doses as needed for chest pain.   PROBIOTIC PO Take 1 tablet by mouth daily.   psyllium 58.6 % packet Commonly known as:  METAMUCIL Take 1 packet by mouth daily.          Outstanding Labs/Studies     Duration of Discharge Encounter   Greater than 30 minutes including physician time.  Signed, Tami Lin Duke, PA 05/29/2020, 1:36 PM   Patient seen and examined with Doreene Adas PA.  Agree as above, with the following exceptions and changes as noted below. See progress note from today. Discussed details of CABG and mitral valve assessment in detail with patient. Would consider TEE prior to CABG planning to ensure MR is purely secondary, given how eccentric the jet looks in some views, which may necessitate MV repair. Gen: NAD, CV: RRR, soft systolic murmur, Lungs: clear, Abd: soft, Extrem: Warm, well perfused, no edema, Neuro/Psych: alert and oriented x 3, normal mood and affect. All available labs, radiology testing, previous records reviewed. Plan for discharge home today.  [name], MD

## 2020-05-31 ENCOUNTER — Other Ambulatory Visit: Payer: Self-pay

## 2020-05-31 ENCOUNTER — Ambulatory Visit (INDEPENDENT_AMBULATORY_CARE_PROVIDER_SITE_OTHER): Payer: Medicare Other | Admitting: Cardiology

## 2020-05-31 ENCOUNTER — Telehealth: Payer: Self-pay | Admitting: Emergency Medicine

## 2020-05-31 ENCOUNTER — Encounter (HOSPITAL_COMMUNITY): Payer: Self-pay | Admitting: Cardiovascular Disease

## 2020-05-31 VITALS — BP 144/62 | HR 77 | Ht 71.0 in | Wt 156.0 lb

## 2020-05-31 DIAGNOSIS — I420 Dilated cardiomyopathy: Secondary | ICD-10-CM | POA: Diagnosis not present

## 2020-05-31 DIAGNOSIS — I2510 Atherosclerotic heart disease of native coronary artery without angina pectoris: Secondary | ICD-10-CM | POA: Diagnosis not present

## 2020-05-31 DIAGNOSIS — I739 Peripheral vascular disease, unspecified: Secondary | ICD-10-CM

## 2020-05-31 DIAGNOSIS — Z950 Presence of cardiac pacemaker: Secondary | ICD-10-CM | POA: Diagnosis not present

## 2020-05-31 DIAGNOSIS — E785 Hyperlipidemia, unspecified: Secondary | ICD-10-CM | POA: Diagnosis not present

## 2020-05-31 DIAGNOSIS — I340 Nonrheumatic mitral (valve) insufficiency: Secondary | ICD-10-CM | POA: Diagnosis not present

## 2020-05-31 LAB — POCT I-STAT 7, (LYTES, BLD GAS, ICA,H+H)
Acid-Base Excess: 0 mmol/L (ref 0.0–2.0)
Bicarbonate: 23.7 mmol/L (ref 20.0–28.0)
Calcium, Ion: 1.18 mmol/L (ref 1.15–1.40)
HCT: 40 % (ref 39.0–52.0)
Hemoglobin: 13.6 g/dL (ref 13.0–17.0)
O2 Saturation: 98 %
Potassium: 3.6 mmol/L (ref 3.5–5.1)
Sodium: 140 mmol/L (ref 135–145)
TCO2: 25 mmol/L (ref 22–32)
pCO2 arterial: 33.9 mmHg (ref 32.0–48.0)
pH, Arterial: 7.452 — ABNORMAL HIGH (ref 7.350–7.450)
pO2, Arterial: 99 mmHg (ref 83.0–108.0)

## 2020-05-31 MED FILL — Midazolam HCl Inj 2 MG/2ML (Base Equivalent): INTRAMUSCULAR | Qty: 2 | Status: AC

## 2020-05-31 MED FILL — Fentanyl Citrate Preservative Free (PF) Inj 100 MCG/2ML: INTRAMUSCULAR | Qty: 2 | Status: AC

## 2020-05-31 NOTE — Patient Instructions (Signed)
Medication Instructions:  Your physician recommends that you continue on your current medications as directed. Please refer to the Current Medication list given to you today.  *If you need a refill on your cardiac medications before your next appointment, please call your pharmacy*   Lab Work: None If you have labs (blood work) drawn today and your tests are completely normal, you will receive your results only by: Marland Kitchen MyChart Message (if you have MyChart) OR . A paper copy in the mail If you have any lab test that is abnormal or we need to change your treatment, we will call you to review the results.   Testing/Procedures: None   Follow-Up: At [facility], you and your health needs are our priority.  As part of our continuing mission to provide you with exceptional heart care, we have created designated Provider Care Teams.  These Care Teams include your primary Cardiologist (physician) and Advanced Practice Providers (APPs -  Physician Assistants and Nurse Practitioners) who all work together to provide you with the care you need, when you need it.  We recommend signing up for the patient portal called "MyChart".  Sign up information is provided on this After Visit Summary.  MyChart is used to connect with patients for Virtual Visits (Telemedicine).  Patients are able to view lab/test results, encounter notes, upcoming appointments, etc.  Non-urgent messages can be sent to your provider as well.   To learn more about what you can do with MyChart, go to [email or web address].    Your next appointment:    We will call you

## 2020-05-31 NOTE — Telephone Encounter (Signed)
Called patient added him on to our schedule for today per Dr. Agustin Cree.

## 2020-05-31 NOTE — H&P (View-Only) (Signed)
Cardiology Office Note:    Date:  05/31/2020   ID:  Ian Mcgee, DOB [birth year], MRN [id number]  PCP:  Mateo Flow, MD  Cardiologist:  Jenne Campus, MD    Referring MD: Mateo Flow, MD   Chief Complaint  Patient presents with  . Results    History of Present Illness:    Ian Mcgee is a 75 y.o. male with past medical history significant for coronary artery disease, peripheral vascular disease in form of carotid arterial disease, cardiomyopathy, BiV pacer.  Last week he showed up in my office with complaining of shortness of breath he also noticed quite significant decrease in ability to exercise.  He is always very athletic always like to play tennis as well as like to walk and lately he noticed that it became difficult.  He became short of breath quite easily sometimes have to stop while walking also described 1 episode last week that appears to be paroxysmal nocturnal dyspnea.  He denies have any chest pain tightness squeezing pressure burning chest but episode of shortness of breath became very worrisome and after checking his troponin I as well as proBNP, both were elevated with high-sensitivity troponin T of 36 with upper limits of normal of 22, he was admitted to [facility] where he had cardiac catheterization performed.  Cardiac catheterization showed significant right coronary artery as well as LAD and diagonal lesion.  He does have small circumflex artery with separate orifice.  He was also noted to have quite significant mitral regurgitation assessed as moderate to severe with Carpentier 3B classification.  He was offered consultation with cardiothoracic surgery however he wanted to think it over before making final decision about potentially having bypass surgery.  He spent the weekend at home talking to his wife and decided that he would like to explore option of coronary artery bypass graft.  Since the time he came home he is doing better he did not take any  more Lasix.  He does not have shortness of breath there is no chest pain tightness squeezing pressure burning chest but he does not exercise and simply take it easy.  Past Medical History:  Diagnosis Date  . Attention deficit disorder (ADD) 05/05/2019  . Cardiac pacemaker 05/10/2017  . Cardiomyopathy ([place]) 05/08/2017  . Cardiomyopathy ([place]) 05/08/2017  . Complete heart block ([place]) 06/10/2015  . Coronary artery disease 05/15/2017   40% left main, 50% LAD, 50% RCA recent cardiac catheterization from March 2019  . Dyslipidemia 05/08/2017  . Nonischemic cardiomyopathy ([place]) 06/25/2017  . Pacemaker reprogramming/check 06/10/2015  . Peripheral vascular disease ([place]) 05/08/2017   Noncritical bilateral carotid arterial disease  . Pneumonia   . Status post biventricular pacemaker 07/16/2017  . Syncope and collapse     Past Surgical History:  Procedure Laterality Date  . Basel Cell Cancer    . BIV UPGRADE N/A 06/25/2017   Procedure: BIV UPGRADE;  Surgeon: Constance Haw, MD;  Location: [facility];  Service: Cardiovascular;  Laterality: N/A;  . CATARACT EXTRACTION    . INSERT / REPLACE / REMOVE PACEMAKER    . INTRAVASCULAR PRESSURE WIRE/FFR STUDY N/A 05/28/2020   Procedure: INTRAVASCULAR PRESSURE WIRE/FFR STUDY;  Surgeon: [name], MD;  Location: [facility];  Service: Cardiovascular;  Laterality: N/A;  . KNEE SURGERY Right    ACL Reconstruction  . LEFT HEART CATH AND CORONARY ANGIOGRAPHY N/A 05/10/2017   Procedure: LEFT HEART CATH AND CORONARY ANGIOGRAPHY;  Surgeon: Glenetta Hew  W, MD;  Location: [facility];  Service: Cardiovascular;  Laterality: N/A;  . RIGHT/LEFT HEART CATH AND CORONARY ANGIOGRAPHY N/A 05/28/2020   Procedure: RIGHT/LEFT HEART CATH AND CORONARY ANGIOGRAPHY;  Surgeon: [name], MD;  Location: [facility];  Service: Cardiovascular;  Laterality: N/A;  . VASECTOMY      Current Medications: Current Meds  Medication Sig  . aspirin EC 81  MG EC tablet Take 1 tablet (81 mg total) by mouth daily. Swallow whole.  . B-12, Methylcobalamin, 1000 MCG SUBL Place 1 tablet under the tongue daily.  . furosemide (LASIX) 40 MG tablet Take 40 mg daily for weight gain of 3 lbs overnight or 5 lbs in 1 week. (Patient taking differently: 40 mg. Take 40 mg daily for weight gain of 3 lbs overnight or 5 lbs in 1 week.)  . Multiple Vitamin (MULTIVITAMIN) capsule Take 1 capsule by mouth daily. Unknown strength  . nitroGLYCERIN (NITROSTAT) 0.4 MG SL tablet Place 1 tablet (0.4 mg total) under the tongue every 5 (five) minutes x 3 doses as needed for chest pain.  . Probiotic Product (PROBIOTIC PO) Take 1 tablet by mouth daily. Unknown strength  . psyllium (METAMUCIL) 58.6 % packet Take 1 packet by mouth daily. Unknown strength     Allergies:   Ciprofloxacin, Crestor [rosuvastatin calcium], and Nexletol [bempedoic acid]   Social History   Socioeconomic History  . Marital status: Married    Spouse name: Not on file  . Number of children: Not on file  . Years of education: Not on file  . Highest education level: Not on file  Occupational History  . Occupation: Engineer, drilling  Tobacco Use  . Smoking status: Former Smoker    Types: Cigarettes  . Smokeless tobacco: Never Used  Vaping Use  . Vaping Use: Never used  Substance and Sexual Activity  . Alcohol use: Yes    Comment: Wine with dinner  . Drug use: Never  . Sexual activity: Not on file  Other Topics Concern  . Not on file  Social History Narrative  . Not on file   Social Determinants of Health   Financial Resource Strain: Not on file  Food Insecurity: Not on file  Transportation Needs: Not on file  Physical Activity: Not on file  Stress: Not on file  Social Connections: Not on file     Family History: The patient's family history includes CAD in his brother, father, and mother; Healthy in his brother; Hypertension in his mother; Leukemia in his father; Osteoporosis in  his mother; Ovarian cancer in his sister; Rheumatic fever in his brother. ROS:   Please see the history of present illness.    All 14 point review of systems negative except as described per history of present illness  EKGs/Labs/Other Studies Reviewed:      Recent Labs: 05/27/2020: ALT 26; NT-Pro BNP 1,028 05/28/2020: Hemoglobin 13.3; Hemoglobin 13.6; Platelets 257 05/29/2020: BUN 13; Creatinine, Ser 1.10; Potassium 3.5; Sodium 137  Recent Lipid Panel    Component Value Date/Time   CHOL 196 05/29/2020 0100   TRIG 92 05/29/2020 0100   HDL 71 05/29/2020 0100   CHOLHDL 2.8 05/29/2020 0100   VLDL 18 05/29/2020 0100   LDLCALC 107 (H) 05/29/2020 0100    Physical Exam:    VS:  BP (!) 144/62 (BP Location: Left Arm, Patient Position: Sitting)   Pulse 77   Ht 5\' 11"  (1.803 m)   Wt 156 lb (70.8 kg)   SpO2 97%  BMI 21.76 kg/m     Wt Readings from Last 3 Encounters:  05/31/20 156 lb (70.8 kg)  05/29/20 151 lb 6.4 oz (68.7 kg)  05/27/20 165 lb (74.8 kg)     GEN:  Well nourished, well developed in no acute distress HEENT: Normal NECK: No JVD; No carotid bruits LYMPHATICS: No lymphadenopathy CARDIAC: RRR, holosystolic murmur best heard left upper portion of the sternum grade 2/6 to 3/6, no rubs, no gallops RESPIRATORY:  Clear to auscultation without rales, wheezing or rhonchi  ABDOMEN: Soft, non-tender, non-distended MUSCULOSKELETAL:  No edema; No deformity  SKIN: Warm and dry LOWER EXTREMITIES: no swelling NEUROLOGIC:  Alert and oriented x 3 PSYCHIATRIC:  Normal affect   ASSESSMENT:    1. Coronary artery disease involving native coronary artery of native heart without angina pectoris   2. Dilated cardiomyopathy ([place])   3. Nonrheumatic mitral valve regurgitation   4. Peripheral vascular disease ([place])   5. Status post biventricular pacemaker   6. Dyslipidemia    PLAN:    In order of problems listed above:  1. Coronary artery disease with RCA LAD and diagonal branch  disease with cardiomyopathy diminished ejection fraction of 40% and moderate to severe mitral regurgitation.  We spent a great of time talking today about options for the situation.  We elected to pursue transesophageal echocardiogram to assess significance of mitral regurgitation more importantly function and structure of the valve, after that we will schedule him to see his surgeon to talk about potentially doing coronary bypass graft surgery. 2. Peripheral vascular disease last carotid ultrasound done in January showing 40 to 59% on the right internal carotic artery, 40 to 59% on the left internal carotic artery.  He is on antiplatelet therapy in form of aspirin. 3. Dyslipidemia he is intolerant to multiple statin including PCSK9 agent we tried all possible statin I will give him prescription for Zetia 10 mg daily because it looks like that is the medication we have not tried yet.  He has been followed by our lipid clinic. 4. Cardiomyopathy with ejection fraction 40%, with this degree of mitral regurgitation is much worse.  We will do TEE trying to determine etiology of his mitral regurgitation.  He is intolerant to ACE inhibitor, will try ramipril he is intolerant to losartan, he is intolerant to Entresto.  He is also intolerant to beta-blocker.  We will have very few options left however we had a long discussion with him today as well as his wife and we concluded that he may need to adjust to some of the side effects of those medications to help with that his ejection fraction will improve.   Medication Adjustments/Labs and Tests Ordered: Current medicines are reviewed at length with the patient today.  Concerns regarding medicines are outlined above.  No orders of the defined types were placed in this encounter.  Medication changes: No orders of the defined types were placed in this encounter.   Signed, Park Liter, MD, [facility] 05/31/2020 4:55 PM    [place]

## 2020-05-31 NOTE — Progress Notes (Signed)
Cardiology Office Note:    Date:  05/31/2020   ID:  Ian Mcgee, DOB 1945/11/13, MRN [id number]  PCP:  Mateo Flow, MD  Cardiologist:  Jenne Campus, MD    Referring MD: Mateo Flow, MD   Chief Complaint  Patient presents with  . Results    History of Present Illness:    Ian Mcgee is a 75 y.o. male with past medical history significant for coronary artery disease, peripheral vascular disease in form of carotid arterial disease, cardiomyopathy, BiV pacer.  Last week he showed up in my office with complaining of shortness of breath he also noticed quite significant decrease in ability to exercise.  He is always very athletic always like to play tennis as well as like to walk and lately he noticed that it became difficult.  He became short of breath quite easily sometimes have to stop while walking also described 1 episode last week that appears to be paroxysmal nocturnal dyspnea.  He denies have any chest pain tightness squeezing pressure burning chest but episode of shortness of breath became very worrisome and after checking his troponin I as well as proBNP, both were elevated with high-sensitivity troponin T of 36 with upper limits of normal of 22, he was admitted to [facility] where he had cardiac catheterization performed.  Cardiac catheterization showed significant right coronary artery as well as LAD and diagonal lesion.  He does have small circumflex artery with separate orifice.  He was also noted to have quite significant mitral regurgitation assessed as moderate to severe with Carpentier 3B classification.  He was offered consultation with cardiothoracic surgery however he wanted to think it over before making final decision about potentially having bypass surgery.  He spent the weekend at home talking to his wife and decided that he would like to explore option of coronary artery bypass graft.  Since the time he came home he is doing better he did not take any  more Lasix.  He does not have shortness of breath there is no chest pain tightness squeezing pressure burning chest but he does not exercise and simply take it easy.  Past Medical History:  Diagnosis Date  . Attention deficit disorder (ADD) 05/05/2019  . Cardiac pacemaker 05/10/2017  . Cardiomyopathy ([place]) 05/08/2017  . Cardiomyopathy ([place]) 05/08/2017  . Complete heart block ([place]) 06/10/2015  . Coronary artery disease 05/15/2017   40% left main, 50% LAD, 50% RCA recent cardiac catheterization from March 2019  . Dyslipidemia 05/08/2017  . Nonischemic cardiomyopathy ([place]) 06/25/2017  . Pacemaker reprogramming/check 06/10/2015  . Peripheral vascular disease ([place]) 05/08/2017   Noncritical bilateral carotid arterial disease  . Pneumonia   . Status post biventricular pacemaker 07/16/2017  . Syncope and collapse     Past Surgical History:  Procedure Laterality Date  . Basel Cell Cancer    . BIV UPGRADE N/A 06/25/2017   Procedure: BIV UPGRADE;  Surgeon: Constance Haw, MD;  Location: [facility];  Service: Cardiovascular;  Laterality: N/A;  . CATARACT EXTRACTION    . INSERT / REPLACE / REMOVE PACEMAKER    . INTRAVASCULAR PRESSURE WIRE/FFR STUDY N/A 05/28/2020   Procedure: INTRAVASCULAR PRESSURE WIRE/FFR STUDY;  Surgeon: [name], MD;  Location: [facility];  Service: Cardiovascular;  Laterality: N/A;  . KNEE SURGERY Right    ACL Reconstruction  . LEFT HEART CATH AND CORONARY ANGIOGRAPHY N/A 05/10/2017   Procedure: LEFT HEART CATH AND CORONARY ANGIOGRAPHY;  Surgeon: Glenetta Hew  W, MD;  Location: [facility];  Service: Cardiovascular;  Laterality: N/A;  . RIGHT/LEFT HEART CATH AND CORONARY ANGIOGRAPHY N/A 05/28/2020   Procedure: RIGHT/LEFT HEART CATH AND CORONARY ANGIOGRAPHY;  Surgeon: [name], MD;  Location: [facility];  Service: Cardiovascular;  Laterality: N/A;  . VASECTOMY      Current Medications: Current Meds  Medication Sig  . aspirin EC 81  MG EC tablet Take 1 tablet (81 mg total) by mouth daily. Swallow whole.  . B-12, Methylcobalamin, 1000 MCG SUBL Place 1 tablet under the tongue daily.  . furosemide (LASIX) 40 MG tablet Take 40 mg daily for weight gain of 3 lbs overnight or 5 lbs in 1 week. (Patient taking differently: 40 mg. Take 40 mg daily for weight gain of 3 lbs overnight or 5 lbs in 1 week.)  . Multiple Vitamin (MULTIVITAMIN) capsule Take 1 capsule by mouth daily. Unknown strength  . nitroGLYCERIN (NITROSTAT) 0.4 MG SL tablet Place 1 tablet (0.4 mg total) under the tongue every 5 (five) minutes x 3 doses as needed for chest pain.  . Probiotic Product (PROBIOTIC PO) Take 1 tablet by mouth daily. Unknown strength  . psyllium (METAMUCIL) 58.6 % packet Take 1 packet by mouth daily. Unknown strength     Allergies:   Ciprofloxacin, Crestor [rosuvastatin calcium], and Nexletol [bempedoic acid]   Social History   Socioeconomic History  . Marital status: Married    Spouse name: Not on file  . Number of children: Not on file  . Years of education: Not on file  . Highest education level: Not on file  Occupational History  . Occupation: Engineer, drilling  Tobacco Use  . Smoking status: Former Smoker    Types: Cigarettes  . Smokeless tobacco: Never Used  Vaping Use  . Vaping Use: Never used  Substance and Sexual Activity  . Alcohol use: Yes    Comment: Wine with dinner  . Drug use: Never  . Sexual activity: Not on file  Other Topics Concern  . Not on file  Social History Narrative  . Not on file   Social Determinants of Health   Financial Resource Strain: Not on file  Food Insecurity: Not on file  Transportation Needs: Not on file  Physical Activity: Not on file  Stress: Not on file  Social Connections: Not on file     Family History: The patient's family history includes CAD in his brother, father, and mother; Healthy in his brother; Hypertension in his mother; Leukemia in his father; Osteoporosis in  his mother; Ovarian cancer in his sister; Rheumatic fever in his brother. ROS:   Please see the history of present illness.    All 14 point review of systems negative except as described per history of present illness  EKGs/Labs/Other Studies Reviewed:      Recent Labs: 05/27/2020: ALT 26; NT-Pro BNP 1,028 05/28/2020: Hemoglobin 13.3; Hemoglobin 13.6; Platelets 257 05/29/2020: BUN 13; Creatinine, Ser 1.10; Potassium 3.5; Sodium 137  Recent Lipid Panel    Component Value Date/Time   CHOL 196 05/29/2020 0100   TRIG 92 05/29/2020 0100   HDL 71 05/29/2020 0100   CHOLHDL 2.8 05/29/2020 0100   VLDL 18 05/29/2020 0100   LDLCALC 107 (H) 05/29/2020 0100    Physical Exam:    VS:  BP (!) 144/62 (BP Location: Left Arm, Patient Position: Sitting)   Pulse 77   Ht 5\' 11"  (1.803 m)   Wt 156 lb (70.8 kg)   SpO2 97%  BMI 21.76 kg/m     Wt Readings from Last 3 Encounters:  05/31/20 156 lb (70.8 kg)  05/29/20 151 lb 6.4 oz (68.7 kg)  05/27/20 165 lb (74.8 kg)     GEN:  Well nourished, well developed in no acute distress HEENT: Normal NECK: No JVD; No carotid bruits LYMPHATICS: No lymphadenopathy CARDIAC: RRR, holosystolic murmur best heard left upper portion of the sternum grade 2/6 to 3/6, no rubs, no gallops RESPIRATORY:  Clear to auscultation without rales, wheezing or rhonchi  ABDOMEN: Soft, non-tender, non-distended MUSCULOSKELETAL:  No edema; No deformity  SKIN: Warm and dry LOWER EXTREMITIES: no swelling NEUROLOGIC:  Alert and oriented x 3 PSYCHIATRIC:  Normal affect   ASSESSMENT:    1. Coronary artery disease involving native coronary artery of native heart without angina pectoris   2. Dilated cardiomyopathy ([place])   3. Nonrheumatic mitral valve regurgitation   4. Peripheral vascular disease ([place])   5. Status post biventricular pacemaker   6. Dyslipidemia    PLAN:    In order of problems listed above:  1. Coronary artery disease with RCA LAD and diagonal branch  disease with cardiomyopathy diminished ejection fraction of 40% and moderate to severe mitral regurgitation.  We spent a great of time talking today about options for the situation.  We elected to pursue transesophageal echocardiogram to assess significance of mitral regurgitation more importantly function and structure of the valve, after that we will schedule him to see his surgeon to talk about potentially doing coronary bypass graft surgery. 2. Peripheral vascular disease last carotid ultrasound done in January showing 40 to 59% on the right internal carotic artery, 40 to 59% on the left internal carotic artery.  He is on antiplatelet therapy in form of aspirin. 3. Dyslipidemia he is intolerant to multiple statin including PCSK9 agent we tried all possible statin I will give him prescription for Zetia 10 mg daily because it looks like that is the medication we have not tried yet.  He has been followed by our lipid clinic. 4. Cardiomyopathy with ejection fraction 40%, with this degree of mitral regurgitation is much worse.  We will do TEE trying to determine etiology of his mitral regurgitation.  He is intolerant to ACE inhibitor, will try ramipril he is intolerant to losartan, he is intolerant to Entresto.  He is also intolerant to beta-blocker.  We will have very few options left however we had a long discussion with him today as well as his wife and we concluded that he may need to adjust to some of the side effects of those medications to help with that his ejection fraction will improve.   Medication Adjustments/Labs and Tests Ordered: Current medicines are reviewed at length with the patient today.  Concerns regarding medicines are outlined above.  No orders of the defined types were placed in this encounter.  Medication changes: No orders of the defined types were placed in this encounter.   Signed, Park Liter, MD, [facility] 05/31/2020 4:55 PM    [place]

## 2020-06-02 ENCOUNTER — Other Ambulatory Visit (HOSPITAL_COMMUNITY)
Admission: RE | Admit: 2020-06-02 | Discharge: 2020-06-02 | Disposition: A | Discharge status: Home / Self Care | Payer: Medicare Other | Source: Ambulatory Visit | Attending: Internal Medicine | Admitting: Internal Medicine

## 2020-06-02 ENCOUNTER — Telehealth: Payer: Self-pay | Admitting: Emergency Medicine

## 2020-06-02 DIAGNOSIS — I442 Atrioventricular block, complete: Secondary | ICD-10-CM | POA: Diagnosis not present

## 2020-06-02 DIAGNOSIS — I420 Dilated cardiomyopathy: Secondary | ICD-10-CM | POA: Diagnosis not present

## 2020-06-02 DIAGNOSIS — I517 Cardiomegaly: Secondary | ICD-10-CM | POA: Diagnosis not present

## 2020-06-02 DIAGNOSIS — Z01812 Encounter for preprocedural laboratory examination: Secondary | ICD-10-CM | POA: Insufficient documentation

## 2020-06-02 DIAGNOSIS — I214 Non-ST elevation (NSTEMI) myocardial infarction: Secondary | ICD-10-CM | POA: Diagnosis not present

## 2020-06-02 DIAGNOSIS — Z20822 Contact with and (suspected) exposure to covid-19: Secondary | ICD-10-CM | POA: Insufficient documentation

## 2020-06-02 DIAGNOSIS — R079 Chest pain, unspecified: Secondary | ICD-10-CM | POA: Diagnosis not present

## 2020-06-02 DIAGNOSIS — J90 Pleural effusion, not elsewhere classified: Secondary | ICD-10-CM | POA: Diagnosis not present

## 2020-06-02 DIAGNOSIS — I5043 Acute on chronic combined systolic (congestive) and diastolic (congestive) heart failure: Secondary | ICD-10-CM | POA: Diagnosis not present

## 2020-06-02 DIAGNOSIS — I208 Other forms of angina pectoris: Secondary | ICD-10-CM | POA: Diagnosis not present

## 2020-06-02 DIAGNOSIS — N170 Acute kidney failure with tubular necrosis: Secondary | ICD-10-CM | POA: Diagnosis not present

## 2020-06-02 LAB — SARS CORONAVIRUS 2 (TAT 6-24 HRS): SARS Coronavirus 2: NEGATIVE

## 2020-06-02 NOTE — Telephone Encounter (Signed)
Left message for patient to return call. Dr. Agustin Cree would like me to confirm that someone has been in touch with him about tee procedure and protocols.

## 2020-06-02 NOTE — Telephone Encounter (Signed)
Patient called back. He states he was aware of TEE tomorrow and that he needed a covid test today due to calling [facility] himself.   I went over normal instructions and protocol before TEE as documented below. Patient plans to have covid test today. No further questions   DIET: Nothing to eat or drink after midnight except a sip of water with medications (see medication instructions below)  Medication Instructions: Hold Lasix   You must have a responsible person to drive you home and stay in the waiting area during your procedure. Failure to do so could result in cancellation.  Bring your insurance cards.  *Special Note: Every effort is made to have your procedure done on time. Occasionally there are emergencies that occur at the hospital that may cause delays. Please be patient if a delay does occur.

## 2020-06-03 ENCOUNTER — Other Ambulatory Visit (HOSPITAL_COMMUNITY): Payer: Self-pay | Admitting: Internal Medicine

## 2020-06-03 ENCOUNTER — Ambulatory Visit (HOSPITAL_BASED_OUTPATIENT_CLINIC_OR_DEPARTMENT_OTHER): Payer: Medicare Other

## 2020-06-03 ENCOUNTER — Encounter (HOSPITAL_COMMUNITY): Payer: Self-pay | Admitting: Internal Medicine

## 2020-06-03 ENCOUNTER — Ambulatory Visit (HOSPITAL_COMMUNITY)
Admission: RE | Admit: 2020-06-03 | Discharge: 2020-06-03 | Disposition: A | Discharge status: Home / Self Care | Payer: Medicare Other | Source: Home / Self Care | Attending: Internal Medicine | Admitting: Internal Medicine

## 2020-06-03 ENCOUNTER — Encounter (HOSPITAL_COMMUNITY)
Admission: RE | Disposition: A | Discharge status: Home / Self Care | Payer: Self-pay | Source: Home / Self Care | Attending: Internal Medicine

## 2020-06-03 ENCOUNTER — Ambulatory Visit (HOSPITAL_COMMUNITY): Payer: Medicare Other | Admitting: Certified Registered"

## 2020-06-03 DIAGNOSIS — I340 Nonrheumatic mitral (valve) insufficiency: Secondary | ICD-10-CM | POA: Insufficient documentation

## 2020-06-03 DIAGNOSIS — Z881 Allergy status to other antibiotic agents status: Secondary | ICD-10-CM | POA: Insufficient documentation

## 2020-06-03 DIAGNOSIS — N170 Acute kidney failure with tubular necrosis: Secondary | ICD-10-CM | POA: Diagnosis not present

## 2020-06-03 DIAGNOSIS — Z950 Presence of cardiac pacemaker: Secondary | ICD-10-CM | POA: Insufficient documentation

## 2020-06-03 DIAGNOSIS — Z87891 Personal history of nicotine dependence: Secondary | ICD-10-CM | POA: Insufficient documentation

## 2020-06-03 DIAGNOSIS — E785 Hyperlipidemia, unspecified: Secondary | ICD-10-CM | POA: Insufficient documentation

## 2020-06-03 DIAGNOSIS — I420 Dilated cardiomyopathy: Secondary | ICD-10-CM | POA: Insufficient documentation

## 2020-06-03 DIAGNOSIS — Z9852 Vasectomy status: Secondary | ICD-10-CM | POA: Insufficient documentation

## 2020-06-03 DIAGNOSIS — I739 Peripheral vascular disease, unspecified: Secondary | ICD-10-CM | POA: Insufficient documentation

## 2020-06-03 DIAGNOSIS — Z79899 Other long term (current) drug therapy: Secondary | ICD-10-CM | POA: Insufficient documentation

## 2020-06-03 DIAGNOSIS — Z20822 Contact with and (suspected) exposure to covid-19: Secondary | ICD-10-CM | POA: Diagnosis not present

## 2020-06-03 DIAGNOSIS — E871 Hypo-osmolality and hyponatremia: Secondary | ICD-10-CM | POA: Diagnosis not present

## 2020-06-03 DIAGNOSIS — I214 Non-ST elevation (NSTEMI) myocardial infarction: Secondary | ICD-10-CM | POA: Diagnosis not present

## 2020-06-03 DIAGNOSIS — I5023 Acute on chronic systolic (congestive) heart failure: Secondary | ICD-10-CM | POA: Diagnosis not present

## 2020-06-03 DIAGNOSIS — Z8249 Family history of ischemic heart disease and other diseases of the circulatory system: Secondary | ICD-10-CM | POA: Insufficient documentation

## 2020-06-03 DIAGNOSIS — E870 Hyperosmolality and hypernatremia: Secondary | ICD-10-CM | POA: Diagnosis not present

## 2020-06-03 DIAGNOSIS — Z888 Allergy status to other drugs, medicaments and biological substances status: Secondary | ICD-10-CM | POA: Insufficient documentation

## 2020-06-03 DIAGNOSIS — I4819 Other persistent atrial fibrillation: Secondary | ICD-10-CM | POA: Diagnosis not present

## 2020-06-03 DIAGNOSIS — I2510 Atherosclerotic heart disease of native coronary artery without angina pectoris: Secondary | ICD-10-CM | POA: Insufficient documentation

## 2020-06-03 DIAGNOSIS — D62 Acute posthemorrhagic anemia: Secondary | ICD-10-CM | POA: Diagnosis not present

## 2020-06-03 DIAGNOSIS — I4892 Unspecified atrial flutter: Secondary | ICD-10-CM | POA: Diagnosis not present

## 2020-06-03 DIAGNOSIS — I442 Atrioventricular block, complete: Secondary | ICD-10-CM | POA: Diagnosis not present

## 2020-06-03 DIAGNOSIS — Z7982 Long term (current) use of aspirin: Secondary | ICD-10-CM | POA: Insufficient documentation

## 2020-06-03 DIAGNOSIS — I509 Heart failure, unspecified: Secondary | ICD-10-CM

## 2020-06-03 DIAGNOSIS — I5043 Acute on chronic combined systolic (congestive) and diastolic (congestive) heart failure: Secondary | ICD-10-CM | POA: Diagnosis not present

## 2020-06-03 DIAGNOSIS — Q245 Malformation of coronary vessels: Secondary | ICD-10-CM | POA: Diagnosis not present

## 2020-06-03 HISTORY — PX: BUBBLE STUDY: SHX6837

## 2020-06-03 HISTORY — PX: TEE WITHOUT CARDIOVERSION: SHX5443

## 2020-06-03 SURGERY — ECHOCARDIOGRAM, TRANSESOPHAGEAL
Anesthesia: Monitor Anesthesia Care

## 2020-06-03 MED ORDER — PROPOFOL 500 MG/50ML IV EMUL
INTRAVENOUS | Status: DC | PRN
  Administered 2020-06-03: 200 ug/kg/min via INTRAVENOUS

## 2020-06-03 MED ORDER — BUTAMBEN-TETRACAINE-BENZOCAINE 2-2-14 % EX AERO
INHALATION_SPRAY | CUTANEOUS | Status: DC | PRN
  Administered 2020-06-03: 2 via TOPICAL

## 2020-06-03 MED ORDER — PROPOFOL 10 MG/ML IV BOLUS
INTRAVENOUS | Status: DC | PRN
  Administered 2020-06-03: 30 mg via INTRAVENOUS
  Administered 2020-06-03: 25 mg via INTRAVENOUS

## 2020-06-03 MED ORDER — LIDOCAINE 2% (20 MG/ML) 5 ML SYRINGE
INTRAMUSCULAR | Status: DC | PRN
  Administered 2020-06-03: 80 mg via INTRAVENOUS

## 2020-06-03 MED ORDER — SODIUM CHLORIDE 0.9 % IV SOLN: INTRAVENOUS | Status: DC | PRN

## 2020-06-03 NOTE — CV Procedure (Signed)
INDICATIONS: Severe MR  PROCEDURE:   Informed consent was obtained prior to the procedure. The risks, benefits and alternatives for the procedure were discussed and the patient comprehended these risks.  Risks include, but are not limited to, cough, sore throat, vomiting, nausea, somnolence, esophageal and stomach trauma or perforation, bleeding, low blood pressure, aspiration, pneumonia, infection, trauma to the teeth and death.    After a procedural time-out, the oropharynx was anesthetized with 20% benzocaine spray.   During this procedure the patient was administered propofol per anesthesia.  The patient's heart rate, blood pressure, and oxygen saturation were monitored continuously during the procedure. The period of conscious sedation was 30 minutes, of which I was present face-to-face 100% of this time.  The transesophageal probe was inserted in the esophagus and stomach without difficulty and multiple views were obtained.  The patient was kept under observation until the patient left the procedure room.  The patient left the procedure room in stable condition.   Agitated microbubble saline contrast was administered.  COMPLICATIONS:    There were no immediate complications.  FINDINGS:   FORMAL ECHOCARDIOGRAM REPORT PENDING EF 40% Severe mitral valve regurgitation which appears predominantly secondary and ischemic in etiology.  RECOMMENDATIONS:    Discharge home when alert.   Time Spent Directly with the Patient:  60 minutes   Cebastian Neis A Margaretann Loveless 06/03/2020, 1:15 PM

## 2020-06-03 NOTE — Discharge Instructions (Signed)
Transesophageal Echocardiogram Transesophageal echocardiogram (TEE) is a test that uses sound waves to take pictures of your heart. TEE is done by passing a small probe attached to a flexible tube down the part of the body that moves food from your mouth to your stomach (esophagus). The pictures give clear images of your heart. This can help your doctor see if there are problems with your heart. Tell a doctor about:  Any allergies you have.  All medicines you are taking. This includes vitamins, herbs, eye drops, creams, and over-the-counter medicines.  Any problems you or family members have had with anesthetic medicines.  Any blood disorders you have.  Any surgeries you have had.  Any medical conditions you have.  Any swallowing problems.  Whether you have or have had a blockage in the part of the body that moves food from your mouth to your stomach.  Whether you are pregnant or may be pregnant. What are the risks? In general, this is a safe procedure. But, problems may occur, such as:  Damage to nearby structures or organs.  A tear in the part of the body that moves food from your mouth to your stomach.  Irregular heartbeat.  Hoarse voice or trouble swallowing.  Bleeding. What happens before the procedure? Medicines  Ask your doctor about changing or stopping: ? Your normal medicines. ? Vitamins, herbs, and supplements. ? Over-the-counter medicines.  Do not take aspirin or ibuprofen unless you are told to. General instructions  Follow instructions from your doctor about what you cannot eat or drink.  You will take out any dentures or dental retainers.  Plan to have a responsible adult take you home from the hospital or clinic.  Plan to have a responsible adult care for you for the time you are told after you leave the hospital or clinic. This is important. What happens during the procedure?  An IV will be put into one of your veins.  You may be given: ? A  sedative. This medicine helps you relax. ? A medicine to numb the back of your throat. This may be sprayed or gargled.  Your blood pressure, heart rate, and breathing will be watched.  You may be asked to lie on your left side.  A bite block will be placed in your mouth. This keeps you from biting the tube.  The tip of the probe will be placed into the back of your mouth.  You will be asked to swallow.  Your doctor will take pictures of your heart.  The probe and bite block will be taken out after the test is done. The procedure may vary among doctors and hospitals.   What can I expect after the procedure?  You will be monitored until you leave the hospital or clinic. This includes checking your blood pressure, heart rate, breathing rate, and blood oxygen level.  Your throat may feel sore and numb. This will get better over time. You will not be allowed to eat or drink until the numbness has gone away.  It is common to have a sore throat for a day or two.  It is up to you to get the results of your procedure. Ask how to get your results when they are ready. Follow these instructions at home:  If you were given a sedative during your procedure, do not drive or use machines until your doctor says that it is safe.  Return to your normal activities when your doctor says that it is safe.    Keep all follow-up visits. Summary  TEE is a test that uses sound waves to take pictures of your heart.  You will be given a medicine to help you relax.  Do not drive or use machines until your doctor says that it is safe. This information is not intended to replace advice given to you by your health care provider. Make sure you discuss any questions you have with your health care provider. Document Revised: 09/23/2019 Document Reviewed: 09/23/2019 Elsevier Patient Education  2021 Elsevier Inc.  

## 2020-06-03 NOTE — Progress Notes (Signed)
  Echocardiogram Echocardiogram Transesophageal has been performed.  Ian Mcgee 06/03/2020, 1:08 PM

## 2020-06-03 NOTE — Transfer of Care (Signed)
Immediate Anesthesia Transfer of Care Note  Patient: Ian Mcgee  Procedure(s) Performed: TRANSESOPHAGEAL ECHOCARDIOGRAM (TEE) (N/A ) BUBBLE STUDY  Patient Location: Endoscopy Unit  Anesthesia Type:General  Level of Consciousness: drowsy  Airway & Oxygen Therapy: Patient Spontanous Breathing and Patient connected to nasal cannula oxygen  Post-op Assessment: Report given to RN and Post -op Vital signs reviewed and stable  Post vital signs: Reviewed and stable  Last Vitals:  Vitals Value Taken Time  BP 134/62 06/03/20 1255  Temp    Pulse 74 06/03/20 1256  Resp 18 06/03/20 1256  SpO2 97 % 06/03/20 1256  Vitals shown include unvalidated device data.  Last Pain:  Vitals:   06/03/20 1139  TempSrc: Temporal  PainSc: 0-No pain         Complications: No complications documented.

## 2020-06-03 NOTE — Interval H&P Note (Signed)
History and Physical Interval Note:  06/03/2020 12:09 PM  Ian Mcgee  has presented today for surgery, with the diagnosis of mitral regurg.  The various methods of treatment have been discussed with the patient and family. After consideration of risks, benefits and other options for treatment, the patient has consented to  Procedure(s): TRANSESOPHAGEAL ECHOCARDIOGRAM (TEE) (N/A) as a surgical intervention.  The patient's history has been reviewed, patient examined, no change in status, stable for surgery.  I have reviewed the patient's chart and labs.  Questions were answered to the patient's satisfaction.     [name]

## 2020-06-03 NOTE — Anesthesia Preprocedure Evaluation (Addendum)
Anesthesia Evaluation  Patient identified by MRN, date of birth, ID band Patient awake    Reviewed: Allergy & Precautions, H&P , NPO status , Patient's Chart, lab work & pertinent test results, reviewed documented beta blocker date and time   Airway Mallampati: II  TM Distance: >3 FB Neck ROM: full    Dental no notable dental hx. (+) Teeth Intact, Caps, Implants, Dental Advisory Given   Pulmonary former smoker,    Pulmonary exam normal breath sounds clear to auscultation       Cardiovascular Exercise Tolerance: Good + angina + CAD and + Peripheral Vascular Disease  + dysrhythmias  Rhythm:regular Rate:Normal  ECHO 4/22 Left Ventricle: Left ventricular ejection fraction by 3D volume is 40 %.  The left ventricle has moderately decreased function. The left ventricle  demonstrates regional wall motion abnormalities. Definity contrast agent  was given IV to delineate the left  ventricular endocardial borders. The left ventricular internal cavity  size was mildly dilated. There is mild asymmetric left ventricular  hypertrophy of the posterior-lateral segment. Left ventricular diastolic  parameters are consistent with Grade II  diastolic dysfunction (pseudonormalization).   Neuro/Psych PSYCHIATRIC DISORDERS    GI/Hepatic   Endo/Other    Renal/GU      Musculoskeletal   Abdominal   Peds  Hematology   Anesthesia Other Findings   Reproductive/Obstetrics                           Anesthesia Physical Anesthesia Plan  ASA: III  Anesthesia Plan: MAC   Post-op Pain Management:    Induction:   PONV Risk Score and Plan: 1  Airway Management Planned: Nasal Cannula, Natural Airway and Simple Face Mask  Additional Equipment: None  Intra-op Plan:   Post-operative Plan:   Informed Consent: I have reviewed the patients History and Physical, chart, labs and discussed the procedure including the  risks, benefits and alternatives for the proposed anesthesia with the patient or authorized representative who has indicated his/her understanding and acceptance.     Dental Advisory Given  Plan Discussed with: CRNA and Anesthesiologist  Anesthesia Plan Comments:        Anesthesia Quick Evaluation

## 2020-06-03 NOTE — Anesthesia Postprocedure Evaluation (Signed)
Anesthesia Post Note  Patient: Ian Mcgee  Procedure(s) Performed: TRANSESOPHAGEAL ECHOCARDIOGRAM (TEE) (N/A ) BUBBLE STUDY     Patient location during evaluation: PACU Anesthesia Type: MAC Level of consciousness: awake and alert Pain management: pain level controlled Vital Signs Assessment: post-procedure vital signs reviewed and stable Respiratory status: spontaneous breathing, nonlabored ventilation and respiratory function stable Cardiovascular status: stable and blood pressure returned to baseline Postop Assessment: no apparent nausea or vomiting Anesthetic complications: no   No complications documented.  Last Vitals:  Vitals:   06/03/20 1255 06/03/20 1304  BP: 134/62 137/64  Pulse: 74 74  Resp: 14 13  Temp: (!) 36.4 C   SpO2: 97% 95%    Last Pain:  Vitals:   06/03/20 1304  TempSrc:   PainSc: 0-No pain                 Ovida Delagarza A.

## 2020-06-03 NOTE — Anesthesia Procedure Notes (Signed)
Procedure Name: MAC Date/Time: 06/03/2020 12:22 PM Performed by: Imagene Riches, CRNA Pre-anesthesia Checklist: Patient identified, Emergency Drugs available, Suction available, Patient being monitored and Timeout performed Patient Re-evaluated:Patient Re-evaluated prior to induction Oxygen Delivery Method: Nasal cannula

## 2020-06-04 ENCOUNTER — Encounter: Payer: PRIVATE HEALTH INSURANCE | Admitting: Surgery

## 2020-06-05 ENCOUNTER — Inpatient Hospital Stay (HOSPITAL_COMMUNITY)
Admission: EM | Admit: 2020-06-05 | Discharge: 2020-06-17 | DRG: 219 | Disposition: A | Discharge status: Home / Self Care | Payer: Medicare Other | Attending: Cardiology | Admitting: Cardiology

## 2020-06-05 ENCOUNTER — Other Ambulatory Visit: Payer: Self-pay

## 2020-06-05 ENCOUNTER — Emergency Department (HOSPITAL_COMMUNITY): Payer: Medicare Other

## 2020-06-05 ENCOUNTER — Encounter (HOSPITAL_COMMUNITY): Payer: Self-pay | Admitting: Cardiology

## 2020-06-05 DIAGNOSIS — I208 Other forms of angina pectoris: Secondary | ICD-10-CM | POA: Diagnosis present

## 2020-06-05 DIAGNOSIS — H539 Unspecified visual disturbance: Secondary | ICD-10-CM | POA: Diagnosis not present

## 2020-06-05 DIAGNOSIS — K045 Chronic apical periodontitis: Secondary | ICD-10-CM | POA: Diagnosis not present

## 2020-06-05 DIAGNOSIS — I5023 Acute on chronic systolic (congestive) heart failure: Secondary | ICD-10-CM | POA: Diagnosis present

## 2020-06-05 DIAGNOSIS — Z01818 Encounter for other preprocedural examination: Secondary | ICD-10-CM

## 2020-06-05 DIAGNOSIS — D62 Acute posthemorrhagic anemia: Secondary | ICD-10-CM | POA: Diagnosis not present

## 2020-06-05 DIAGNOSIS — I25110 Atherosclerotic heart disease of native coronary artery with unstable angina pectoris: Secondary | ICD-10-CM | POA: Diagnosis present

## 2020-06-05 DIAGNOSIS — Z79899 Other long term (current) drug therapy: Secondary | ICD-10-CM

## 2020-06-05 DIAGNOSIS — E870 Hyperosmolality and hypernatremia: Secondary | ICD-10-CM | POA: Diagnosis not present

## 2020-06-05 DIAGNOSIS — Q245 Malformation of coronary vessels: Secondary | ICD-10-CM

## 2020-06-05 DIAGNOSIS — I4892 Unspecified atrial flutter: Secondary | ICD-10-CM | POA: Diagnosis not present

## 2020-06-05 DIAGNOSIS — K5900 Constipation, unspecified: Secondary | ICD-10-CM | POA: Diagnosis not present

## 2020-06-05 DIAGNOSIS — I509 Heart failure, unspecified: Secondary | ICD-10-CM

## 2020-06-05 DIAGNOSIS — Z881 Allergy status to other antibiotic agents status: Secondary | ICD-10-CM

## 2020-06-05 DIAGNOSIS — K06020 Generalized gingival recession, unspecified: Secondary | ICD-10-CM | POA: Diagnosis present

## 2020-06-05 DIAGNOSIS — R079 Chest pain, unspecified: Secondary | ICD-10-CM | POA: Diagnosis present

## 2020-06-05 DIAGNOSIS — I214 Non-ST elevation (NSTEMI) myocardial infarction: Secondary | ICD-10-CM | POA: Diagnosis present

## 2020-06-05 DIAGNOSIS — I959 Hypotension, unspecified: Secondary | ICD-10-CM | POA: Diagnosis not present

## 2020-06-05 DIAGNOSIS — Z8249 Family history of ischemic heart disease and other diseases of the circulatory system: Secondary | ICD-10-CM

## 2020-06-05 DIAGNOSIS — I200 Unstable angina: Secondary | ICD-10-CM | POA: Diagnosis not present

## 2020-06-05 DIAGNOSIS — I4891 Unspecified atrial fibrillation: Secondary | ICD-10-CM | POA: Diagnosis not present

## 2020-06-05 DIAGNOSIS — I442 Atrioventricular block, complete: Secondary | ICD-10-CM | POA: Diagnosis present

## 2020-06-05 DIAGNOSIS — K0510 Chronic gingivitis, plaque induced: Secondary | ICD-10-CM | POA: Diagnosis present

## 2020-06-05 DIAGNOSIS — Z87891 Personal history of nicotine dependence: Secondary | ICD-10-CM

## 2020-06-05 DIAGNOSIS — I5043 Acute on chronic combined systolic (congestive) and diastolic (congestive) heart failure: Secondary | ICD-10-CM | POA: Diagnosis not present

## 2020-06-05 DIAGNOSIS — I10 Essential (primary) hypertension: Secondary | ICD-10-CM | POA: Diagnosis present

## 2020-06-05 DIAGNOSIS — I420 Dilated cardiomyopathy: Secondary | ICD-10-CM | POA: Diagnosis present

## 2020-06-05 DIAGNOSIS — I4819 Other persistent atrial fibrillation: Secondary | ICD-10-CM | POA: Diagnosis present

## 2020-06-05 DIAGNOSIS — Z954 Presence of other heart-valve replacement: Secondary | ICD-10-CM | POA: Diagnosis not present

## 2020-06-05 DIAGNOSIS — E871 Hypo-osmolality and hyponatremia: Secondary | ICD-10-CM | POA: Diagnosis not present

## 2020-06-05 DIAGNOSIS — K089 Disorder of teeth and supporting structures, unspecified: Secondary | ICD-10-CM

## 2020-06-05 DIAGNOSIS — N179 Acute kidney failure, unspecified: Secondary | ICD-10-CM | POA: Diagnosis not present

## 2020-06-05 DIAGNOSIS — I083 Combined rheumatic disorders of mitral, aortic and tricuspid valves: Secondary | ICD-10-CM | POA: Diagnosis not present

## 2020-06-05 DIAGNOSIS — I2089 Other forms of angina pectoris: Secondary | ICD-10-CM | POA: Diagnosis present

## 2020-06-05 DIAGNOSIS — Z8679 Personal history of other diseases of the circulatory system: Secondary | ICD-10-CM

## 2020-06-05 DIAGNOSIS — I080 Rheumatic disorders of both mitral and aortic valves: Secondary | ICD-10-CM | POA: Diagnosis present

## 2020-06-05 DIAGNOSIS — F419 Anxiety disorder, unspecified: Secondary | ICD-10-CM | POA: Diagnosis present

## 2020-06-05 DIAGNOSIS — Z950 Presence of cardiac pacemaker: Secondary | ICD-10-CM | POA: Diagnosis present

## 2020-06-05 DIAGNOSIS — Z9889 Other specified postprocedural states: Secondary | ICD-10-CM | POA: Diagnosis not present

## 2020-06-05 DIAGNOSIS — I2510 Atherosclerotic heart disease of native coronary artery without angina pectoris: Secondary | ICD-10-CM | POA: Diagnosis present

## 2020-06-05 DIAGNOSIS — Z7901 Long term (current) use of anticoagulants: Secondary | ICD-10-CM | POA: Diagnosis not present

## 2020-06-05 DIAGNOSIS — J449 Chronic obstructive pulmonary disease, unspecified: Secondary | ICD-10-CM | POA: Diagnosis not present

## 2020-06-05 DIAGNOSIS — I517 Cardiomegaly: Secondary | ICD-10-CM | POA: Diagnosis not present

## 2020-06-05 DIAGNOSIS — E785 Hyperlipidemia, unspecified: Secondary | ICD-10-CM | POA: Diagnosis present

## 2020-06-05 DIAGNOSIS — Z20822 Contact with and (suspected) exposure to covid-19: Secondary | ICD-10-CM | POA: Diagnosis not present

## 2020-06-05 DIAGNOSIS — K047 Periapical abscess without sinus: Secondary | ICD-10-CM | POA: Diagnosis not present

## 2020-06-05 DIAGNOSIS — R0902 Hypoxemia: Secondary | ICD-10-CM | POA: Diagnosis not present

## 2020-06-05 DIAGNOSIS — Z7982 Long term (current) use of aspirin: Secondary | ICD-10-CM

## 2020-06-05 DIAGNOSIS — R110 Nausea: Secondary | ICD-10-CM | POA: Diagnosis not present

## 2020-06-05 DIAGNOSIS — J9811 Atelectasis: Secondary | ICD-10-CM | POA: Diagnosis not present

## 2020-06-05 DIAGNOSIS — K0850 Unsatisfactory restoration of tooth, unspecified: Secondary | ICD-10-CM | POA: Diagnosis present

## 2020-06-05 DIAGNOSIS — I428 Other cardiomyopathies: Secondary | ICD-10-CM

## 2020-06-05 DIAGNOSIS — I429 Cardiomyopathy, unspecified: Secondary | ICD-10-CM

## 2020-06-05 DIAGNOSIS — I110 Hypertensive heart disease with heart failure: Secondary | ICD-10-CM | POA: Diagnosis present

## 2020-06-05 DIAGNOSIS — Z48812 Encounter for surgical aftercare following surgery on the circulatory system: Secondary | ICD-10-CM | POA: Diagnosis not present

## 2020-06-05 DIAGNOSIS — J90 Pleural effusion, not elsewhere classified: Secondary | ICD-10-CM

## 2020-06-05 DIAGNOSIS — Z85828 Personal history of other malignant neoplasm of skin: Secondary | ICD-10-CM

## 2020-06-05 DIAGNOSIS — Z951 Presence of aortocoronary bypass graft: Secondary | ICD-10-CM

## 2020-06-05 DIAGNOSIS — N170 Acute kidney failure with tubular necrosis: Secondary | ICD-10-CM | POA: Diagnosis not present

## 2020-06-05 DIAGNOSIS — I272 Pulmonary hypertension, unspecified: Secondary | ICD-10-CM | POA: Diagnosis present

## 2020-06-05 DIAGNOSIS — R931 Abnormal findings on diagnostic imaging of heart and coronary circulation: Secondary | ICD-10-CM | POA: Diagnosis not present

## 2020-06-05 DIAGNOSIS — I340 Nonrheumatic mitral (valve) insufficiency: Secondary | ICD-10-CM | POA: Diagnosis not present

## 2020-06-05 DIAGNOSIS — I255 Ischemic cardiomyopathy: Secondary | ICD-10-CM | POA: Diagnosis present

## 2020-06-05 DIAGNOSIS — I480 Paroxysmal atrial fibrillation: Secondary | ICD-10-CM | POA: Diagnosis not present

## 2020-06-05 DIAGNOSIS — R918 Other nonspecific abnormal finding of lung field: Secondary | ICD-10-CM | POA: Diagnosis not present

## 2020-06-05 DIAGNOSIS — I739 Peripheral vascular disease, unspecified: Secondary | ICD-10-CM | POA: Diagnosis present

## 2020-06-05 DIAGNOSIS — Z888 Allergy status to other drugs, medicaments and biological substances status: Secondary | ICD-10-CM

## 2020-06-05 DIAGNOSIS — K08109 Complete loss of teeth, unspecified cause, unspecified class: Secondary | ICD-10-CM | POA: Diagnosis present

## 2020-06-05 HISTORY — DX: Chest pain, unspecified: R07.9

## 2020-06-05 HISTORY — DX: Nonrheumatic mitral (valve) insufficiency: I34.0

## 2020-06-05 LAB — COMPREHENSIVE METABOLIC PANEL
ALT: 24 U/L (ref 0–44)
AST: 29 U/L (ref 15–41)
Albumin: 4.1 g/dL (ref 3.5–5.0)
Alkaline Phosphatase: 48 U/L (ref 38–126)
Anion gap: 9 (ref 5–15)
BUN: 19 mg/dL (ref 8–23)
CO2: 27 mmol/L (ref 22–32)
Calcium: 9.1 mg/dL (ref 8.9–10.3)
Chloride: 102 mmol/L (ref 98–111)
Creatinine, Ser: 0.93 mg/dL (ref 0.61–1.24)
GFR, Estimated: >60 mL/min (ref >60)
Glucose, Bld: 92 mg/dL (ref 70–99)
Potassium: 3.6 mmol/L (ref 3.5–5.1)
Sodium: 138 mmol/L (ref 135–145)
Total Bilirubin: 0.9 mg/dL (ref 0.3–1.2)
Total Protein: 6.6 g/dL (ref 6.5–8.1)

## 2020-06-05 LAB — CBC WITH DIFFERENTIAL/PLATELET
Abs Immature Granulocytes: 0.04 10*3/uL (ref 0.00–0.07)
Basophils Absolute: 0.1 10*3/uL (ref 0.0–0.1)
Basophils Relative: 1 %
Eosinophils Absolute: 0.2 10*3/uL (ref 0.0–0.5)
Eosinophils Relative: 2 %
HCT: 43.6 % (ref 39.0–52.0)
Hemoglobin: 14.7 g/dL (ref 13.0–17.0)
Immature Granulocytes: 1 %
Lymphocytes Relative: 27 %
Lymphs Abs: 2.2 10*3/uL (ref 0.7–4.0)
MCH: 32.3 pg (ref 26.0–34.0)
MCHC: 33.7 g/dL (ref 30.0–36.0)
MCV: 95.8 fL (ref 80.0–100.0)
Monocytes Absolute: 0.7 10*3/uL (ref 0.1–1.0)
Monocytes Relative: 8 %
Neutro Abs: 5.1 10*3/uL (ref 1.7–7.7)
Neutrophils Relative %: 61 %
Platelets: 264 10*3/uL (ref 150–400)
RBC: 4.55 MIL/uL (ref 4.22–5.81)
RDW: 12.9 % (ref 11.5–15.5)
WBC: 8.3 10*3/uL (ref 4.0–10.5)
nRBC: 0 % (ref 0.0–0.2)

## 2020-06-05 LAB — TROPONIN I (HIGH SENSITIVITY)
Troponin I (High Sensitivity): 27 ng/L — ABNORMAL HIGH (ref <18)
Troponin I (High Sensitivity): 31 ng/L — ABNORMAL HIGH (ref <18)

## 2020-06-05 LAB — BRAIN NATRIURETIC PEPTIDE: B Natriuretic Peptide: 320 pg/mL — ABNORMAL HIGH (ref 0.0–100.0)

## 2020-06-05 MED ORDER — HEPARIN BOLUS VIA INFUSION
4000.0000 [IU] | Freq: Once | INTRAVENOUS | Status: AC
  Administered 2020-06-05: 4000 [IU] via INTRAVENOUS
  Filled 2020-06-05: qty 4000

## 2020-06-05 MED ORDER — ADULT MULTIVITAMIN W/MINERALS CH
1.0000 | ORAL_TABLET | Freq: Every day | ORAL | Status: DC
  Administered 2020-06-06 – 2020-06-09 (×4): 1 via ORAL
  Filled 2020-06-05 (×4): qty 1

## 2020-06-05 MED ORDER — NITROGLYCERIN IN D5W 200-5 MCG/ML-% IV SOLN
2.0000 ug/min | INTRAVENOUS | Status: DC
  Administered 2020-06-05: 5 ug/min via INTRAVENOUS
  Filled 2020-06-05: qty 250

## 2020-06-05 MED ORDER — NITROGLYCERIN 0.4 MG SL SUBL: 0.4000 mg | SUBLINGUAL_TABLET | SUBLINGUAL | Status: DC | PRN

## 2020-06-05 MED ORDER — ONDANSETRON HCL 4 MG/2ML IJ SOLN: 4.0000 mg | Freq: Four times a day (QID) | INTRAMUSCULAR | Status: DC | PRN

## 2020-06-05 MED ORDER — HEPARIN (PORCINE) 25000 UT/250ML-% IV SOLN
1300.0000 [IU]/h | INTRAVENOUS | Status: DC
  Administered 2020-06-05: 850 [IU]/h via INTRAVENOUS
  Administered 2020-06-06: 1200 [IU]/h via INTRAVENOUS
  Filled 2020-06-05 (×2): qty 250

## 2020-06-05 MED ORDER — ACETAMINOPHEN 325 MG PO TABS
650.0000 mg | ORAL_TABLET | ORAL | Status: DC | PRN
  Filled 2020-06-05: qty 2

## 2020-06-05 MED ORDER — METOPROLOL TARTRATE 12.5 MG HALF TABLET
12.5000 mg | ORAL_TABLET | Freq: Two times a day (BID) | ORAL | Status: DC
  Administered 2020-06-05 – 2020-06-09 (×9): 12.5 mg via ORAL
  Filled 2020-06-05 (×9): qty 1

## 2020-06-05 MED ORDER — ATORVASTATIN CALCIUM 10 MG PO TABS: 20.0000 mg | ORAL_TABLET | Freq: Every day | ORAL | Status: DC

## 2020-06-05 MED ORDER — ASPIRIN EC 81 MG PO TBEC
81.0000 mg | DELAYED_RELEASE_TABLET | Freq: Every day | ORAL | Status: DC
  Administered 2020-06-06 – 2020-06-09 (×4): 81 mg via ORAL
  Filled 2020-06-05 (×4): qty 1

## 2020-06-05 MED ORDER — FUROSEMIDE 40 MG PO TABS
40.0000 mg | ORAL_TABLET | Freq: Every day | ORAL | Status: DC
  Administered 2020-06-05 – 2020-06-09 (×5): 40 mg via ORAL
  Filled 2020-06-05 (×5): qty 1

## 2020-06-05 NOTE — Progress Notes (Signed)
ANTICOAGULATION CONSULT NOTE - Initial Consult  Pharmacy Consult for heparin Indication: chest pain/ACS  Allergies  Allergen Reactions  . Ciprofloxacin Other (See Comments)    Bleeding (intolerance)  . Crestor [Rosuvastatin Calcium] Other (See Comments)    Muscle twinges  . Nexletol [Bempedoic Acid]     Muscle and joint pain    Patient Measurements: Height: 5\' 11"  (180.3 cm) Weight: 70.8 kg (156 lb 1.4 oz) IBW/kg (Calculated) : 75.3 Heparin Dosing Weight: TBW  Vital Signs: Temp: 98.6 F (37 C) (04/23 1937) Temp Source: Oral (04/23 1937) BP: 178/98 (04/23 1937) Pulse Rate: 87 (04/23 1937)  Labs: Recent Labs    06/05/20 1934  HGB 14.7  HCT 43.6  PLT 264  CREATININE 0.93  TROPONINIHS 27*    Estimated Creatinine Clearance: 69.8 mL/min (by C-G formula based on SCr of 0.93 mg/dL).   Medical History: Past Medical History:  Diagnosis Date  . Attention deficit disorder (ADD) 05/05/2019  . Cardiac pacemaker 05/10/2017  . Cardiomyopathy ([place]) 05/08/2017  . Cardiomyopathy ([place]) 05/08/2017  . Complete heart block ([place]) 06/10/2015  . Coronary artery disease 05/15/2017   40% left main, 50% LAD, 50% RCA recent cardiac catheterization from March 2019  . Dyslipidemia 05/08/2017  . Nonischemic cardiomyopathy ([place]) 06/25/2017  . Pacemaker reprogramming/check 06/10/2015  . Peripheral vascular disease ([place]) 05/08/2017   Noncritical bilateral carotid arterial disease  . Pneumonia   . Status post biventricular pacemaker 07/16/2017  . Syncope and collapse    Assessment: 43 YOM presenting with CP and SOB, hx of CAD, he is not on anticoagulation PTA.  CBC wnl  Goal of Therapy:  Heparin level 0.3-0.7 units/ml Monitor platelets by anticoagulation protocol: Yes   Plan:  Heparin 4000 units IV x 1, and gtt at 850 units/hr F/u 8 hour heparin level F/u cards eval/plan  Bertis Ruddy, PharmD Clinical Pharmacist ED Pharmacist Phone # [phone] 06/05/2020 9:27 PM

## 2020-06-05 NOTE — ED Triage Notes (Signed)
Emergency Medicine Provider Triage Evaluation Note  Ian Mcgee , a 75 y.o. male  was evaluated in triage.  Pt complains of chest pain.  Pt with extensive cardiac hx here with CP and SOB that started at 3pm  Took one nitro and sxs improved.  He contacted his cardiologist, who recommended ED evaluation.  Review of Systems  Positive: CP, SOB Negative: vomiting  Physical Exam  There were no vitals taken for this visit. Gen:   Awake, no distress   HEENT:  Atraumatic  Resp:  Normal effort  Cardiac:  Normal rate  Abd:   Nondistended, nontender  MSK:   Moves extremities without difficulty  Neuro:  Speech clear   Medical Decision Making  Medically screening exam initiated at 7:30 PM.  Appropriate orders placed.  Ian Mcgee was informed that the remainder of the evaluation will be completed by another provider, this initial triage assessment does not replace that evaluation, and the importance of remaining in the ED until their evaluation is complete.  Clinical Impression  Angina   Ian Reichert, MD 06/05/20 2111

## 2020-06-05 NOTE — ED Notes (Signed)
Admitting at bedside 

## 2020-06-05 NOTE — ED Triage Notes (Signed)
Chest pain with sob today  He sees a cardiologist  He took a sl nitro and the chest discmfort is gone now  Needs by-pass surgery

## 2020-06-05 NOTE — ED Provider Notes (Signed)
[facility] EMERGENCY DEPARTMENT Provider Note   CSN: [id number] Arrival date & time: 06/05/20  1918     History Chief Complaint  Patient presents with  . Chest Pain    ALEEM ELZA is a 75 y.o. male.  The history is provided by the patient and medical records.   BROOK MALL is a 75 y.o. male who presents to the Emergency Department complaining of patient with history of coronary artery disease with plan for two vessel bypass here for evaluation of recurrent chest pain and shortness of breath. Pain was located is central chest rated to his jaw. He took one Nitro at home and his pain resolved. He called his cardiologist who recommended evaluation in the emergency department. On ED presentation he is pain free.    Past Medical History:  Diagnosis Date  . Attention deficit disorder (ADD) 05/05/2019  . Cardiac pacemaker 05/10/2017  . Cardiomyopathy ([place]) 05/08/2017  . Cardiomyopathy ([place]) 05/08/2017  . Complete heart block ([place]) 06/10/2015  . Coronary artery disease 05/15/2017   40% left main, 50% LAD, 50% RCA recent cardiac catheterization from March 2019  . Dyslipidemia 05/08/2017  . Nonischemic cardiomyopathy ([place]) 06/25/2017  . Pacemaker reprogramming/check 06/10/2015  . Peripheral vascular disease ([place]) 05/08/2017   Noncritical bilateral carotid arterial disease  . Pneumonia   . Status post biventricular pacemaker 07/16/2017  . Syncope and collapse     Patient Active Problem List   Diagnosis Date Noted  . Chest pain 06/05/2020  . Mitral regurgitation 05/31/2020  . Angina at rest [facility]) 05/28/2020  . Acute on chronic systolic heart failure ([place])   . Pneumonia   . Syncope and collapse   . Hyperlipidemia   . Attention deficit disorder (ADD) 05/05/2019  . Status post biventricular pacemaker 07/16/2017  . Nonischemic cardiomyopathy ([place]) 06/25/2017  . Coronary artery disease 05/15/2017  . Cardiac pacemaker 05/10/2017  . Cardiomyopathy ([place]) 05/08/2017   . Dyslipidemia 05/08/2017  . Peripheral vascular disease ([place]) 05/08/2017  . Complete heart block ([place]) 06/10/2015  . Pacemaker reprogramming/check 06/10/2015    Past Surgical History:  Procedure Laterality Date  . Basel Cell Cancer    . BIV UPGRADE N/A 06/25/2017   Procedure: BIV UPGRADE;  Surgeon: Constance Haw, MD;  Location: [place] CV LAB;  Service: Cardiovascular;  Laterality: N/A;  . BUBBLE STUDY  06/03/2020   Procedure: BUBBLE STUDY;  Surgeon: [name], MD;  Location: [facility] ENDOSCOPY;  Service: Cardiology;;  . CATARACT EXTRACTION    . INSERT / REPLACE / REMOVE PACEMAKER    . INTRAVASCULAR PRESSURE WIRE/FFR STUDY N/A 05/28/2020   Procedure: INTRAVASCULAR PRESSURE WIRE/FFR STUDY;  Surgeon: [name], MD;  Location: [place] CV LAB;  Service: Cardiovascular;  Laterality: N/A;  . KNEE SURGERY Right    ACL Reconstruction  . LEFT HEART CATH AND CORONARY ANGIOGRAPHY N/A 05/10/2017   Procedure: LEFT HEART CATH AND CORONARY ANGIOGRAPHY;  Surgeon: [name], MD;  Location: [place] CV LAB;  Service: Cardiovascular;  Laterality: N/A;  . RIGHT/LEFT HEART CATH AND CORONARY ANGIOGRAPHY N/A 05/28/2020   Procedure: RIGHT/LEFT HEART CATH AND CORONARY ANGIOGRAPHY;  Surgeon: [name], MD;  Location: [place] CV LAB;  Service: Cardiovascular;  Laterality: N/A;  . TEE WITHOUT CARDIOVERSION N/A 06/03/2020   Procedure: TRANSESOPHAGEAL ECHOCARDIOGRAM (TEE);  Surgeon: [name], MD;  Location: [place];  Service: Cardiology;  Laterality: N/A;  . VASECTOMY         Family History  Problem Relation  Age of Onset  . CAD Mother   . Hypertension Mother   . Osteoporosis Mother   . CAD Father   . Leukemia Father   . Ovarian cancer Sister   . Healthy Brother   . CAD Brother   . Rheumatic fever Brother     Social History   Tobacco Use  . Smoking status: Former Smoker    Types: Cigarettes  . Smokeless tobacco: Never Used  Vaping Use  .  Vaping Use: Never used  Substance Use Topics  . Alcohol use: Yes    Comment: Wine with dinner  . Drug use: Never    Home Medications Prior to Admission medications   Medication Sig Start Date End Date Taking? Authorizing Provider  aspirin EC 81 MG EC tablet Take 1 tablet (81 mg total) by mouth daily. Swallow whole. 05/30/20   [facility], Tami Lin, PA  B-12, Methylcobalamin, 1000 MCG SUBL Place 1 tablet under the tongue daily. 12/26/19   [provider]  furosemide (LASIX) 40 MG tablet Take 40 mg daily for weight gain of 3 lbs overnight or 5 lbs in 1 week. Patient taking differently: 40 mg. Take 40 mg daily for weight gain of 3 lbs overnight or 5 lbs in 1 week. 05/29/20   Ledora Bottcher, PA  Multiple Vitamin (MULTIVITAMIN) capsule Take 1 capsule by mouth daily. Unknown strength    [provider]  nitroGLYCERIN (NITROSTAT) 0.4 MG SL tablet Place 1 tablet (0.4 mg total) under the tongue every 5 (five) minutes x 3 doses as needed for chest pain. 05/29/20   Ledora Bottcher, PA  Probiotic Product (PROBIOTIC PO) Take 1 tablet by mouth daily. Unknown strength    [provider]  psyllium (METAMUCIL) 58.6 % packet Take 1 packet by mouth daily. Unknown strength    [provider]    Allergies    Ciprofloxacin, Crestor [rosuvastatin calcium], and Nexletol [bempedoic acid]  Review of Systems   Review of Systems  All other systems reviewed and are negative.   Physical Exam Updated Vital Signs BP (!) 178/98 (BP Location: Right Arm)   Pulse 87   Temp 98.6 F (37 C) (Oral)   Resp 19   Ht 5\' 11"  (1.803 m)   Wt 70.8 kg   SpO2 98%   BMI 21.77 kg/m   Physical Exam Vitals and nursing note reviewed.  Constitutional:      Appearance: He is well-developed.  HENT:     Head: Normocephalic and atraumatic.  Cardiovascular:     Rate and Rhythm: Normal rate and regular rhythm.     Heart sounds: No murmur heard.   Pulmonary:     Effort: Pulmonary effort  is normal. No respiratory distress.     Breath sounds: Normal breath sounds.  Abdominal:     Palpations: Abdomen is soft.     Tenderness: There is no abdominal tenderness. There is no guarding or rebound.  Musculoskeletal:        General: No tenderness.  Skin:    General: Skin is warm and dry.  Neurological:     Mental Status: He is alert and oriented to person, place, and time.  Psychiatric:        Behavior: Behavior normal.     ED Results / Procedures / Treatments   Labs (all labs ordered are listed, but only abnormal results are displayed) Labs Reviewed  BRAIN NATRIURETIC PEPTIDE - Abnormal; Notable for the following components:      Result Value  B Natriuretic Peptide 320.0 (*)    All other components within normal limits  TROPONIN I (HIGH SENSITIVITY) - Abnormal; Notable for the following components:   Troponin I (High Sensitivity) 27 (*)    All other components within normal limits  SARS CORONAVIRUS 2 (TAT 6-24 HRS)  COMPREHENSIVE METABOLIC PANEL  CBC WITH DIFFERENTIAL/PLATELET  TROPONIN I (HIGH SENSITIVITY)    EKG EKG Interpretation  Date/Time:  Saturday June 05 2020 19:28:38 EDT Ventricular Rate:  94 PR Interval:  168 QRS Duration: 172 QT Interval:  430 QTC Calculation: 537 R Axis:   154 Text Interpretation: Atrial-sensed ventricular-paced rhythm Abnormal ECG Confirmed by Quintella Reichert [id number]) on 06/05/2020 7:32:59 PM   Radiology DG Chest 2 View  Result Date: 06/05/2020 CLINICAL DATA:  Chest pain EXAM: CHEST - 2 VIEW COMPARISON:  May 28, 2020 FINDINGS: Left chest pacemaker with leads over right atrium right ventricle and coronary sinus. Similar cardiomegaly. Similar prominent interstitial consistent with mild edema. Trace left pleural effusion. The visualized skeletal structures is unchanged. IMPRESSION: Similar cardiomegaly and mild interstitial edema. Electronically Signed   By: Dahlia Bailiff MD   On: 06/05/2020 20:15    Procedures Procedures    Medications Ordered in ED Medications - No data to display  ED Course  I have reviewed the triage vital signs and the nursing notes.  Pertinent labs & imaging results that were available during my care of the patient were reviewed by me and considered in my medical decision making (see chart for details).    MDM Rules/Calculators/A&P                         patient here for evaluation of chest pain, has known coronary artery disease. EKG with pace rhythm. He is pain free on ED presentation. Discussed with Dr. Rudi Rummage with cardiology, plan to admit for ongoing treatment. Final Clinical Impression(s) / ED Diagnoses Final diagnoses:  Angina of effort [facility])    Rx / DC Orders ED Discharge Orders    None       Quintella Reichert, MD 06/05/20 2111

## 2020-06-05 NOTE — H&P (Signed)
Cardiology Admission History and Physical:   Patient ID: Ian Mcgee MRN: [id number]; DOB: [birth year]   Admission date: 06/05/2020  PCP:  Ian Flow, MD   [place]  Cardiologist:  Ian Campus, MD  Advanced Practice Provider:  No care team member to display Electrophysiologist:  Ian Meredith Leeds, MD   Click here to update Patient Care Team and Refresh Note - MD (PCP) or APP (Team Member)  Change PCP Type for MD, Specialty for APP is either Cardiology or Clinical Cardiac Electrophysiology  :[id number]}    Chief Complaint:  Chest pain  Patient Profile:   Ian Mcgee is a 75 y.o. male with PMH CAD, EF 45% and h/o CHB s/p BiV, PAD/Carotid dx 60%, multiple medications intolerants, recently diagnosed with multivessel CAD (LM50%, ostial LAD50%, mod mid LAD 60%, iFR+, D2 dx 50%, mid 75%, calcific prox RCA 80% dx, anomalous LCX- small and heavily calcified), severe MR (carpentier type IIIb) on recent LHC/RHC 4/15/2022presents with c/o Chest pain and SOB/   History of Present Illness:   Ian Mcgee is a 75 y.o. male with PMH CAD, EF 45% and h/o CHB s/p BiV, PAD/Carotid dx 60%, multiple medications intolerants, recently diagnosed with multivessel CAD (LM50%, ostial LAD50%, mod mid LAD 60%, iFR+, D2 dx 50%, mid 75%, calcific prox RCA 80% dx, anomalous LCX- small and heavily calcified), severe MR (carpentier type IIIb) on recent LHC/RHC 05/28/2020 presents with c/o Chest pain and SOB.  He was recently admitted for work up:  05/28/2020- underwent L/RHC as noted below- multivessel CAD and severe functional MR- wanted to discuss with his outpatient cardiologist, Dr Ian Mcgee about CABG and MVR. He was seen in his clinic on 4/18 and underwent TEE on 4/21- showed severe functional MR.  Today he started having chest pain/pressure while working on the round table wood but was not doing aggressive work just standing and also noted sob- took SL nitro with  improvement in pressure and called Dr Ian Mcgee and was asked to go to the ER. While waiting in the ER- he had another epiosde of chest tightness and took 2nd SL nitro ER work up: CXR- neg for pulm edema, clear lungs, BP 178/98, HR 82. He was chest pain free when I saw him Trop 27-> 31 BNP 320  The patient was a former [employer]. At baseline is very active, play tennis, does yard work. Former smoker (quit several years ago), drinks 2/day, denies substance use. He has a long list of intolerance (listed as allergy to GDMT). His chest pain has been worse since past month but his activity has reduced since many months and also noticed jaw tightness since many months.  Past Medical History:  Diagnosis Date  . Attention deficit disorder (ADD) 05/05/2019  . Cardiac pacemaker 05/10/2017  . Cardiomyopathy ([place]) 05/08/2017  . Cardiomyopathy ([place]) 05/08/2017  . Complete heart block ([place]) 06/10/2015  . Coronary artery disease 05/15/2017   40% left main, 50% LAD, 50% RCA recent cardiac catheterization from March 2019  . Dyslipidemia 05/08/2017  . Nonischemic cardiomyopathy ([place]) 06/25/2017  . Pacemaker reprogramming/check 06/10/2015  . Peripheral vascular disease ([place]) 05/08/2017   Noncritical bilateral carotid arterial disease  . Pneumonia   . Status post biventricular pacemaker 07/16/2017  . Syncope and collapse     Past Surgical History:  Procedure Laterality Date  . Basel Cell Cancer    . BIV UPGRADE N/A 06/25/2017   Procedure: BIV UPGRADE;  Surgeon: Constance Haw, MD;  Location: [place] [facility];  Service: Cardiovascular;  Laterality: N/A;  . BUBBLE STUDY  06/03/2020   Procedure: BUBBLE STUDY;  Surgeon: [name], MD;  Location: [facility];  Service: Cardiology;;  . CATARACT EXTRACTION    . INSERT / REPLACE / REMOVE PACEMAKER    . INTRAVASCULAR PRESSURE WIRE/FFR STUDY N/A 05/28/2020   Procedure: INTRAVASCULAR PRESSURE WIRE/FFR STUDY;  Surgeon: [name],  MD;  Location: [place] [facility];  Service: Cardiovascular;  Laterality: N/A;  . KNEE SURGERY Right    ACL Reconstruction  . LEFT HEART CATH AND CORONARY ANGIOGRAPHY N/A 05/10/2017   Procedure: LEFT HEART CATH AND CORONARY ANGIOGRAPHY;  Surgeon: [name], MD;  Location: [place] [facility];  Service: Cardiovascular;  Laterality: N/A;  . RIGHT/LEFT HEART CATH AND CORONARY ANGIOGRAPHY N/A 05/28/2020   Procedure: RIGHT/LEFT HEART CATH AND CORONARY ANGIOGRAPHY;  Surgeon: [name], MD;  Location: [place] [facility];  Service: Cardiovascular;  Laterality: N/A;  . TEE WITHOUT CARDIOVERSION N/A 06/03/2020   Procedure: TRANSESOPHAGEAL ECHOCARDIOGRAM (TEE);  Surgeon: [name], MD;  Location: [place];  Service: Cardiology;  Laterality: N/A;  . VASECTOMY       Medications Prior to Admission: Prior to Admission medications   Medication Sig Start Date End Date Taking? Authorizing Provider  aspirin EC 81 MG EC tablet Take 1 tablet (81 mg total) by mouth daily. Swallow whole. 05/30/20   [facility], Tami Lin, PA  B-12, Methylcobalamin, 1000 MCG SUBL Place 1 tablet under the tongue daily. 12/26/19   [provider]  furosemide (LASIX) 40 MG tablet Take 40 mg daily for weight gain of 3 lbs overnight or 5 lbs in 1 week. Patient taking differently: 40 mg. Take 40 mg daily for weight gain of 3 lbs overnight or 5 lbs in 1 week. 05/29/20   Ledora Bottcher, PA  Multiple Vitamin (MULTIVITAMIN) capsule Take 1 capsule by mouth daily. Unknown strength    [provider]  nitroGLYCERIN (NITROSTAT) 0.4 MG SL tablet Place 1 tablet (0.4 mg total) under the tongue every 5 (five) minutes x 3 doses as needed for chest pain. 05/29/20   Ledora Bottcher, PA  Probiotic Product (PROBIOTIC PO) Take 1 tablet by mouth daily. Unknown strength    [provider]  psyllium (METAMUCIL) 58.6 % packet Take 1 packet by mouth daily. Unknown strength    [provider]      Allergies:    Allergies  Allergen Reactions  . Ciprofloxacin Other (See Comments)    Bleeding (intolerance)  . Crestor [Rosuvastatin Calcium] Other (See Comments)    Muscle twinges  . Nexletol [Bempedoic Acid]     Muscle and joint pain    Social History:   Social History   Socioeconomic History  . Marital status: Married    Spouse name: Not on file  . Number of children: Not on file  . Years of education: Not on file  . Highest education level: Not on file  Occupational History  . Occupation: Engineer, drilling  Tobacco Use  . Smoking status: Former Smoker    Types: Cigarettes  . Smokeless tobacco: Never Used  Vaping Use  . Vaping Use: Never used  Substance and Sexual Activity  . Alcohol use: Yes    Comment: Wine with dinner  . Drug use: Never  . Sexual activity: Not on file  Other Topics Concern  . Not on file  Social History Narrative  . Not on file   Social  Determinants of Health   Financial Resource Strain: Not on file  Food Insecurity: Not on file  Transportation Needs: Not on file  Physical Activity: Not on file  Stress: Not on file  Social Connections: Not on file  Intimate Partner Violence: Not on file    Family History:   The patient's family history includes CAD in his brother, father, and mother; Healthy in his brother; Hypertension in his mother; Leukemia in his father; Osteoporosis in his mother; Ovarian cancer in his sister; Rheumatic fever in his brother.    ROS:  Please see the history of present illness.  All other ROS reviewed and negative.     Physical Exam/Data:   Vitals:   06/05/20 1937 06/05/20 1937  BP:  (!) 178/98  Pulse:  87  Resp:  19  Temp:  98.6 F (37 C)  TempSrc:  Oral  SpO2:  98%  Weight: 70.8 kg   Height: 5\' 11"  (1.803 m)    No intake or output data in the 24 hours ending 06/05/20 2017 Last 3 Weights 06/05/2020 06/03/2020 05/31/2020  Weight (lbs) 156 lb 1.4 oz 156 lb 1.4 oz 156 lb  Weight (kg) 70.8 kg 70.8  kg 70.761 kg     Body mass index is 21.77 kg/m.  General:  Well nourished, well developed, in no acute distress HEENT: normal Lymph: no adenopathy Neck:  JVD elevated+ Endocrine:  No thryomegaly Vascular: No carotid bruits; FA pulses 2+ bilaterally without bruits  Cardiac:  normal S1, S2; RRR; ejection systolic murmur in the precordium+ Lungs:  clear to auscultation bilaterally, no wheezing, rhonchi or rales  Abd: soft, nontender, no hepatomegaly  Ext: trace edema Musculoskeletal:  No deformities, BUE and BLE strength normal and equal Skin: warm and dry  Neuro:  CNs 2-12 intact, no focal abnormalities noted Psych:  Normal affect     Laboratory Data:  High Sensitivity Troponin:  No results for input(s): TROPONINIHS in the last 720 hours.    ChemistryNo results for input(s): NA, K, CL, CO2, GLUCOSE, BUN, CREATININE, CALCIUM, GFRNONAA, GFRAA, ANIONGAP in the last 168 hours.  No results for input(s): PROT, ALBUMIN, AST, ALT, ALKPHOS, BILITOT in the last 168 hours. Hematology Recent Labs  Lab 06/05/20 1934  WBC 8.3  RBC 4.55  HGB 14.7  HCT 43.6  MCV 95.8  MCH 32.3  MCHC 33.7  RDW 12.9  PLT 264   BNPNo results for input(s): BNP, PROBNP in the last 168 hours.  DDimer No results for input(s): DDIMER in the last 168 hours.   Radiology/Studies:  No results found.   EKG:  Today: atrial sensed BiV paced rhtyhm, HR 91   Relevant CV Studies:  TEE:06/03/20 FORMAL ECHOCARDIOGRAM REPORT PENDING EF 40% Severe mitral valve regurgitation which appears predominantly secondary and ischemic in etiology.  ECHO: 05/29/20 1. At least moderate-severe mitral valve regurgitation. Mechanism appears  to be secondary Carpentier IIIb, with a posteriorly directed, eccentric  jet of MR due to restricted posterior leaflet and relative override of  anterior leaflet. Significant splay  artifact in 2 chamber view suggests MR may be severe, and large V wave  seen on right heart cath. No  definite flail segments seen. The mitral  valve is grossly normal. Moderate to severe mitral valve regurgitation. No  evidence of mitral stenosis.  2. Left ventricular ejection fraction by 3D volume is 40 %. The left  ventricle has moderately decreased function. The left ventricle  demonstrates regional wall motion abnormalities (see scoring  diagram/findings for  description). The left ventricular  internal cavity size was mildly dilated. There is mild asymmetric left  ventricular hypertrophy of the posterior-lateral segment. Left ventricular  diastolic parameters are consistent with Grade II diastolic dysfunction  (pseudonormalization).  3. Right ventricular systolic function is normal. The right ventricular  size is normal. There is normal pulmonary artery systolic pressure. The  estimated right ventricular systolic pressure is 28.4 mmHg.  4. Left atrial size was severely dilated.  5. Right atrial size was mildly dilated.  6. The aortic valve is grossly normal. There is mild calcification of the  aortic valve. Aortic valve regurgitation is not visualized. Mild aortic  valve sclerosis is present, with no evidence of aortic valve stenosis.  7. The inferior vena cava is normal in size with greater than 50%  respiratory variability, suggesting right atrial pressure of 3 mmHg.      LHC/RHC" 05/28/2020 1.  Severe calcific stenosis of the RCA with 80% stenosis in the proximal vessel 2.  Moderate, Mcgee obstructive stenosis at the ostium of the LAD and segmental disease in the mid LAD, significant Mcgee limitation with an RFR of 0.83 3.  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  Right heart catheterization demonstrating elevated wedge pressure, large V waves, and moderate pulmonary hypertension likely secondary to left heart disease  Recommendations: Would consider cardiac surgical revascularization as the patient has  hemodynamically significant multivessel coronary artery disease, LV dysfunction, and congestive heart failure.  His coronary artery disease is relatively stable and the patient expresses desire to discuss this with Dr. Agustin Mcgee as an outpatient.  During this hospitalization, would diurese him (written for IV Lasix 40 mg tonight followed by 20 mg IV twice daily starting tomorrow morning).  Outpatient follow-up with Dr. Agustin Mcgee to further discuss revascularization options.    RHC: Hemodynamic findings consistent with moderate pulmonary hypertension (PA 47/27, mean 39). Elevated LV EDP consistent with volume overload (LVEDP 24), RA 5, RV 48/4, PCWP 50mmhg, V waves 46.   Assessment and Plan:   1. Chest pain/NSTEMI (Multivessel CAD)  LHC: 05/28/20: multivessel CAD (LM50%, ostial LAD50%, mod mid LAD 60%, iFR+, D2 dx 50%, mid 75%, calcific prox RCA 80% dx, separate left cusp ostium for LCX- small vessel and heavily calcified), 2. Severe functional MR (Carpentier type 3b) 3. HFrEF: EF 40%, warm and euvolemic 4. CHB s/p biV PPM 5. Carotid dx (right ICA 40-59%, ECA >50%, left ICA 40-59%)  Plan:  - admit to Cardiology service  - patient is intolerant to multiple medications (bb, ace/arb, statins, pcsk-9)- varying problems (bB caused dizziness/syncope despite having BIV PPM, statins- ms twinges, ace/arbs- knee pains etc) - consult CTSx for CABG and MVR eval - GDMT: pt is agreeable to try statin and betablocker while in the hospital  - metoprolol tart 12.5mg  BID, atorva 40mg  daily  - continue lasix 40mg  daily (on exam euvolemic) - he also has h/o dental infection- need to see dentistry Lipids- LDL 107, TC 196 (atorva 40mg ) -  given his chest pain and HTNsive: put him on Nitro gtt 57mcg/min  - can transition to PO Imdur 30mg  trow. Full code  Risk Assessment/Risk Scores:     HEAR Score (for undifferentiated chest pain):     New York Heart Association (NYHA) Functional Class NYHA Class II      Severity of Illness: The appropriate patient status for this patient is INPATIENT. Inpatient status is judged to be reasonable and necessary in order to provide  the required intensity of service to ensure the patient's safety. The patient's presenting symptoms, physical exam findings, and initial radiographic and laboratory data in the context of their chronic comorbidities is felt to place them at high risk for further clinical deterioration. Furthermore, it is not anticipated that the patient Ian be medically stable for discharge from the hospital within 2 midnights of admission. The following factors support the patient status of inpatient.   " The patient's presenting symptoms include chest pain, CHF. " The worrisome physical exam findings include CHF, chest pain. " The initial radiographic and laboratory data are worrisome because of CHF. " The chronic co-morbidities include multivessel CAD, severe MR, PAD.   * I certify that at the point of admission it is my clinical judgment that the patient Ian require inpatient hospital care spanning beyond 2 midnights from the point of admission due to high intensity of service, high risk for further deterioration and high frequency of surveillance required.*    For questions or updates, please contact [place] Please consult [email or web address] for contact info under     Signed, Renae Fickle, MD  06/05/2020 8:17 PM

## 2020-06-06 ENCOUNTER — Other Ambulatory Visit: Payer: Self-pay

## 2020-06-06 DIAGNOSIS — I10 Essential (primary) hypertension: Secondary | ICD-10-CM | POA: Diagnosis present

## 2020-06-06 DIAGNOSIS — I200 Unstable angina: Secondary | ICD-10-CM

## 2020-06-06 DIAGNOSIS — I480 Paroxysmal atrial fibrillation: Secondary | ICD-10-CM

## 2020-06-06 DIAGNOSIS — I214 Non-ST elevation (NSTEMI) myocardial infarction: Secondary | ICD-10-CM | POA: Diagnosis present

## 2020-06-06 HISTORY — DX: Non-ST elevation (NSTEMI) myocardial infarction: I21.4

## 2020-06-06 LAB — CBC
HCT: 42.8 % (ref 39.0–52.0)
Hemoglobin: 14.5 g/dL (ref 13.0–17.0)
MCH: 31.9 pg (ref 26.0–34.0)
MCHC: 33.9 g/dL (ref 30.0–36.0)
MCV: 94.3 fL (ref 80.0–100.0)
Platelets: 229 10*3/uL (ref 150–400)
RBC: 4.54 MIL/uL (ref 4.22–5.81)
RDW: 12.8 % (ref 11.5–15.5)
WBC: 7.3 10*3/uL (ref 4.0–10.5)
nRBC: 0 % (ref 0.0–0.2)

## 2020-06-06 LAB — BASIC METABOLIC PANEL
Anion gap: 8 (ref 5–15)
BUN: 16 mg/dL (ref 8–23)
CO2: 26 mmol/L (ref 22–32)
Calcium: 8.9 mg/dL (ref 8.9–10.3)
Chloride: 105 mmol/L (ref 98–111)
Creatinine, Ser: 0.87 mg/dL (ref 0.61–1.24)
GFR, Estimated: >60 mL/min (ref >60)
Glucose, Bld: 141 mg/dL — ABNORMAL HIGH (ref 70–99)
Potassium: 3.3 mmol/L — ABNORMAL LOW (ref 3.5–5.1)
Sodium: 139 mmol/L (ref 135–145)

## 2020-06-06 LAB — SARS CORONAVIRUS 2 (TAT 6-24 HRS): SARS Coronavirus 2: NEGATIVE

## 2020-06-06 LAB — HEPARIN LEVEL (UNFRACTIONATED)
Heparin Unfractionated: 0.2 IU/mL — ABNORMAL LOW (ref 0.30–0.70)
Heparin Unfractionated: 0.23 IU/mL — ABNORMAL LOW (ref 0.30–0.70)
Heparin Unfractionated: <0.1 IU/mL — ABNORMAL LOW (ref 0.30–0.70)

## 2020-06-06 LAB — TROPONIN I (HIGH SENSITIVITY): Troponin I (High Sensitivity): 27 ng/L — ABNORMAL HIGH (ref <18)

## 2020-06-06 LAB — MRSA PCR SCREENING: MRSA by PCR: NEGATIVE

## 2020-06-06 MED ORDER — POTASSIUM CHLORIDE CRYS ER 20 MEQ PO TBCR
40.0000 meq | EXTENDED_RELEASE_TABLET | Freq: Once | ORAL | Status: AC
  Administered 2020-06-06: 40 meq via ORAL
  Filled 2020-06-06: qty 2

## 2020-06-06 MED ORDER — HEPARIN BOLUS VIA INFUSION
2000.0000 [IU] | Freq: Once | INTRAVENOUS | Status: AC
  Administered 2020-06-06: 2000 [IU] via INTRAVENOUS
  Filled 2020-06-06: qty 2000

## 2020-06-06 MED ORDER — HEPARIN BOLUS VIA INFUSION
1000.0000 [IU] | Freq: Once | INTRAVENOUS | Status: AC
  Administered 2020-06-06: 1000 [IU] via INTRAVENOUS
  Filled 2020-06-06: qty 1000

## 2020-06-06 MED ORDER — ATORVASTATIN CALCIUM 40 MG PO TABS
40.0000 mg | ORAL_TABLET | Freq: Every day | ORAL | Status: DC
  Administered 2020-06-06 – 2020-06-17 (×11): 40 mg via ORAL
  Filled 2020-06-06 (×2): qty 1
  Filled 2020-06-06: qty 4
  Filled 2020-06-06 (×8): qty 1

## 2020-06-06 NOTE — Progress Notes (Signed)
Progress Note  Patient Name: Ian Mcgee Date of Encounter: 06/06/2020  [facility] Cardiologist: Jenne Campus, MD   Subjective   No CP or dyspnea  Inpatient Medications    Scheduled Meds: . aspirin EC  81 mg Oral Daily  . atorvastatin  40 mg Oral Daily  . furosemide  40 mg Oral Daily  . metoprolol tartrate  12.5 mg Oral BID  . multivitamin with minerals  1 tablet Oral Daily   Continuous Infusions: . heparin 1,000 Units/hr (06/06/20 0231)  . nitroGLYCERIN 5 mcg/min (06/05/20 2345)   PRN Meds: acetaminophen, nitroGLYCERIN, ondansetron (ZOFRAN) IV   Vital Signs    Vitals:   06/05/20 2318 06/05/20 2319 06/06/20 0300 06/06/20 0700  BP: 132/83  124/79 (!) 131/94  Pulse: 70  70 68  Resp: 14  16 20   Temp: 98.1 F (36.7 C)  98.1 F (36.7 C) 98.3 F (36.8 C)  TempSrc: Oral  Oral Oral  SpO2: 97%  100% 94%  Weight:  71 kg    Height:  5\' 11"  (1.803 m)      Intake/Output Summary (Last 24 hours) at 06/06/2020 0948 Last data filed at 06/06/2020 0748 Gross per 24 hour  Intake 500 ml  Output 1375 ml  Net -875 ml   Last 3 Weights 06/05/2020 06/05/2020 06/03/2020  Weight (lbs) 156 lb 8.4 oz 156 lb 1.4 oz 156 lb 1.4 oz  Weight (kg) 71 kg 70.8 kg 70.8 kg      Telemetry    Ventricular pacing with intermittent underlying atrial flutter/fibrillation- Personally Reviewed  Physical Exam   GEN: No acute distress.   Neck: No JVD Cardiac: RRR, 2/6 systolic murmur Respiratory: Clear to auscultation bilaterally. GI: Soft, nontender, non-distended  MS: No edema Neuro:  Nonfocal  Psych: Normal affect   Labs    High Sensitivity Troponin:   Recent Labs  Lab 06/05/20 1934 06/05/20 2220 06/06/20 0608  TROPONINIHS 27* 31* 27*      Chemistry Recent Labs  Lab 06/05/20 1934 06/06/20 0030  NA 138 139  K 3.6 3.3*  CL 102 105  CO2 27 26  GLUCOSE 92 141*  BUN 19 16  CREATININE 0.93 0.87  CALCIUM 9.1 8.9  PROT 6.6  --   ALBUMIN 4.1  --   AST 29  --   ALT  24  --   ALKPHOS 48  --   BILITOT 0.9  --   GFRNONAA >60 >60  ANIONGAP 9 8     Hematology Recent Labs  Lab 06/05/20 1934 06/06/20 0030  WBC 8.3 7.3  RBC 4.55 4.54  HGB 14.7 14.5  HCT 43.6 42.8  MCV 95.8 94.3  MCH 32.3 31.9  MCHC 33.7 33.9  RDW 12.9 12.8  PLT 264 229    BNP Recent Labs  Lab 06/05/20 1936  BNP 320.0*     Radiology    DG Chest 2 View  Result Date: 06/05/2020 CLINICAL DATA:  Chest pain EXAM: CHEST - 2 VIEW COMPARISON:  May 28, 2020 FINDINGS: Left chest pacemaker with leads over right atrium right ventricle and coronary sinus. Similar cardiomegaly. Similar prominent interstitial consistent with mild edema. Trace left pleural effusion. The visualized skeletal structures is unchanged. IMPRESSION: Similar cardiomegaly and mild interstitial edema. Electronically Signed   By: Dahlia Bailiff MD   On: 06/05/2020 20:15   Cath 4/22 1.  Severe calcific stenosis of the RCA with 80% stenosis in the proximal vessel 2.  Moderate, flow obstructive stenosis at the ostium  of the LAD and segmental disease in the mid LAD, significant flow limitation with an RFR of 0.83 3.  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  Right heart catheterization demonstrating elevated wedge pressure, large V waves, and moderate pulmonary hypertension likely secondary to left heart disease  Recommendations: Would consider cardiac surgical revascularization as the patient has hemodynamically significant multivessel coronary artery disease, LV dysfunction, and congestive heart failure.  His coronary artery disease is relatively stable and the patient expresses desire to discuss this with Dr. Agustin Cree as an outpatient.  During this hospitalization, would diurese him (written for IV Lasix 40 mg tonight followed by 20 mg IV twice daily starting tomorrow morning).  Outpatient follow-up with Dr. Agustin Cree to further discuss  revascularization options.  Echo 4/221. At least moderate-severe mitral valve regurgitation. Mechanism appears  to be secondary Carpentier IIIb, with a posteriorly directed, eccentric  jet of MR due to restricted posterior leaflet and relative override of  anterior leaflet. Significant splay  artifact in 2 chamber view suggests MR may be severe, and large V wave  seen on right heart cath. No definite flail segments seen. The mitral  valve is grossly normal. Moderate to severe mitral valve regurgitation. No  evidence of mitral stenosis.  2. Left ventricular ejection fraction by 3D volume is 40 %. The left  ventricle has moderately decreased function. The left ventricle  demonstrates regional wall motion abnormalities (see scoring  diagram/findings for description). The left ventricular  internal cavity size was mildly dilated. There is mild asymmetric left  ventricular hypertrophy of the posterior-lateral segment. Left ventricular  diastolic parameters are consistent with Grade II diastolic dysfunction  (pseudonormalization).  3. Right ventricular systolic function is normal. The right ventricular  size is normal. There is normal pulmonary artery systolic pressure. The  estimated right ventricular systolic pressure is 95.0 mmHg.  4. Left atrial size was severely dilated.  5. Right atrial size was mildly dilated.  6. The aortic valve is grossly normal. There is mild calcification of the  aortic valve. Aortic valve regurgitation is not visualized. Mild aortic  valve sclerosis is present, with no evidence of aortic valve stenosis.  7. The inferior vena cava is normal in size with greater than 50%  respiratory variability, suggesting right atrial pressure of 3 mmHg.   Conclusion(s)/Recommendation(s): No left ventricular mural or apical  thrombus/thrombi.   TEE 4/22 EF 40% Severe mitral valve regurgitation which appears predominantly secondary and ischemic in etiology.   Patient  Profile     75 y.o. male past medical history of coronary artery disease, ischemic cardiomyopathy, history of complete heart block status post pacemaker, carotid artery disease, multiple medication intolerances admitted with dyspnea and unstable angina.  Patient had recent cardiac catheterization 4/22 demonstrating 50% left main, 50% ostial LAD, 60% mid LAD with FFR positive, 50% diagonal, 75% mid diagonal, 80% proximal RCA and anomalous circumflex which was small and calcified.  Also felt to have severe mitral regurgitation.  Had TEE that also apparently showed severe mitral regurgitation.  Assessment & Plan    1 unstable angina-patient denies chest pain this morning.  We will continue aspirin and heparin.  He has had multiple medication intolerances previously but Lipitor and low-dose metoprolol have been initiated.  We will follow for any potential side effects.  Continue IV nitroglycerin.  Patient has significant coronary artery disease on recent cardiac catheterization.  Plan is for evaluation by Dr. Roxy Manns tomorrow for coronary artery  bypass and graft.  2 severe mitral regurgitation-noted on recent transesophageal echocardiogram.  Will likely need mitral valve ring at time of coronary artery bypass graft.  3 paroxysmal atrial fibrillation/flutter-in reviewing telemetry patient has had intermittent atrial fibrillation/flutter.  We will continue heparin and low-dose metoprolol. Will likely need Maze procedure at time of cardiac surgery.  4 previous pacemaker  5 cardiomyopathy-patient is willing to try low-dose metoprolol.  Transition to Toprol later if he tolerates.  Would also add ARB following surgery if blood pressure allows to see if he tolerates.  6 hyperlipidemia-he has been initiated on Lipitor to see if he tolerates.  7 history of carotid artery disease-continue aspirin and statin.  40 to 59% bilaterally on recent Dopplers.  For questions or updates, please contact [place] Please consult [email or web address] for contact info under        Signed, Kirk Ruths, MD  06/06/2020, 9:48 AM

## 2020-06-06 NOTE — Progress Notes (Signed)
[place] for Heparin Indication: chest pain/ACS  Allergies  Allergen Reactions  . Ciprofloxacin Other (See Comments)    Bleeding (intolerance)  . Crestor [Rosuvastatin Calcium] Other (See Comments)    Muscle twinges  . Nexletol [Bempedoic Acid] Other (See Comments)    Muscle and joint pain    Patient Measurements: Height: 5\' 11"  (180.3 cm) Weight: 71 kg (156 lb 8.4 oz) IBW/kg (Calculated) : 75.3 Heparin Dosing Weight: TBW  Vital Signs: BP: 127/75 (04/24 1510) Pulse Rate: 70 (04/24 1510)  Labs: Recent Labs    06/05/20 1934 06/05/20 2220 06/06/20 0015 06/06/20 0030 06/06/20 0608 06/06/20 0946 06/06/20 1824  HGB 14.7  --   --  14.5  --   --   --   HCT 43.6  --   --  42.8  --   --   --   PLT 264  --   --  229  --   --   --   HEPARINUNFRC  --   --  <0.10*  --   --  0.23* 0.20*  CREATININE 0.93  --   --  0.87  --   --   --   TROPONINIHS 27* 31*  --   --  27*  --   --     Estimated Creatinine Clearance: 74.8 mL/min (by C-G formula based on SCr of 0.87 mg/dL).   Medical History: Past Medical History:  Diagnosis Date  . Attention deficit disorder (ADD) 05/05/2019  . Cardiac pacemaker 05/10/2017  . Cardiomyopathy ([place]) 05/08/2017  . Cardiomyopathy ([place]) 05/08/2017  . Complete heart block ([place]) 06/10/2015  . Coronary artery disease 05/15/2017   40% left main, 50% LAD, 50% RCA recent cardiac catheterization from March 2019  . Dyslipidemia 05/08/2017  . Nonischemic cardiomyopathy ([place]) 06/25/2017  . Pacemaker reprogramming/check 06/10/2015  . Peripheral vascular disease ([place]) 05/08/2017   Noncritical bilateral carotid arterial disease  . Pneumonia   . Status post biventricular pacemaker 07/16/2017  . Syncope and collapse    Assessment: 74 YOM presenting with CP and SOB, hx of CAD, he is not on anticoagulation PTA.  CBC wnl  Heparin level remains low at 0.2, on heparin infusion running at 1200 units/hr. CBC stable. No overt bleeding or  infusion issues per RN.  Goal of Therapy:  Heparin level 0.3-0.7 units/ml Monitor platelets by anticoagulation protocol: Yes   Plan:  Increase heparin to 1400 units/hr 6-hr HL Daily HL, CBC, s/sx bleeding  Antonietta Jewel, PharmD, [place] Clinical Pharmacist  Phone: [phone] 06/06/2020 7:54 PM  Please check AMION for all [place] phone numbers After 10:00 PM, call [place] [phone]

## 2020-06-06 NOTE — Progress Notes (Signed)
[place] for Heparin Indication: chest pain/ACS  Allergies  Allergen Reactions  . Ciprofloxacin Other (See Comments)    Bleeding (intolerance)  . Crestor [Rosuvastatin Calcium] Other (See Comments)    Muscle twinges  . Nexletol [Bempedoic Acid] Other (See Comments)    Muscle and joint pain    Patient Measurements: Height: 5\' 11"  (180.3 cm) Weight: 71 kg (156 lb 8.4 oz) IBW/kg (Calculated) : 75.3 Heparin Dosing Weight: TBW  Vital Signs: Temp: 98.3 F (36.8 C) (04/24 0700) Temp Source: Oral (04/24 0700) BP: 106/68 (04/24 1121) Pulse Rate: 65 (04/24 1121)  Labs: Recent Labs    06/05/20 1934 06/05/20 2220 06/06/20 0015 06/06/20 0030 06/06/20 0608 06/06/20 0946  HGB 14.7  --   --  14.5  --   --   HCT 43.6  --   --  42.8  --   --   PLT 264  --   --  229  --   --   HEPARINUNFRC  --   --  <0.10*  --   --  0.23*  CREATININE 0.93  --   --  0.87  --   --   TROPONINIHS 27* 31*  --   --  27*  --     Estimated Creatinine Clearance: 74.8 mL/min (by C-G formula based on SCr of 0.87 mg/dL).   Medical History: Past Medical History:  Diagnosis Date  . Attention deficit disorder (ADD) 05/05/2019  . Cardiac pacemaker 05/10/2017  . Cardiomyopathy ([place]) 05/08/2017  . Cardiomyopathy ([place]) 05/08/2017  . Complete heart block ([place]) 06/10/2015  . Coronary artery disease 05/15/2017   40% left main, 50% LAD, 50% RCA recent cardiac catheterization from March 2019  . Dyslipidemia 05/08/2017  . Nonischemic cardiomyopathy ([place]) 06/25/2017  . Pacemaker reprogramming/check 06/10/2015  . Peripheral vascular disease ([place]) 05/08/2017   Noncritical bilateral carotid arterial disease  . Pneumonia   . Status post biventricular pacemaker 07/16/2017  . Syncope and collapse    Assessment: 24 YOM presenting with CP and SOB, hx of CAD, he is not on anticoagulation PTA.  CBC wnl  Heparin level remains low at 0.23 on drip rate 1000 units/hr. CBC stable. No overt  bleeding or infusion issues noted.  Goal of Therapy:  Heparin level 0.3-0.7 units/ml Monitor platelets by anticoagulation protocol: Yes   Plan:  Increase heparin to 1200 units/hr 6-hr HL Daily HL, CBC, s/sx bleeding  Richardine Service, PharmD, BCPS PGY2 Cardiology Pharmacy Resident Phone: [phone] 06/06/2020  11:28 AM  Please check [email or web address] for unit-specific pharmacy phone numbers.

## 2020-06-06 NOTE — Plan of Care (Signed)

## 2020-06-06 NOTE — Progress Notes (Signed)
[place] for Heparin Indication: chest pain/ACS  Allergies  Allergen Reactions  . Ciprofloxacin Other (See Comments)    Bleeding (intolerance)  . Crestor [Rosuvastatin Calcium] Other (See Comments)    Muscle twinges  . Nexletol [Bempedoic Acid] Other (See Comments)    Muscle and joint pain    Patient Measurements: Height: 5\' 11"  (180.3 cm) Weight: 71 kg (156 lb 8.4 oz) IBW/kg (Calculated) : 75.3 Heparin Dosing Weight: TBW  Vital Signs: Temp: 98.1 F (36.7 C) (04/23 2318) Temp Source: Oral (04/23 2318) BP: 132/83 (04/23 2318) Pulse Rate: 70 (04/23 2318)  Labs: Recent Labs    06/05/20 1934 06/05/20 2220 06/06/20 0015 06/06/20 0030  HGB 14.7  --   --  14.5  HCT 43.6  --   --  42.8  PLT 264  --   --  229  HEPARINUNFRC  --   --  <0.10*  --   CREATININE 0.93  --   --  0.87  TROPONINIHS 27* 31*  --   --     Estimated Creatinine Clearance: 74.8 mL/min (by C-G formula based on SCr of 0.87 mg/dL).   Medical History: Past Medical History:  Diagnosis Date  . Attention deficit disorder (ADD) 05/05/2019  . Cardiac pacemaker 05/10/2017  . Cardiomyopathy ([place]) 05/08/2017  . Cardiomyopathy ([place]) 05/08/2017  . Complete heart block ([place]) 06/10/2015  . Coronary artery disease 05/15/2017   40% left main, 50% LAD, 50% RCA recent cardiac catheterization from March 2019  . Dyslipidemia 05/08/2017  . Nonischemic cardiomyopathy ([place]) 06/25/2017  . Pacemaker reprogramming/check 06/10/2015  . Peripheral vascular disease ([place]) 05/08/2017   Noncritical bilateral carotid arterial disease  . Pneumonia   . Status post biventricular pacemaker 07/16/2017  . Syncope and collapse    Assessment: 33 YOM presenting with CP and SOB, hx of CAD, he is not on anticoagulation PTA.  CBC wnl  4/24 AM update:  Heparin level low Drawn early  Goal of Therapy:  Heparin level 0.3-0.7 units/ml Monitor platelets by anticoagulation protocol: Yes   Plan:  Heparin 2000  units re-[employer] heparin to 1000 units/hr 1000 heparin level  Narda Bonds, PharmD, BCPS Clinical Pharmacist Phone: [phone]

## 2020-06-06 NOTE — Plan of Care (Signed)
Including pain rating scale, medication(s)/side effects and non-pharmacologic comfort measures 

## 2020-06-07 ENCOUNTER — Inpatient Hospital Stay (HOSPITAL_COMMUNITY): Payer: Medicare Other

## 2020-06-07 ENCOUNTER — Encounter (HOSPITAL_COMMUNITY): Payer: Self-pay | Admitting: Cardiology

## 2020-06-07 DIAGNOSIS — I214 Non-ST elevation (NSTEMI) myocardial infarction: Secondary | ICD-10-CM | POA: Diagnosis not present

## 2020-06-07 DIAGNOSIS — I2510 Atherosclerotic heart disease of native coronary artery without angina pectoris: Secondary | ICD-10-CM

## 2020-06-07 DIAGNOSIS — I340 Nonrheumatic mitral (valve) insufficiency: Secondary | ICD-10-CM

## 2020-06-07 LAB — BASIC METABOLIC PANEL
Anion gap: 10 (ref 5–15)
BUN: 17 mg/dL (ref 8–23)
CO2: 22 mmol/L (ref 22–32)
Calcium: 9.1 mg/dL (ref 8.9–10.3)
Chloride: 106 mmol/L (ref 98–111)
Creatinine, Ser: 0.96 mg/dL (ref 0.61–1.24)
GFR, Estimated: >60 mL/min (ref >60)
Glucose, Bld: 91 mg/dL (ref 70–99)
Potassium: 4.1 mmol/L (ref 3.5–5.1)
Sodium: 138 mmol/L (ref 135–145)

## 2020-06-07 LAB — URINALYSIS, COMPLETE (UACMP) WITH MICROSCOPIC
Bacteria, UA: NONE SEEN
Bilirubin Urine: NEGATIVE
Glucose, UA: NEGATIVE mg/dL
Hgb urine dipstick: NEGATIVE
Ketones, ur: NEGATIVE mg/dL
Leukocytes,Ua: NEGATIVE
Nitrite: NEGATIVE
Protein, ur: NEGATIVE mg/dL
Specific Gravity, Urine: 1.005 (ref 1.005–1.030)
pH: 8 (ref 5.0–8.0)

## 2020-06-07 LAB — CBC
HCT: 46.5 % (ref 39.0–52.0)
Hemoglobin: 15.3 g/dL (ref 13.0–17.0)
MCH: 31.9 pg (ref 26.0–34.0)
MCHC: 32.9 g/dL (ref 30.0–36.0)
MCV: 97.1 fL (ref 80.0–100.0)
Platelets: 258 10*3/uL (ref 150–400)
RBC: 4.79 MIL/uL (ref 4.22–5.81)
RDW: 13 % (ref 11.5–15.5)
WBC: 7.2 10*3/uL (ref 4.0–10.5)
nRBC: 0 % (ref 0.0–0.2)

## 2020-06-07 LAB — HEPARIN LEVEL (UNFRACTIONATED): Heparin Unfractionated: 0.72 IU/mL — ABNORMAL HIGH (ref 0.30–0.70)

## 2020-06-07 MED ORDER — ISOSORBIDE MONONITRATE ER 30 MG PO TB24
30.0000 mg | ORAL_TABLET | Freq: Every day | ORAL | Status: DC
  Administered 2020-06-07 – 2020-06-09 (×3): 30 mg via ORAL
  Filled 2020-06-07 (×3): qty 1

## 2020-06-07 MED ORDER — ENOXAPARIN SODIUM 80 MG/0.8ML ~~LOC~~ SOLN
1.0000 mg/kg | Freq: Two times a day (BID) | SUBCUTANEOUS | Status: AC
  Administered 2020-06-07 – 2020-06-09 (×5): 70 mg via SUBCUTANEOUS
  Filled 2020-06-07 (×5): qty 0.8

## 2020-06-07 NOTE — Plan of Care (Signed)

## 2020-06-07 NOTE — Progress Notes (Signed)
Progress Note  Patient Name: Ian Mcgee Date of Encounter: 06/07/2020  [facility] Cardiologist: Jenne Campus, MD   Subjective   No chest pain, no shortness of breath this morning.  On IV heparin on low-dose IV nitroglycerin.  He states that he spoke with Dr. Roxy Manns earlier this morning.  Plans are to have dental evaluation.  Possible surgery later this week.  Inpatient Medications    Scheduled Meds: . aspirin EC  81 mg Oral Daily  . atorvastatin  40 mg Oral Daily  . furosemide  40 mg Oral Daily  . metoprolol tartrate  12.5 mg Oral BID  . multivitamin with minerals  1 tablet Oral Daily   Continuous Infusions: . heparin 1,300 Units/hr (06/07/20 0521)  . nitroGLYCERIN 5 mcg/min (06/07/20 0421)   PRN Meds: acetaminophen, nitroGLYCERIN, ondansetron (ZOFRAN) IV   Vital Signs    Vitals:   06/06/20 2146 06/06/20 2322 06/07/20 0421 06/07/20 0723  BP: 117/70 138/86 132/80 (!) 114/93  Pulse: 68 70 65 75  Resp:  20 20 17   Temp:   97.9 F (36.6 C) 98.4 F (36.9 C)  TempSrc:   Oral Oral  SpO2:  94% 96% 94%  Weight:      Height:        Intake/Output Summary (Last 24 hours) at 06/07/2020 0813 Last data filed at 06/07/2020 0421 Gross per 24 hour  Intake 1426.37 ml  Output 1400 ml  Net 26.37 ml   Last 3 Weights 06/05/2020 06/05/2020 06/03/2020  Weight (lbs) 156 lb 8.4 oz 156 lb 1.4 oz 156 lb 1.4 oz  Weight (kg) 71 kg 70.8 kg 70.8 kg      Telemetry    V pacing.  Occasional atrial tachycardia noted.- Personally Reviewed  ECG    No new- Personally Reviewed  Physical Exam   GEN: No acute distress.   Neck: No JVD Cardiac: RRR, 2/6 holosystolic murmur, no rubs, or gallops.  Respiratory: Clear to auscultation bilaterally. GI: Soft, nontender, non-distended  MS: No edema; No deformity. Neuro:  Nonfocal  Psych: Normal affect   Labs    High Sensitivity Troponin:   Recent Labs  Lab 06/05/20 1934 06/05/20 2220 06/06/20 0608  TROPONINIHS 27* 31* 27*       Chemistry Recent Labs  Lab 06/05/20 1934 06/06/20 0030 06/07/20 0400  NA 138 139 138  K 3.6 3.3* 4.1  CL 102 105 106  CO2 27 26 22   GLUCOSE 92 141* 91  BUN 19 16 17   CREATININE 0.93 0.87 0.96  CALCIUM 9.1 8.9 9.1  PROT 6.6  --   --   ALBUMIN 4.1  --   --   AST 29  --   --   ALT 24  --   --   ALKPHOS 48  --   --   BILITOT 0.9  --   --   GFRNONAA >60 >60 >60  ANIONGAP 9 8 10      Hematology Recent Labs  Lab 06/05/20 1934 06/06/20 0030 06/07/20 0400  WBC 8.3 7.3 7.2  RBC 4.55 4.54 4.79  HGB 14.7 14.5 15.3  HCT 43.6 42.8 46.5  MCV 95.8 94.3 97.1  MCH 32.3 31.9 31.9  MCHC 33.7 33.9 32.9  RDW 12.9 12.8 13.0  PLT 264 229 258    BNP Recent Labs  Lab 06/05/20 1936  BNP 320.0*     DDimer No results for input(s): DDIMER in the last 168 hours.   Radiology    DG Chest 2 View  Result Date: 06/05/2020 CLINICAL DATA:  Chest pain EXAM: CHEST - 2 VIEW COMPARISON:  May 28, 2020 FINDINGS: Left chest pacemaker with leads over right atrium right ventricle and coronary sinus. Similar cardiomegaly. Similar prominent interstitial consistent with mild edema. Trace left pleural effusion. The visualized skeletal structures is unchanged. IMPRESSION: Similar cardiomegaly and mild interstitial edema. Electronically Signed   By: Dahlia Bailiff MD   On: 06/05/2020 20:15    Cardiac Studies   Echocardiogram/TEE 06/03/2020 - Severe mitral regurgitation possibly ischemic in etiology EF 40% pulmonary pressures 32 mmHg, left atrial size severely dilated  Cardiac catheterization 05/28/2020 - Severe calcific stenosis of RCA 80% in the proximal vessel -50% left main disease - Moderate flow obstruction in the ostium of the LAD and mid LAD with flow reserve of 0.83 on catheterization. 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 male with severe  mitral regurgitation, severe coronary artery disease, complete heart block post pacemaker, carotid artery disease, medication intolerances here with dyspnea unstable angina.  Patient was former president of [employer]    Unstable angina/coronary artery disease - Cardiac catheterization reviewed as above, 50% left main as well as significant proximal RCA. -On aspirin heparin (will transition to Lovenox to free him from IV) statin, has had issues with this in the past - I will stop IV nitroglycerin 5 mcg and give him isosorbide 30 mg a day. -  Dr. Roxy Manns has evaluated for CABG, maze and mitral valve repair, talked with him earlier this morning, will have dental evaluation, has had prior tooth infection. -At baseline has been very active playing tennis yard work for instance. - On Lasix 40 mg a day.  Working well he states.  Severe mitral regurgitation - Dr. Roxy Manns to evaluate for repair.  Elevated troponin - Mildly elevated at 27 flat secondary to underlying cardiomyopathy/mitral regurgitation.  Paroxysmal atrial fibrillation/flutter - On telemetry previously has had intermittent atrial fibrillation flutter.  Continuing anticoagulation, I will transition him over to Lovenox to free him from IV.  Low-dose metoprolol. - Maze procedure at time of surgery.  Pacemaker - Functioning well post complete heart block.  Cardiomyopathy - EF 40% on recent echoes.  Has had medication intolerances in the past.  Has had trouble with metoprolol.  Consider adding ARB post surgery.  Hyperlipidemia - Atorvastatin.  Lets see if he tolerates.  History of carotid artery disease - Moderate 40 to 59% bilaterally on recent Dopplers.  On aspirin and statin.  Former smoker - Quit years ago    For questions or updates, please contact [place] Please consult [email or web address] for contact info under        Signed, Candee Furbish, MD  06/07/2020, 8:13 AM

## 2020-06-07 NOTE — Progress Notes (Signed)
[place] for Heparin Indication: chest pain/ACS  Allergies  Allergen Reactions  . Ciprofloxacin Other (See Comments)    Bleeding (intolerance)  . Crestor [Rosuvastatin Calcium] Other (See Comments)    Muscle twinges  . Nexletol [Bempedoic Acid] Other (See Comments)    Muscle and joint pain    Patient Measurements: Height: 5\' 11"  (180.3 cm) Weight: 71 kg (156 lb 8.4 oz) IBW/kg (Calculated) : 75.3 Heparin Dosing Weight: TBW  Vital Signs: Temp: 97.9 F (36.6 C) (04/25 0421) Temp Source: Oral (04/25 0421) BP: 132/80 (04/25 0421) Pulse Rate: 65 (04/25 0421)  Labs: Recent Labs    06/05/20 1934 06/05/20 2220 06/06/20 0015 06/06/20 0030 06/06/20 0608 06/06/20 0946 06/06/20 1824 06/07/20 0400  HGB 14.7  --   --  14.5  --   --   --  15.3  HCT 43.6  --   --  42.8  --   --   --  46.5  PLT 264  --   --  229  --   --   --  258  HEPARINUNFRC  --   --    < >  --   --  0.23* 0.20* 0.72*  CREATININE 0.93  --   --  0.87  --   --   --   --   TROPONINIHS 27* 31*  --   --  27*  --   --   --    < > = values in this interval not displayed.    Estimated Creatinine Clearance: 74.8 mL/min (by C-G formula based on SCr of 0.87 mg/dL).   Medical History: Past Medical History:  Diagnosis Date  . Attention deficit disorder (ADD) 05/05/2019  . Cardiac pacemaker 05/10/2017  . Cardiomyopathy ([place]) 05/08/2017  . Cardiomyopathy ([place]) 05/08/2017  . Complete heart block ([place]) 06/10/2015  . Coronary artery disease 05/15/2017   40% left main, 50% LAD, 50% RCA recent cardiac catheterization from March 2019  . Dyslipidemia 05/08/2017  . Nonischemic cardiomyopathy ([place]) 06/25/2017  . Pacemaker reprogramming/check 06/10/2015  . Peripheral vascular disease ([place]) 05/08/2017   Noncritical bilateral carotid arterial disease  . Pneumonia   . Status post biventricular pacemaker 07/16/2017  . Syncope and collapse    Assessment: 76 YOM presenting with CP and SOB, hx of  CAD, he is not on anticoagulation PTA.  CBC wnl  4/25 AM update:  Heparin level just above goal  Goal of Therapy:  Heparin level 0.3-0.7 units/ml Monitor platelets by anticoagulation protocol: Yes   Plan:  Dec heparin to 1300 units/hr 1400 heparin level  Narda Bonds, PharmD, [place] Pharmacist Phone: [phone]

## 2020-06-07 NOTE — Progress Notes (Signed)
TCTS BRIEF PROGRESS NOTE  Patient seen and examined, chart, cath and echo's reviewed.  Full consult note to follow.  Will ask for Dental Service consult as patient reports that he has a single tooth that was infected recently although symptoms resolved with oral antibiotics.  Rexene Alberts, MD 06/07/2020 8:30 AM

## 2020-06-07 NOTE — Progress Notes (Signed)
Heart Failure Navigator Progress Note  Assessed for Heart & Vascular TOC clinic readiness.  Unfortunately at this time the patient does not meet criteria due to CVTS surgical work up planned for this admission.   Navigator available for reassessment of patient.   Pricilla Holm, RN, BSN Heart Failure Nurse Navigator [phone]

## 2020-06-07 NOTE — Progress Notes (Signed)
[place] for Heparin to enoxaparin Indication: chest pain/ACS  Allergies  Allergen Reactions  . Ciprofloxacin Other (See Comments)    Bleeding (intolerance)  . Crestor [Rosuvastatin Calcium] Other (See Comments)    Muscle twinges  . Nexletol [Bempedoic Acid] Other (See Comments)    Muscle and joint pain    Patient Measurements: Height: 5\' 11"  (180.3 cm) Weight: 71 kg (156 lb 8.4 oz) IBW/kg (Calculated) : 75.3 Heparin Dosing Weight: TBW  Vital Signs: Temp: 98.4 F (36.9 C) (04/25 0723) Temp Source: Oral (04/25 0723) BP: 114/93 (04/25 0723) Pulse Rate: 75 (04/25 0723)  Labs: Recent Labs    06/05/20 1934 06/05/20 2220 06/06/20 0015 06/06/20 0030 06/06/20 6295 06/06/20 0946 06/06/20 1824 06/07/20 0400  HGB 14.7  --   --  14.5  --   --   --  15.3  HCT 43.6  --   --  42.8  --   --   --  46.5  PLT 264  --   --  229  --   --   --  258  HEPARINUNFRC  --   --    < >  --   --  0.23* 0.20* 0.72*  CREATININE 0.93  --   --  0.87  --   --   --  0.96  TROPONINIHS 27* 31*  --   --  27*  --   --   --    < > = values in this interval not displayed.    Estimated Creatinine Clearance: 67.8 mL/min (by C-G formula based on SCr of 0.96 mg/dL).   Medical History: Past Medical History:  Diagnosis Date  . Attention deficit disorder (ADD)   . Cardiac pacemaker 05/10/2017  . Cardiomyopathy ([place])   . Complete heart block ([place]) 06/10/2015  . Coronary artery disease    40% left main, 50% LAD, 50% RCA recent cardiac catheterization from March 2019  . Dyslipidemia   . Mitral regurgitation   . Peripheral vascular disease ([place])    Noncritical bilateral carotid arterial disease  . Pneumonia   . Status post biventricular pacemaker 07/16/2017  . Syncope and collapse    Assessment: 76 YOM presenting with CP and SOB, hx of CAD, he is not on anticoagulation PTA.  CBC wnl  Pharmacy asked to transition heparin to enoxaparin to free IV access.  Goal of  Therapy:  Heparin level 0.3-0.7 units/ml Monitor platelets by anticoagulation protocol: Yes   Plan:  Stop heparin Enoxaparin 1mg /kg SQ BID   Arrie Senate, PharmD, BCPS, [facility] Clinical Pharmacist [phone] Please check AMION for all [facility] Pharmacy numbers 06/07/2020

## 2020-06-07 NOTE — Progress Notes (Signed)
[id number] Gave pt OHS booklet, care guide, staying in the tube handout and IS. Discussed importance of IS and walking after surgery. Pt demonstrated 1600 ml 3x with IS. Discussed sternal precautions and demonstrated how to stand and sit without use of arms. Wrote down how to view pre op video. Pt stated he has been working on home assistance after discharge. Has been walking independently without CP. Questions answered and pt voiced understanding. Will follow up after surgery unless needed to assist with mobility; at this time pt feels comfortable walking independently. Graylon Good RN BSN 06/07/2020 1:48 PM

## 2020-06-07 NOTE — Consult Note (Addendum)
[address]       [place] [address]             [phone]        Ian Mcgee [facility] Medical Record #[id number] Date of Birth: [birth year]  Referring: Olga Millers, MD  Primary Care: Lise Auer, MD Primary Cardiologist:  Gypsy Balsam, MD  Chief Complaint:   Chest pain          History of Present Illness:     Ian Mcgee is a 75 year old former smoker with a past medical history significant for nonischemic cardiomyopathy, complete heart block, status post placement of a biventricular pacer, mitral insufficiency, and cerebrovascular disease with bilateral 40 to 50% internal carotid artery stenoses.  He has longstanding intolerance to multiple cardiac meds including statins, beta-blockers, ACE inhibitor's, and ARB's.  He has a positive family history for coronary artery disease in his father, mother, and brother.  He normally enjoys walking trails frequently and playing tennis on a regular basis but about 4 weeks ago he noticed increasing shortness of breath with activity.  He presented to his cardiologist, Dr. Bing Matter, on 05/27/2020 for initial evaluation.  EKG showed sinus rhythm with a paced ventricle but no acute ischemic changes.  A pro-BNP obtained that day was elevated to in excess of 1000.  Dr. Bing Matter recommended proceeding with left heart catheterization and a referral to Dr. Excell Seltzer.  Right and left heart catheterization was carried out on 05/28/2020 electively and demonstrated severe calcific disease of the right coronary artery with an 80% proximal stenosis.  Additionally, there is a 60 to 70% stenosis at the ostium of the posterior descending coronary artery.  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  The LAD was heavily calcified with a 50% proximal stenosis followed by an 60% mid stenosis.  The diagonal had a 50% proximal stenosis.  The right  heart cath showed elevated wedge pressure with large V waves and moderate pulmonary hypertension.  Further work-up included an transthoracic echocardiogram on 05/29/2020 showing moderate to severe mitral insufficiency with an eccentric, posteriorly directed jet.  The aortic valve was sclerotic but had no significant stenosis or insufficiency.  Tricuspid valve was normal in structure with mild TR.  Given these findings, CT surgery consultation for consideration of coronary bypass grafting and mitral valve repair was discussed with the patient by the cardiology team.  Ian Mcgee preferred to discuss this further on an outpatient basis with his cardiologist, Dr. Bing Matter before making a decision.  He was [facility] from [facility] on 05/29/2020.  He saw Dr. Christella Scheuermann on 05/31/2020 and a decision was made to proceed with transesophageal echo followed by referral to cardiothoracic surgery.  The echo was performed on 06/03/2020 and confirmed severe mitral insufficiency and ejection fraction of 40%.  He was discharged home following this outpatient procedure but then had an episode of chest pain with radiation to his jaw along with shortness of breath at home on 06/05/2020.  His chest pain subsided after taking sublingual nitroglycerin.  He contacted Dr. Bing Matter who recommended evaluation in the emergency room.  He presented to [facility] ED.  Work-up in the ED included chest x-ray that showed clear lung fields and stable cardiomegaly.  High-sensitivity troponin was elevated mildly to 27.  BNP was 320.  Having ruled in for non-ST elevation myocardial infarction, he was admitted to the hospital and  started on heparin and nitroglycerin infusions. Since admission, he has been diuresed aggressively.  He has had no further chest pain and vital signs have been stable.   CT surgery has been asked to evaluate Ian Mcgee for consideration of coronary bypass grafting and mitral  valve repair with possible Maze  procedure. Ian Mcgee reports having painful tooth (left lower rear molar) within the past several months.  This was treated with antibiotics and evaluated by his dentist.  Options for reconstruction or removal with subsequent placement of an implant were discussed at that time.  After the course of antibiotics, he has not had any further pain so there have been no further plans for dental interventions.   Current Activity/ Functional Status:   Zubrod Score: At the time of surgery this patient's most appropriate activity status/level should be described as: []     0    Normal activity, no symptoms []     1    Restricted in physical strenuous activity but ambulatory, able to do out light work [x]     2    Ambulatory and capable of self care, unable to do work activities, up and about                 more than 50%  Of the time                            []     3    Only limited self care, in bed greater than 50% of waking hours []     4    Completely disabled, no self care, confined to bed or chair []     5    Moribund  Past Medical History:  Diagnosis Date  . Attention deficit disorder (ADD)   . Cardiac pacemaker 05/10/2017  . Cardiomyopathy ([place])   . Complete heart block ([place]) 06/10/2015  . Coronary artery disease    40% left main, 50% LAD, 50% RCA recent cardiac catheterization from March 2019  . Dyslipidemia   . Mitral regurgitation   . Peripheral vascular disease ([place])    Noncritical bilateral carotid arterial disease  . Pneumonia   . Status post biventricular pacemaker 07/16/2017  . Syncope and collapse     Past Surgical History:  Procedure Laterality Date  . Basel Cell Cancer    . BIV UPGRADE N/A 06/25/2017   Procedure: BIV UPGRADE;  Surgeon: Constance Haw, MD;  Location: [facility];  Service: Cardiovascular;  Laterality: N/A;  . BUBBLE STUDY  06/03/2020   Procedure: BUBBLE STUDY;  Surgeon: [name], MD;  Location: [facility];  Service: Cardiology;;  .  CATARACT EXTRACTION    . INSERT / REPLACE / REMOVE PACEMAKER    . INTRAVASCULAR PRESSURE WIRE/FFR STUDY N/A 05/28/2020   Procedure: INTRAVASCULAR PRESSURE WIRE/FFR STUDY;  Surgeon: [name], MD;  Location: [facility];  Service: Cardiovascular;  Laterality: N/A;  . KNEE SURGERY Right    ACL Reconstruction  . LEFT HEART CATH AND CORONARY ANGIOGRAPHY N/A 05/10/2017   Procedure: LEFT HEART CATH AND CORONARY ANGIOGRAPHY;  Surgeon: [name], MD;  Location: [facility];  Service: Cardiovascular;  Laterality: N/A;  . RIGHT/LEFT HEART CATH AND CORONARY ANGIOGRAPHY N/A 05/28/2020   Procedure: RIGHT/LEFT HEART CATH AND CORONARY ANGIOGRAPHY;  Surgeon: [name], MD;  Location: [facility];  Service: Cardiovascular;  Laterality: N/A;  . TEE WITHOUT CARDIOVERSION N/A 06/03/2020  Procedure: TRANSESOPHAGEAL ECHOCARDIOGRAM (TEE);  Surgeon: [name], MD;  Location: [facility];  Service: Cardiology;  Laterality: N/A;  . VASECTOMY      Social History   Tobacco Use  Smoking Status Former Smoker  . Types: Cigarettes  Smokeless Tobacco Never Used    Social History   Substance and Sexual Activity  Alcohol Use Yes   Comment: Wine with dinner     Allergies  Allergen Reactions  . Ciprofloxacin Other (See Comments)    Bleeding (intolerance)  . Crestor [Rosuvastatin Calcium] Other (See Comments)    Muscle twinges  . Nexletol [Bempedoic Acid] Other (See Comments)    Muscle and joint pain    Current Facility-Administered Medications  Medication Dose Route Frequency Provider Last Rate Last Admin  . acetaminophen (TYLENOL) tablet 650 mg  650 mg Oral Q4H PRN Renae Fickle, MD      . aspirin EC tablet 81 mg  81 mg Oral Daily Renae Fickle, MD   81 mg at 06/07/20 0929  . atorvastatin (LIPITOR) tablet 40 mg  40 mg Oral Daily Renae Fickle, MD   40 mg at 06/07/20 0929  . enoxaparin (LOVENOX) injection 70 mg  1 mg/kg Subcutaneous Q12H Einar Grad, RPH   70 mg at 06/07/20 [id number]  . furosemide (LASIX) tablet 40 mg  40 mg Oral Daily Renae Fickle, MD   40 mg at 06/07/20 0929  . isosorbide mononitrate (IMDUR) 24 hr tablet 30 mg  30 mg Oral Daily Jerline Pain, MD   30 mg at 06/07/20 0929  . metoprolol tartrate (LOPRESSOR) tablet 12.5 mg  12.5 mg Oral BID Renae Fickle, MD   12.5 mg at 06/07/20 0929  . multivitamin with minerals tablet 1 tablet  1 tablet Oral Daily Renae Fickle, MD   1 tablet at 06/07/20 0929  . nitroGLYCERIN (NITROSTAT) SL tablet 0.4 mg  0.4 mg Sublingual Q5 Min x 3 PRN Renae Fickle, MD      . ondansetron [facility]) injection 4 mg  4 mg Intravenous Q6H PRN Renae Fickle, MD        Medications Prior to Admission  Medication Sig Dispense Refill Last Dose  . aspirin EC 81 MG EC tablet Take 1 tablet (81 mg total) by mouth daily. Swallow whole. 30 tablet 11 06/05/2020 at am  . B-12, Methylcobalamin, 1000 MCG SUBL Place 1,000 mcg under the tongue every morning.   06/05/2020 at am  . furosemide (LASIX) 40 MG tablet Take 40 mg daily for weight gain of 3 lbs overnight or 5 lbs in 1 week. (Patient taking differently: Take 40 mg by mouth daily as needed (weight gain of 3 lbs overnight or 5 lbs in 1 week).) 90 tablet 3 06/05/2020 at am  . Multiple Vitamin (MULTIVITAMIN) capsule Take 1 capsule by mouth every morning.   06/05/2020 at am  . nitroGLYCERIN (NITROSTAT) 0.4 MG SL tablet Place 1 tablet (0.4 mg total) under the tongue every 5 (five) minutes x 3 doses as needed for chest pain. 25 tablet 3 06/05/2020 at 8:16pm  . Probiotic Product (PROBIOTIC PO) Take 1 tablet by mouth every morning.   06/05/2020 at am  . psyllium (METAMUCIL) 58.6 % packet Take 1 packet by mouth every morning.   06/05/2020 at am  . amoxicillin (AMOXIL) 500 MG capsule Take 500 mg by mouth every 6 (six) hours.   not  yet    Family History  Problem Relation Age of Onset  . CAD Mother   . Hypertension  Mother   . Osteoporosis Mother   . CAD Father    . Leukemia Father   . Ovarian cancer Sister   . Healthy Brother   . CAD Brother   . Rheumatic fever Brother      Review of Systems:     Cardiac Review of Systems: Y or  [    ]= no  Chest Pain [  x  ]  Resting SOB [ x  ] Exertional SOB  [x  ]  Orthopnea [  ]   Pedal Edema [   ]    Palpitations [  ] Syncope  [  ]   Presyncope [   ]  General Review of Systems: [Y] = yes [  ]=no Constitional: recent weight change [  ]; anorexia [  ]; fatigue [  ]; nausea [  ]; night sweats [  ]; fever [  ]; or chills [  ]                                                                Eye : blurred vision [  ]; diplopia [   ]; vision changes [  ];  Amaurosis fugax[  ]; Resp: cough [  ];  wheezing[  ];  hemoptysis[  ]; shortness of breath[  ]; paroxysmal nocturnal dyspnea[  ]; dyspnea on exertion[  ]; or orthopnea[  ];  GI:  gallstones[  ], vomiting[  ];  dysphagia[  ]; melena[  ];  hematochezia [  ]; heartburn[  ];   Hx of  Colonoscopy[  ]; GU: kidney stones [  ]; hematuria[  ];   dysuria [  ];  nocturia[  ];  history of     obstruction [  ]; urinary frequency [  ]             Skin: rash, swelling[  ];, hair loss[  ];  peripheral edema[  ];  or itching[  ]; Musculosketetal: myalgias[  ];  joint swelling[  ];  joint erythema[  ];  joint pain[  ];  back pain[  ];  Heme/Lymph: bruising[  ];  bleeding[  ];  anemia[  ];  Neuro: TIA[  ];  headaches[  ];  stroke[  ];  vertigo[  ];  seizures[  ];   paresthesias[  ];  difficulty walking[  ];  Psych:depression[  ]; anxiety[  ];  Endocrine: diabetes[  ];  thyroid dysfunction[  ];               Physical Exam: BP (!) 114/93 (BP Location: Left Arm)   Pulse 75   Temp 98.4 F (36.9 C) (Oral)   Resp 17   Ht 5\' 11"  (1.803 m)   Wt 71 kg   SpO2 94%   BMI 21.83 kg/m    General appearance: alert, cooperative and no distress Head: Normocephalic, without obvious abnormality, atraumatic Neck: no adenopathy, no carotid bruit, no JVD and supple, symmetrical, trachea  midline Lymph nodes:  No cervical or clavicular adenopathy Resp: Breath sounds are full, equal, and clear to auscultation. Cardio: Heart is in a regular rate and rhythm.  There is a grade 3/6 systolic murmur loudest over the left sternal border.  This is also transmitted to the right  carotid artery. GI: Soft and nontender, no obvious organomegaly Extremities: No obvious deformity, all extremities are well perfused.  There are palpable pulses throughout. Neurologic: Grossly normal  Diagnostic Studies & Laboratory data:  INTRAVASCULAR PRESSURE WIRE/FFR STUDY  RIGHT/LEFT HEART CATH AND CORONARY ANGIOGRAPHY    Conclusion  1.  Severe calcific stenosis of the RCA with 80% stenosis in the proximal vessel 2.  Moderate, flow obstructive stenosis at the ostium of the LAD and segmental disease in the mid LAD, significant flow limitation with an RFR of 0.83 3.  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  Right heart catheterization demonstrating elevated wedge pressure, large V waves, and moderate pulmonary hypertension likely secondary to left heart disease  Recommendations: Would consider cardiac surgical revascularization as the patient has hemodynamically significant multivessel coronary artery disease, LV dysfunction, and congestive heart failure.  His coronary artery disease is relatively stable and the patient expresses desire to discuss this with Dr. Agustin Cree as an outpatient.  During this hospitalization, would diurese him (written for IV Lasix 40 mg tonight followed by 20 mg IV twice daily starting tomorrow morning).  Outpatient follow-up with Dr. Agustin Cree to further discuss revascularization options.  Surgeon Notes    06/03/2020 1:17 PM CV Procedure signed by [name], MD    Indications  Acute on chronic systolic heart failure ([place]) [id number] (ICD-10-CM)]   Procedural Details  Technical Details  INDICATION: 74 year old gentleman with known calcific coronary artery disease, demonstrated to have moderate stenosis in the past.  He has been managed medically.  He presented with symptoms of congestive heart failure and is referred back for right and left heart catheterization for hemodynamic assessment as well as reassessment of coronary anatomy.  PROCEDURAL DETAILS: Ultrasound guidance is used to access the right basilic vein.  A 7 French sheath was inserted.  Ultrasound images are captured and stored in the patient's digital record.  The right wrist was then prepped, draped, and anesthetized with 1% lidocaine. Using direct ultrasound guidance technique a 5/6 French Slender sheath was placed in the right radial artery. Intra-arterial verapamil was administered through the radial artery sheath. IV heparin was administered after a JR4 catheter was advanced into the central aorta. A Swan-Ganz catheter was used for the right heart catheterization. Standard protocol was followed for recording of right heart pressures and sampling of oxygen saturations. Fick cardiac output was calculated. Standard Judkins catheters were used for selective coronary angiography. LV pressure is recorded and an aortic valve pullback is performed.  A 5 French EBU 3.5 cm guide is inserted and pressure wire analysis of the LAD is performed.  A pressure wire X wire is used.  The wire is equalized at the guide tip per protocol.  The wires were then advanced into the mid LAD beyond the area of stenosis.  There were no immediate procedural complications. The patient was transferred to the post catheterization recovery area for further monitoring.    Estimated blood loss <50 mL.   During this procedure medications were administered to achieve and maintain moderate conscious sedation while the patient's heart rate, blood pressure, and oxygen saturation were continuously monitored and I was present face-to-face 100% of this time.    Medications (Filter: Administrations occurring from 1645 to 1811 on 05/28/20)  lidocaine (PF) (XYLOCAINE) 1 % injection (mL) Total volume:  5 mL  Date/Time Rate/Dose/Volume Action   05/28/20 1651 5 mL Given    Heparin (Porcine) in NaCl 1000-0.9 UT/500ML-%  SOLN (mL) Total volume:  1,000 mL  Date/Time Rate/Dose/Volume Action   05/28/20 1651 500 mL Given   1651 500 mL Given    Radial Cocktail/Verapamil only (mL) Total volume:  10 mL  Date/Time Rate/Dose/Volume Action   05/28/20 1653 10 mL Given    heparin sodium (porcine) injection (Units) Total dose:  6,000 Units  Date/Time Rate/Dose/Volume Action   05/28/20 1708 4,000 Units Given   1721 2,000 Units Given    iohexol (OMNIPAQUE) 350 MG/ML injection (mL) Total volume:  90 mL  Date/Time Rate/Dose/Volume Action   05/28/20 1752 90 mL Given     Contrast  Medication Name Total Dose  iohexol (OMNIPAQUE) 350 MG/ML injection 90 mL    Radiation/Fluoro  Fluoro time: 8.5 (min) DAP: 12459 (mGycm2) Cumulative Air Kerma: 206 (mGy)   Coronary Findings   Diagnostic Dominance: Right  Left Main  Vessel is large. There is mild diffuse disease throughout the vessel. The vessel originates from a separate ostium. The vessel is moderately calcified.  Ost LM lesion is 50% stenosed. The lesion is focal, discrete and eccentric.  Left Anterior Descending  Vessel is large. There is moderate focal disease in the vessel. The vessel originates from a separate ostium. The vessel is moderately calcified.  Prox LAD-1 lesion is 50% stenosed. The lesion is discrete and tubular. The lesion is calcified.  Prox LAD-2 lesion is 5% stenosed. The lesion is located at the major branch. Mild ectatic dilation at the takeoff of bifurcating diagonal branch  Prox LAD to Mid LAD lesion is 60% stenosed. The lesion is tubular and concentric. The lesion is severely calcified. Diffuse, severely calcified mid-vessel stenosis  First Diagonal Branch  Vessel  is moderate in size. There is moderate disease in the vessel.  Ost 1st Diag to 1st Diag lesion is 75% stenosed.  Lateral First Diagonal Branch  Vessel is small in size. There is moderate diffuse disease in the vessel.  Second Diagonal Branch  Vessel is small in size.  Third Diagonal Branch  Vessel is small in size.  Left Circumflex  Vessel is moderate in size. There is moderate diffuse disease throughout the vessel. The vessel originates from the left coronary cusp. Unable to selectively engage. Small, calcified vessel, unable to assess for obstructive disease.  First Obtuse Marginal Branch  Vessel is small in size.  Second Obtuse Marginal Branch  Vessel is small in size.  Right Coronary Artery  Vessel is large. There is mild diffuse disease throughout the vessel. The vessel is moderately calcified.  Prox RCA lesion is 80% stenosed. The lesion is focal and eccentric. The lesion is severely calcified. Progressed from previous cath study  Acute Marginal Branch  Vessel is small in size. There is mild disease in the vessel.  Right Posterior Atrioventricular Artery  Vessel is moderate in size.   Intervention   No interventions have been documented.  Right Heart  Right Heart Pressures Hemodynamic findings consistent with moderate pulmonary hypertension. Elevated LV EDP consistent with volume overload.   Coronary Diagrams   Diagnostic Dominance: Right       INDICATIONS: Severe MR  PROCEDURE:     TRANSESOPHAGEAL ECHOCARDIOGRAPHY  Informed consent was obtained prior to the procedure. The risks, benefits and alternatives for the procedure were discussed and the patient comprehended these risks.  Risks include, but are not limited to, cough, sore throat, vomiting, nausea, somnolence, esophageal and stomach trauma or perforation, bleeding, low blood pressure, aspiration, pneumonia, infection, trauma to the teeth and death.  After a procedural time-out, the oropharynx was  anesthetized with 20% benzocaine spray.   During this procedure the patient was administered propofol per anesthesia.  The patient's heart rate, blood pressure, and oxygen saturation were monitored continuously during the procedure. The period of conscious sedation was 30 minutes, of which I was present face-to-face 100% of this time.  The transesophageal probe was inserted in the esophagus and stomach without difficulty and multiple views were obtained.  The patient was kept under observation until the patient left the procedure room.  The patient left the procedure room in stable condition.   Agitated microbubble saline contrast was administered.  COMPLICATIONS:    There were no immediate complications.  FINDINGS:   1. At least moderate-severe mitral valve regurgitation. Mechanism appears  to be secondary Carpentier IIIb, with a posteriorly directed, eccentric  jet of MR due to restricted posterior leaflet and relative override of  anterior leaflet. Significant splay  artifact in 2 chamber view suggests MR may be severe, and large V wave  seen on right heart cath. No definite flail segments seen. The mitral  valve is grossly normal. Moderate to severe mitral valve regurgitation. No  evidence of mitral stenosis.  2. Left ventricular ejection fraction by 3D volume is 40 %. The left  ventricle has moderately decreased function. The left ventricle  demonstrates regional wall motion abnormalities (see scoring  diagram/findings for description). The left ventricular  internal cavity size was mildly dilated. There is mild asymmetric left  ventricular hypertrophy of the posterior-lateral segment. Left ventricular  diastolic parameters are consistent with Grade II diastolic dysfunction  (pseudonormalization).  3. Right ventricular systolic function is normal. The right ventricular  size is normal. There is normal pulmonary artery systolic pressure. The  estimated right ventricular  systolic pressure is [id number] mmHg.  4. Left atrial size was severely dilated.  5. Right atrial size was mildly dilated.  6. The aortic valve is grossly normal. There is mild calcification of the  aortic valve. Aortic valve regurgitation is not visualized. Mild aortic  valve sclerosis is present, with no evidence of aortic valve stenosis.  7. The inferior vena cava is normal in size with greater than 50%  respiratory variability, suggesting right atrial pressure of 3 mmHg.   Conclusion(s)/Recommendation(s): No left ventricular mural or apical  thrombus/thrombi.   FORMAL ECHOCARDIOGRAM REPORT PENDING EF 40% Severe mitral valve regurgitation which appears predominantly secondary and ischemic in etiology.  RECOMMENDATIONS:    Discharge home when alert.   Time Spent Directly with the Patient:  60 minutes   [name] 06/03/2020, 1:15 PM      Recent Radiology Findings:   DG Orthopantogram  Result Date: 06/07/2020 CLINICAL DATA:  History of dental infection. EXAM: ORTHOPANTOGRAM/PANORAMIC COMPARISON:  None. FINDINGS: No fracture or lytic destruction is seen involving the mandible. IMPRESSION: No significant abnormality seen involving the mandible. Electronically Signed   By: Marijo Conception M.D.   On: 06/07/2020 11:16   DG Chest 2 View  Result Date: 06/05/2020 CLINICAL DATA:  Chest pain EXAM: CHEST - 2 VIEW COMPARISON:  May 28, 2020 FINDINGS: Left chest pacemaker with leads over right atrium right ventricle and coronary sinus. Similar cardiomegaly. Similar prominent interstitial consistent with mild edema. Trace left pleural effusion. The visualized skeletal structures is unchanged. IMPRESSION: Similar cardiomegaly and mild interstitial edema. Electronically Signed   By: Dahlia Bailiff MD   On: 06/05/2020 20:15     I have independently reviewed the above radiologic studies and  discussed with the patient   Recent Lab Findings: Lab Results  Component Value Date   WBC  7.2 06/07/2020   HGB 15.3 06/07/2020   HCT 46.5 06/07/2020   PLT 258 06/07/2020   GLUCOSE 91 06/07/2020   CHOL 196 05/29/2020   TRIG 92 05/29/2020   HDL 71 05/29/2020   LDLCALC 107 (H) 05/29/2020   ALT 24 06/05/2020   AST 29 06/05/2020   NA 138 06/07/2020   K 4.1 06/07/2020   CL 106 06/07/2020   CREATININE 0.96 06/07/2020   BUN 17 06/07/2020   CO2 22 06/07/2020   INR 1.0 05/08/2017      Assessment / Plan:    -Ian Mcgee is a pleasant 75 year old male with the above described past medical history with known coronary artery disease and severe mitral insufficiency that is symptomatic.  Coronary bypass grafting and mitral valve repair with possible replacement is recommended by Dr. Roxy Manns.  We will tentatively plan for surgery on Thursday, 06/09/2020.  The general nature of the procedures and expected postoperative progress were discussed at length with Ian Mcgee along with his wife and daughter who were in attendance.  Ian Mcgee would like for [place] to proceed with appropriate work-up and plans for surgery as described.  -History of complete heart block-this was initially diagnosed many years ago.  He is currently using his third pacemaker which is a biventricular device.    -Recent tooth infection involving left lower rear molar--this was treated with antibiotics and is currently not causing him any pain.  He has been told by his dentist this should be removed.  Orthopantogram obtained today shows no abnormality.  We will request dental consult to evaluate this in anticipation of surgery.  I  spent 40 minutes counseling the patient face to face.   Antony Odea, PA-C  06/07/2020 1:24 PM    STS Risk Calculator  Procedure: MV Repair + CAB Risk of Mortality: 4.926% Renal Failure: 2.452% Permanent Stroke: 4.521% Prolonged Ventilation: 15.979% DSW Infection: 0.192% Reoperation: 5.645% Morbidity or Mortality: 26.681% Short Length of Stay: 15.007% Long Length  of Stay: 13.543%   I have seen and examined the patient and agree with the assessment and plan as outlined above by Enid Cutter, PA-C.  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 Agustin Cree for complete heart block status post permanent pacemaker placement, dilated nonischemic cardiomyopathy, status post biventricular pacemaker upgrade, coronary artery disease, mitral regurgitation, and cerebrovascular disease who presents with progressive symptoms of exertional shortness of breath, intermittent exertional jaw pain, and at least 2 episodes of substernal chest pressure associated with severe shortness of breath consistent with unstable angina and acute on chronic combined systolic and diastolic congestive heart failure.  Despite his known history of significant chronic left ventricular systolic dysfunction patient has remained remarkably active physically until recently.  I have personally reviewed the patient's recent transthoracic and transesophageal echocardiograms and diagnostic cardiac catheterization.  Patient has dilated cardiomyopathy with severe mitral regurgitation.  Functional anatomy of mitral valve disease is consistent with secondary mitral regurgitation due to a combination of annular dilatation and type IIIb functional restriction of mitral valve leaflet motion.  Diagnostic cardiac catheterization reveals severe three-vessel coronary artery disease including long segment diffuse stenosis of the left anterior descending coronary artery that was documented to be flow-limiting on FFR analysis, high-grade 80 to 90% proximal stenosis of the right coronary artery, and diffuse long segment 75% stenosis of large diagonal branch of  the left anterior descending coronary artery with small anomalous left circumflex.  Catheterization also revealed moderate pulmonary hypertension with large V waves on wedge tracing consistent with severe mitral  regurgitation.  Cardiac output remain normal with relatively low central venous pressure.  I agree the patient would best be treated with coronary artery bypass grafting and mitral valve repair or replacement.  He appears to have relatively good distal target vessels for surgical revascularization.  I am hopeful that ring annuloplasty will be adequate for valve repair although the patient does have significant downward displacement of the mitral apparatus due to the degree of left ventricular chamber enlargement.  If valve repair is not feasible I would favor valve replacement using cord preserving techniques.  Given the history of episodes of paroxysmal atrial fibrillation and atrial flutter the patient might benefit from concomitant Maze procedure, although the added complexity and duration will need to be weighed against any associated benefits due to the severity of LV dysfunction.  The patient, his wife and his daughter were all counseled at length regarding his numerous ongoing medical problems including severe multivessel coronary artery disease with unstable angina and severe dilated cardiomyopathy with secondary mitral regurgitation and acute on chronic combined systolic and diastolic congestive heart failure.  We discussed treatment options at length.  We discussed the impact of his age, current state of health, and the relative lack of additional significant comorbid medical problems on clinical decision making.  We went on to discuss the indications, risks and potential benefits of coronary artery bypass grafting with mitral valve repair or mitral replacement as well as the timing of surgical intervention.  The rationale for elective surgery has been explained, including a comparison between surgery and continued medical therapy with close follow-up.  The likelihood of successful and durable mitral valve repair has been discussed with particular reference to the findings of the most recent  echocardiogram.  Based upon these findings and previous experience, I have quoted a greater than 80 percent likelihood of successful valve repair with less than 5 percent risk of mortality or major morbidity.  In the event that the patient's valve cannot be successfully repaired, we discussed the possibility of replacing the mitral valve using a mechanical prosthesis with the attendant need for long-term anticoagulation using warfarin versus the alternative of replacing it using a bioprosthetic tissue valve with its potential for late structural valve deterioration and failure, depending upon the patient's longevity.  The patient specifically requests that if the mitral valve must be replaced that it be done using a bioprosthetic tissue valve.   The relative risks and benefits of performing a maze procedure at the time of his surgery was discussed at length, including the expected likelihood of long term freedom from recurrent symptomatic atrial fibrillation and/or atrial flutter.  All questions have been addressed.  We tentatively plan to proceed with surgery on Thursday, June 10, 2020.    I spent in excess of 90 minutes during the conduct of this hospital encounter and >50% of this time involved direct face-to-face encounter with the patient for counseling and/or coordination of their care.         Rexene Alberts, MD 06/07/2020 4:07 PM

## 2020-06-08 ENCOUNTER — Inpatient Hospital Stay (HOSPITAL_COMMUNITY): Payer: Medicare Other

## 2020-06-08 DIAGNOSIS — Z01818 Encounter for other preprocedural examination: Secondary | ICD-10-CM | POA: Diagnosis not present

## 2020-06-08 DIAGNOSIS — I214 Non-ST elevation (NSTEMI) myocardial infarction: Secondary | ICD-10-CM | POA: Diagnosis not present

## 2020-06-08 DIAGNOSIS — I340 Nonrheumatic mitral (valve) insufficiency: Secondary | ICD-10-CM

## 2020-06-08 LAB — ECHO TEE
Height: 71 in
Weight: 2497.37 oz

## 2020-06-08 NOTE — Care Management Important Message (Signed)
Important Message  Patient Details  Name: Ian Mcgee MRN: [id number] Date of Birth: 09-06-1945   Medicare Important Message Given:  Yes     Orbie Pyo 06/08/2020, 2:20 PM

## 2020-06-08 NOTE — Plan of Care (Signed)

## 2020-06-08 NOTE — Progress Notes (Signed)
Progress Note  Patient Name: Ian Mcgee Date of Encounter: 06/08/2020  [facility] Cardiologist: Jenne Campus, MD   Subjective   Feels well, no further chest pain or shortness of breath.  Good overall diuresis yesterday 2.5 L.  Lasix 40 mg p.o.    Inpatient Medications    Scheduled Meds: . aspirin EC  81 mg Oral Daily  . atorvastatin  40 mg Oral Daily  . enoxaparin (LOVENOX) injection  1 mg/kg Subcutaneous Q12H  . furosemide  40 mg Oral Daily  . isosorbide mononitrate  30 mg Oral Daily  . metoprolol tartrate  12.5 mg Oral BID  . multivitamin with minerals  1 tablet Oral Daily   Continuous Infusions:  PRN Meds: acetaminophen, nitroGLYCERIN, ondansetron (ZOFRAN) IV   Vital Signs    Vitals:   06/07/20 2119 06/07/20 2321 06/08/20 0321 06/08/20 0734  BP: 119/62 112/75 119/81 111/85  Pulse: 67 68 74 85  Resp:  16 12 19   Temp:  98.1 F (36.7 C) 98 F (36.7 C) 97.8 F (36.6 C)  TempSrc:  Oral Oral Oral  SpO2:  96% 97% 95%  Weight:      Height:        Intake/Output Summary (Last 24 hours) at 06/08/2020 0910 Last data filed at 06/08/2020 0321 Gross per 24 hour  Intake 367.09 ml  Output 2900 ml  Net -2532.91 ml   Last 3 Weights 06/05/2020 06/05/2020 06/03/2020  Weight (lbs) 156 lb 8.4 oz 156 lb 1.4 oz 156 lb 1.4 oz  Weight (kg) 71 kg 70.8 kg 70.8 kg      Telemetry    Ventricular pacing noted.  Previous bouts of atrial fibrillation/flutter noted.- Personally Reviewed  ECG    No new- Personally Reviewed  Physical Exam   GEN: No acute distress.   Neck: No JVD Cardiac: RRR, 2/6 holosystolic murmur,no rubs, or gallops.  Respiratory: Clear to auscultation bilaterally. GI: Soft, nontender, non-distended  MS: No edema; No deformity. Neuro:  Nonfocal  Psych: Normal affect   Labs    High Sensitivity Troponin:   Recent Labs  Lab 06/05/20 1934 06/05/20 2220 06/06/20 0608  TROPONINIHS 27* 31* 27*      Chemistry Recent Labs  Lab  06/05/20 1934 06/06/20 0030 06/07/20 0400  NA 138 139 138  K 3.6 3.3* 4.1  CL 102 105 106  CO2 27 26 22   GLUCOSE 92 141* 91  BUN 19 16 17   CREATININE 0.93 0.87 0.96  CALCIUM 9.1 8.9 9.1  PROT 6.6  --   --   ALBUMIN 4.1  --   --   AST 29  --   --   ALT 24  --   --   ALKPHOS 48  --   --   BILITOT 0.9  --   --   GFRNONAA >60 >60 >60  ANIONGAP 9 8 10      Hematology Recent Labs  Lab 06/05/20 1934 06/06/20 0030 06/07/20 0400  WBC 8.3 7.3 7.2  RBC 4.55 4.54 4.79  HGB 14.7 14.5 15.3  HCT 43.6 42.8 46.5  MCV 95.8 94.3 97.1  MCH 32.3 31.9 31.9  MCHC 33.7 33.9 32.9  RDW 12.9 12.8 13.0  PLT 264 229 258    BNP Recent Labs  Lab 06/05/20 1936  BNP 320.0*     DDimer No results for input(s): DDIMER in the last 168 hours.   Radiology    DG Orthopantogram  Result Date: 06/07/2020 CLINICAL DATA:  History of dental infection.  EXAM: ORTHOPANTOGRAM/PANORAMIC COMPARISON:  None. FINDINGS: No fracture or lytic destruction is seen involving the mandible. IMPRESSION: No significant abnormality seen involving the mandible. Electronically Signed   By: Marijo Conception M.D.   On: 06/07/2020 11:16    Cardiac Studies    Echocardiogram/TEE 06/03/2020 - Severe mitral regurgitation possibly ischemic in etiology EF 40% pulmonary pressures 32 mmHg, left atrial size severely dilated  Cardiac catheterization 05/28/2020 - Severe calcific stenosis of RCA 80% in the proximal vessel -50% left main disease - Moderate flow obstruction in the ostium of the LAD and mid LAD with flow reserve of 0.83 on catheterization. 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 male with severe mitral regurgitation, severe coronary artery disease, complete heart block post pacemaker, carotid artery disease, medication intolerances here with dyspnea unstable angina.   Patient was former president of [employer]    Unstable angina/coronary artery disease - Cardiac catheterization as above, 50% left main disease as well as proximal RCA.  Transition him to Lovenox from IV heparin to help free him from IV.  Also gave him isosorbide 30 mg a day yesterday. - Dr. Guy Sandifer consultation reviewed, plan for surgery Thursday the 28th. - Good baseline function, tennis, yard work for instance.  Severe mitral regurgitation - Repair.  Lasix 40 mg.  Good diuresis.  Breathing much better.  Paroxysmal atrial fibrillation flutter - Noted on telemetry previously.  Consider Maze procedure at time of surgery.  On full dose Lovenox  Pacemaker - Functioning well post complete heart block  Cardiomyopathy - EF 40% with severe mitral gravitation.  Medication intolerances in the past.  Has had trouble with metoprolol.  Post surgery, we will try to add angiotensin receptor blocker.  Hyperlipidemia - On atorvastatin reinitiation during this hospitalization.  He had trouble tolerating previously.  Carotid artery disease moderate - Bilateral on aspirin and statin now.  Former smoker - Quit years ago.  Dental - Dr. Roxy Manns has asked dental service consult secondary to single tooth that was infected recently, symptoms resolved with oral antibiotics.  For questions or updates, please contact [facility] Please consult [email or web address] for contact info under        Signed, Candee Furbish, MD  06/08/2020, 9:10 AM

## 2020-06-09 ENCOUNTER — Encounter: Payer: PRIVATE HEALTH INSURANCE | Admitting: Thoracic Surgery (Cardiothoracic Vascular Surgery)

## 2020-06-09 DIAGNOSIS — K08109 Complete loss of teeth, unspecified cause, unspecified class: Secondary | ICD-10-CM | POA: Diagnosis present

## 2020-06-09 DIAGNOSIS — I214 Non-ST elevation (NSTEMI) myocardial infarction: Secondary | ICD-10-CM | POA: Diagnosis not present

## 2020-06-09 DIAGNOSIS — K06020 Generalized gingival recession, unspecified: Secondary | ICD-10-CM | POA: Diagnosis present

## 2020-06-09 DIAGNOSIS — K0850 Unsatisfactory restoration of tooth, unspecified: Secondary | ICD-10-CM | POA: Diagnosis present

## 2020-06-09 DIAGNOSIS — I340 Nonrheumatic mitral (valve) insufficiency: Secondary | ICD-10-CM

## 2020-06-09 DIAGNOSIS — I2510 Atherosclerotic heart disease of native coronary artery without angina pectoris: Secondary | ICD-10-CM

## 2020-06-09 DIAGNOSIS — K045 Chronic apical periodontitis: Secondary | ICD-10-CM | POA: Diagnosis not present

## 2020-06-09 DIAGNOSIS — Z01818 Encounter for other preprocedural examination: Secondary | ICD-10-CM

## 2020-06-09 DIAGNOSIS — K0510 Chronic gingivitis, plaque induced: Secondary | ICD-10-CM | POA: Diagnosis present

## 2020-06-09 LAB — BLOOD GAS, ARTERIAL
Acid-Base Excess: 3.1 mmol/L — ABNORMAL HIGH (ref 0.0–2.0)
Bicarbonate: 26.9 mmol/L (ref 20.0–28.0)
FIO2: 21
O2 Saturation: 95.1 %
Patient temperature: 37
pCO2 arterial: 39.9 mmHg (ref 32.0–48.0)
pH, Arterial: 7.445 (ref 7.350–7.450)
pO2, Arterial: 73.7 mmHg — ABNORMAL LOW (ref 83.0–108.0)

## 2020-06-09 LAB — COMPREHENSIVE METABOLIC PANEL
ALT: 20 U/L (ref 0–44)
AST: 23 U/L (ref 15–41)
Albumin: 3.6 g/dL (ref 3.5–5.0)
Alkaline Phosphatase: 40 U/L (ref 38–126)
Anion gap: 9 (ref 5–15)
BUN: 14 mg/dL (ref 8–23)
CO2: 26 mmol/L (ref 22–32)
Calcium: 9 mg/dL (ref 8.9–10.3)
Chloride: 103 mmol/L (ref 98–111)
Creatinine, Ser: 0.87 mg/dL (ref 0.61–1.24)
GFR, Estimated: >60 mL/min (ref >60)
Glucose, Bld: 82 mg/dL (ref 70–99)
Potassium: 3.7 mmol/L (ref 3.5–5.1)
Sodium: 138 mmol/L (ref 135–145)
Total Bilirubin: 0.6 mg/dL (ref 0.3–1.2)
Total Protein: 5.9 g/dL — ABNORMAL LOW (ref 6.5–8.1)

## 2020-06-09 LAB — CBC
HCT: 41.8 % (ref 39.0–52.0)
Hemoglobin: 14.1 g/dL (ref 13.0–17.0)
MCH: 32 pg (ref 26.0–34.0)
MCHC: 33.7 g/dL (ref 30.0–36.0)
MCV: 95 fL (ref 80.0–100.0)
Platelets: 248 10*3/uL (ref 150–400)
RBC: 4.4 MIL/uL (ref 4.22–5.81)
RDW: 12.9 % (ref 11.5–15.5)
WBC: 5.9 10*3/uL (ref 4.0–10.5)
nRBC: 0 % (ref 0.0–0.2)

## 2020-06-09 LAB — HEMOGLOBIN A1C
Hgb A1c MFr Bld: 5.2 % (ref 4.8–5.6)
Mean Plasma Glucose: 102.54 mg/dL

## 2020-06-09 LAB — PROTIME-INR
INR: 1.1 (ref 0.8–1.2)
Prothrombin Time: 13.8 seconds (ref 11.4–15.2)

## 2020-06-09 LAB — TYPE AND SCREEN
ABO/RH(D): O NEG
Antibody Screen: NEGATIVE

## 2020-06-09 LAB — PREALBUMIN: Prealbumin: 27.5 mg/dL (ref 18–38)

## 2020-06-09 LAB — ABO/RH: ABO/RH(D): O NEG

## 2020-06-09 LAB — APTT: aPTT: 41 seconds — ABNORMAL HIGH (ref 24–36)

## 2020-06-09 MED ORDER — POTASSIUM CHLORIDE 2 MEQ/ML IV SOLN
80.0000 meq | INTRAVENOUS | Status: DC
  Filled 2020-06-09: qty 40

## 2020-06-09 MED ORDER — SODIUM CHLORIDE 0.9 % IV SOLN
INTRAVENOUS | Status: DC
  Filled 2020-06-09: qty 30

## 2020-06-09 MED ORDER — PHENYLEPHRINE HCL-NACL 20-0.9 MG/250ML-% IV SOLN
30.0000 ug/min | INTRAVENOUS | Status: AC
  Administered 2020-06-10: 50 ug/min via INTRAVENOUS
  Filled 2020-06-09: qty 250

## 2020-06-09 MED ORDER — MANNITOL 20 % IV SOLN
INTRAVENOUS | Status: DC
  Filled 2020-06-09: qty 13

## 2020-06-09 MED ORDER — BISACODYL 5 MG PO TBEC: 5.0000 mg | DELAYED_RELEASE_TABLET | Freq: Once | ORAL | Status: DC

## 2020-06-09 MED ORDER — NOREPINEPHRINE 4 MG/250ML-% IV SOLN
0.0000 ug/min | INTRAVENOUS | Status: AC
Start: 2020-06-10 — End: 2020-06-10
  Administered 2020-06-10: 5 ug/min via INTRAVENOUS
  Filled 2020-06-09: qty 250

## 2020-06-09 MED ORDER — TEMAZEPAM 15 MG PO CAPS: 15.0000 mg | ORAL_CAPSULE | Freq: Once | ORAL | Status: DC | PRN

## 2020-06-09 MED ORDER — INSULIN REGULAR(HUMAN) IN NACL 100-0.9 UT/100ML-% IV SOLN
INTRAVENOUS | Status: AC
  Administered 2020-06-10: 1.5 [IU]/h via INTRAVENOUS
  Filled 2020-06-09: qty 100

## 2020-06-09 MED ORDER — PLASMA-LYTE 148 IV SOLN
INTRAVENOUS | Status: DC
  Filled 2020-06-09: qty 2.5

## 2020-06-09 MED ORDER — CHLORHEXIDINE GLUCONATE 4 % EX LIQD
60.0000 mL | Freq: Once | CUTANEOUS | Status: AC
  Administered 2020-06-09: 4 via TOPICAL
  Filled 2020-06-09: qty 60

## 2020-06-09 MED ORDER — VANCOMYCIN HCL 1250 MG/250ML IV SOLN
1250.0000 mg | INTRAVENOUS | Status: AC
  Administered 2020-06-10: 1250 mg via INTRAVENOUS
  Filled 2020-06-09: qty 250

## 2020-06-09 MED ORDER — TRANEXAMIC ACID (OHS) PUMP PRIME SOLUTION
2.0000 mg/kg | INTRAVENOUS | Status: DC
  Filled 2020-06-09: qty 1.42

## 2020-06-09 MED ORDER — EPINEPHRINE HCL 5 MG/250ML IV SOLN IN NS
0.0000 ug/min | INTRAVENOUS | Status: DC
  Filled 2020-06-09: qty 250

## 2020-06-09 MED ORDER — CHLORHEXIDINE GLUCONATE 0.12 % MT SOLN
15.0000 mL | Freq: Once | OROMUCOSAL | Status: AC
  Administered 2020-06-10: 15 mL via OROMUCOSAL
  Filled 2020-06-09: qty 15

## 2020-06-09 MED ORDER — SODIUM CHLORIDE 0.9 % IV SOLN
750.0000 mg | INTRAVENOUS | Status: AC
  Administered 2020-06-10: 750 mg via INTRAVENOUS
  Filled 2020-06-09: qty 750

## 2020-06-09 MED ORDER — DEXMEDETOMIDINE HCL IN NACL 400 MCG/100ML IV SOLN
0.1000 ug/kg/h | INTRAVENOUS | Status: AC
  Administered 2020-06-10: .7 ug/kg/h via INTRAVENOUS
  Filled 2020-06-09: qty 100

## 2020-06-09 MED ORDER — GLUTARALDEHYDE 0.625% SOAKING SOLUTION
TOPICAL | Status: DC
  Filled 2020-06-09: qty 50

## 2020-06-09 MED ORDER — TRANEXAMIC ACID 1000 MG/10ML IV SOLN
1.5000 mg/kg/h | INTRAVENOUS | Status: AC
  Administered 2020-06-10: 1.5 mg/kg/h via INTRAVENOUS
  Filled 2020-06-09: qty 25

## 2020-06-09 MED ORDER — NITROGLYCERIN IN D5W 200-5 MCG/ML-% IV SOLN
2.0000 ug/min | INTRAVENOUS | Status: DC
  Filled 2020-06-09: qty 250

## 2020-06-09 MED ORDER — SODIUM CHLORIDE 0.9 % IV SOLN
1.5000 g | INTRAVENOUS | Status: AC
  Administered 2020-06-10: 1.5 g via INTRAVENOUS
  Filled 2020-06-09: qty 1.5

## 2020-06-09 MED ORDER — VANCOMYCIN HCL 1000 MG IV SOLR
INTRAVENOUS | Status: DC
  Filled 2020-06-09: qty 1000

## 2020-06-09 MED ORDER — CHLORHEXIDINE GLUCONATE 4 % EX LIQD
60.0000 mL | Freq: Once | CUTANEOUS | Status: AC
  Administered 2020-06-10: 4 via TOPICAL
  Filled 2020-06-09: qty 60

## 2020-06-09 MED ORDER — METOPROLOL TARTRATE 12.5 MG HALF TABLET
12.5000 mg | ORAL_TABLET | Freq: Once | ORAL | Status: AC
  Administered 2020-06-10: 12.5 mg via ORAL
  Filled 2020-06-09: qty 1

## 2020-06-09 MED ORDER — TRANEXAMIC ACID (OHS) BOLUS VIA INFUSION
15.0000 mg/kg | INTRAVENOUS | Status: AC
  Administered 2020-06-10: 1065 mg via INTRAVENOUS
  Filled 2020-06-09: qty 1065

## 2020-06-09 MED ORDER — MILRINONE LACTATE IN DEXTROSE 20-5 MG/100ML-% IV SOLN
0.3000 ug/kg/min | INTRAVENOUS | Status: AC
  Administered 2020-06-10: .25 ug/kg/min via INTRAVENOUS
  Filled 2020-06-09: qty 100

## 2020-06-09 NOTE — Plan of Care (Signed)

## 2020-06-09 NOTE — Consult Note (Signed)
Department of Dental Medicine     INPATIENT CONSULTATION  Service Date:   06/09/2020 Admitted Date:  06/05/2020  Patient Name:  Ian Mcgee Date of Birth:   [birth year] Medical Record Number: [id number]  Referring Provider:              Tressie Stalker, MD  PLAN & RECOMMENDATIONS   > There are no current signs of acute odontogenic infection including abscess, edema or erythema, or suspicious lesion requiring biopsy.  The patient does have a cracked tooth in the lower left quadrant that may become problematic in the future if he does not have it addressed and #31 that appears to have a periapical radiolucency on the radiograph which needs to be monitored. >> Recommend the patient continue to follow-up with his primary dentist for routine dental care and for definitive treatment on teeth numbers 18 and 31 to decrease the risk of perioperative and postoperative systemic infection and complications. >>> Plan to discuss case with medical team and coordinate treatment as needed.  >>  Discussed in detail all treatment options with the patient and they are agreeable to the plan.   Thank you for consulting with [facility] and for the opportunity to participate in this patient's treatment.  Should you have any questions or concerns, please contact the [facility] at [phone].   06/09/2020     CONSULT NOTE   COVID 19 SCREENING: The patient denies symptoms concerning for COVID-19 infection including fever, chills, cough, or newly developed shortness of breath.   HISTORY OF PRESENT ILLNESS: >> Ian Mcgee is a very pleasant 75 y.o. male with h/o complete heart block, cardiac pacemaker, CAD, HTN, hyperlipidemia, angina, ADHD and peripheral vascular disease who is currently admitted for mitral regurgitation and worsening coronary artery disease anticipating CABG/MVR on 4/28.  Hospital dentistry was consulted to evaluate the patient as part of their medically  necessary pre-cardiac surgery work-up.  DENTAL HISTORY: >The patient reports that he does have a dentist he sees regularly every 6 mos.  He states that he has one tooth in the lower left quadrant that is cracked (points to tooth #18), and ~ 1 1/2 years ago he was prescribed a 1 week course of antibiotics due to pain associated with the tooth.  He was subsequently referred to an endodontist who deemed the tooth non-restorable and recommended an extraction, bon grafting and implant placement to replace the tooth, however the patient elected to not move forward with definitive treatment at that time and to hang on to the tooth for as long as he could.  He reports not having any pain, swelling or trouble with the tooth since that time and his last dental check-up was a few months ago.  He currently denies any dental/orofacial pain or sensitivity. >> Patient is able to manage oral secretions.  Patient denies dysphagia, odynophagia, dysphonia, SOB and neck pain.  Patient denies fever, rigors and malaise.   CHIEF COMPLAINT:  Preoperative inpatient dental consultation.   Patient Active Problem List   Diagnosis Date Noted  . NSTEMI (non-ST elevated myocardial infarction) (HCC) 06/06/2020  . Essential hypertension   . Chest pain 06/05/2020  . Mitral regurgitation   . Angina of effort (HCC) 05/28/2020  . Acute on chronic systolic heart failure (HCC)   . Pneumonia   . Syncope and collapse   . Hyperlipidemia   . Attention deficit disorder (ADD) 05/05/2019  . Status post biventricular pacemaker 07/16/2017  . Coronary artery disease 05/15/2017  .  Cardiac pacemaker 05/10/2017  . Cardiomyopathy ([place]) 05/08/2017  . Dyslipidemia 05/08/2017  . Peripheral vascular disease ([place]) 05/08/2017  . Complete heart block ([place]) 06/10/2015  . Pacemaker reprogramming/check 06/10/2015   Past Medical History:  Diagnosis Date  . Attention deficit disorder (ADD)   . Cardiac pacemaker 05/10/2017  . Cardiomyopathy ([place])    . Complete heart block ([place]) 06/10/2015  . Coronary artery disease    40% left main, 50% LAD, 50% RCA recent cardiac catheterization from March 2019  . Dyslipidemia   . Mitral regurgitation   . Peripheral vascular disease ([place])    Noncritical bilateral carotid arterial disease  . Pneumonia   . Status post biventricular pacemaker 07/16/2017  . Syncope and collapse    Past Surgical History:  Procedure Laterality Date  . Basel Cell Cancer    . BIV UPGRADE N/A 06/25/2017   Procedure: BIV UPGRADE;  Surgeon: Constance Haw, MD;  Location: [place] [facility];  Service: Cardiovascular;  Laterality: N/A;  . BUBBLE STUDY  06/03/2020   Procedure: BUBBLE STUDY;  Surgeon: [name], MD;  Location: [facility] ENDOSCOPY;  Service: Cardiology;;  . CATARACT EXTRACTION    . INSERT / REPLACE / REMOVE PACEMAKER    . INTRAVASCULAR PRESSURE WIRE/FFR STUDY N/A 05/28/2020   Procedure: INTRAVASCULAR PRESSURE WIRE/FFR STUDY;  Surgeon: [name], MD;  Location: [place] [facility];  Service: Cardiovascular;  Laterality: N/A;  . KNEE SURGERY Right    ACL Reconstruction  . LEFT HEART CATH AND CORONARY ANGIOGRAPHY N/A 05/10/2017   Procedure: LEFT HEART CATH AND CORONARY ANGIOGRAPHY;  Surgeon: [name], MD;  Location: [place] [facility];  Service: Cardiovascular;  Laterality: N/A;  . RIGHT/LEFT HEART CATH AND CORONARY ANGIOGRAPHY N/A 05/28/2020   Procedure: RIGHT/LEFT HEART CATH AND CORONARY ANGIOGRAPHY;  Surgeon: [name], MD;  Location: [place] [facility];  Service: Cardiovascular;  Laterality: N/A;  . TEE WITHOUT CARDIOVERSION N/A 06/03/2020   Procedure: TRANSESOPHAGEAL ECHOCARDIOGRAM (TEE);  Surgeon: [name], MD;  Location: [facility];  Service: Cardiology;  Laterality: N/A;  . VASECTOMY     Allergies  Allergen Reactions  . Ciprofloxacin Other (See Comments)    Bleeding (intolerance)  . Crestor [Rosuvastatin Calcium] Other (See Comments)    Muscle twinges  . Nexletol  [Bempedoic Acid] Other (See Comments)    Muscle and joint pain   Current Facility-Administered Medications  Medication Dose Route Frequency Provider Last Rate Last Admin  . acetaminophen (TYLENOL) tablet 650 mg  650 mg Oral Q4H PRN Renae Fickle, MD      . aspirin EC tablet 81 mg  81 mg Oral Daily Renae Fickle, MD   81 mg at 06/09/20 0859  . atorvastatin (LIPITOR) tablet 40 mg  40 mg Oral Daily Renae Fickle, MD   40 mg at 06/09/20 0859  . [START ON 06/10/2020] cefUROXime (ZINACEF) 1.5 g in sodium chloride 0.9 % 100 mL IVPB  1.5 g Intravenous To OR Rexene Alberts, MD      . Derrill Memo ON 06/10/2020] cefUROXime (ZINACEF) 750 mg in sodium chloride 0.9 % 100 mL IVPB  750 mg Intravenous To OR Rexene Alberts, MD      . Derrill Memo ON 06/10/2020] dexmedetomidine (PRECEDEX) 400 MCG/100ML (4 mcg/mL) infusion  0.1-0.7 mcg/kg/hr Intravenous To OR Rexene Alberts, MD      . Derrill Memo ON 06/10/2020] EPINEPHrine (ADRENALIN) 4 mg in NS 250 mL (0.016 mg/mL) premix infusion  0-10 mcg/min Intravenous To OR Rexene Alberts, MD      .  furosemide (LASIX) tablet 40 mg  40 mg Oral Daily Renae Fickle, MD   40 mg at 06/09/20 0859  . [START ON 06/10/2020] glutaraldehyde 0.625% cardiac soaking solution   Topical To OR Rexene Alberts, MD      . Derrill Memo ON 06/10/2020] heparin 30,000 units/NS 1000 mL solution for CELLSAVER   Other To OR Rexene Alberts, MD      . Derrill Memo ON 06/10/2020] heparin sodium (porcine) 2,500 Units, papaverine 30 mg in electrolyte-148 (PLASMALYTE-148) 500 mL irrigation   Irrigation To OR Rexene Alberts, MD      . Derrill Memo ON 06/10/2020] insulin regular, human (MYXREDLIN) 100 units/ 100 mL infusion   Intravenous To OR Rexene Alberts, MD      . isosorbide mononitrate (IMDUR) 24 hr tablet 30 mg  30 mg Oral Daily Jerline Pain, MD   30 mg at 06/09/20 0859  . [START ON 06/10/2020] [place] Blood Cardioplegia vial (lidocaine/magnesium/mannitol 0.26g-4g-6.4g)   Intracoronary To OR Rexene Alberts, MD       . metoprolol tartrate (LOPRESSOR) tablet 12.5 mg  12.5 mg Oral BID Renae Fickle, MD   12.5 mg at 06/09/20 0859  . [START ON 06/10/2020] milrinone (PRIMACOR) 20 MG/100 ML (0.2 mg/mL) infusion  0.3 mcg/kg/min Intravenous To OR Rexene Alberts, MD      . multivitamin with minerals tablet 1 tablet  1 tablet Oral Daily Renae Fickle, MD   1 tablet at 06/09/20 0859  . nitroGLYCERIN (NITROSTAT) SL tablet 0.4 mg  0.4 mg Sublingual Q5 Min x 3 PRN Renae Fickle, MD      . Derrill Memo ON 06/10/2020] nitroGLYCERIN 50 mg in dextrose 5 % 250 mL (0.2 mg/mL) infusion  2-200 mcg/min Intravenous To OR Rexene Alberts, MD      . Derrill Memo ON 06/10/2020] norepinephrine (LEVOPHED) 4mg  in 253mL premix infusion  0-40 mcg/min Intravenous To OR Rexene Alberts, MD      . ondansetron [facility]) injection 4 mg  4 mg Intravenous Q6H PRN Renae Fickle, MD      . Derrill Memo ON 06/10/2020] phenylephrine (NEOSYNEPHRINE) 20-0.9 MG/250ML-% infusion  30-200 mcg/min Intravenous To OR Rexene Alberts, MD      . Derrill Memo ON 06/10/2020] potassium chloride injection 80 mEq  80 mEq Other To OR Rexene Alberts, MD      . Derrill Memo ON 06/10/2020] tranexamic acid (CYKLOKAPRON) 2,500 mg in sodium chloride 0.9 % 250 mL (10 mg/mL) infusion  1.5 mg/kg/hr Intravenous To OR Rexene Alberts, MD      . Derrill Memo ON 06/10/2020] tranexamic acid (CYKLOKAPRON) bolus via infusion - over 30 minutes 1,065 mg  15 mg/kg Intravenous To OR Rexene Alberts, MD      . Derrill Memo ON 06/10/2020] tranexamic acid (CYKLOKAPRON) pump prime solution 142 mg  2 mg/kg Intracatheter To OR Rexene Alberts, MD      . Derrill Memo ON 06/10/2020] vancomycin (VANCOREADY) IVPB 1250 mg/250 mL  1,250 mg Intravenous To OR Rexene Alberts, MD        LABS: Lab Results  Component Value Date   WBC 5.9 06/09/2020   HGB 14.1 06/09/2020   HCT 41.8 06/09/2020   MCV 95.0 06/09/2020   PLT 248 06/09/2020      Component Value Date/Time   NA 138 06/09/2020 0034   NA 142 05/27/2020 1535   K 3.7  06/09/2020 0034   CL 103 06/09/2020 0034   CO2 26 06/09/2020 0034   GLUCOSE 82 06/09/2020 0034  BUN 14 06/09/2020 0034   BUN 14 05/27/2020 1535   CREATININE 0.87 06/09/2020 0034   CALCIUM 9.0 06/09/2020 0034   GFRNONAA >60 06/09/2020 0034   GFRAA 101 03/18/2020 0938   Lab Results  Component Value Date   INR 1.1 06/09/2020   INR 1.0 05/08/2017   No results found for: PTT  Social History   Socioeconomic History  . Marital status: Married    Spouse name: Not on file  . Number of children: Not on file  . Years of education: Not on file  . Highest education level: Not on file  Occupational History  . Occupation: Engineer, drilling  Tobacco Use  . Smoking status: Former Smoker    Types: Cigarettes  . Smokeless tobacco: Never Used  Vaping Use  . Vaping Use: Never used  Substance and Sexual Activity  . Alcohol use: Yes    Comment: Wine with dinner  . Drug use: Never  . Sexual activity: Not on file  Other Topics Concern  . Not on file  Social History Narrative  . Not on file   Social Determinants of Health   Financial Resource Strain: Not on file  Food Insecurity: Not on file  Transportation Needs: Not on file  Physical Activity: Not on file  Stress: Not on file  Social Connections: Not on file  Intimate Partner Violence: Not on file   Family History  Problem Relation Age of Onset  . CAD Mother   . Hypertension Mother   . Osteoporosis Mother   . CAD Father   . Leukemia Father   . Ovarian cancer Sister   . Healthy Brother   . CAD Brother   . Rheumatic fever Brother     REVIEW OF SYSTEMS: Reviewed with the patient as per HPI. PSYCH: Patient denies having dental phobia.  VITAL SIGNS: BP (!) 127/58   Pulse 73   Temp 98.8 F (37.1 C) (Oral)   Resp 19   Ht 5\' 11"  (1.803 m)   Wt 71 kg   SpO2 95%   BMI 21.83 kg/m    PHYSICAL EXAM: >> General:  Well-developed, comfortable and in no apparent distress. >> Neurological:  Alert and oriented to  person, place and  time. >> Extraoral:  Facial symmetry present without any edema or erythema.  No swelling or lymphadenopathy.  TMJ asymptomatic without clicks or crepitations. >> Intraoral:  Soft tissues appear well-perfused and mucous membranes moist.  FOM and vestibules soft and not raised. Oral cavity without mass or lesion. No signs of infection, parulis, sinus tract, edema or erythema evident upon exam.   DENTAL EXAM: All clinical findings charted.  >> Dentition:  Overall good remaining dentition.  Missing teeth, existing restorations and crowns.   >> The patient is maintaining fair oral hygiene.  >> Periodontal: Pink, healthy gingival tissue with blunted papilla.  Generalized plaque accumulation.  Generalized gingival recession >> Defective Restorations: #18 cracked tooth with full-coverage gold crown. >> Endodontics: #5, #18, #24 and #31 previous root canal treatment with full-coverage crowns. >> Removable/Fixed Prosthodontics: #3, #4, #5, #12, #14, #15, #18, #19, #24, #29, #30 and #31 have full-coverage crowns. #13 is an existing endosteal implant with definitive crown.  >> Occlusion: Non-functional tooth #31   RADIOGRAPHIC EXAM:  06/07/20 Orthopanogram interpreted. >> Condyles seated bilaterally in fossas.  No evidence of abnormal pathology.  All visualized osseous structures appear WNL. >> Generalized mild horizontal bone loss. Missing teeth, existing restorations, multiple teeth with existing endodontic therapy and definitive crowns. #  18 and #31 have periapical radiolucencies of the mesial roots and have had previous endo therapy. #18 appears to have bone loss with furcation involvement.    ASSESSMENT:  1. Mitral regurgitation 2. Coronary artery disease 3. Preoperative dental consultation 3. Missing teeth 4. Chronic apical periodontitis 5. Defective dental restoration 6. Gingivitis 7. Gingival recession, generalized 8. Postoperative bleeding risk (on heparin  drip)   PLAN AND RECOMMENDATIONS: > I discussed the risks, benefits, and complications of various scenarios with the patient in relationship to their medical and dental conditions, which included systemic infection such as endocarditis, bacteremia or other serious issues that could potentially occur either before, during or after their anticipated heart surgery if dental/oral concerns are not addressed.  I explained that if any chronic or acute dental/oral infection(s) are addressed and subsequently not maintained following medical optimization and recovery, their risk of the previously mentioned complications are just as high and could potentially occur postoperatively.  I explained all significant findings of the dental consultation with the patient including teeth numbers 18 and 31 which appear to have periapical concerns on his Xray, and the recommended care including returning to his regular dentist after his heart surgery and recovery for definitive treatment on these teeth and/or close monitoring to make sure they do not become acutely infected and in order to optimize them following heart surgery from a dental standpoint.  I explained the importance of having tooth #18 extracted since it is non-restorable and although it may or may not become infected in the near future, it is not worth taking the risk of just leaving it and hoping it does not cause any problems. The patient verbalized understanding of all findings, discussion, and recommendations. >> We then discussed various treatment options to include no treatment, multiple extractions with alveoloplasty, pre-prosthetic surgery as indicated, periodontal therapy, dental restorations, root canal therapy, crown and bridge therapy, implant therapy, and replacement of missing teeth as indicated.  The patient verbalized understanding of all options, and currently wishes to proceed with extraction of tooth #18 as soon as he is medically optimized following  his heart surgery and returning to his regular dentist for routine dental care including periodic exams, cleanings and replacement of missing teeth as needed. >>>  Plan to discuss all findings and recommendations with medical team and coordinate future care as needed.   <> The patient tolerated today's visit well.  All questions and concerns were addressed and answered before conclusion of the consultation.  [place], D.M.D.

## 2020-06-09 NOTE — Progress Notes (Signed)
      [address]       [place] [address]             [phone]     CARDIOTHORACIC SURGERY PROGRESS NOTE  Subjective: No complaints.  No SOB or chest discomfort  Objective: Vital signs in last 24 hours: Temp:  [97.5 F (36.4 C)-98.8 F (37.1 C)] 97.5 F (36.4 C) (04/27 1116) Pulse Rate:  [62-73] 62 (04/27 1116) Cardiac Rhythm: Ventricular paced (04/27 0720) Resp:  [14-19] 14 (04/27 1116) BP: (107-127)/(58-87) 124/68 (04/27 1116) SpO2:  [95 %-99 %] 99 % (04/27 1116)  Physical Exam:  Rhythm:   paced  Breath sounds: clear  Heart sounds:  RRR w/ blowing holosystolic murmur  Incisions:  n/a  Abdomen:  soft  Extremities:  warm   Intake/Output from previous day: 04/26 0701 - 04/27 0700 In: 240 [P.O.:240] Out: 3100 [Urine:3100] Intake/Output this shift: No intake/output data recorded.  Lab Results: Recent Labs    06/07/20 0400 06/09/20 0034  WBC 7.2 5.9  HGB 15.3 14.1  HCT 46.5 41.8  PLT 258 248   BMET:  Recent Labs    06/07/20 0400 06/09/20 0034  NA 138 138  K 4.1 3.7  CL 106 103  CO2 22 26  GLUCOSE 91 82  BUN 17 14  CREATININE 0.96 0.87  CALCIUM 9.1 9.0    CBG (last 3)  No results for input(s): GLUCAP in the last 72 hours. PT/INR:   Recent Labs    06/09/20 0034  LABPROT 13.8  INR 1.1    CXR:  CHEST - 2 VIEW  COMPARISON:  May 28, 2020  FINDINGS: Left chest pacemaker with leads over right atrium right ventricle and coronary sinus. Similar cardiomegaly. Similar prominent interstitial consistent with mild edema. Trace left pleural effusion. The visualized skeletal structures is unchanged.  IMPRESSION: Similar cardiomegaly and mild interstitial edema.   Electronically Signed   By: Dahlia Bailiff MD   On: 06/05/2020 20:15   Assessment/Plan: S/P Procedure(s) (LRB): MITRAL VALVE REPAIR OR REPLACEMENT  (MVR) (N/A) CORONARY ARTERY BYPASS GRAFTING (CABG) (N/A) possible MAZE (N/A) TRANSESOPHAGEAL  ECHOCARDIOGRAM (TEE) (N/A)  The patient was again counseled regarding the indications, risk, and potential benefits of mitral valve repair or replacement, coronary artery bypass grafting, and possible Maze procedure.  Expectations for his postoperative recovery have been discussed.  All of his questions have been addressed. The patient understands and accepts all potential risks of surgery including but not limited to risk of death, stroke or other neurologic complication, myocardial infarction, congestive heart failure, respiratory failure, renal failure, bleeding requiring transfusion and/or reexploration, arrhythmia, heart block or bradycardia requiring permanent pacemaker insertion, infection or other wound complications, pneumonia, pleural and/or pericardial effusion, pulmonary embolus, aortic dissection or other major vascular complication, or other immediate or delayed complications related to valve repair or replacement including but not limited to recurrent or persistent mitral regurgitation and/or mitral stenosis, LV outflow tract obstruction, aortic insufficiency, paravalvular leak, posterior AV groove disruption, structural valve deterioration and failure, thrombosis, embolization, or endocarditis. All of his questions have been answered.   I spent in excess of 15 minutes during the conduct of this hospital encounter and >50% of this time involved direct face-to-face encounter with the patient for counseling and/or coordination of their care.    Rexene Alberts, MD 06/09/2020 2:19 PM

## 2020-06-09 NOTE — Progress Notes (Signed)
Progress Note  Patient Name: Ian Mcgee Date of Encounter: 06/09/2020  [facility] Cardiologist: Jenne Campus, MD   Subjective   Ambulating hallway.  Doing well without any chest pain.  Inpatient Medications    Scheduled Meds: . aspirin EC  81 mg Oral Daily  . atorvastatin  40 mg Oral Daily  . [START ON 06/10/2020] epinephrine  0-10 mcg/min Intravenous To OR  . furosemide  40 mg Oral Daily  . [START ON 06/10/2020] glutaraldehyde   Topical To OR  . [START ON 06/10/2020] heparin-papaverine-plasmalyte irrigation   Irrigation To OR  . [START ON 06/10/2020] insulin   Intravenous To OR  . isosorbide mononitrate  30 mg Oral Daily  . [START ON 06/10/2020] [place] Blood Cardioplegia vial (lidocaine/magnesium/mannitol 0.26g-4g-6.4g)   Intracoronary To OR  . metoprolol tartrate  12.5 mg Oral BID  . multivitamin with minerals  1 tablet Oral Daily  . [START ON 06/10/2020] phenylephrine  30-200 mcg/min Intravenous To OR  . [START ON 06/10/2020] potassium chloride  80 mEq Other To OR  . [START ON 06/10/2020] tranexamic acid  15 mg/kg Intravenous To OR  . [START ON 06/10/2020] tranexamic acid  2 mg/kg Intracatheter To OR   Continuous Infusions: . [START ON 06/10/2020] cefUROXime (ZINACEF)  IV    . [START ON 06/10/2020] cefUROXime (ZINACEF)  IV    . [START ON 06/10/2020] dexmedetomidine    . [START ON 06/10/2020] heparin 30,000 units/NS 1000 mL solution for CELLSAVER    . [START ON 06/10/2020] milrinone    . [START ON 06/10/2020] nitroGLYCERIN    . [START ON 06/10/2020] norepinephrine    . [START ON 06/10/2020] tranexamic acid (CYKLOKAPRON) infusion ([facility])    . [START ON 06/10/2020] vancomycin     PRN Meds: acetaminophen, nitroGLYCERIN, ondansetron (ZOFRAN) IV   Vital Signs    Vitals:   06/09/20 0357 06/09/20 0720 06/09/20 0859 06/09/20 0900  BP: (!) 107/58 122/87 (!) 127/58 (!) 127/58  Pulse:  69 73   Resp: 16 16  19   Temp: 97.6 F (36.4 C) 98.8 F (37.1 C)    TempSrc: Oral  Oral    SpO2: 97% 95%    Weight:      Height:        Intake/Output Summary (Last 24 hours) at 06/09/2020 0949 Last data filed at 06/09/2020 0651 Gross per 24 hour  Intake 240 ml  Output 3100 ml  Net -2860 ml   Last 3 Weights 06/05/2020 06/05/2020 06/03/2020  Weight (lbs) 156 lb 8.4 oz 156 lb 1.4 oz 156 lb 1.4 oz  Weight (kg) 71 kg 70.8 kg 70.8 kg      Telemetry    V pacing noted- Personally Reviewed  ECG    No new- Personally Reviewed  Physical Exam   GEN: No acute distress.  Alert and oriented x3, normal respiratory effort no edema   Labs    High Sensitivity Troponin:   Recent Labs  Lab 06/05/20 1934 06/05/20 2220 06/06/20 0608  TROPONINIHS 27* 31* 27*      Chemistry Recent Labs  Lab 06/05/20 1934 06/06/20 0030 06/07/20 0400 06/09/20 0034  NA 138 139 138 138  K 3.6 3.3* 4.1 3.7  CL 102 105 106 103  CO2 27 26 22 26   GLUCOSE 92 141* 91 82  BUN 19 16 17 14   CREATININE 0.93 0.87 0.96 0.87  CALCIUM 9.1 8.9 9.1 9.0  PROT 6.6  --   --  5.9*  ALBUMIN 4.1  --   --  3.6  AST 29  --   --  23  ALT 24  --   --  20  ALKPHOS 48  --   --  40  BILITOT 0.9  --   --  0.6  GFRNONAA >60 >60 >60 >60  ANIONGAP 9 8 10 9      Hematology Recent Labs  Lab 06/06/20 0030 06/07/20 0400 06/09/20 0034  WBC 7.3 7.2 5.9  RBC 4.54 4.79 4.40  HGB 14.5 15.3 14.1  HCT 42.8 46.5 41.8  MCV 94.3 97.1 95.0  MCH 31.9 31.9 32.0  MCHC 33.9 32.9 33.7  RDW 12.8 13.0 12.9  PLT 229 258 248    BNP Recent Labs  Lab 06/05/20 1936  BNP 320.0*     DDimer No results for input(s): DDIMER in the last 168 hours.   Radiology    DG Orthopantogram  Result Date: 06/07/2020 CLINICAL DATA:  History of dental infection. EXAM: ORTHOPANTOGRAM/PANORAMIC COMPARISON:  None. FINDINGS: No fracture or lytic destruction is seen involving the mandible. IMPRESSION: No significant abnormality seen involving the mandible. Electronically Signed   By: Marijo Conception M.D.   On: 06/07/2020 11:16   VAS  US DOPPLER PRE CABG  Result Date: 06/08/2020 PREOPERATIVE VASCULAR EVALUATION Patient Name:  Ian Mcgee  Date of Exam:   06/08/2020 Medical Rec #: [id number]          Accession #:    [id number] Date of Birth: Jan 30, 1946          Patient Gender: M Patient Age:   61Y Exam Location:  [facility] Procedure:      VAS US DOPPLER PRE CABG Referring Phys: [address] [place] --------------------------------------------------------------------------------  Risk Factors:     Past history of smoking, coronary artery disease. Comparison Study: Carotid on 03/15/20 showed bilateral 40-59% stenosis. However,                   velocities obtained fall in the 1-39% range.                   Stable today. Performing Technologist: Oda Cogan RDMS, RVT  Examination Guidelines: A complete evaluation includes B-mode imaging, spectral Doppler, color Doppler, and power Doppler as needed of all accessible portions of each vessel. Bilateral testing is considered an integral part of a complete examination. Limited examinations for reoccurring indications may be performed as noted.  Right Carotid Findings: +----------+--------+--------+--------+------------+--------+           PSV cm/sEDV cm/sStenosisDescribe    Comments +----------+--------+--------+--------+------------+--------+ CCA Prox  100     27                                   +----------+--------+--------+--------+------------+--------+ CCA Distal81      27                                   +----------+--------+--------+--------+------------+--------+ ICA Prox  96      37      1-39%   heterogenous         +----------+--------+--------+--------+------------+--------+ ICA Mid   85      35                                   +----------+--------+--------+--------+------------+--------+ ICA Distal66  29                                   +----------+--------+--------+--------+------------+--------+ ECA       217     29                                    +----------+--------+--------+--------+------------+--------+ Portions of this table do not appear on this page. +----------+--------+-------+----------------+------------+           PSV cm/sEDV cmsDescribe        Arm Pressure +----------+--------+-------+----------------+------------+ Subclavian130            Multiphasic, WNL             +----------+--------+-------+----------------+------------+ +---------+--------+--+--------+--+---------+ VertebralPSV cm/s38EDV cm/s15Antegrade +---------+--------+--+--------+--+---------+ Left Carotid Findings: +----------+--------+--------+--------+---------------------------+--------+           PSV cm/sEDV cm/sStenosisDescribe                   Comments +----------+--------+--------+--------+---------------------------+--------+ CCA Prox  87      24                                                  +----------+--------+--------+--------+---------------------------+--------+ CCA Distal93      26                                                  +----------+--------+--------+--------+---------------------------+--------+ ICA Prox  97      28      1-39%   hypoechoic and heterogenous         +----------+--------+--------+--------+---------------------------+--------+ ICA Mid   78      35                                                  +----------+--------+--------+--------+---------------------------+--------+ ICA Distal74      29                                                  +----------+--------+--------+--------+---------------------------+--------+ ECA       97      14                                                  +----------+--------+--------+--------+---------------------------+--------+ +----------+--------+--------+--------+------------+ SubclavianPSV cm/sEDV cm/sDescribeArm Pressure +----------+--------+--------+--------+------------+           99                                    +----------+--------+--------+--------+------------+ +---------+--------+--+--------+--+ VertebralPSV cm/s38EDV cm/s19 +---------+--------+--+--------+--+  ABI Findings: +--------+------------------+-----+---------+--------+ Right   Rt Pressure (mmHg)IndexWaveform Comment  +--------+------------------+-----+---------+--------+ Brachial  triphasic         +--------+------------------+-----+---------+--------+ PTA     126               1.12 triphasic         +--------+------------------+-----+---------+--------+ DP      139               1.24 triphasic         +--------+------------------+-----+---------+--------+ +--------+------------------+-----+---------+-------+ Left    Lt Pressure (mmHg)IndexWaveform Comment +--------+------------------+-----+---------+-------+ [id number]                    triphasic        +--------+------------------+-----+---------+-------+ PTA     131               1.17 triphasic        +--------+------------------+-----+---------+-------+ DP      135               1.21 triphasic        +--------+------------------+-----+---------+-------+ +-------+---------------+----------------+ ABI/TBIToday's ABI/TBIPrevious ABI/TBI +-------+---------------+----------------+ Right  1.2                             +-------+---------------+----------------+ Left   1.2                             +-------+---------------+----------------+  Right Doppler Findings: +--------+--------+-----+---------+--------+ Site    PressureIndexDoppler  Comments +--------+--------+-----+---------+--------+ Brachial             triphasic         +--------+--------+-----+---------+--------+ Radial               triphasic         +--------+--------+-----+---------+--------+ Ulnar                triphasic         +--------+--------+-----+---------+--------+  Left Doppler Findings:  +--------+--------+-----+---------+--------+ Site    PressureIndexDoppler  Comments +--------+--------+-----+---------+--------+ [id number]          triphasic         +--------+--------+-----+---------+--------+ Radial               triphasic         +--------+--------+-----+---------+--------+ Ulnar                triphasic         +--------+--------+-----+---------+--------+  Summary: Right Carotid: Velocities in the right ICA are consistent with a 1-39% stenosis. Left Carotid: Velocities in the left ICA are consistent with a 1-39% stenosis. Vertebrals: Bilateral vertebral arteries demonstrate antegrade flow. Right ABI: Resting right ankle-brachial index is within normal range. No evidence of significant right lower extremity arterial disease. Left ABI: Resting left ankle-brachial index is within normal range. No evidence of significant left lower extremity arterial disease. Bilateral Extremity: Doppler waveforms remain within normal limits with compression bilaterally for the radial arteries. Doppler waveforms remain within normal limits with compression bilaterally for the ulnar arteries.  Electronically signed by Deitra Mayo MD on 06/08/2020 at 7:03:35 PM.    Final     Patient Profile     75 y.o. male awaiting mitral valve repair, CABG  Assessment & Plan    Severe mitral regurgitation Coronary artery disease Paroxysmal atrial fibrillation/flutter Complete heart block/pacemaker Cardiomyopathy- EF 40% in the setting of severe mitral regurg.  -Doing well currently awaiting surgery. -On full dose Lovenox.  Please hold appropriately prior to OR per CT surgery orders. -  Continuing with furosemide 40 mg a day.  Doing well.  Good overall diuresis.  Maintaining creatinine. -Answered further questions today.  For questions or updates, please contact [place] Please consult [email or web address] for contact info under        Signed, Candee Furbish, MD  06/09/2020, 9:49  AM

## 2020-06-10 ENCOUNTER — Encounter: Payer: PRIVATE HEALTH INSURANCE | Admitting: Thoracic Surgery (Cardiothoracic Vascular Surgery)

## 2020-06-10 ENCOUNTER — Inpatient Hospital Stay (HOSPITAL_COMMUNITY): Payer: Medicare Other | Admitting: Anesthesiology

## 2020-06-10 ENCOUNTER — Encounter (HOSPITAL_COMMUNITY): Payer: Self-pay | Admitting: Cardiology

## 2020-06-10 ENCOUNTER — Inpatient Hospital Stay (HOSPITAL_COMMUNITY): Payer: Medicare Other

## 2020-06-10 ENCOUNTER — Encounter (HOSPITAL_COMMUNITY)
Admission: EM | Disposition: A | Discharge status: Home / Self Care | Payer: Self-pay | Source: Home / Self Care | Attending: Cardiology

## 2020-06-10 DIAGNOSIS — I340 Nonrheumatic mitral (valve) insufficiency: Secondary | ICD-10-CM | POA: Diagnosis not present

## 2020-06-10 DIAGNOSIS — I2510 Atherosclerotic heart disease of native coronary artery without angina pectoris: Secondary | ICD-10-CM | POA: Diagnosis not present

## 2020-06-10 DIAGNOSIS — Z9889 Other specified postprocedural states: Secondary | ICD-10-CM

## 2020-06-10 DIAGNOSIS — Z8679 Personal history of other diseases of the circulatory system: Secondary | ICD-10-CM

## 2020-06-10 DIAGNOSIS — I480 Paroxysmal atrial fibrillation: Secondary | ICD-10-CM | POA: Diagnosis not present

## 2020-06-10 DIAGNOSIS — Z951 Presence of aortocoronary bypass graft: Secondary | ICD-10-CM

## 2020-06-10 HISTORY — PX: CLIPPING OF ATRIAL APPENDAGE: SHX5773

## 2020-06-10 HISTORY — PX: CORONARY ARTERY BYPASS GRAFT: SHX141

## 2020-06-10 HISTORY — DX: Other specified postprocedural states: Z98.890

## 2020-06-10 HISTORY — PX: MITRAL VALVE REPAIR: SHX2039

## 2020-06-10 HISTORY — DX: Personal history of other diseases of the circulatory system: Z86.79

## 2020-06-10 HISTORY — DX: Presence of aortocoronary bypass graft: Z95.1

## 2020-06-10 HISTORY — PX: MAZE: SHX5063

## 2020-06-10 HISTORY — PX: TEE WITHOUT CARDIOVERSION: SHX5443

## 2020-06-10 LAB — POCT I-STAT 7, (LYTES, BLD GAS, ICA,H+H)
Acid-Base Excess: 3 mmol/L — ABNORMAL HIGH (ref 0.0–2.0)
Acid-Base Excess: 1 mmol/L (ref 0.0–2.0)
Acid-Base Excess: 3 mmol/L — ABNORMAL HIGH (ref 0.0–2.0)
Acid-base deficit: 2 mmol/L (ref 0.0–2.0)
Acid-base deficit: 2 mmol/L (ref 0.0–2.0)
Acid-base deficit: 3 mmol/L — ABNORMAL HIGH (ref 0.0–2.0)
Bicarbonate: 29.9 mmol/L — ABNORMAL HIGH (ref 20.0–28.0)
Bicarbonate: 28 mmol/L (ref 20.0–28.0)
Bicarbonate: 27.9 mmol/L (ref 20.0–28.0)
Bicarbonate: 23.8 mmol/L (ref 20.0–28.0)
Bicarbonate: 24.2 mmol/L (ref 20.0–28.0)
Bicarbonate: 23.6 mmol/L (ref 20.0–28.0)
Calcium, Ion: 1.26 mmol/L (ref 1.15–1.40)
Calcium, Ion: 1.17 mmol/L (ref 1.15–1.40)
Calcium, Ion: 1.09 mmol/L — ABNORMAL LOW (ref 1.15–1.40)
Calcium, Ion: 1.09 mmol/L — ABNORMAL LOW (ref 1.15–1.40)
Calcium, Ion: 1.16 mmol/L (ref 1.15–1.40)
Calcium, Ion: 1.15 mmol/L (ref 1.15–1.40)
HCT: 39 % (ref 39.0–52.0)
HCT: 30 % — ABNORMAL LOW (ref 39.0–52.0)
HCT: 29 % — ABNORMAL LOW (ref 39.0–52.0)
HCT: 26 % — ABNORMAL LOW (ref 39.0–52.0)
HCT: 30 % — ABNORMAL LOW (ref 39.0–52.0)
HCT: 29 % — ABNORMAL LOW (ref 39.0–52.0)
Hemoglobin: 13.3 g/dL (ref 13.0–17.0)
Hemoglobin: 10.2 g/dL — ABNORMAL LOW (ref 13.0–17.0)
Hemoglobin: 9.9 g/dL — ABNORMAL LOW (ref 13.0–17.0)
Hemoglobin: 8.8 g/dL — ABNORMAL LOW (ref 13.0–17.0)
Hemoglobin: 10.2 g/dL — ABNORMAL LOW (ref 13.0–17.0)
Hemoglobin: 9.9 g/dL — ABNORMAL LOW (ref 13.0–17.0)
O2 Saturation: 100 %
O2 Saturation: 86 %
O2 Saturation: 100 %
O2 Saturation: 99 %
O2 Saturation: 98 %
O2 Saturation: 97 %
Patient temperature: 36.1
Patient temperature: 36.4
Potassium: 3.9 mmol/L (ref 3.5–5.1)
Potassium: 5.2 mmol/L — ABNORMAL HIGH (ref 3.5–5.1)
Potassium: 5.2 mmol/L — ABNORMAL HIGH (ref 3.5–5.1)
Potassium: 4.1 mmol/L (ref 3.5–5.1)
Potassium: 4 mmol/L (ref 3.5–5.1)
Potassium: 4 mmol/L (ref 3.5–5.1)
Sodium: 139 mmol/L (ref 135–145)
Sodium: 139 mmol/L (ref 135–145)
Sodium: 135 mmol/L (ref 135–145)
Sodium: 141 mmol/L (ref 135–145)
Sodium: 143 mmol/L (ref 135–145)
Sodium: 142 mmol/L (ref 135–145)
TCO2: 31 mmol/L (ref 22–32)
TCO2: 30 mmol/L (ref 22–32)
TCO2: 29 mmol/L (ref 22–32)
TCO2: 25 mmol/L (ref 22–32)
TCO2: 26 mmol/L (ref 22–32)
TCO2: 25 mmol/L (ref 22–32)
pCO2 arterial: 52.1 mmHg — ABNORMAL HIGH (ref 32.0–48.0)
pCO2 arterial: 56.6 mmHg — ABNORMAL HIGH (ref 32.0–48.0)
pCO2 arterial: 42.9 mmHg (ref 32.0–48.0)
pCO2 arterial: 42.8 mmHg (ref 32.0–48.0)
pCO2 arterial: 43.5 mmHg (ref 32.0–48.0)
pCO2 arterial: 47.4 mmHg (ref 32.0–48.0)
pH, Arterial: 7.366 (ref 7.350–7.450)
pH, Arterial: 7.303 — ABNORMAL LOW (ref 7.350–7.450)
pH, Arterial: 7.421 (ref 7.350–7.450)
pH, Arterial: 7.353 (ref 7.350–7.450)
pH, Arterial: 7.349 — ABNORMAL LOW (ref 7.350–7.450)
pH, Arterial: 7.303 — ABNORMAL LOW (ref 7.350–7.450)
pO2, Arterial: 486 mmHg — ABNORMAL HIGH (ref 83.0–108.0)
pO2, Arterial: 58 mmHg — ABNORMAL LOW (ref 83.0–108.0)
pO2, Arterial: 435 mmHg — ABNORMAL HIGH (ref 83.0–108.0)
pO2, Arterial: 132 mmHg — ABNORMAL HIGH (ref 83.0–108.0)
pO2, Arterial: 101 mmHg (ref 83.0–108.0)
pO2, Arterial: 103 mmHg (ref 83.0–108.0)

## 2020-06-10 LAB — POCT I-STAT, CHEM 8
BUN: 16 mg/dL (ref 8–23)
BUN: 15 mg/dL (ref 8–23)
BUN: 15 mg/dL (ref 8–23)
BUN: 14 mg/dL (ref 8–23)
BUN: 15 mg/dL (ref 8–23)
BUN: 14 mg/dL (ref 8–23)
BUN: 15 mg/dL (ref 8–23)
Calcium, Ion: 1.21 mmol/L (ref 1.15–1.40)
Calcium, Ion: 1.25 mmol/L (ref 1.15–1.40)
Calcium, Ion: 1.14 mmol/L — ABNORMAL LOW (ref 1.15–1.40)
Calcium, Ion: 1.11 mmol/L — ABNORMAL LOW (ref 1.15–1.40)
Calcium, Ion: 1.11 mmol/L — ABNORMAL LOW (ref 1.15–1.40)
Calcium, Ion: 1.06 mmol/L — ABNORMAL LOW (ref 1.15–1.40)
Calcium, Ion: 1.12 mmol/L — ABNORMAL LOW (ref 1.15–1.40)
Chloride: 102 mmol/L (ref 98–111)
Chloride: 102 mmol/L (ref 98–111)
Chloride: 102 mmol/L (ref 98–111)
Chloride: 102 mmol/L (ref 98–111)
Chloride: 105 mmol/L (ref 98–111)
Chloride: 104 mmol/L (ref 98–111)
Chloride: 105 mmol/L (ref 98–111)
Creatinine, Ser: 0.8 mg/dL (ref 0.61–1.24)
Creatinine, Ser: 0.8 mg/dL (ref 0.61–1.24)
Creatinine, Ser: 0.7 mg/dL (ref 0.61–1.24)
Creatinine, Ser: 0.7 mg/dL (ref 0.61–1.24)
Creatinine, Ser: 0.7 mg/dL (ref 0.61–1.24)
Creatinine, Ser: 0.7 mg/dL (ref 0.61–1.24)
Creatinine, Ser: 0.7 mg/dL (ref 0.61–1.24)
Glucose, Bld: 99 mg/dL (ref 70–99)
Glucose, Bld: 99 mg/dL (ref 70–99)
Glucose, Bld: 142 mg/dL — ABNORMAL HIGH (ref 70–99)
Glucose, Bld: 152 mg/dL — ABNORMAL HIGH (ref 70–99)
Glucose, Bld: 159 mg/dL — ABNORMAL HIGH (ref 70–99)
Glucose, Bld: 119 mg/dL — ABNORMAL HIGH (ref 70–99)
Glucose, Bld: 136 mg/dL — ABNORMAL HIGH (ref 70–99)
HCT: 41 % (ref 39.0–52.0)
HCT: 36 % — ABNORMAL LOW (ref 39.0–52.0)
HCT: 28 % — ABNORMAL LOW (ref 39.0–52.0)
HCT: 26 % — ABNORMAL LOW (ref 39.0–52.0)
HCT: 28 % — ABNORMAL LOW (ref 39.0–52.0)
HCT: 26 % — ABNORMAL LOW (ref 39.0–52.0)
HCT: 26 % — ABNORMAL LOW (ref 39.0–52.0)
Hemoglobin: 13.9 g/dL (ref 13.0–17.0)
Hemoglobin: 12.2 g/dL — ABNORMAL LOW (ref 13.0–17.0)
Hemoglobin: 9.5 g/dL — ABNORMAL LOW (ref 13.0–17.0)
Hemoglobin: 8.8 g/dL — ABNORMAL LOW (ref 13.0–17.0)
Hemoglobin: 9.5 g/dL — ABNORMAL LOW (ref 13.0–17.0)
Hemoglobin: 8.8 g/dL — ABNORMAL LOW (ref 13.0–17.0)
Hemoglobin: 8.8 g/dL — ABNORMAL LOW (ref 13.0–17.0)
Potassium: 4 mmol/L (ref 3.5–5.1)
Potassium: 4.4 mmol/L (ref 3.5–5.1)
Potassium: 5.3 mmol/L — ABNORMAL HIGH (ref 3.5–5.1)
Potassium: 4.9 mmol/L (ref 3.5–5.1)
Potassium: 5.3 mmol/L — ABNORMAL HIGH (ref 3.5–5.1)
Potassium: 4.1 mmol/L (ref 3.5–5.1)
Potassium: 5.2 mmol/L — ABNORMAL HIGH (ref 3.5–5.1)
Sodium: 141 mmol/L (ref 135–145)
Sodium: 138 mmol/L (ref 135–145)
Sodium: 136 mmol/L (ref 135–145)
Sodium: 137 mmol/L (ref 135–145)
Sodium: 137 mmol/L (ref 135–145)
Sodium: 139 mmol/L (ref 135–145)
Sodium: 140 mmol/L (ref 135–145)
TCO2: 28 mmol/L (ref 22–32)
TCO2: 29 mmol/L (ref 22–32)
TCO2: 29 mmol/L (ref 22–32)
TCO2: 25 mmol/L (ref 22–32)
TCO2: 25 mmol/L (ref 22–32)
TCO2: 23 mmol/L (ref 22–32)
TCO2: 25 mmol/L (ref 22–32)

## 2020-06-10 LAB — GLUCOSE, CAPILLARY
Glucose-Capillary: 87 mg/dL (ref 70–99)
Glucose-Capillary: 85 mg/dL (ref 70–99)
Glucose-Capillary: 142 mg/dL — ABNORMAL HIGH (ref 70–99)
Glucose-Capillary: 112 mg/dL — ABNORMAL HIGH (ref 70–99)
Glucose-Capillary: 125 mg/dL — ABNORMAL HIGH (ref 70–99)
Glucose-Capillary: 127 mg/dL — ABNORMAL HIGH (ref 70–99)
Glucose-Capillary: 111 mg/dL — ABNORMAL HIGH (ref 70–99)
Glucose-Capillary: 122 mg/dL — ABNORMAL HIGH (ref 70–99)

## 2020-06-10 LAB — MAGNESIUM: Magnesium: 2.7 mg/dL — ABNORMAL HIGH (ref 1.7–2.4)

## 2020-06-10 LAB — CBC
HCT: 43.7 % (ref 39.0–52.0)
HCT: 32 % — ABNORMAL LOW (ref 39.0–52.0)
HCT: 29 % — ABNORMAL LOW (ref 39.0–52.0)
Hemoglobin: 14.9 g/dL (ref 13.0–17.0)
Hemoglobin: 10.8 g/dL — ABNORMAL LOW (ref 13.0–17.0)
Hemoglobin: 9.7 g/dL — ABNORMAL LOW (ref 13.0–17.0)
MCH: 32.4 pg (ref 26.0–34.0)
MCH: 32.6 pg (ref 26.0–34.0)
MCH: 32.2 pg (ref 26.0–34.0)
MCHC: 34.1 g/dL (ref 30.0–36.0)
MCHC: 33.8 g/dL (ref 30.0–36.0)
MCHC: 33.4 g/dL (ref 30.0–36.0)
MCV: 95 fL (ref 80.0–100.0)
MCV: 96.7 fL (ref 80.0–100.0)
MCV: 96.3 fL (ref 80.0–100.0)
Platelets: 248 10*3/uL (ref 150–400)
Platelets: 146 10*3/uL — ABNORMAL LOW (ref 150–400)
Platelets: 143 10*3/uL — ABNORMAL LOW (ref 150–400)
RBC: 4.6 MIL/uL (ref 4.22–5.81)
RBC: 3.31 MIL/uL — ABNORMAL LOW (ref 4.22–5.81)
RBC: 3.01 MIL/uL — ABNORMAL LOW (ref 4.22–5.81)
RDW: 12.8 % (ref 11.5–15.5)
RDW: 12.7 % (ref 11.5–15.5)
RDW: 12.7 % (ref 11.5–15.5)
WBC: 6.4 10*3/uL (ref 4.0–10.5)
WBC: 14.7 10*3/uL — ABNORMAL HIGH (ref 4.0–10.5)
WBC: 13.3 10*3/uL — ABNORMAL HIGH (ref 4.0–10.5)
nRBC: 0 % (ref 0.0–0.2)
nRBC: 0 % (ref 0.0–0.2)
nRBC: 0 % (ref 0.0–0.2)

## 2020-06-10 LAB — BASIC METABOLIC PANEL
Anion gap: 11 (ref 5–15)
Anion gap: 5 (ref 5–15)
BUN: 13 mg/dL (ref 8–23)
BUN: 14 mg/dL (ref 8–23)
CO2: 24 mmol/L (ref 22–32)
CO2: 23 mmol/L (ref 22–32)
Calcium: 9.1 mg/dL (ref 8.9–10.3)
Calcium: 7.2 mg/dL — ABNORMAL LOW (ref 8.9–10.3)
Chloride: 103 mmol/L (ref 98–111)
Chloride: 108 mmol/L (ref 98–111)
Creatinine, Ser: 0.88 mg/dL (ref 0.61–1.24)
Creatinine, Ser: 0.81 mg/dL (ref 0.61–1.24)
GFR, Estimated: >60 mL/min (ref >60)
GFR, Estimated: >60 mL/min (ref >60)
Glucose, Bld: 100 mg/dL — ABNORMAL HIGH (ref 70–99)
Glucose, Bld: 151 mg/dL — ABNORMAL HIGH (ref 70–99)
Potassium: 3.8 mmol/L (ref 3.5–5.1)
Potassium: 3.6 mmol/L (ref 3.5–5.1)
Sodium: 138 mmol/L (ref 135–145)
Sodium: 136 mmol/L (ref 135–145)

## 2020-06-10 LAB — ECHO INTRAOPERATIVE TEE
Height: 71 in
Weight: 2504.43 oz

## 2020-06-10 LAB — HEMOGLOBIN AND HEMATOCRIT, BLOOD
HCT: 28.5 % — ABNORMAL LOW (ref 39.0–52.0)
Hemoglobin: 9.7 g/dL — ABNORMAL LOW (ref 13.0–17.0)

## 2020-06-10 LAB — PROTIME-INR
INR: 1.4 — ABNORMAL HIGH (ref 0.8–1.2)
Prothrombin Time: 17.4 seconds — ABNORMAL HIGH (ref 11.4–15.2)

## 2020-06-10 LAB — PLATELET COUNT: Platelets: 136 10*3/uL — ABNORMAL LOW (ref 150–400)

## 2020-06-10 LAB — APTT: aPTT: 35 seconds (ref 24–36)

## 2020-06-10 SURGERY — REPAIR, MITRAL VALVE
Anesthesia: General | Site: Chest

## 2020-06-10 MED ORDER — BISACODYL 10 MG RE SUPP
10.0000 mg | Freq: Every day | RECTAL | Status: DC
  Administered 2020-06-14: 10 mg via RECTAL
  Filled 2020-06-10: qty 1

## 2020-06-10 MED ORDER — SODIUM CHLORIDE 0.9 % IV SOLN: INTRAVENOUS | Status: DC

## 2020-06-10 MED ORDER — SODIUM CHLORIDE 0.9% FLUSH
3.0000 mL | INTRAVENOUS | Status: DC | PRN
  Administered 2020-06-12: 3 mL via INTRAVENOUS

## 2020-06-10 MED ORDER — SODIUM CHLORIDE 0.9 % IV SOLN
1.5000 g | Freq: Two times a day (BID) | INTRAVENOUS | Status: AC
  Administered 2020-06-10 – 2020-06-12 (×4): 1.5 g via INTRAVENOUS
  Filled 2020-06-10 (×4): qty 1.5

## 2020-06-10 MED ORDER — ROCURONIUM BROMIDE 10 MG/ML (PF) SYRINGE
PREFILLED_SYRINGE | INTRAVENOUS | Status: AC
  Filled 2020-06-10: qty 10

## 2020-06-10 MED ORDER — PHENYLEPHRINE 40 MCG/ML (10ML) SYRINGE FOR IV PUSH (FOR BLOOD PRESSURE SUPPORT)
PREFILLED_SYRINGE | INTRAVENOUS | Status: AC
  Filled 2020-06-10: qty 10

## 2020-06-10 MED ORDER — ORAL CARE MOUTH RINSE
15.0000 mL | Freq: Two times a day (BID) | OROMUCOSAL | Status: DC
  Administered 2020-06-11 (×2): 15 mL via OROMUCOSAL

## 2020-06-10 MED ORDER — SODIUM CHLORIDE 0.9% FLUSH
3.0000 mL | Freq: Two times a day (BID) | INTRAVENOUS | Status: DC
  Administered 2020-06-11 – 2020-06-13 (×4): 3 mL via INTRAVENOUS

## 2020-06-10 MED ORDER — SODIUM CHLORIDE 0.9% FLUSH: 10.0000 mL | INTRAVENOUS | Status: DC | PRN

## 2020-06-10 MED ORDER — LACTATED RINGERS IV SOLN: INTRAVENOUS | Status: DC

## 2020-06-10 MED ORDER — MAGNESIUM SULFATE 4 GM/100ML IV SOLN
4.0000 g | Freq: Once | INTRAVENOUS | Status: AC
  Administered 2020-06-10: 4 g via INTRAVENOUS
  Filled 2020-06-10: qty 100

## 2020-06-10 MED ORDER — BISACODYL 5 MG PO TBEC
10.0000 mg | DELAYED_RELEASE_TABLET | Freq: Every day | ORAL | Status: DC
  Administered 2020-06-11 – 2020-06-13 (×3): 10 mg via ORAL
  Filled 2020-06-10 (×7): qty 2

## 2020-06-10 MED ORDER — DOCUSATE SODIUM 100 MG PO CAPS
200.0000 mg | ORAL_CAPSULE | Freq: Every day | ORAL | Status: DC
  Administered 2020-06-11 – 2020-06-14 (×4): 200 mg via ORAL
  Filled 2020-06-10 (×6): qty 2

## 2020-06-10 MED ORDER — POTASSIUM CHLORIDE 20 MEQ PO PACK
40.0000 meq | PACK | Freq: Once | ORAL | Status: AC
  Administered 2020-06-10: 40 meq via ORAL
  Filled 2020-06-10: qty 2

## 2020-06-10 MED ORDER — PLASMA-LYTE 148 IV SOLN
INTRAVENOUS | Status: DC | PRN
  Administered 2020-06-10: 500 mL via INTRAVASCULAR

## 2020-06-10 MED ORDER — CHLORHEXIDINE GLUCONATE 0.12 % MT SOLN
15.0000 mL | OROMUCOSAL | Status: AC
  Administered 2020-06-10: 15 mL via OROMUCOSAL

## 2020-06-10 MED ORDER — PHENYLEPHRINE HCL-NACL 20-0.9 MG/250ML-% IV SOLN: 0.0000 ug/min | INTRAVENOUS | Status: DC

## 2020-06-10 MED ORDER — FENTANYL CITRATE (PF) 250 MCG/5ML IJ SOLN
INTRAMUSCULAR | Status: DC | PRN
  Administered 2020-06-10: 200 ug via INTRAVENOUS
  Administered 2020-06-10: 100 ug via INTRAVENOUS
  Administered 2020-06-10: 150 ug via INTRAVENOUS
  Administered 2020-06-10: 50 ug via INTRAVENOUS
  Administered 2020-06-10 (×2): 100 ug via INTRAVENOUS
  Administered 2020-06-10: 150 ug via INTRAVENOUS
  Administered 2020-06-10: 100 ug via INTRAVENOUS
  Administered 2020-06-10: 50 ug via INTRAVENOUS

## 2020-06-10 MED ORDER — FENTANYL CITRATE (PF) 100 MCG/2ML IJ SOLN
12.5000 ug | INTRAMUSCULAR | Status: DC | PRN
  Administered 2020-06-10 – 2020-06-12 (×8): 12.5 ug via INTRAVENOUS
  Filled 2020-06-10 (×9): qty 2

## 2020-06-10 MED ORDER — SODIUM CHLORIDE (PF) 0.9 % IJ SOLN
OROMUCOSAL | Status: DC | PRN
  Administered 2020-06-10 (×7): 4 mL via TOPICAL

## 2020-06-10 MED ORDER — KETOROLAC TROMETHAMINE 15 MG/ML IJ SOLN
15.0000 mg | Freq: Once | INTRAMUSCULAR | Status: AC
  Administered 2020-06-10: 15 mg via INTRAVENOUS
  Filled 2020-06-10: qty 1

## 2020-06-10 MED ORDER — ONDANSETRON HCL 4 MG/2ML IJ SOLN
4.0000 mg | Freq: Four times a day (QID) | INTRAMUSCULAR | Status: DC | PRN
  Administered 2020-06-11 – 2020-06-13 (×5): 4 mg via INTRAVENOUS
  Filled 2020-06-10 (×6): qty 2

## 2020-06-10 MED ORDER — MORPHINE SULFATE (PF) 2 MG/ML IV SOLN
1.0000 mg | INTRAVENOUS | Status: DC | PRN
  Administered 2020-06-10 (×3): 4 mg via INTRAVENOUS
  Filled 2020-06-10 (×3): qty 2

## 2020-06-10 MED ORDER — ROCURONIUM BROMIDE 10 MG/ML (PF) SYRINGE
PREFILLED_SYRINGE | INTRAVENOUS | Status: AC
  Filled 2020-06-10: qty 20

## 2020-06-10 MED ORDER — SODIUM CHLORIDE 0.9 % IR SOLN
Status: DC | PRN
  Administered 2020-06-10: 4000 mL

## 2020-06-10 MED ORDER — OXYCODONE HCL 5 MG PO TABS
5.0000 mg | ORAL_TABLET | ORAL | Status: DC | PRN
  Administered 2020-06-10 – 2020-06-11 (×2): 10 mg via ORAL
  Administered 2020-06-12: 5 mg via ORAL
  Filled 2020-06-10: qty 2
  Filled 2020-06-10: qty 1
  Filled 2020-06-10: qty 2

## 2020-06-10 MED ORDER — PANTOPRAZOLE SODIUM 40 MG PO TBEC
40.0000 mg | DELAYED_RELEASE_TABLET | Freq: Every day | ORAL | Status: DC
  Administered 2020-06-12 – 2020-06-17 (×6): 40 mg via ORAL
  Filled 2020-06-10 (×6): qty 1

## 2020-06-10 MED ORDER — PROTAMINE SULFATE 10 MG/ML IV SOLN
INTRAVENOUS | Status: AC
  Filled 2020-06-10: qty 25

## 2020-06-10 MED ORDER — ACETAMINOPHEN 500 MG PO TABS
1000.0000 mg | ORAL_TABLET | Freq: Four times a day (QID) | ORAL | Status: AC
  Administered 2020-06-11 – 2020-06-13 (×10): 1000 mg via ORAL
  Filled 2020-06-10 (×14): qty 2

## 2020-06-10 MED ORDER — DEXMEDETOMIDINE HCL IN NACL 400 MCG/100ML IV SOLN: 0.0000 ug/kg/h | INTRAVENOUS | Status: DC

## 2020-06-10 MED ORDER — LACTATED RINGERS IV SOLN: 500.0000 mL | Freq: Once | INTRAVENOUS | Status: DC | PRN

## 2020-06-10 MED ORDER — PROTAMINE SULFATE 10 MG/ML IV SOLN
INTRAVENOUS | Status: DC | PRN
  Administered 2020-06-10: 250 mg via INTRAVENOUS

## 2020-06-10 MED ORDER — INSULIN REGULAR(HUMAN) IN NACL 100-0.9 UT/100ML-% IV SOLN: INTRAVENOUS | Status: DC

## 2020-06-10 MED ORDER — FAMOTIDINE IN NACL 20-0.9 MG/50ML-% IV SOLN
20.0000 mg | Freq: Two times a day (BID) | INTRAVENOUS | Status: AC
  Administered 2020-06-10 (×2): 20 mg via INTRAVENOUS
  Filled 2020-06-10 (×3): qty 50

## 2020-06-10 MED ORDER — TRAMADOL HCL 50 MG PO TABS
50.0000 mg | ORAL_TABLET | ORAL | Status: DC | PRN
  Administered 2020-06-10 – 2020-06-16 (×6): 100 mg via ORAL
  Administered 2020-06-16: 50 mg via ORAL
  Filled 2020-06-10 (×3): qty 2
  Filled 2020-06-10: qty 1
  Filled 2020-06-10 (×3): qty 2
  Filled 2020-06-10: qty 1
  Filled 2020-06-10: qty 2

## 2020-06-10 MED ORDER — LACTATED RINGERS IV SOLN: INTRAVENOUS | Status: DC | PRN

## 2020-06-10 MED ORDER — ACETAMINOPHEN 650 MG RE SUPP
650.0000 mg | Freq: Once | RECTAL | Status: AC
  Administered 2020-06-10: 650 mg via RECTAL

## 2020-06-10 MED ORDER — DEXTROSE 50 % IV SOLN: 0.0000 mL | INTRAVENOUS | Status: DC | PRN

## 2020-06-10 MED ORDER — MIDAZOLAM HCL 5 MG/5ML IJ SOLN
INTRAMUSCULAR | Status: DC | PRN
  Administered 2020-06-10 (×3): 1 mg via INTRAVENOUS
  Administered 2020-06-10: 3 mg via INTRAVENOUS
  Administered 2020-06-10 (×4): 1 mg via INTRAVENOUS

## 2020-06-10 MED ORDER — ARTIFICIAL TEARS OPHTHALMIC OINT
TOPICAL_OINTMENT | OPHTHALMIC | Status: AC
  Filled 2020-06-10: qty 3.5

## 2020-06-10 MED ORDER — HEPARIN SODIUM (PORCINE) 1000 UNIT/ML IJ SOLN
INTRAMUSCULAR | Status: DC | PRN
  Administered 2020-06-10: 25000 [IU] via INTRAVENOUS

## 2020-06-10 MED ORDER — HEPARIN SODIUM (PORCINE) 1000 UNIT/ML IJ SOLN
INTRAMUSCULAR | Status: AC
  Filled 2020-06-10: qty 1

## 2020-06-10 MED ORDER — SODIUM CHLORIDE 0.9% FLUSH
10.0000 mL | Freq: Two times a day (BID) | INTRAVENOUS | Status: DC
Start: 2020-06-10 — End: 2020-06-14
  Administered 2020-06-11 – 2020-06-13 (×4): 10 mL

## 2020-06-10 MED ORDER — HEMOSTATIC AGENTS (NO CHARGE) OPTIME
TOPICAL | Status: DC | PRN
  Administered 2020-06-10: 1 via TOPICAL

## 2020-06-10 MED ORDER — ALBUMIN HUMAN 5 % IV SOLN: INTRAVENOUS | Status: DC | PRN

## 2020-06-10 MED ORDER — SODIUM CHLORIDE 0.9 % IV SOLN: INTRAVENOUS | Status: DC | PRN

## 2020-06-10 MED ORDER — LIDOCAINE 2% (20 MG/ML) 5 ML SYRINGE
INTRAMUSCULAR | Status: AC
  Filled 2020-06-10: qty 5

## 2020-06-10 MED ORDER — VANCOMYCIN HCL 1000 MG IV SOLR
INTRAVENOUS | Status: DC | PRN
  Administered 2020-06-10: 1000 mL

## 2020-06-10 MED ORDER — FENTANYL CITRATE (PF) 250 MCG/5ML IJ SOLN
INTRAMUSCULAR | Status: AC
  Filled 2020-06-10: qty 25

## 2020-06-10 MED ORDER — ALBUMIN HUMAN 5 % IV SOLN
250.0000 mL | INTRAVENOUS | Status: DC | PRN
  Administered 2020-06-10 (×3): 12.5 g via INTRAVENOUS
  Filled 2020-06-10: qty 250

## 2020-06-10 MED ORDER — ORAL CARE MOUTH RINSE
15.0000 mL | OROMUCOSAL | Status: DC
  Administered 2020-06-10 (×2): 15 mL via OROMUCOSAL

## 2020-06-10 MED ORDER — PROPOFOL 10 MG/ML IV BOLUS
INTRAVENOUS | Status: AC
  Filled 2020-06-10: qty 20

## 2020-06-10 MED ORDER — SODIUM CHLORIDE (PF) 0.9 % IJ SOLN
INTRAMUSCULAR | Status: AC
  Filled 2020-06-10: qty 10

## 2020-06-10 MED ORDER — PHENYLEPHRINE 40 MCG/ML (10ML) SYRINGE FOR IV PUSH (FOR BLOOD PRESSURE SUPPORT)
PREFILLED_SYRINGE | INTRAVENOUS | Status: DC | PRN
  Administered 2020-06-10 (×4): 80 ug via INTRAVENOUS

## 2020-06-10 MED ORDER — VANCOMYCIN HCL IN DEXTROSE 1-5 GM/200ML-% IV SOLN
1000.0000 mg | Freq: Once | INTRAVENOUS | Status: AC
  Administered 2020-06-10: 1000 mg via INTRAVENOUS
  Filled 2020-06-10: qty 200

## 2020-06-10 MED ORDER — SODIUM CHLORIDE 0.45 % IV SOLN: INTRAVENOUS | Status: DC | PRN

## 2020-06-10 MED ORDER — ASPIRIN 81 MG PO CHEW: 324.0000 mg | CHEWABLE_TABLET | Freq: Every day | ORAL | Status: DC

## 2020-06-10 MED ORDER — ROCURONIUM BROMIDE 10 MG/ML (PF) SYRINGE
PREFILLED_SYRINGE | INTRAVENOUS | Status: DC | PRN
  Administered 2020-06-10 (×5): 50 mg via INTRAVENOUS

## 2020-06-10 MED ORDER — ACETAMINOPHEN 160 MG/5ML PO SOLN: 1000.0000 mg | Freq: Four times a day (QID) | ORAL | Status: DC

## 2020-06-10 MED ORDER — NOREPINEPHRINE 4 MG/250ML-% IV SOLN: 0.0000 ug/min | INTRAVENOUS | Status: DC

## 2020-06-10 MED ORDER — SODIUM CHLORIDE 0.9 % IV SOLN
250.0000 mL | INTRAVENOUS | Status: DC
  Administered 2020-06-11: 250 mL via INTRAVENOUS

## 2020-06-10 MED ORDER — CHLORHEXIDINE GLUCONATE CLOTH 2 % EX PADS
6.0000 | MEDICATED_PAD | Freq: Every day | CUTANEOUS | Status: DC
  Administered 2020-06-10 – 2020-06-13 (×4): 6 via TOPICAL

## 2020-06-10 MED ORDER — POTASSIUM CHLORIDE 10 MEQ/50ML IV SOLN: 10.0000 meq | INTRAVENOUS | Status: AC

## 2020-06-10 MED ORDER — PROPOFOL 10 MG/ML IV BOLUS
INTRAVENOUS | Status: DC | PRN
  Administered 2020-06-10: 70 mg via INTRAVENOUS

## 2020-06-10 MED ORDER — CALCIUM CHLORIDE 10 % IV SOLN
INTRAVENOUS | Status: DC | PRN
  Administered 2020-06-10: 200 mg via INTRAVENOUS

## 2020-06-10 MED ORDER — ACETAMINOPHEN 160 MG/5ML PO SOLN: 650.0000 mg | Freq: Once | ORAL | Status: AC

## 2020-06-10 MED ORDER — MIDAZOLAM HCL (PF) 10 MG/2ML IJ SOLN
INTRAMUSCULAR | Status: AC
  Filled 2020-06-10: qty 2

## 2020-06-10 MED ORDER — ASPIRIN EC 325 MG PO TBEC: 325.0000 mg | DELAYED_RELEASE_TABLET | Freq: Every day | ORAL | Status: DC

## 2020-06-10 MED ORDER — MILRINONE LACTATE IN DEXTROSE 20-5 MG/100ML-% IV SOLN
0.0000 ug/kg/min | INTRAVENOUS | Status: DC
  Administered 2020-06-11: 0.25 ug/kg/min via INTRAVENOUS
  Administered 2020-06-12: 0.125 ug/kg/min via INTRAVENOUS
  Filled 2020-06-10 (×3): qty 100

## 2020-06-10 MED ORDER — CHLORHEXIDINE GLUCONATE 0.12% ORAL RINSE (MEDLINE KIT): 15.0000 mL | Freq: Two times a day (BID) | OROMUCOSAL | Status: DC

## 2020-06-10 MED ORDER — MIDAZOLAM HCL 2 MG/2ML IJ SOLN: 2.0000 mg | INTRAMUSCULAR | Status: DC | PRN

## 2020-06-10 MED ORDER — CHLORHEXIDINE GLUCONATE 0.12 % MT SOLN
15.0000 mL | Freq: Two times a day (BID) | OROMUCOSAL | Status: DC
  Administered 2020-06-10 – 2020-06-16 (×8): 15 mL via OROMUCOSAL
  Filled 2020-06-10 (×13): qty 15

## 2020-06-10 MED ORDER — NITROGLYCERIN IN D5W 200-5 MCG/ML-% IV SOLN: 0.0000 ug/min | INTRAVENOUS | Status: DC

## 2020-06-10 MED ORDER — CALCIUM CHLORIDE 10 % IV SOLN
INTRAVENOUS | Status: AC
  Filled 2020-06-10: qty 10

## 2020-06-10 SURGICAL SUPPLY — 148 items
ADAPTER CARDIO PERF ANTE/RETRO (ADAPTER) ×4 IMPLANT
ARTICLIP LAA PROCLIP II 45 (Clip) ×4 IMPLANT
BAG DECANTER FOR FLEXI CONT (MISCELLANEOUS) ×12 IMPLANT
BLADE CLIPPER SURG (BLADE) ×4 IMPLANT
BLADE STERNUM SYSTEM 6 (BLADE) ×8 IMPLANT
BLADE SURG 11 STRL SS (BLADE) ×8 IMPLANT
BNDG ELASTIC 4X5.8 VLCR STR LF (GAUZE/BANDAGES/DRESSINGS) ×8 IMPLANT
BNDG ELASTIC 6X10 VLCR STRL LF (GAUZE/BANDAGES/DRESSINGS) ×8 IMPLANT
BNDG ELASTIC 6X5.8 VLCR STR LF (GAUZE/BANDAGES/DRESSINGS) ×4 IMPLANT
BNDG GAUZE ELAST 4 BULKY (GAUZE/BANDAGES/DRESSINGS) ×8 IMPLANT
CANISTER SUCT 3000ML PPV (MISCELLANEOUS) ×8 IMPLANT
CANNULA EZ GLIDE AORTIC 21FR (CANNULA) ×8 IMPLANT
CANNULA FEM VENOUS REMOTE 22FR (CANNULA) ×4 IMPLANT
CANNULA GUNDRY RCSP 15FR (MISCELLANEOUS) ×4 IMPLANT
CANNULA SUMP PERICARDIAL (CANNULA) ×8 IMPLANT
CANNULA VENNOUS METAL TIP 20FR (CANNULA) ×4 IMPLANT
CATH CPB KIT OWEN (MISCELLANEOUS) ×4 IMPLANT
CATH THORACIC 36FR (CATHETERS) ×4 IMPLANT
CLAMP OLL ABLATION (MISCELLANEOUS) ×4 IMPLANT
CLIP FOGARTY SPRING 6M (CLIP) IMPLANT
CLIP RETRACTION 3.0MM CORONARY (MISCELLANEOUS) ×4 IMPLANT
CLIP VESOCCLUDE MED 24/CT (CLIP) IMPLANT
CLIP VESOCCLUDE SM WIDE 24/CT (CLIP) ×4 IMPLANT
CLOSURE PERCLOSE PROSTYLE (VASCULAR PRODUCTS) ×12 IMPLANT
CONN 1/2X1/2X1/2  BEN (MISCELLANEOUS) ×8
CONN 1/2X1/2X1/2 BEN (MISCELLANEOUS) ×6 IMPLANT
CONN 3/8X1/2 ST GISH (MISCELLANEOUS) ×12 IMPLANT
CONN ST 1/4X3/8  BEN (MISCELLANEOUS) ×12
CONN ST 1/4X3/8 BEN (MISCELLANEOUS) ×9 IMPLANT
COVER PROBE W GEL 5X96 (DRAPES) ×4 IMPLANT
COVER SURGICAL LIGHT HANDLE (MISCELLANEOUS) ×4 IMPLANT
DEFOGGER ANTIFOG KIT (MISCELLANEOUS) ×8 IMPLANT
DERMABOND ADVANCED (GAUZE/BANDAGES/DRESSINGS) ×1
DERMABOND ADVANCED .7 DNX12 (GAUZE/BANDAGES/DRESSINGS) ×3 IMPLANT
DEVICE ATRICLIP LAA PRCLPII 45 (Clip) ×3 IMPLANT
DEVICE CLOSURE PERCLS PRGLD 6F (VASCULAR PRODUCTS) IMPLANT
DEVICE SUT CK QUICK LOAD INDV (Prosthesis & Implant Heart) ×16 IMPLANT
DEVICE SUT CK QUICK LOAD MINI (Prosthesis & Implant Heart) ×4 IMPLANT
DRAIN CHANNEL 32F RND 10.7 FF (WOUND CARE) ×16 IMPLANT
DRAPE CARDIOVASCULAR INCISE (DRAPES) ×4
DRAPE CV SPLIT W-CLR ANES SCRN (DRAPES) IMPLANT
DRAPE INCISE IOBAN 66X45 STRL (DRAPES) ×8 IMPLANT
DRAPE PERI GROIN 82X75IN TIB (DRAPES) IMPLANT
DRAPE SLUSH/WARMER DISC (DRAPES) ×4 IMPLANT
DRAPE SRG 135X102X78XABS (DRAPES) ×3 IMPLANT
DRSG AQUACEL AG ADV 3.5X14 (GAUZE/BANDAGES/DRESSINGS) ×4 IMPLANT
ELECT BLADE 4.0 EZ CLEAN MEGAD (MISCELLANEOUS) ×4
ELECT REM PT RETURN 9FT ADLT (ELECTROSURGICAL) ×16
ELECTRODE BLDE 4.0 EZ CLN MEGD (MISCELLANEOUS) ×3 IMPLANT
ELECTRODE REM PT RTRN 9FT ADLT (ELECTROSURGICAL) ×12 IMPLANT
FELT TEFLON 1X6 (MISCELLANEOUS) ×16 IMPLANT
FIBERTAPE STERNAL CLSR 2 36IN (SUTURE) ×16 IMPLANT
FIBERTAPE STERNAL CLSR 2X36 (SUTURE) ×16 IMPLANT
GAUZE SPONGE 4X4 12PLY STRL (GAUZE/BANDAGES/DRESSINGS) ×12 IMPLANT
GLOVE ORTHO TXT STRL SZ7.5 (GLOVE) ×8 IMPLANT
GLOVE SRG 8 PF TXTR STRL LF DI (GLOVE) ×3 IMPLANT
GLOVE SURG MICRO LTX SZ6.5 (GLOVE) ×32 IMPLANT
GLOVE SURG UNDER POLY LF SZ8 (GLOVE) ×4
GOWN STRL REUS W/ TWL LRG LVL3 (GOWN DISPOSABLE) ×36 IMPLANT
GOWN STRL REUS W/TWL LRG LVL3 (GOWN DISPOSABLE) ×48
HEMOSTAT POWDER SURGIFOAM 1G (HEMOSTASIS) ×20 IMPLANT
INSERT FOGARTY XLG (MISCELLANEOUS) ×4 IMPLANT
IV NS IRRIG 3000ML ARTHROMATIC (IV SOLUTION) ×4 IMPLANT
KIT BASIN OR (CUSTOM PROCEDURE TRAY) ×8 IMPLANT
KIT DILATOR VASC 18G NDL (KITS) ×4 IMPLANT
KIT DRAINAGE VACCUM ASSIST (KITS) ×4 IMPLANT
KIT SUCTION CATH 14FR (SUCTIONS) ×28 IMPLANT
KIT SUT CK MINI COMBO 4X17 (Prosthesis & Implant Heart) ×4 IMPLANT
KIT TURNOVER KIT B (KITS) ×8 IMPLANT
KIT VASOVIEW HEMOPRO 2 VH 4000 (KITS) ×4 IMPLANT
LEAD PACING MYOCARDI (MISCELLANEOUS) ×4 IMPLANT
LINE VENT (MISCELLANEOUS) ×4 IMPLANT
LOOP VESSEL SUPERMAXI WHITE (MISCELLANEOUS) ×8 IMPLANT
MARKER GRAFT CORONARY BYPASS (MISCELLANEOUS) ×12 IMPLANT
NDL SUT PASSING CERCLAGE MED (SUTURE) ×8
NEEDLE SUT PASSING CERCLAG MED (SUTURE) ×6 IMPLANT
NS IRRIG 1000ML POUR BTL (IV SOLUTION) ×36 IMPLANT
PACK E OPEN HEART (SUTURE) ×8 IMPLANT
PACK OPEN HEART (CUSTOM PROCEDURE TRAY) ×8 IMPLANT
PAD ARMBOARD 7.5X6 YLW CONV (MISCELLANEOUS) ×16 IMPLANT
PAD ELECT DEFIB RADIOL ZOLL (MISCELLANEOUS) ×4 IMPLANT
PENCIL BUTTON HOLSTER BLD 10FT (ELECTRODE) ×4 IMPLANT
PERCLOSE PROGLIDE 6F (VASCULAR PRODUCTS)
POSITIONER HEAD DONUT 9IN (MISCELLANEOUS) ×8 IMPLANT
PROBE CRYO2-ABLATION MALLABLE (MISCELLANEOUS) ×4 IMPLANT
PUNCH AORTIC ROTATE 4.0MM (MISCELLANEOUS) ×4 IMPLANT
PUNCH AORTIC ROTATE 4.5MM 8IN (MISCELLANEOUS) IMPLANT
PUNCH AORTIC ROTATE 5MM 8IN (MISCELLANEOUS) IMPLANT
RING ANLPLS CARP-MC-ADAMS 28 (Prosthesis & Implant Heart) ×3 IMPLANT
RING ANNULOPLASTY (Prosthesis & Implant Heart) ×4 IMPLANT
SET CARDIOPLEGIA MPS 5001102 (MISCELLANEOUS) ×4 IMPLANT
SET IRRIG TUBING LAPAROSCOPIC (IRRIGATION / IRRIGATOR) ×8 IMPLANT
SHEATH PINNACLE 8F 10CM (SHEATH) ×4 IMPLANT
SOL ANTI FOG 6CC (MISCELLANEOUS) IMPLANT
SOLUTION ANTI FOG 6CC (MISCELLANEOUS)
SPONGE LAP 18X18 RF (DISPOSABLE) IMPLANT
SPONGE LAP 18X18 X RAY DECT (DISPOSABLE) ×8 IMPLANT
SPONGE LAP 4X18 RFD (DISPOSABLE) IMPLANT
SUPPORT HEART JANKE-BARRON (MISCELLANEOUS) ×4 IMPLANT
SUT BONE WAX W31G (SUTURE) ×8 IMPLANT
SUT EB EXC GRN/WHT 2-0 D/A SH (SUTURE) ×4
SUT ETHIBOND (SUTURE) ×8 IMPLANT
SUT ETHIBOND 2 0 SH (SUTURE) ×8
SUT ETHIBOND 2 0 SH 36X2 (SUTURE) ×6 IMPLANT
SUT ETHIBOND 2-0 RB-1 WHT (SUTURE) ×8 IMPLANT
SUT ETHIBOND X763 2 0 SH 1 (SUTURE) ×20 IMPLANT
SUT MNCRL AB 3-0 PS2 18 (SUTURE) ×16 IMPLANT
SUT MNCRL AB 4-0 PS2 18 (SUTURE) ×4 IMPLANT
SUT PDS AB 1 CTX 36 (SUTURE) ×16 IMPLANT
SUT PROLENE 2 0 SH DA (SUTURE) IMPLANT
SUT PROLENE 3 0 SH 1 (SUTURE) ×4 IMPLANT
SUT PROLENE 3 0 SH DA (SUTURE) ×12 IMPLANT
SUT PROLENE 3 0 SH1 36 (SUTURE) IMPLANT
SUT PROLENE 4 0 RB 1 (SUTURE) ×12
SUT PROLENE 4 0 SH DA (SUTURE) ×12 IMPLANT
SUT PROLENE 4-0 RB1 .5 CRCL 36 (SUTURE) ×9 IMPLANT
SUT PROLENE 5 0 C 1 36 (SUTURE) IMPLANT
SUT PROLENE 6 0 C 1 30 (SUTURE) IMPLANT
SUT PROLENE 7.0 RB 3 (SUTURE) ×28 IMPLANT
SUT PROLENE 8 0 BV175 6 (SUTURE) IMPLANT
SUT PROLENE BLUE 7 0 (SUTURE) ×8 IMPLANT
SUT PROLENE POLY MONO (SUTURE) IMPLANT
SUT SILK  1 MH (SUTURE) ×16
SUT SILK 1 MH (SUTURE) ×12 IMPLANT
SUT STEEL 6MS V (SUTURE) IMPLANT
SUT STEEL STERNAL CCS#1 18IN (SUTURE) IMPLANT
SUT STEEL SZ 6 DBL 3X14 BALL (SUTURE) IMPLANT
SUT VIC AB 1 CTX 36 (SUTURE)
SUT VIC AB 1 CTX36XBRD ANBCTR (SUTURE) IMPLANT
SUT VIC AB 2-0 CT1 27 (SUTURE) ×4
SUT VIC AB 2-0 CT1 TAPERPNT 27 (SUTURE) ×3 IMPLANT
SUT VIC AB 2-0 CTX 27 (SUTURE) IMPLANT
SUT VIC AB 3-0 SH 27 (SUTURE)
SUT VIC AB 3-0 SH 27X BRD (SUTURE) IMPLANT
SUT VIC AB 3-0 X1 27 (SUTURE) IMPLANT
SUT VICRYL 4-0 PS2 18IN ABS (SUTURE) IMPLANT
SUTURE EB EXC GRN/WHT 2-0 D/A (SUTURE) ×3 IMPLANT
SYSTEM SAHARA CHEST DRAIN ATS (WOUND CARE) ×8 IMPLANT
TAPE CLOTH SURG 4X10 WHT LF (GAUZE/BANDAGES/DRESSINGS) ×4 IMPLANT
TAPE PAPER 2X10 WHT MICROPORE (GAUZE/BANDAGES/DRESSINGS) ×4 IMPLANT
TOWEL GREEN STERILE (TOWEL DISPOSABLE) ×4 IMPLANT
TOWEL GREEN STERILE FF (TOWEL DISPOSABLE) ×4 IMPLANT
TRAY FOLEY SLVR 16FR TEMP STAT (SET/KITS/TRAYS/PACK) ×8 IMPLANT
TUBING LAP HI FLOW INSUFFLATIO (TUBING) ×4 IMPLANT
UNDERPAD 30X36 HEAVY ABSORB (UNDERPADS AND DIAPERS) ×4 IMPLANT
WATER STERILE IRR 1000ML POUR (IV SOLUTION) ×8 IMPLANT
WIRE EMERALD 3MM-J .035X150CM (WIRE) IMPLANT
WIRE HI TORQ VERSACORE-J 145CM (WIRE) ×4 IMPLANT

## 2020-06-10 NOTE — Progress Notes (Deleted)
  Echocardiogram 2D Echocardiogram has been performed.  Ian Mcgee 06/10/2020, 9:10 AM

## 2020-06-10 NOTE — Anesthesia Procedure Notes (Signed)
Arterial Line Insertion Start/End4/28/2022 7:30 AM, 06/10/2020 7:40 AM Performed by: Roberts Gaudy, MD  Left, brachial was placed Catheter size: 20 G Hand hygiene performed , maximum sterile barriers used  and Seldinger technique used  Attempts: 1 Procedure performed without using ultrasound guided technique. Following insertion, line sutured, dressing applied and Biopatch. Patient tolerated the procedure well with no immediate complications.

## 2020-06-10 NOTE — Hospital Course (Addendum)
History of Present Illness:  [name] is a 75 year old former smoker with a past medical history significant for nonischemic cardiomyopathy, complete heart block, status post placement of a biventricular pacer, mitral insufficiency, and cerebrovascular disease with bilateral 40 to 50% internal carotid artery stenoses.  He has longstanding intolerance to multiple cardiac meds including statins, beta-blockers, ACE inhibitor's, and ARB's.  He has a positive family history for coronary artery disease in his father, mother, and brother.  He normally enjoys walking trails frequently and playing tennis on a regular basis but about 4 weeks ago he noticed increasing shortness of breath with activity.  He presented to his cardiologist, Dr. Agustin Mcgee, on 05/27/2020 for initial evaluation.  EKG showed sinus rhythm with a paced ventricle but no acute ischemic changes.  A pro-BNP obtained that day was elevated to in excess of 1000.  Dr. Agustin Mcgee recommended proceeding with left heart catheterization and a referral to Dr. Burt Mcgee.  Right and left heart catheterization was carried out on 05/28/2020 electively and demonstrated severe calcific disease of the right coronary artery with an 80% proximal stenosis.  Additionally, there is a 32 to 70% stenosis at the ostium of the posterior descending coronary artery.  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  The LAD was heavily calcified with a 50% proximal stenosis followed by an 60% mid stenosis.  The diagonal had a 50% proximal stenosis.  The right heart cath showed elevated wedge pressure with large V waves and moderate pulmonary hypertension.  Further work-up included an transthoracic echocardiogram on 05/29/2020 showing moderate to severe mitral insufficiency with an eccentric, posteriorly directed jet.  The aortic valve was sclerotic but had no significant stenosis or insufficiency.   Tricuspid valve was normal in structure with mild TR.  Given these findings, CT surgery consultation for consideration of coronary bypass grafting and mitral valve repair was discussed with the patient by the cardiology team.  [name] preferred to discuss this further on an outpatient basis with his cardiologist, Dr. Agustin Mcgee before making a decision.  He was discharged from [facility] on 05/29/2020.  He saw Dr. Myriam Mcgee on 05/31/2020 and a decision was made to proceed with transesophageal echo followed by referral to cardiothoracic surgery.  The echo was performed on 06/03/2020 and confirmed severe mitral insufficiency and ejection fraction of 40%.  He was discharged home following this outpatient procedure but then had an episode of chest pain with radiation to his jaw along with shortness of breath at home on 06/05/2020.  His chest pain subsided after taking sublingual nitroglycerin.  He contacted Dr. Agustin Mcgee who recommended evaluation in the emergency room.  He presented to [facility] ED.  Work-up in the ED included chest x-ray that showed clear lung fields and stable cardiomegaly.  High-sensitivity troponin was elevated mildly to 27.  BNP was 320.  Having ruled in for non-ST elevation myocardial infarction, he was admitted to the hospital and started on heparin and nitroglycerin infusions.  Hospital Course:  Since admission, he has been diuresed aggressively.  He has had no further chest pain and vital signs have been stable.   CT surgery has been asked to evaluate [name] for consideration of coronary bypass grafting and mitral  valve repair with possible Maze procedure.  He was evaluated by Dr. Roxy Mcgee who felt the patient would be a good surgical candidate.  The risks and benefits of the procedure were explained to the  patient and his family and he was agreeable to proceed.  [name] on 06/10/2020.  He underwent CABG x 2 utilizing LIMA to LAD,  SVG to PDA, and SVG to Diagonal, MV Repair, and Left Sided MAZE procedure.  He also underwent bilateral endoscopic harvest of the greater saphenous vein from his thighs.  He tolerated the procedure without difficulty and was taken to the SICU in stable condition.  The patient was extubated the evening of surgery without difficulty.  During his stay in the SICU the patient was weaned off Milrinone and Levophed as hemodynamics allowed.  He was initiated on coumadin for his Mitral Valve Repair.  He is transferred to the cardiac telemetry floor on postop day #3.  The patient was maintaining NSR.  His pacing wires were removed without difficulty.  His CT output remained elevated, but we were able to remove his mediastinal chest tubes on 06/14/2020.  Pleural chest tubes were removed on 06/16/2020.  He developed an elevated creatinine level felt to be due to ATN.  He was also hyponatremic and volume overloaded. He was started on Spironolactone and Lasix for management of this. He developed complaints of visual disturbances in his right eye.  These were described as dots of color of his field of vision.  Neurology exam was normal.  He was hypertensive and initiated on Cozaar.  Advanced heart failure consult was obtained who recommended transition to [facility].  His visual disturbances improved with improvement of blood pressure.  However these persisted.  He was instructed to follow up with an Ophthalmologist for exam.  He had some mild episodes of confusion overnight on post-op day 5 but this had resolved by the following day.  The hyponatremia and AKI gradually resolved. Incisions were healing with no evidence of complication. Unfortunately, he developed hypotension and Entresto was discontinued.  The patients volume status improved and his Lasix was discontinued.  His INR is at 1.5 and he will be discharged home on 5 mg of coumadin daily.  He has a pain in his mouth that on exam appeared to be a cyst.  He was instructed to  follow up with his dentist for further evaluation.  He is ambulating without difficulty.  His surgical incisions are healing without evidence of infection.  He is medically stable for discharge home today.

## 2020-06-10 NOTE — Progress Notes (Signed)
TCTS Evening Rounds  DOS s/p CABG/mitral/maze Extubated No complaints HD stable Little CT output Good UOP  BP (!) 104/58   Pulse 80   Temp 97.7 F (36.5 C)   Resp 16   Ht 5\' 11"  (1.803 m)   Wt 71 kg   SpO2 100%   BMI 21.83 kg/m  Resting comfortably  Intake/Output Summary (Last 24 hours) at 06/10/2020 1918 Last data filed at 06/10/2020 1900 Gross per 24 hour  Intake 4602.03 ml  Output 4365 ml  Net 237.03 ml   A/p: continue early post op care  Analiah Drum Z. Orvan Seen, [place]

## 2020-06-10 NOTE — Op Note (Addendum)
CARDIOTHORACIC SURGERY OPERATIVE NOTE  Date of Procedure:  06/10/2020  Preoperative Diagnosis:   Severe Multi-vessel Coronary Artery Disease  Severe Mitral Regurgitation  Recurrent Persistent Atrial Fibrillation  Postoperative Diagnosis: Same  Procedure:    Coronary Artery Bypass Grafting x 3   Left Internal Mammary Artery to Distal Left Anterior Descending Coronary Artery  Saphenous Vein Graft to Posterior Descending Coronary Artery  Saphenous Vein Graft to Diagonal Branch Coronary Artery  Endoscopic Vein Harvest from Bilateral Thighs   Mitral Valve Repair  Edwards McCarthy-Adams IMR ETlogix Ring Annuloplasty (size 13mm, model #4100, serial #[id number])   Maze Procedure   Complete left atrial lesion set using bipolar radiofrequency and cryothermy ablation  Clipping of left atrial appendage (Atricure Pro245 left atrial clip, size 32mm)    Surgeon: Valentina Gu. Roxy Manns, MD  Assistant: Ellwood Handler, PA-C  Anesthesia: Roberts Gaudy, MD  Operative Findings:  Dilated cardiomyopathy with severe left ventricular dysfunction, EF 35% in the setting of moderate-severe mitral regurgitation  Type I and type IIIB mitral valve dysfunction with moderate-severe secondary mitral regurgitation  Good quality left internal mammary conduit for grafting  Fair to good quality saphenous vein conduit for grafting  Good quality target vessels for grafting  No residual mitral regurgitation after successful valve repair             BRIEF CLINICAL NOTE AND INDICATIONS FOR SURGERY  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 Agustin Cree for complete heart block status post permanent pacemaker placement, dilated nonischemic cardiomyopathy, status post biventricular pacemaker upgrade, coronary artery disease, mitral regurgitation, and cerebrovascular disease who presents with progressive symptoms of exertional shortness of breath,  intermittent exertional jaw pain, and at least 2 episodes of substernal chest pressure associated with severe shortness of breath consistent with unstable angina and acute on chronic combined systolic and diastolic congestive heart failure.  Despite his known history of significant chronic left ventricular systolic dysfunction patient has remained remarkably active physically until recently.  I have personally reviewed the patient's recent transthoracic and transesophageal echocardiograms and diagnostic cardiac catheterization.  Patient has dilated cardiomyopathy with severe mitral regurgitation.  Functional anatomy of mitral valve disease is consistent with secondary mitral regurgitation due to a combination of annular dilatation and type IIIb functional restriction of mitral valve leaflet motion.  Diagnostic cardiac catheterization reveals severe three-vessel coronary artery disease including long segment diffuse stenosis of the left anterior descending coronary artery that was documented to be flow-limiting on FFR analysis, high-grade 80 to 90% proximal stenosis of the right coronary artery, and diffuse long segment 75% stenosis of large diagonal branch of the left anterior descending coronary artery with small anomalous left circumflex.  Catheterization also revealed moderate pulmonary hypertension with large V waves on wedge tracing consistent with severe mitral regurgitation.  Cardiac output remain normal with relatively low central venous pressure.  The patient has been seen in consultation and counseled at length regarding the indications, risks and potential benefits of surgery.  All questions have been answered, and the patient provides full informed consent for the operation as described.    DETAILS OF THE OPERATIVE PROCEDURE  Preparation:  The patient is brought to the operating room on the above mentioned date and central monitoring was established by the anesthesia team including placement  of Swan-Ganz catheter and radial arterial line. The patient is placed in the supine position on the operating table.  Intravenous antibiotics are administered. General endotracheal anesthesia is induced uneventfully. A Foley catheter  is placed.  Baseline transesophageal echocardiogram was performed.  Findings were notable for dilated cardiomyopathy with severe left ventricular systolic dysfunction.  Ejection fraction is estimated 35% in the setting of moderate to severe mitral regurgitation.  There was functional mitral regurgitation related to annular dilatation and systolic restriction of the posterior leaflet and subvalvular apparatus.  There was hypokinesis of the inferior basilar wall.  There was mild central aortic insufficiency.  Right ventricular function appeared normal.  The patient's chest, abdomen, both groins, and both lower extremities are prepared and draped in a sterile manner. A time out procedure is performed.   Percutaneous Vascular Access:  Percutaneous venous access was obtained on the right side.  Using ultrasound guidance the right common femoral vein was cannulated using the Seldinger technique and a pair of Perclose vascular closure devises were placed at opposing 30 degree angles, after which time an 8 French sheath inserted.     Surgical Approach and Conduit Harvest:  A median sternotomy incision was performed and the left internal mammary artery is dissected from the chest wall and prepared for bypass grafting. The left internal mammary artery is notably good quality conduit. Simultaneously, the greater saphenous vein is obtained from the patient's right thigh using endoscopic vein harvest technique.  Portions of the vein were satisfactory but close to the knee that they became too small.  The second portion of the greater saphenous vein was taken to the patient's left thigh using endoscopic vein harvest technique.  Ultimately the saphenous vein utilized for grafting is  notably satisfactory.  Quality conduit. After removal of the saphenous vein, the small surgical incisions in the lower extremities are closed with absorbable suture. Following systemic heparinization, the left internal mammary artery was transected distally noted to have excellent flow.   Extracorporeal Cardiopulmonary Bypass and Myocardial Protection:  The patient was heparinized systemically.  The pericardium was opened and the ascending aorta is normal in appearance.  The ascending aorta is cannulated for cardiopulmonary bypass.  The right common femoral vein is cannulated through the venous sheath and a guidewire advanced into the right atrium using TEE guidance.  The femoral vein cannulated using a 22 Fr long femoral venous cannula.   Adequate heparinization is verified.    The entire pre-bypass portion of the operation was notable for stable hemodynamics.  Cardiopulmonary bypass was begun and the surface of the heart is inspected.  A second venous cannula is placed directly into the superior vena cava.   A cardioplegia cannula is placed in the ascending aorta.  A temperature probe was placed in the interventricular septum.  The patient is cooled to 32C systemic temperature.  The aortic cross clamp is applied and cardioplegia is delivered initially in an antegrade fashion through the aortic root using modified [place] cold blood cardioplegia (Kennestone blood cardioplegia protocol).   The initial cardioplegic arrest is rapid with early diastolic arrest.  Repeat doses of cardioplegia are administered at 90 minutes and every 30 minutes thereafter through the aortic root and subsequently placed vein grafts in order to maintain completely flat electrocardiogram.  Myocardial protection was felt to be excellent.   Coronary Artery Bypass Grafting:   The posterior descending branch of the right coronary artery was grafted using a reversed saphenous vein graft in an end-to-side fashion.  At the site  of distal anastomosis the target vessel was good quality and measured approximately 2.0 mm in diameter.  The diagonal branch of the left anterior descending coronary artery was grafted using a  reversed saphenous vein graft in an end-to-side fashion.  At the site of distal anastomosis the target vessel was good quality and measured approximately 2.0 mm in diameter.  The distal left anterior coronary artery was grafted with the left internal mammary artery in an end-to-side fashion.  At the site of distal anastomosis the target vessel was good quality and measured approximately 2.0 mm in diameter.   Maze Procedure (left atrial lesion set):  The AtriCure Synergy bipolar radiofrequency ablation clamp is used for all radiofrequency ablation lesions for the maze procedure.  The Atricure CryoICE nitrous oxide cryothermy system is utilized for all cryothermy ablation lesions.   The heart is retracted towards the surgeon's side and the left sided pulmonary veins exposed.  An elliptical ablation lesion is created around the base of the left sided pulmonary veins.  A similar elliptical lesion was created around the base of the left atrial appendage.  The left atrial appendage was obliterated using an left atrial appendage clip (Atricure Pro245 left atrial clip size 45 mm).  The heart was replaced into the pericardial sac.  An elliptical ablation lesion is created around the base of the right sided pulmonary veins.    A left atriotomy incision was performed through the interatrial groove and extended partially across the back wall of the left atrium after opening the oblique sinus inferiorly.  The floor of the left atrium and the mitral valve were exposed using a self-retaining retractor.  The mitral valve was inspected.  A bipolar ablation lesion was placed across the dome of the left atrium from the cephalad apex of the atriotomy incision to reach the cephalad apex of the elliptical lesion around the left  sided pulmonary veins.  A similar bipolar lesion was placed across the back wall of the left atrium from the caudad apex of the atriotomy incision to reach the caudad apex of the elliptical lesion around the left sided pulmonary veins, thereby completing a box.  Finally another bipolar lesion was placed across the back wall of the left atrium from the caudad apex of the atriotomy incision towards the posterior mitral valve annulus.  This lesion was completed along the endocardial surface onto the posterior mitral annulus with a 3 minute duration cryothermy lesion, followed by a second cryothermy lesion along the posterior epicardial surface of the left atrium across the coronary sinus with the probe extending up the epicardial surface of the lateral wall of the right atrium. This completes the entire left side lesion set of the Cox maze procedure.   Mitral Valve Repair:  The mitral valve was inspected and notable for normal-appearing mitral valve leaflets with mild to leaflet fibrosis and normal subvalvular apparatus.  The annulus was very dilated.  Ring annuloplasty was performed using interrupted 2-0 Ethibond horizontal mattress sutures placed circumferentially around the mitral annulus.  Mitral valve was sized to accept a 38 mm annuloplasty ring based on surface area of the anterior leaflet in the interim trigonal distance.  This was downsized for physiologic reasons to a 28 mm ring.  Edwards McCarthy-Adams IMR ETLogix ring annuloplasty (size 28 mm, model # 4100, serial # [id number]) was implanted uneventfully. All sutures were secured using a Cor-knot device.  The valve was immediately tested with saline and appeared perfectly competent.  The leak test revealed 6 to 8 mm of surface area of coaptation circumferentially across the entire length of the coaptation plane.    Rewarming is begun.  The atriotomy was closed using a 2-layer closure of  running 3-0 Prolene suture after placing a sump drain across the  mitral valve to serve as a left ventricular vent.   Procedure Completion:  All proximal vein graft anastomoses were placed directly to the ascending aorta prior to removal of the aortic cross clamp.  The septal myocardial temperature rose rapidly after reperfusion of the left internal mammary artery graft.  The aortic cross clamp was removed after a total cross clamp time of 143 minutes.  Epicardial pacing wires are fixed to the right ventricular outflow tract and to the right atrial appendage. The patient is rewarmed to 37C temperature. The aortic and left ventricular vents are removed.  The patient is weaned and disconnected from cardiopulmonary bypass.  The patient's rhythm at separation from bypass was AV paced.  The patient was weaned from cardioplegic bypass on low dose milrinone 0.25 mcg/kg/min without any inotropic support. Total cardiopulmonary bypass time for the operation was 167 minutes.  Followup transesophageal echocardiogram performed after separation from bypass revealed a well-seated mitral annuloplasty ring and a valve that was functioning normally and without any sign of residual mitral regurgitation.  Left ventricular function was unchanged from preoperatively.  Mean transvalvular gradient across the mitral valve was estimated 3 mmHg.  The aortic and superior vena cava cannula were removed uneventfully. Protamine was administered to reverse the anticoagulation. The femoral venous cannula was removed, Perclose sutures secured, and manual pressure held on the groin for 30 minutes.  The mediastinum and pleural space were inspected for hemostasis and irrigated with saline solution. The mediastinum and both pleural spaces were drained using 4 chest tubes placed through separate stab incisions inferiorly.  The soft tissues anterior to the aorta were reapproximated loosely. The sternum is closed with Fibertape cerclage. The soft tissues anterior to the sternum were closed in multiple layers  and the skin is closed with a running subcuticular skin closure.   The post-bypass portion of the operation was notable for stable rhythm and hemodynamics.   No blood products were administered during the operation.   Patient Disposition:  The patient tolerated the procedure well and is transported to the surgical intensive care in stable condition. There are no intraoperative complications. All sponge instrument and needle counts are verified correct at completion of the operation.     Valentina Gu. Roxy Manns MD 06/10/2020 1:55 PM

## 2020-06-10 NOTE — Brief Op Note (Signed)
06/05/2020 - 06/10/2020  12:32 PM  PATIENT:  Ian Mcgee  75 y.o. male  PRE-OPERATIVE DIAGNOSIS:  CAD MR  POST-OPERATIVE DIAGNOSIS:  Coronary artery disease Mitral regurgitation  PROCEDURE:  Procedure(s):  MITRAL VALVE REPAIR USING CARPENTIER MCCARTHY-ADAMS RING SIZE 28MM (N/A)  CORONARY ARTERY BYPASS GRAFTING x 3 -LIMA to LAD -SVG to PDA -SVG DIAGONAL  ENDOSCOPIC HARVEST GREATER SAPHENOUS VEIN -Right and Left Thigh  MAZE (N/A) -Complete Left Sided MAZE with Radiofrequency Ablation, Cryothermy -Clipping of LA Appendage Pro2 Clip Size 45 mm  TRANSESOPHAGEAL ECHOCARDIOGRAM (TEE) (N/A)  CLIPPING OF ATRIAL APPENDAGE USING  ATRICURE PRO2 CLIP SIZE 45MM  SURGEON:  Surgeon(s) and Role:    * Rexene Alberts, MD - Primary  PHYSICIAN ASSISTANT: Ellwood Handler PA-C  ANESTHESIA:   general  EBL:  Per perfusion record  BLOOD ADMINISTERED: CELLSAVER  DRAINS: Left Pleural Chest Tubes, Mediastinal Chest Drains   LOCAL MEDICATIONS USED:  NONE  SPECIMEN:  No Specimen  DISPOSITION OF SPECIMEN:  N/A  COUNTS:  YES  TOURNIQUET:  * No tourniquets in log *  DICTATION: .Dragon Dictation  PLAN OF CARE: Admit to inpatient   PATIENT DISPOSITION:  ICU - intubated and hemodynamically stable.   Delay start of Pharmacological VTE agent (>24hrs) due to surgical blood loss or risk of bleeding: yes

## 2020-06-10 NOTE — Anesthesia Preprocedure Evaluation (Signed)
Anesthesia Evaluation  Patient identified by MRN, date of birth, ID band Patient awake    Reviewed: Allergy & Precautions, NPO status , Patient's Chart, lab work & pertinent test results  Airway Mallampati: II  TM Distance: >3 FB Neck ROM: Full    Dental  (+) Teeth Intact, Dental Advisory Given   Pulmonary former smoker,    breath sounds clear to auscultation       Cardiovascular  Rhythm:Regular Rate:Normal     Neuro/Psych    GI/Hepatic   Endo/Other    Renal/GU      Musculoskeletal   Abdominal   Peds  Hematology   Anesthesia Other Findings   Reproductive/Obstetrics                             Anesthesia Physical Anesthesia Plan  ASA: IV  Anesthesia Plan: General   Post-op Pain Management:    Induction: Intravenous  PONV Risk Score and Plan: Ondansetron and Dexamethasone  Airway Management Planned: Oral ETT  Additional Equipment: Arterial line, CVP, PA Cath, 3D TEE and Ultrasound Guidance Line Placement  Intra-op Plan:   Post-operative Plan: Post-operative intubation/ventilation  Informed Consent: I have reviewed the patients History and Physical, chart, labs and discussed the procedure including the risks, benefits and alternatives for the proposed anesthesia with the patient or authorized representative who has indicated his/her understanding and acceptance.     Dental advisory given  Plan Discussed with: CRNA and Anesthesiologist  Anesthesia Plan Comments:         Anesthesia Quick Evaluation  

## 2020-06-10 NOTE — Progress Notes (Signed)
      [address]       [place] [address]             [phone]     CARDIOTHORACIC SURGERY PROGRESS NOTE  Subjective: Ian Mcgee has been scheduled for Procedure(s): MITRAL VALVE REPAIR OR REPLACEMENT  (MVR) (N/A) CORONARY ARTERY BYPASS GRAFTING (CABG) (N/A) possible MAZE (N/A) TRANSESOPHAGEAL ECHOCARDIOGRAM (TEE) (N/A) today.   Objective: Vital signs in last 24 hours: Temp:  [97.5 F (36.4 C)-98.8 F (37.1 C)] 98.1 F (36.7 C) (04/28 0508) Pulse Rate:  [62-73] 71 (04/28 0508) Cardiac Rhythm: Ventricular paced (04/28 0508) Resp:  [14-20] 18 (04/28 0508) BP: (109-140)/(47-87) 116/70 (04/28 0508) SpO2:  [95 %-99 %] 99 % (04/28 0508)  Physical Exam: Unchanged from previously   Intake/Output from previous day: 04/27 0701 - 04/28 0700 In: -  Out: 1700 [Urine:1700] Intake/Output this shift: Total I/O In: -  Out: 1700 [Urine:1700]  Lab Results: Recent Labs    06/09/20 0034  WBC 5.9  HGB 14.1  HCT 41.8  PLT 248   BMET:  Recent Labs    06/09/20 0034  NA 138  K 3.7  CL 103  CO2 26  GLUCOSE 82  BUN 14  CREATININE 0.87  CALCIUM 9.0    CBG (last 3)  No results for input(s): GLUCAP in the last 72 hours. PT/INR:   Recent Labs    06/09/20 0034  LABPROT 13.8  INR 1.1    Assessment/Plan:   The various methods of treatment have been discussed with the patient. After consideration of the risks, benefits and treatment options the patient has consented to the planned procedure.   The patient has been seen and labs reviewed. There are no changes in the patient's condition to prevent proceeding with the planned procedure today.   Rexene Alberts, MD 06/10/2020 6:16 AM

## 2020-06-10 NOTE — Transfer of Care (Signed)
Immediate Anesthesia Transfer of Care Note  Patient: PRYCE FOLTS  Procedure(s) Performed: MITRAL VALVE REPAIR USING CARPENTIER MCCARTHY-ADAMS RING SIZE 28MM (N/A Chest) CORONARY ARTERY BYPASS GRAFTING (CABG) X THREE USING LEFT INTERNAL MAMMARY ARTERY AND BILATERAL LEGS GREATER SAPHENOUS VEIN HARVESTED ENDOSCOPICALLY (N/A Chest) MAZE (N/A Chest) TRANSESOPHAGEAL ECHOCARDIOGRAM (TEE) (N/A ) CLIPPING OF ATRIAL APPENDAGE USING  ATRICURE PRO2 CLIP SIZE 45MM (Chest)  Patient Location: ICU  Anesthesia Type:General  Level of Consciousness: Patient remains intubated per anesthesia plan  Airway & Oxygen Therapy: Patient remains intubated per anesthesia plan and Patient placed on Ventilator (see vital sign flow sheet for setting)  Post-op Assessment: Report given to RN and Post -op Vital signs reviewed and stable  Post vital signs: Reviewed and stable  Last Vitals:  Vitals Value Taken Time  BP 104/69 06/10/20 1433  Temp 36.1 C 06/10/20 1436  Pulse 80 06/10/20 1436  Resp 12 06/10/20 1436  SpO2 100 % 06/10/20 1436  Vitals shown include unvalidated device data.  Last Pain:  Vitals:   06/10/20 0508  TempSrc: Oral  PainSc:          Complications: No complications documented.

## 2020-06-10 NOTE — Anesthesia Procedure Notes (Signed)
Procedure Name: Intubation Date/Time: 06/10/2020 8:10 AM Performed by: Reece Agar, CRNA Pre-anesthesia Checklist: Patient identified, Emergency Drugs available, Suction available and Patient being monitored Patient Re-evaluated:Patient Re-evaluated prior to induction Oxygen Delivery Method: Circle System Utilized Preoxygenation: Pre-oxygenation with 100% oxygen Induction Type: IV induction Ventilation: Mask ventilation without difficulty Laryngoscope Size: Mac and 4 Grade View: Grade III Tube type: Oral Tube size: 8.0 mm Number of attempts: 1 Airway Equipment and Method: Stylet Placement Confirmation: ETT inserted through vocal cords under direct vision,  positive ETCO2 and breath sounds checked- equal and bilateral Secured at: 22 cm Tube secured with: Tape Dental Injury: Injury to lip  Difficulty Due To: Difficulty was unanticipated, Difficult Airway- due to anterior larynx and Difficult Airway- due to limited oral opening Comments: Small lip laceration, cleansed and lubricant applied to lip

## 2020-06-10 NOTE — Progress Notes (Signed)
[employer] rep paged.  Waiting on return call.

## 2020-06-10 NOTE — Anesthesia Procedure Notes (Signed)
Central Venous Catheter Insertion Performed by: Roberts Gaudy, MD, anesthesiologist Start/End4/28/2022 7:00 AM, 06/10/2020 7:10 AM Patient location: Pre-op. Preanesthetic checklist: patient identified, IV checked, site marked, risks and benefits discussed, surgical consent, monitors and equipment checked, pre-op evaluation, timeout performed and anesthesia consent Position: supine Hand hygiene performed  and maximum sterile barriers used  PA cath was placed.Swan type:thermodilution Procedure performed without using ultrasound guided technique. Attempts: 1 Following insertion, line sutured, dressing applied and Biopatch. Post procedure assessment: blood return through all ports and free fluid flow  Patient tolerated the procedure well with no immediate complications.

## 2020-06-10 NOTE — Procedures (Signed)
Extubation Procedure Note  Patient Details:   Name: Ian Mcgee DOB: [birth year] MRN: [id number]   Airway Documentation:    Vent end date: 06/10/20 Vent end time: 1825   Evaluation  O2 sats: stable throughout Complications: No apparent complications Patient did tolerate procedure well. Bilateral Breath Sounds: Clear,Diminished   Yes   Pt was extubated per rapid wean protocol and placed on 4 L [place]. NIF -20 and VC 1.0L prior to extubation as well as a cuff leak. No stridor was noted post extubation. Pt is stable at this time. RT will monitor.   Ronaldo Miyamoto 06/10/2020, 6:35 PM

## 2020-06-10 NOTE — Anesthesia Procedure Notes (Signed)
Central Venous Catheter Insertion Performed by: Roberts Gaudy, MD, anesthesiologist Start/End4/28/2022 7:00 AM, 06/10/2020 7:10 AM Patient location: Pre-op. Preanesthetic checklist: patient identified, IV checked, site marked, risks and benefits discussed, surgical consent, monitors and equipment checked, pre-op evaluation, timeout performed and anesthesia consent Lidocaine 1% used for infiltration and patient sedated Hand hygiene performed  and maximum sterile barriers used  Catheter size: 9 Fr Sheath introducer Procedure performed using ultrasound guided technique. Ultrasound Notes:anatomy identified, needle tip was noted to be adjacent to the nerve/plexus identified, no ultrasound evidence of intravascular and/or intraneural injection and image(s) printed for medical record Attempts: 1 Following insertion, line sutured and dressing applied. Post procedure assessment: blood return through all ports, free fluid flow and no air  Patient tolerated the procedure well with no immediate complications.

## 2020-06-10 NOTE — Progress Notes (Signed)
  Echocardiogram Echocardiogram Transesophageal has been performed.  Ian Mcgee 06/10/2020, 9:10 AM

## 2020-06-11 ENCOUNTER — Other Ambulatory Visit: Payer: Self-pay | Admitting: Student

## 2020-06-11 ENCOUNTER — Inpatient Hospital Stay (HOSPITAL_COMMUNITY): Payer: Medicare Other

## 2020-06-11 ENCOUNTER — Encounter (HOSPITAL_COMMUNITY): Payer: Self-pay | Admitting: Thoracic Surgery (Cardiothoracic Vascular Surgery)

## 2020-06-11 DIAGNOSIS — Z48812 Encounter for surgical aftercare following surgery on the circulatory system: Secondary | ICD-10-CM

## 2020-06-11 DIAGNOSIS — Z9889 Other specified postprocedural states: Secondary | ICD-10-CM | POA: Diagnosis not present

## 2020-06-11 DIAGNOSIS — I340 Nonrheumatic mitral (valve) insufficiency: Secondary | ICD-10-CM

## 2020-06-11 LAB — BASIC METABOLIC PANEL
Anion gap: 6 (ref 5–15)
Anion gap: 10 (ref 5–15)
BUN: 16 mg/dL (ref 8–23)
BUN: 23 mg/dL (ref 8–23)
CO2: 23 mmol/L (ref 22–32)
CO2: 19 mmol/L — ABNORMAL LOW (ref 22–32)
Calcium: 7.8 mg/dL — ABNORMAL LOW (ref 8.9–10.3)
Calcium: 7.9 mg/dL — ABNORMAL LOW (ref 8.9–10.3)
Chloride: 105 mmol/L (ref 98–111)
Chloride: 102 mmol/L (ref 98–111)
Creatinine, Ser: 1 mg/dL (ref 0.61–1.24)
Creatinine, Ser: 1.88 mg/dL — ABNORMAL HIGH (ref 0.61–1.24)
GFR, Estimated: >60 mL/min (ref >60)
GFR, Estimated: 37 mL/min — ABNORMAL LOW (ref >60)
Glucose, Bld: 123 mg/dL — ABNORMAL HIGH (ref 70–99)
Glucose, Bld: 143 mg/dL — ABNORMAL HIGH (ref 70–99)
Potassium: 5 mmol/L (ref 3.5–5.1)
Potassium: 4.8 mmol/L (ref 3.5–5.1)
Sodium: 134 mmol/L — ABNORMAL LOW (ref 135–145)
Sodium: 131 mmol/L — ABNORMAL LOW (ref 135–145)

## 2020-06-11 LAB — CBC
HCT: 28.1 % — ABNORMAL LOW (ref 39.0–52.0)
HCT: 27.9 % — ABNORMAL LOW (ref 39.0–52.0)
Hemoglobin: 9.5 g/dL — ABNORMAL LOW (ref 13.0–17.0)
Hemoglobin: 9.3 g/dL — ABNORMAL LOW (ref 13.0–17.0)
MCH: 32.6 pg (ref 26.0–34.0)
MCH: 32.2 pg (ref 26.0–34.0)
MCHC: 33.8 g/dL (ref 30.0–36.0)
MCHC: 33.3 g/dL (ref 30.0–36.0)
MCV: 96.6 fL (ref 80.0–100.0)
MCV: 96.5 fL (ref 80.0–100.0)
Platelets: 142 10*3/uL — ABNORMAL LOW (ref 150–400)
Platelets: 142 10*3/uL — ABNORMAL LOW (ref 150–400)
RBC: 2.91 MIL/uL — ABNORMAL LOW (ref 4.22–5.81)
RBC: 2.89 MIL/uL — ABNORMAL LOW (ref 4.22–5.81)
RDW: 12.9 % (ref 11.5–15.5)
RDW: 12.9 % (ref 11.5–15.5)
WBC: 13.3 10*3/uL — ABNORMAL HIGH (ref 4.0–10.5)
WBC: 10.5 10*3/uL (ref 4.0–10.5)
nRBC: 0 % (ref 0.0–0.2)
nRBC: 0 % (ref 0.0–0.2)

## 2020-06-11 LAB — MAGNESIUM
Magnesium: 2.5 mg/dL — ABNORMAL HIGH (ref 1.7–2.4)
Magnesium: 2.5 mg/dL — ABNORMAL HIGH (ref 1.7–2.4)

## 2020-06-11 LAB — GLUCOSE, CAPILLARY
Glucose-Capillary: 115 mg/dL — ABNORMAL HIGH (ref 70–99)
Glucose-Capillary: 117 mg/dL — ABNORMAL HIGH (ref 70–99)
Glucose-Capillary: 117 mg/dL — ABNORMAL HIGH (ref 70–99)
Glucose-Capillary: 120 mg/dL — ABNORMAL HIGH (ref 70–99)
Glucose-Capillary: 120 mg/dL — ABNORMAL HIGH (ref 70–99)
Glucose-Capillary: 133 mg/dL — ABNORMAL HIGH (ref 70–99)
Glucose-Capillary: 138 mg/dL — ABNORMAL HIGH (ref 70–99)
Glucose-Capillary: 148 mg/dL — ABNORMAL HIGH (ref 70–99)

## 2020-06-11 LAB — COOXEMETRY PANEL
Carboxyhemoglobin: 1.4 % (ref 0.5–1.5)
Methemoglobin: 0.8 % (ref 0.0–1.5)
O2 Saturation: 63.1 %
Total hemoglobin: 9.6 g/dL — ABNORMAL LOW (ref 12.0–16.0)

## 2020-06-11 MED ORDER — INSULIN ASPART 100 UNIT/ML IJ SOLN: 0.0000 [IU] | INTRAMUSCULAR | Status: DC

## 2020-06-11 MED ORDER — FUROSEMIDE 10 MG/ML IJ SOLN
40.0000 mg | Freq: Two times a day (BID) | INTRAMUSCULAR | Status: DC
  Administered 2020-06-11 – 2020-06-12 (×3): 40 mg via INTRAVENOUS
  Filled 2020-06-11 (×3): qty 4

## 2020-06-11 MED ORDER — WARFARIN - PHYSICIAN DOSING INPATIENT: Freq: Every day | Status: DC

## 2020-06-11 MED ORDER — FUROSEMIDE 10 MG/ML IJ SOLN
20.0000 mg | Freq: Once | INTRAMUSCULAR | Status: AC
  Administered 2020-06-11: 20 mg via INTRAVENOUS
  Filled 2020-06-11: qty 2

## 2020-06-11 MED ORDER — WARFARIN SODIUM 2.5 MG PO TABS
2.5000 mg | ORAL_TABLET | Freq: Every day | ORAL | Status: DC
  Administered 2020-06-11 – 2020-06-13 (×3): 2.5 mg via ORAL
  Filled 2020-06-11 (×3): qty 1

## 2020-06-11 MED ORDER — ENOXAPARIN SODIUM 30 MG/0.3ML IJ SOSY
30.0000 mg | PREFILLED_SYRINGE | Freq: Every day | INTRAMUSCULAR | Status: DC
  Administered 2020-06-12 – 2020-06-16 (×5): 30 mg via SUBCUTANEOUS
  Filled 2020-06-11 (×5): qty 0.3

## 2020-06-11 MED ORDER — KETOROLAC TROMETHAMINE 15 MG/ML IJ SOLN
15.0000 mg | Freq: Four times a day (QID) | INTRAMUSCULAR | Status: DC
  Administered 2020-06-11 (×3): 15 mg via INTRAVENOUS
  Filled 2020-06-11 (×3): qty 1

## 2020-06-11 MED ORDER — ASPIRIN EC 81 MG PO TBEC: 81.0000 mg | DELAYED_RELEASE_TABLET | Freq: Every day | ORAL | Status: DC

## 2020-06-11 MED ORDER — ASPIRIN EC 325 MG PO TBEC
325.0000 mg | DELAYED_RELEASE_TABLET | Freq: Every day | ORAL | Status: AC
  Administered 2020-06-11: 325 mg via ORAL
  Filled 2020-06-11: qty 1

## 2020-06-11 MED ORDER — METOCLOPRAMIDE HCL 5 MG/ML IJ SOLN
10.0000 mg | Freq: Four times a day (QID) | INTRAMUSCULAR | Status: AC
  Administered 2020-06-11 (×3): 10 mg via INTRAVENOUS
  Filled 2020-06-11 (×3): qty 2

## 2020-06-11 MED ORDER — INSULIN ASPART 100 UNIT/ML IJ SOLN
0.0000 [IU] | Freq: Three times a day (TID) | INTRAMUSCULAR | Status: DC
  Administered 2020-06-11 – 2020-06-12 (×4): 2 [IU] via SUBCUTANEOUS

## 2020-06-11 MED ORDER — ASPIRIN EC 81 MG PO TBEC
81.0000 mg | DELAYED_RELEASE_TABLET | Freq: Every day | ORAL | Status: DC
  Administered 2020-06-12 – 2020-06-17 (×6): 81 mg via ORAL
  Filled 2020-06-11 (×6): qty 1

## 2020-06-11 MED FILL — Lidocaine HCl Local Preservative Free (PF) Inj 2%: INTRAMUSCULAR | Qty: 15 | Status: AC

## 2020-06-11 MED FILL — Potassium Chloride Inj 2 mEq/ML: INTRAVENOUS | Qty: 40 | Status: AC

## 2020-06-11 MED FILL — Heparin Sodium (Porcine) Inj 1000 Unit/ML: INTRAMUSCULAR | Qty: 30 | Status: AC

## 2020-06-11 NOTE — Discharge Instructions (Signed)
Discharge Instructions:  1. You may shower, please wash incisions daily with soap and water and keep dry.  If you wish to cover wounds with dressing you may do so but please keep clean and change daily.  No tub baths or swimming until incisions have completely healed.  If your incisions become red or develop any drainage please call our office at [phone]  2. No Driving until cleared by Dr. Guy Sandifer office and you are no longer using narcotic pain medications  3. Monitor your weight daily.. Please use the same scale and weigh at same time... If you gain 5-10 lbs in 48 hours with associated lower extremity swelling, please contact our office at [phone]  4. Fever of 101.5 for at least 24 hours with no source, please contact our office at [phone]  5. Activity- up as tolerated, please walk at least 3 times per day.  Avoid strenuous activity, no lifting, pushing, or pulling with your arms over 8-10 lbs for a minimum of 6 weeks  6. If any questions or concerns arise, please do not hesitate to contact our office at [phone]   Vitamin K Foods and Warfarin Warfarin is a blood thinner (anticoagulant). Anticoagulant medicines help prevent the formation of blood clots. Warfarin works by blocking the activity of vitamin K, which promotes normal blood clotting. When you take warfarin, problems can occur from suddenly increasing or decreasing the amount of vitamin K that you eat from one day to the next. These problems can occur due to varying levels of warfarin in your blood. Problems may include:  Blood clots.  Bleeding. What are tips for eating the right amount of vitamin K? To avoid problems when taking warfarin:  Eat a balanced diet that includes: ? Fresh fruits and vegetables. ? Whole grains. ? Low-fat dairy products. ? Lean proteins, such as fish, eggs, and lean cuts of meat.  Keep your intake of vitamin K consistent from day to day. To do this: ? Avoid eating large amounts of  vitamin K one day and low amounts of vitamin K the next day. ? If you take a multivitamin that contains vitamin K, be sure to take it every day. ? Know which foods contain vitamin K. Read food labels. Use the lists below to understand serving sizes and the amount of vitamin K in one serving.  Avoid major changes in your diet. If you are going to change your diet, talk with your health care provider before making changes.  Work with a Financial planner (dietitian) to develop a meal plan that works best for you.   What foods are high in vitamin K? Foods that are high in vitamin K contain more than 100 mcg (micrograms) per serving. These include:  Broccoli (cooked from fresh) -  cup (78 g) has 110 mcg.  Brussels sprouts (cooked from fresh) -  cup (78 g) has 109 mcg.  Greens, beet (cooked from fresh) -  cup (72 g) has 350 mcg.  Greens, collard (cooked from fresh) -  cup (66 g) has 263 mcg.  Greens, turnip (cooked from fresh) -  cup (72 g) has 265 mcg.  Green onions or scallions -  cup (50 g) has 105 mcg.  Kale (cooked from fresh) -  cup (68 g) has 536 mcg.  Parsley (raw) - 10 sprigs (10 g) has 164 mcg.  Spinach (cooked from fresh) -  cup (90 g) has 444 mcg.  Swiss chard (cooked from fresh) -  cup (88 g) has 287  mcg.   What foods have a moderate amount of vitamin K? Foods that have a moderate amount of vitamin K contain 25-100 mcg per serving. These include:  Asparagus (cooked from fresh) - 4 spears (60 g) have 30 mcg.  Black-eyed peas (dried) -  cup (85 g) has 32 mcg.  Cabbage (cooked from fresh) -  cup (78 g) has 84 mcg.  Cabbage (raw) -  cup (35 g) has 26 mcg.  Kiwi fruit - 1 medium (69 g) has 27 mcg.  Lettuce (raw) - 1 cup (36 g) has 45 mcg.  Okra (cooked from fresh) -  cup (80 g) has 32 mcg.  Prunes (dried) - 5 prunes (47 g) have 25 mcg.  Watercress (raw) - 1 cup (34 g) has 85 mcg. What foods are low in vitamin K? Foods low in vitamin K contain less  than 25 mcg per serving. These include:  Artichoke - 1 medium (128 g) has 18 mcg.  Avocado - 1 oz (21 g) has 6 mcg.  Blueberries -  cup (73 g) has 14 mcg.  Carrots (cooked from fresh) -  cup (78 g) has 11 mcg.  Cauliflower (raw) -  cup (54 g) has 8 mcg.  Cucumber with peel (raw) -  cup (52 g) has 9 mcg.  Grapes -  cup (76 g) has 12 mcg.  Mango - 1 medium (207 g) has 9 mcg.  Mixed nuts - 1 cup (142 g) has 17 mcg.  Pear - 1 medium (178 g) has 8 mcg.  Peas (cooked from fresh) -  cup (80 g) has 20 mcg.  Pickled cucumber - 1 spear (65 g) has 11 mcg.  Sauerkraut (canned) -  cup (118 g) has 16 mcg.  Soybeans (cooked from fresh) -  cup (86 g) has 16 mcg.  Tomato (raw) - 1 medium (123 g) has 10 mcg.  Tomato sauce (raw) -  cup (123 g) has 17 mcg.   What foods do not have vitamin K? If a food contains less than 5 mcg per serving, it is considered to have no vitamin K. These foods include:  Bread and cereal products.  Cheese.  Eggs.  Fish and shellfish.  Meat and poultry.  Milk and dairy products.  Seeds, such as sunflower or pumpkin seeds. The items listed above may not be a complete list of foods that have high, moderate, and low amounts of vitamin K or do not have vitamin K. Actual amounts of vitamin K may differ depending on processing. Contact a dietitian for more information. Summary  Warfarin is an anticoagulant that prevents blood clots by blocking the activity of vitamin K. It is important to monitor the content of vitamin K in your foods and to be consistent with the amount of vitamin K you take in each day.  Avoid major changes in your diet. If you are going to change your diet, talk with your health care provider before making changes. This information is not intended to replace advice given to you by your health care provider. Make sure you discuss any questions you have with your health care provider. Document Revised: 11/19/2018 Document Reviewed:  11/19/2018 Elsevier Patient Education  2021 [place].  Warfarin Information Warfarin is a blood thinner (anticoagulant). Anticoagulants help to prevent the formation of blood clots. They also help to stop the growth of blood clots. Your health care provider will monitor the anticoagulation effect of warfarin closely and will adjust your medicine as needed. Who should  use warfarin? Warfarin is prescribed for people who have blood clots, or who are at risk for developing harmful blood clots, such as people who:  Have mechanical heart valves.  Have irregular heart rhythms (atrial fibrillation).  Have certain clotting disorders.  Have had blood clots in the past or are currently receiving treatment for them. This includes people who have had a stroke, blood clots in the lungs (pulmonary embolism, or PE), or blood clots in the legs (deep vein thrombosis, or DVT). How is warfarin taken? Warfarin is taken by mouth (orally). Warfarin tablets come in different strengths. The strength is printed on the tablet and each strength is a different color. If you get a new prescription filled and the color of your tablet is different than usual, tell your pharmacist or health care provider immediately.  Take warfarin exactly as told by your health care provider. Doing this helps you avoid bleeding or blood clots that could result in serious injury, pain, or disability.  Take your medicine at the same time every day. If you forget to take your dose of warfarin, take it as soon as you remember that day. If you do not remember on that day, do not take an extra dose the next day.  Contact your health care provider if you miss or take an extra dose. Do not change your dosage on your own to make up for missed or extra doses. What blood tests do I need while taking warfarin? Warfarin is a medicine that needs to be closely monitored. It is very important to keep all lab visits and follow-up visits with your  health care provider. While taking warfarin, you will need to have blood tests regularly to measure your blood clotting time. These tests are called prothrombin tests (PT)or international normalized ratio (INR) tests. These tests can be done with a finger stick or a blood draw. What does the INR test result mean? The PT test results will be reported as the INR. The INR tells your health care provider whether your dosage of warfarin needs to be changed. The longer it takes your blood to clot, the higher the INR. Your health care provider will tell you your target INR range. If your INR is not in your target range, your health care provider may adjust your dosage.  If your INR is above your target range, there is a risk of bleeding. Your dosage of warfarin may need to be decreased.  If your INR is below your target range, there is a risk of clotting. Your dosage of warfarin may need to be increased. How often is the INR test needed?  When you first start warfarin, you will usually have your INR checked every few days.  You may need to have INR tests done more than once a week until the health care provider determines the correct dosage of warfarin.  After you have reached your target INR, your INR will be tested less often. However, you will need to have your INR checked at least once every 4-6 weeks for the entire time you are taking warfarin.  Some people may qualify for a home monitoring program to check their INR. Ask your health care provider if you qualify for this program.   What are the side effects of warfarin? Too much warfarin can cause bleeding or hemorrhage in any part of the body, such as:  Bleeding from the gums.  Unexplained bruises or bruises that get larger.  Blood in the urine or stools (  feces) that are bloody or dark in color.  Bleeding in the brain (hemorrhagic stroke).  A nosebleed that is not easily stopped.  Coughing up or vomiting blood. Warfarin use may also  cause:  Skin rash or irritations.  Nausea that does not go away.  Severe pain in the back or joints.  Painful toes that turn blue or purple (purple toe syndrome).  Painful ulcers that do not go away (skin necrosis). What precautions do I need to take while using warfarin?  Wear or carry identification that says that you are taking warfarin.  Make sure that all health care providers, including your dentist, know you are taking warfarin.  If you need surgery, talk with your health care provider about whether you should stop taking warfarin before your surgery.  Avoid situations that cause bleeding. You may bleed more easily while taking warfarin. To limit bleeding, take the following actions: ? Use a softer toothbrush. ? Floss with waxed floss, not unwaxed floss. ? Shave with an electric razor, not with a blade. ? Limit your use of sharp objects. ? Avoid activities that put you at risk for injury, such as contact sports. What do I need to know about warfarin and pregnancy or breastfeeding?  Warfarin is not recommended during the first trimester of pregnancy due to an increased risk of birth defects. In certain situations, a woman may take warfarin after her first trimester of pregnancy.  If you are taking warfarin and you become pregnant or plan to become pregnant, contact your health care provider right away.  If you plan to breastfeed while taking warfarin, talk with your health care provider first. What do I need to know about warfarin and alcohol or drug use?  Avoid drinking alcohol, or limit alcohol intake to no more than 1 drink a day for nonpregnant women and 2 drinks a day for men. ? Be aware of how much alcohol is in your drink. In the [place] one drink equals one 12 oz bottle of beer (355 mL), one 5 oz glass of wine (148 mL), or one 1 oz glass of hard liquor (44 mL). ? If you change the amount of alcohol that you drink, tell your health care provider. Your warfarin dosage  may need to be changed.  Avoid tobacco products, such as cigarettes, e-cigarettes, and chewing tobacco. If you need help quitting, ask your health care provider. ? If you change the amount of nicotine or tobacco that you use, tell your health care provider. Your warfarin dosage may need to be changed.  Avoid street drugs while taking warfarin. The effects of street drugs on warfarin are not known. What do I need to know about warfarin and other medicines or supplements?  Many prescription and over-the-counter medicines can interfere with warfarin. Talk with your health care provider or your pharmacist before starting or stopping any new medicines. This includes over-the-counter vitamins, dietary supplements, herbal medicines, and pain medicines. Your warfarin dosage may need to be adjusted.  Some common over-the-counter medicines that may increase the risk of bleeding while taking warfarin include: ? Aspirin. ? NSAIDs, such as ibuprofen or naproxen. ? Vitamin E. ? Fish oils. What do I need to know about warfarin and my diet?  It is important to maintain a normal, balanced diet while taking warfarin. Avoid major changes in your diet. If you are going to change your diet, talk with your health care provider before making changes.  Your health care provider may recommend that you work  with a dietitian.  Vitamin K decreases the effect of warfarin, and it is found in many foods. Eat a consistent amount of foods that contain vitamin K. For example, you may decide to eat 2 vitamin K-containing foods each day. Contact a health care provider if:  You miss a dose.  You take an extra dose.  You plan to have any kind of surgery or procedure.  You are unable to take your medicine due to nausea, vomiting, or diarrhea.  You have any major changes in your diet or you plan to make any major changes in your diet.  You start or stop any over-the-counter medicine, prescription medicine, herbal  supplement, or dietary supplement.  You become pregnant, plan to become pregnant, or think you may be pregnant.  You have menstrual periods that are heavier than usual.  You have unusual bruising. Get help right away if you:  Have signs of an allergic reaction, such as: ? Swelling of the lips, face, tongue, mouth, or throat. ? Rash or itchy, red, swollen areas of skin (hives). ? Trouble breathing. ? Chest tightness.  Fall or have an accident, especially if you hit your head.  Have signs that your blood is too thin, such as: ? Blood in your urine. Your urine may look reddish, pinkish, or tea-colored. ? Blood in your stool. Your stool may be black or bright red. ? You vomit blood or cough up blood. The blood may be bright red, or it may look like coffee grounds. ? Bleeding that does not stop after applying pressure to the area for 30 minutes.  Have signs of a blood clot in your leg or arm, such as: ? Pain or swelling in your leg or arm. ? Skin that is red or warm to the touch on your arm or leg.  Have signs of blood in your lung, such as: ? Shortness of breath or difficulty breathing. ? Chest pain. ? Unexplained fever.  Have any symptoms of a stroke. "BE FAST" is an easy way to remember the main warning signs of a stroke: ? B - Balance. Signs are dizziness, sudden trouble walking, or loss of balance. ? E - Eyes. Signs are trouble seeing or a sudden change in vision. ? F - Face. Signs are sudden weakness or numbness of the face, or the face or eyelid drooping on one side. ? A - Arms. Signs are weakness or numbness in an arm. This happens suddenly and usually on one side of the body. ? S - Speech. Signs are sudden trouble speaking, slurred speech, or trouble understanding what people say. ? T - Time. Time to call emergency services. Write down what time symptoms started.  Have other signs of a stroke, such as: ? A sudden, severe headache with no known cause. ? Nausea or  vomiting. ? Seizure.  Have other signs of a reaction to warfarin, such as: ? Purple or blue toes. ? Skin ulcers that do not go away. These symptoms may represent a serious problem that is an emergency. Do not wait to see if the symptoms will go away. Get medical help right away. Call your local emergency services (911 in the [place] Do not drive yourself to the hospital. Summary  Warfarin is a medicine that thins blood. It is used to prevent or treat blood clots. You must be monitored closely while on this medicine. Keep all follow-up visits.  Make sure that you know your target INR range and your warfarin dosage.  Wear or carry identification that says that you are taking warfarin.  Take warfarin at the same time every day. Call your health care provider if you miss a dose or if you take an extra dose. Do not change the dosage of warfarin on your own.  Know the signs and symptoms of blood clots, bleeding, and a stroke. Know when to get emergency medical help. This information is not intended to replace advice given to you by your health care provider. Make sure you discuss any questions you have with your health care provider. Document Revised: 10/13/2019 Document Reviewed: 11/29/2018 Elsevier Patient Education  [place].

## 2020-06-11 NOTE — Progress Notes (Signed)
Anesthesiology Follow-up:  75 year old male with dilated cardiomyopathy, NSTEMI, and functional MR. Underwent Maze Procedure, CABG X 3 and MV repair with 28 mm annuloplasty ring yesterday.  Awake and alert, neuro intact, in good spirits, having mild nausea, walking with assistance.  Hemodynamically stable on milrinone 0.125 mcg/kg/min  VS: T- 36.4 BP- 134/51 HR- 80 (A-V paced)  RR- 16 O2 Sat 96% on 4L Levasy  K-5.0 BUN/Cr.- 16/1.0 glucose- 123 H/H- 9.5/38.1 Platelets- 142,000  Extubated 4 hours post-op, stable post-op course, no apparent complications.  Roberts Gaudy

## 2020-06-11 NOTE — Anesthesia Postprocedure Evaluation (Signed)
Anesthesia Post Note  Patient: Ian Mcgee  Procedure(s) Performed: MITRAL VALVE REPAIR USING CARPENTIER MCCARTHY-ADAMS RING SIZE 28MM (N/A Chest) CORONARY ARTERY BYPASS GRAFTING (CABG) X THREE USING LEFT INTERNAL MAMMARY ARTERY AND BILATERAL LEGS GREATER SAPHENOUS VEIN HARVESTED ENDOSCOPICALLY (N/A Chest) MAZE (N/A Chest) TRANSESOPHAGEAL ECHOCARDIOGRAM (TEE) (N/A ) CLIPPING OF ATRIAL APPENDAGE USING  ATRICURE PRO2 CLIP SIZE 45MM (Chest)     Patient location during evaluation: SICU Anesthesia Type: General Level of consciousness: awake and alert Pain management: pain level controlled Vital Signs Assessment: post-procedure vital signs reviewed and stable Respiratory status: spontaneous breathing, nonlabored ventilation, respiratory function stable and patient connected to nasal cannula oxygen Cardiovascular status: blood pressure returned to baseline and stable Postop Assessment: no apparent nausea or vomiting Anesthetic complications: no   No complications documented.  Last Vitals:  Vitals:   06/11/20 1800 06/11/20 1900  BP: 139/66 (!) 112/55  Pulse: 90 82  Resp:    Temp:    SpO2: 97% 96%    Last Pain:  Vitals:   06/11/20 1900  TempSrc:   PainSc: Asleep                 Oluwadara Gorman COKER

## 2020-06-11 NOTE — Progress Notes (Addendum)
[address]       [place] [address]             [id number]      1 Day Post-Op Procedure(s) (LRB): MITRAL VALVE REPAIR USING CARPENTIER MCCARTHY-ADAMS RING SIZE 28MM (N/A) CORONARY ARTERY BYPASS GRAFTING (CABG) X THREE USING LEFT INTERNAL MAMMARY ARTERY AND BILATERAL LEGS GREATER SAPHENOUS VEIN HARVESTED ENDOSCOPICALLY (N/A) MAZE (N/A) TRANSESOPHAGEAL ECHOCARDIOGRAM (TEE) (N/A) CLIPPING OF ATRIAL APPENDAGE USING  ATRICURE PRO2 CLIP SIZE 45MM   Subjective:  Denies incisional pain.  However he states he feels full in his abdominal region, he states this is making him more nauseated.  He doesn't think this is his bowels has he moved his bowels yesterday.    Objective: Vital signs in last 24 hours: Temp:  [96.98 F (36.1 C)-97.88 F (36.6 C)] 97.52 F (36.4 C) (04/29 0700) Pulse Rate:  [79-100] 82 (04/29 0700) Cardiac Rhythm: Ventricular paced (04/28 2000) Resp:  [0-17] 17 (04/28 2000) BP: (104-126)/(56-75) 113/66 (04/29 0700) SpO2:  [91 %-100 %] 92 % (04/29 0700) Arterial Line BP: (106-137)/(49-65) 121/56 (04/29 0700) FiO2 (%):  [40 %-50 %] 40 % (04/28 1750) Weight:  [77.1 kg] 77.1 kg (04/29 0500)  Hemodynamic parameters for last 24 hours: PAP: (26-36)/(8-19) 32/11 CO:  [4 L/min-8.9 L/min] 8.9 L/min CI:  [2.1 L/min/m2-4.7 L/min/m2] 4.7 L/min/m2  Intake/Output from previous day: 04/28 0701 - 04/29 0700 In: 7163.9 [P.O.:600; I.V.:4085.1; Blood:420; IV Piggyback:2058.8] Out: 5732 [Urine:1780; Blood:880; Chest Tube:1243] Intake/Output this shift: Total I/O In: -  Out: 90 [Urine:30; Chest Tube:60]  General appearance: alert, cooperative and no distress Heart: regular rate and rhythm Lungs: clear to auscultation bilaterally Abdomen: soft, mildly distended Extremities: edema trace Wound: aquacel on sternum, EVH sites C/D/I Right groin w/o evidence of hematoma  Lab Results: Recent Labs    06/10/20 2136 06/11/20 0500  WBC 13.3* 13.3*  HGB 9.7*  9.5*  HCT 29.0* 28.1*  PLT 143* 142*   BMET:  Recent Labs    06/10/20 2136 06/11/20 0500  NA 136 134*  K 3.6 5.0  CL 108 105  CO2 23 23  GLUCOSE 151* 123*  BUN 14 16  CREATININE 0.81 1.00  CALCIUM 7.2* 7.8*    PT/INR:  Recent Labs    06/10/20 1438  LABPROT 17.4*  INR 1.4*   ABG    Component Value Date/Time   PHART 7.303 (L) 06/10/2020 1812   HCO3 23.6 06/10/2020 1812   TCO2 25 06/10/2020 1812   ACIDBASEDEF 3.0 (H) 06/10/2020 1812   O2SAT 63.1 06/11/2020 0506   CBG (last 3)  Recent Labs    06/11/20 0515 06/11/20 0709 06/11/20 0743  GLUCAP 120* 117* 120*    Assessment/Plan: S/P Procedure(s) (LRB): MITRAL VALVE REPAIR USING CARPENTIER MCCARTHY-ADAMS RING SIZE 28MM (N/A) CORONARY ARTERY BYPASS GRAFTING (CABG) X THREE USING LEFT INTERNAL MAMMARY ARTERY AND BILATERAL LEGS GREATER SAPHENOUS VEIN HARVESTED ENDOSCOPICALLY (N/A) MAZE (N/A) TRANSESOPHAGEAL ECHOCARDIOGRAM (TEE) (N/A) CLIPPING OF ATRIAL APPENDAGE USING  ATRICURE PRO2 CLIP SIZE 45MM  1. CV- paced rhythm- on Milrinone at 0.25 wean as hemodynamics allowed, patient has PPM in place, can remove EPW once pacemaker is interrogated, coumadin initiated for MV Repair 2. Pulm- CT output 1243, CXR w/o pneumothorax, left pleural effusion 3. Renal- creatinine WNL, weight is elevated, will give IV Lasix 20, K at 5.0, lasix will help with this 4. GI- patient with increasing nausea this morning, states he feels full.. doesn't think related to bowels... reglan ordered... per  nursing urinary output decreased overnight, will get bladder scan to ensure foley is working properly 5. Expected post operative blood loss anemia, mild hgb at 9.5 6. CBGs controlled, patient not a diabetic, will d/c insulin drip start SSIP TID/HS 7. Dispo- patient stable, rhythm paced on Milrinone will wean as tolerated, patient with abdominal fullness complaint, moved bowels yesterday, nausea increasing, will use zofran/reglan.. with decreased urinary  output will check bladder scan, trial small dose of Lasix, POD #1 progression orders per Dr.Delmon Andrada    LOS: 6 days    Ellwood Handler, PA-C 06/11/2020    I have seen and examined the patient and agree with the assessment and plan as outlined.  Overall doing well POD1.  AV paced rhythm w/ temporary pacer set AAI to increase HR.  Stable hemodynamics on very low dose milrinone and levophed, PA pressures relatively low.  Breathing comfortably on nasal cannula and CXR looks good.  Mobilize.  Wean drips.  Start diuretics once BP stable off levophed.  Start Coumadin.  Leave chest tubes 1 more day.   Rexene Alberts, MD 06/11/2020 9:51 AM

## 2020-06-11 NOTE — Progress Notes (Signed)
Progress Note  Patient Name: Ian Mcgee Date of Encounter: 06/11/2020  [facility] Cardiologist: Jenne Campus, MD   Subjective   Postop day 1.  No significant chest wall pain.  Does have some bloating.  Nausea  Inpatient Medications    Scheduled Meds: . acetaminophen  1,000 mg Oral Q6H  . aspirin EC  325 mg Oral Daily  . [START ON 06/12/2020] aspirin EC  81 mg Oral Daily  . atorvastatin  40 mg Oral Daily  . bisacodyl  10 mg Oral Daily   Or  . bisacodyl  10 mg Rectal Daily  . chlorhexidine  15 mL Mouth Rinse BID  . Chlorhexidine Gluconate Cloth  6 each Topical Daily  . docusate sodium  200 mg Oral Daily  . [START ON 06/12/2020] enoxaparin (LOVENOX) injection  30 mg Subcutaneous QHS  . furosemide  20 mg Intravenous Once  . insulin aspart  0-24 Units Subcutaneous TID AC & HS  . ketorolac  15 mg Intravenous Q6H  . mouth rinse  15 mL Mouth Rinse q12n4p  . metoCLOPramide (REGLAN) injection  10 mg Intravenous Q6H  . [START ON 06/12/2020] pantoprazole  40 mg Oral Daily  . sodium chloride flush  10-40 mL Intracatheter Q12H  . sodium chloride flush  3 mL Intravenous Q12H  . warfarin  2.5 mg Oral q1600  . Warfarin - Physician Dosing Inpatient   Does not apply q1600   Continuous Infusions: . sodium chloride 250 mL (06/11/20 0653)  . cefUROXime (ZINACEF)  IV 1.5 g (06/11/20 0810)  . lactated ringers    . lactated ringers    . milrinone 0.25 mcg/kg/min (06/11/20 0700)   PRN Meds: fentaNYL (SUBLIMAZE) injection, morphine injection, ondansetron (ZOFRAN) IV, oxyCODONE, sodium chloride flush, sodium chloride flush, traMADol   Vital Signs    Vitals:   06/11/20 0400 06/11/20 0500 06/11/20 0600 06/11/20 0700  BP: 111/65 126/71  113/66  Pulse: 80 86 100 82  Resp:      Temp: (!) 97.34 F (36.3 C) (!) 97.52 F (36.4 C) 97.7 F (36.5 C) (!) 97.52 F (36.4 C)  TempSrc:      SpO2: 97% 96% 91% 92%  Weight:  77.1 kg    Height:        Intake/Output Summary (Last 24  hours) at 06/11/2020 0908 Last data filed at 06/11/2020 0800 Gross per 24 hour  Intake 6813.94 ml  Output 3993 ml  Net 2820.94 ml   Last 3 Weights 06/11/2020 06/05/2020 06/05/2020  Weight (lbs) 169 lb 15.6 oz 156 lb 8.4 oz 156 lb 1.4 oz  Weight (kg) 77.1 kg 71 kg 70.8 kg      Telemetry    V paced - Personally Reviewed  Physical Exam   GEN: No acute distress.   Neck: No JVD Cardiac: RRR, no murmurs, rubs, or gallops.  Respiratory: Clear to auscultation bilaterally.  Chest tube in place GI: Soft, nontender, non-distended  MS: No edema; No deformity. Neuro:  Nonfocal  Psych: Normal affect   Labs    High Sensitivity Troponin:   Recent Labs  Lab 06/05/20 1934 06/05/20 2220 06/06/20 0608  TROPONINIHS 27* 31* 27*      Chemistry Recent Labs  Lab 06/05/20 1934 06/06/20 0030 06/09/20 0034 06/10/20 0606 06/10/20 0818 06/10/20 1311 06/10/20 1315 06/10/20 1812 06/10/20 2136 06/11/20 0500  NA 138   < > 138 138   < > 140   < > 142 136 134*  K 3.6   < >  3.7 3.8   < > 4.1   < > 4.0 3.6 5.0  CL 102   < > 103 103   < > 105  --   --  108 105  CO2 27   < > 26 24  --   --   --   --  23 23  GLUCOSE 92   < > 82 100*   < > 119*  --   --  151* 123*  BUN 19   < > 14 13   < > 14  --   --  14 16  CREATININE 0.93   < > 0.87 0.88   < > 0.70  --   --  0.81 1.00  CALCIUM 9.1   < > 9.0 9.1  --   --   --   --  7.2* 7.8*  PROT 6.6  --  5.9*  --   --   --   --   --   --   --   ALBUMIN 4.1  --  3.6  --   --   --   --   --   --   --   AST 29  --  23  --   --   --   --   --   --   --   ALT 24  --  20  --   --   --   --   --   --   --   ALKPHOS 48  --  40  --   --   --   --   --   --   --   BILITOT 0.9  --  0.6  --   --   --   --   --   --   --   GFRNONAA >60   < > >60 >60  --   --   --   --  >60 >60  ANIONGAP 9   < > 9 11  --   --   --   --  5 6   < > = values in this interval not displayed.     Hematology Recent Labs  Lab 06/10/20 1438 06/10/20 1441 06/10/20 1812 06/10/20 2136  06/11/20 0500  WBC 14.7*  --   --  13.3* 13.3*  RBC 3.31*  --   --  3.01* 2.91*  HGB 10.8*   < > 9.9* 9.7* 9.5*  HCT 32.0*   < > 29.0* 29.0* 28.1*  MCV 96.7  --   --  96.3 96.6  MCH 32.6  --   --  32.2 32.6  MCHC 33.8  --   --  33.4 33.8  RDW 12.7  --   --  12.7 12.9  PLT 146*  --   --  143* 142*   < > = values in this interval not displayed.    BNP Recent Labs  Lab 06/05/20 1936  BNP 320.0*     DDimer No results for input(s): DDIMER in the last 168 hours.   Radiology    DG Chest Port 1 View  Result Date: 06/11/2020 CLINICAL DATA:  75 year old male status post CABG postoperative day 1 EXAM: PORTABLE CHEST 1 VIEW COMPARISON:  06/10/2020 and earlier. FINDINGS: Portable AP semi upright view at 0545 hours. Extubated. Enteric tube removed. Swan-Ganz catheter has been advanced, tip now at the inferior right hilum. Otherwise stable lines and tubes. Mildly lower lung volumes. Stable  cardiac size and mediastinal contours. Stable left chest cardiac pacemaker. No pneumothorax or pulmonary edema. Retrocardiac hypo ventilation is stable. IMPRESSION: 1. Extubated and enteric tube removed. Swan-Ganz catheter advanced, tip now at the inferior right hilum. 2. Otherwise stable lines and tubes. 3. Mildly lower lung volumes. No pneumothorax or pulmonary edema. Retrocardiac atelectasis. Electronically Signed   By: Genevie Ann M.D.   On: 06/11/2020 07:44   DG Chest Port 1 View  Result Date: 06/10/2020 CLINICAL DATA:  Hypoxia. Status post coronary artery bypass grafting EXAM: PORTABLE CHEST 1 VIEW COMPARISON:  June 05, 2020 FINDINGS: Endotracheal tube tip is 7.6 cm above the carina. Nasogastric tube tip and side-port below the diaphragm. Swan-Ganz catheter tip is in the proximal right main pulmonary artery. Chest tube present on each side. Mediastinal drain present. Pacemaker leads are attached to the right atrium, right ventricle, and coronary sinus. No pneumothorax. There is airspace opacity left lower lobe  with small left pleural effusion. Atelectasis left mid lung. Right lung clear. Heart prominent with pulmonary vascularity normal. No adenopathy. Patient is status post coronary artery bypass grafting and mitral valve replacement. There is also a left atrial appendage clamp. There is aortic atherosclerosis. No bone lesions. IMPRESSION: Tube and catheter positions as described without pneumothorax. Left lower lobe consolidation in part due to atelectasis. A degree of pneumonia or possible aspiration left lower lobe cannot be excluded. Small left pleural effusion. Atelectasis left mid lung. Right lung clear. Postoperative changes as noted. Aortic Atherosclerosis (ICD10-I70.0). Electronically Signed   By: Lowella Grip III M.D.   On: 06/10/2020 15:13   ECHO INTRAOPERATIVE TEE  Result Date: 06/10/2020  *INTRAOPERATIVE TRANSESOPHAGEAL REPORT *  Patient Name:   JOSIEL GAHM Date of Exam: 06/10/2020 Medical Rec #:  [id number]         Height:       71.0 in Accession #:    [id number]        Weight:       156.5 lb Date of Birth:  December 15, 1945         BSA:          1.90 m Patient Age:    75 years          BP:           102/55 mmHg Patient Gender: M                 HR:           56 bpm. Exam Location:  Inpatient Transesophogeal exam was perform intraoperatively during surgical procedure. Patient was closely monitored under general anesthesia during the entirety of examination. Indications:     mitral regurgitation Performing Phys: Sorrento OWEN Diagnosing Phys: Roberts Gaudy MD Complications: No known complications during this procedure. PRE-OP FINDINGS  Left Ventricle: The left ventricle has moderate-severely reduced systolic function, with an ejection fraction of 30-35%. The cavity size was severely dilated. There is no left ventricular hypertrophy. There was marked LV enlargement with global hypokinesis. The LV end-diatolic diameter was 6.9 cm at the mid-papillary level in the trans-gastric short-axis view.  There was no thrombus seen within the LV cavity. On the post-bypass exam, the LV systolic function appeared unchanged from the pre-bypass study. Right Ventricle: There is no increase in right ventricular wall thickness. The RV cavity was normal in size with normal RV systolic function. There was a pacemaker lead seen crossing the tricuspid valve and entering the RV. On the post-bypass exam, there was normal  RV size and systolic function. Left Atrium: Left atrial size was dilated. No left atrial/left atrial appendage thrombus was detected. The LA cavity was dilated and measured 5.5 cm in the media-lateral dimension. The left atrial appendage is well visualized and there is no evidence of thrombus present. Left atrial appendage velocity is normal at greater than 40 cm/s. Right Atrium: Right atrial size was normal in size. Interatrial Septum: No atrial level shunt detected by color flow Doppler. Pericardium: There is no evidence of pericardial effusion. Mitral Valve: There was moderate mitral regurgitation which apppeared due to annular enlargement and leaflet tethering from LV dilatation and lateral papillary muscle displacement. Both leadflets appeared equally restricted in systole. There was a central jet of mitral insufficiency which was broad based and extended along the coaptation line from A2/P2 to A1/P1 as seen on 3D color Doppler. The ERO was 0.13 cm2 by 2D PISA with an MR regugitant volume of 26 cc. There was blunted forward systolic flow in the right and left upper pulmonary veins. On the post-bypass exam, there was an annuloplasty ring in the mitral position. There was no residual MR seen on 2D and 3D color Doppler. The mean trans-mitral inflow gradient was 3 mm hg. There was no systolic anterior motion of the mitral valve. Tricuspid Valve: The tricuspid valve was normal in structure. Tricuspid valve regurgitation is mild by color flow Doppler. No evidence of tricuspid stenosis is present. There was a  pacemaker lead seen croossing the tricuspid valve. Aortic Valve: The aortic valve is tricuspid Aortic valve regurgitation is mild by color flow Doppler. There is mild stenosis of the aortic valve. The aortic valve leaflets were moderately thickened with no obvious restriction to opening. However by continuous wave Doppler there was a paek velocity of 1.71 m/sec. with a peak graddient of 12 mm hg and a mean gradient od 5.5 mm hg. There was mild aortic insufficiency with a central jet. Pulmonic Valve: The pulmonic valve was normal in structure, with normal. No evidence of pumonic stenosis. Pulmonic valve regurgitation is trivial by color flow Doppler. Aorta: The aortic root and proximal ascending aorta were normal in diameter with a well defined sino-tubular junction without effacement. There was mild intimal thickeing with no protruding atheromatous disease. Aortic root diameter: 2.82 cm Proximal ascending aorta: 2.75 cm The descending thoracic aorta was normal in diameter with a diameter of 2.38 cm. There was scattered grade 2-3 atheromatous disease present.  Roberts Gaudy MD Electronically signed by Roberts Gaudy MD Signature Date/Time: 06/10/2020/6:59:40 PM    Final      Patient Profile     75 y.o. male with mitral valve repair in the setting of severe mitral regurgitation, coronary artery bypass x3, Maze procedure, clipping of left atrial appendage  Assessment & Plan    - Continuing to monitor postoperatively. -Pacemaker in place, complete heart block previously.  Functioning well. - On milrinone weaning   For questions or updates, please contact [place] Please consult [email or web address] for contact info under        Signed, Candee Furbish, MD  06/11/2020, 9:08 AM

## 2020-06-11 NOTE — Progress Notes (Signed)
EVENING ROUNDS NOTE :     [address]       [place] [address]             [phone]                 1 Day Post-Op Procedure(s) (LRB): MITRAL VALVE REPAIR USING CARPENTIER MCCARTHY-ADAMS RING SIZE 28MM (N/A) CORONARY ARTERY BYPASS GRAFTING (CABG) X THREE USING LEFT INTERNAL MAMMARY ARTERY AND BILATERAL LEGS GREATER SAPHENOUS VEIN HARVESTED ENDOSCOPICALLY (N/A) MAZE (N/A) TRANSESOPHAGEAL ECHOCARDIOGRAM (TEE) (N/A) CLIPPING OF ATRIAL APPENDAGE USING  ATRICURE PRO2 CLIP SIZE 45MM   Total Length of Stay:  LOS: 6 days  Events:   Creat bump today Good po intake Will watch for now    BP 139/66   Pulse 90   Temp 97.6 F (36.4 C) (Oral)   Resp 18   Ht 5\' 11"  (1.803 m)   Wt 77.1 kg   SpO2 97%   BMI 23.71 kg/m   PAP: (23-36)/(9-13) 28/9 CO:  [5.8 L/min-8.9 L/min] 6.6 L/min CI:  [3 L/min/m2-4.7 L/min/m2] 3.5 L/min/m2     . sodium chloride Stopped (06/11/20 1705)  . cefUROXime (ZINACEF)  IV Stopped (06/11/20 0840)  . lactated ringers    . lactated ringers    . milrinone 0.125 mcg/kg/min (06/11/20 1800)    I/O last 3 completed shifts: In: 7373.7 [P.O.:660; I.V.:4134.9; Blood:420; IV Piggyback:2158.8] Out: 0349 [ZPHXT:0569; Blood:880; Chest VXYI:0165]   CBC Latest Ref Rng & Units 06/11/2020 06/11/2020 06/10/2020  WBC 4.0 - 10.5 K/uL 10.5 13.3(H) 13.3(H)  Hemoglobin 13.0 - 17.0 g/dL 9.3(L) 9.5(L) 9.7(L)  Hematocrit 39.0 - 52.0 % 27.9(L) 28.1(L) 29.0(L)  Platelets 150 - 400 K/uL 142(L) 142(L) 143(L)    BMP Latest Ref Rng & Units 06/11/2020 06/11/2020 06/10/2020  Glucose 70 - 99 mg/dL 143(H) 123(H) 151(H)  BUN 8 - 23 mg/dL 23 16 14   Creatinine 0.61 - 1.24 mg/dL 1.88(H) 1.00 0.81  BUN/Creat Ratio 10 - 24 - - -  Sodium 135 - 145 mmol/L 131(L) 134(L) 136  Potassium 3.5 - 5.1 mmol/L 4.8 5.0 3.6  Chloride 98 - 111 mmol/L 102 105 108  CO2 22 - 32 mmol/L 19(L) 23 23  Calcium 8.9 - 10.3 mg/dL 7.9(L) 7.8(L) 7.2(L)    ABG    Component Value Date/Time    PHART 7.303 (L) 06/10/2020 1812   PCO2ART 47.4 06/10/2020 1812   PO2ART 103 06/10/2020 1812   HCO3 23.6 06/10/2020 1812   TCO2 25 06/10/2020 1812   ACIDBASEDEF 3.0 (H) 06/10/2020 1812   O2SAT 63.1 06/11/2020 0506       Melodie Bouillon, MD 06/11/2020 7:03 PM

## 2020-06-11 NOTE — Progress Notes (Signed)
Ordered post-op Echo following MVR as requested by CT Surgery. To be read by Structural Heart reader.   Darreld Mclean, PA-C 06/11/2020 10:45 AM

## 2020-06-12 ENCOUNTER — Inpatient Hospital Stay (HOSPITAL_COMMUNITY): Payer: Medicare Other

## 2020-06-12 DIAGNOSIS — I214 Non-ST elevation (NSTEMI) myocardial infarction: Secondary | ICD-10-CM | POA: Diagnosis not present

## 2020-06-12 LAB — COOXEMETRY PANEL
Carboxyhemoglobin: 1.2 % (ref 0.5–1.5)
Methemoglobin: 0.8 % (ref 0.0–1.5)
O2 Saturation: 60.5 %
Total hemoglobin: 8.7 g/dL — ABNORMAL LOW (ref 12.0–16.0)

## 2020-06-12 LAB — GLUCOSE, CAPILLARY
Glucose-Capillary: 132 mg/dL — ABNORMAL HIGH (ref 70–99)
Glucose-Capillary: 118 mg/dL — ABNORMAL HIGH (ref 70–99)
Glucose-Capillary: 119 mg/dL — ABNORMAL HIGH (ref 70–99)
Glucose-Capillary: 104 mg/dL — ABNORMAL HIGH (ref 70–99)

## 2020-06-12 LAB — BASIC METABOLIC PANEL
Anion gap: 7 (ref 5–15)
Anion gap: 10 (ref 5–15)
BUN: 29 mg/dL — ABNORMAL HIGH (ref 8–23)
BUN: 31 mg/dL — ABNORMAL HIGH (ref 8–23)
CO2: 20 mmol/L — ABNORMAL LOW (ref 22–32)
CO2: 21 mmol/L — ABNORMAL LOW (ref 22–32)
Calcium: 7.5 mg/dL — ABNORMAL LOW (ref 8.9–10.3)
Calcium: 8 mg/dL — ABNORMAL LOW (ref 8.9–10.3)
Chloride: 102 mmol/L (ref 98–111)
Chloride: 98 mmol/L (ref 98–111)
Creatinine, Ser: 2.24 mg/dL — ABNORMAL HIGH (ref 0.61–1.24)
Creatinine, Ser: 2.31 mg/dL — ABNORMAL HIGH (ref 0.61–1.24)
GFR, Estimated: 30 mL/min — ABNORMAL LOW (ref >60)
GFR, Estimated: 29 mL/min — ABNORMAL LOW (ref >60)
Glucose, Bld: 127 mg/dL — ABNORMAL HIGH (ref 70–99)
Glucose, Bld: 119 mg/dL — ABNORMAL HIGH (ref 70–99)
Potassium: 4.5 mmol/L (ref 3.5–5.1)
Potassium: 4.5 mmol/L (ref 3.5–5.1)
Sodium: 129 mmol/L — ABNORMAL LOW (ref 135–145)
Sodium: 129 mmol/L — ABNORMAL LOW (ref 135–145)

## 2020-06-12 LAB — CBC
HCT: 25.2 % — ABNORMAL LOW (ref 39.0–52.0)
HCT: 25.3 % — ABNORMAL LOW (ref 39.0–52.0)
Hemoglobin: 8.5 g/dL — ABNORMAL LOW (ref 13.0–17.0)
Hemoglobin: 8.5 g/dL — ABNORMAL LOW (ref 13.0–17.0)
MCH: 32.6 pg (ref 26.0–34.0)
MCH: 32.3 pg (ref 26.0–34.0)
MCHC: 33.7 g/dL (ref 30.0–36.0)
MCHC: 33.6 g/dL (ref 30.0–36.0)
MCV: 96.6 fL (ref 80.0–100.0)
MCV: 96.2 fL (ref 80.0–100.0)
Platelets: 132 10*3/uL — ABNORMAL LOW (ref 150–400)
Platelets: 141 10*3/uL — ABNORMAL LOW (ref 150–400)
RBC: 2.61 MIL/uL — ABNORMAL LOW (ref 4.22–5.81)
RBC: 2.63 MIL/uL — ABNORMAL LOW (ref 4.22–5.81)
RDW: 13.1 % (ref 11.5–15.5)
RDW: 13.2 % (ref 11.5–15.5)
WBC: 5.9 10*3/uL (ref 4.0–10.5)
WBC: 7.4 10*3/uL (ref 4.0–10.5)
nRBC: 0 % (ref 0.0–0.2)
nRBC: 0 % (ref 0.0–0.2)

## 2020-06-12 LAB — PROTIME-INR
INR: 1.3 — ABNORMAL HIGH (ref 0.8–1.2)
Prothrombin Time: 16.1 seconds — ABNORMAL HIGH (ref 11.4–15.2)

## 2020-06-12 MED ORDER — ORAL CARE MOUTH RINSE
15.0000 mL | Freq: Two times a day (BID) | OROMUCOSAL | Status: DC
  Administered 2020-06-14: 15 mL via OROMUCOSAL

## 2020-06-12 MED ORDER — COUMADIN BOOK
Freq: Once | Status: AC
  Filled 2020-06-12: qty 1

## 2020-06-12 MED ORDER — ALBUMIN HUMAN 5 % IV SOLN
12.5000 g | Freq: Once | INTRAVENOUS | Status: AC
  Administered 2020-06-12: 12.5 g via INTRAVENOUS
  Filled 2020-06-12: qty 250

## 2020-06-12 NOTE — Progress Notes (Signed)
      [address]       [place] [address]             [phone]                 2 Days Post-Op Procedure(s) (LRB): MITRAL VALVE REPAIR USING CARPENTIER MCCARTHY-ADAMS RING SIZE 28MM (N/A) CORONARY ARTERY BYPASS GRAFTING (CABG) X THREE USING LEFT INTERNAL MAMMARY ARTERY AND BILATERAL LEGS GREATER SAPHENOUS VEIN HARVESTED ENDOSCOPICALLY (N/A) MAZE (N/A) TRANSESOPHAGEAL ECHOCARDIOGRAM (TEE) (N/A) CLIPPING OF ATRIAL APPENDAGE USING  ATRICURE PRO2 CLIP SIZE 45MM   Events: No events overnight _______________________________________________________________ Vitals: BP 138/76   Pulse 93   Temp 98.3 F (36.8 C) (Oral)   Resp 17   Ht 5\' 11"  (1.803 m)   Wt 77.8 kg   SpO2 93%   BMI 23.92 kg/m   - Neuro: alert NAD  - Cardiovascular: paced internally  Drips: milr 0.125.      - Pulm: EWOB    ABG    Component Value Date/Time   PHART 7.303 (L) 06/10/2020 1812   PCO2ART 47.4 06/10/2020 1812   PO2ART 103 06/10/2020 1812   HCO3 23.6 06/10/2020 1812   TCO2 25 06/10/2020 1812   ACIDBASEDEF 3.0 (H) 06/10/2020 1812   O2SAT 60.5 06/12/2020 0409    - Abd: ND - Extremity: trace edema  .Intake/Output      04/29 0701 04/30 0700 04/30 0701 05/01 0700   P.O. 900    I.V. (mL/kg) 84.4 (1.1) 8 (0.1)   Blood     Other 0    IV Piggyback 200.1 100   Total Intake(mL/kg) 1184.4 (15.2) 108 (1.4)   Urine (mL/kg/hr) 490 (0.3) 380 (1.3)   Blood     Chest Tube 840 170   Total Output 1330 550   Net -145.6 -442.1           _______________________________________________________________ Labs: CBC Latest Ref Rng & Units 06/12/2020 06/11/2020 06/11/2020  WBC 4.0 - 10.5 K/uL 5.9 10.5 13.3(H)  Hemoglobin 13.0 - 17.0 g/dL 8.5(L) 9.3(L) 9.5(L)  Hematocrit 39.0 - 52.0 % 25.2(L) 27.9(L) 28.1(L)  Platelets 150 - 400 K/uL 132(L) 142(L) 142(L)   CMP Latest Ref Rng & Units 06/12/2020 06/11/2020 06/11/2020  Glucose 70 - 99 mg/dL 119(H) 127(H) 143(H)  BUN 8 - 23 mg/dL 31(H) 29(H)  23  Creatinine 0.61 - 1.24 mg/dL 2.31(H) 2.24(H) 1.88(H)  Sodium 135 - 145 mmol/L 129(L) 129(L) 131(L)  Potassium 3.5 - 5.1 mmol/L 4.5 4.5 4.8  Chloride 98 - 111 mmol/L 98 102 102  CO2 22 - 32 mmol/L 21(L) 20(L) 19(L)  Calcium 8.9 - 10.3 mg/dL 8.0(L) 7.5(L) 7.9(L)  Total Protein 6.5 - 8.1 g/dL - - -  Total Bilirubin 0.3 - 1.2 mg/dL - - -  Alkaline Phos 38 - 126 U/L - - -  AST 15 - 41 U/L - - -  ALT 0 - 44 U/L - - -    CXR: clear  _______________________________________________________________  Assessment and Plan: POD 2. s/pMVR CABG Atriclip   Neuro: pain controlled some anxiety CV: will keep milr for now given AKI Pulm: will keep CTs given output.  Continue pulm toilet Renal: AKI.  Will hold lasix, and give albumin.  Encourage PO intake GI: on diet Heme: stable.  On coumadin ID: afebrile Endo: SSI Dispo: continue ICU care   Lajuana Matte 06/12/2020 10:51 AM

## 2020-06-12 NOTE — Progress Notes (Signed)
Progress Note  Patient Name: Ian Mcgee Date of Encounter: 06/12/2020  [facility] Cardiologist: Jenne Campus, MD  Subjective   He states that he called Dr. Agustin Cree yesterday evening.  Reassurance was given.  We discussed at length expected postoperative course.  Nurse present.  No significant incisional pain.  Did have some right lower back pain while sitting in chair.  Muscular tension noted.  Inpatient Medications    Scheduled Meds: . acetaminophen  1,000 mg Oral Q6H  . aspirin EC  81 mg Oral Daily  . atorvastatin  40 mg Oral Daily  . bisacodyl  10 mg Oral Daily   Or  . bisacodyl  10 mg Rectal Daily  . chlorhexidine  15 mL Mouth Rinse BID  . Chlorhexidine Gluconate Cloth  6 each Topical Daily  . docusate sodium  200 mg Oral Daily  . enoxaparin (LOVENOX) injection  30 mg Subcutaneous QHS  . furosemide  40 mg Intravenous BID  . insulin aspart  0-24 Units Subcutaneous TID AC & HS  . mouth rinse  15 mL Mouth Rinse q12n4p  . pantoprazole  40 mg Oral Daily  . sodium chloride flush  10-40 mL Intracatheter Q12H  . sodium chloride flush  3 mL Intravenous Q12H  . warfarin  2.5 mg Oral q1600  . Warfarin - Physician Dosing Inpatient   Does not apply q1600   Continuous Infusions: . sodium chloride Stopped (06/11/20 1705)  . lactated ringers    . lactated ringers    . milrinone 0.125 mcg/kg/min (06/12/20 0734)   PRN Meds: fentaNYL (SUBLIMAZE) injection, morphine injection, ondansetron (ZOFRAN) IV, oxyCODONE, sodium chloride flush, sodium chloride flush, traMADol   Vital Signs    Vitals:   06/12/20 0500 06/12/20 0600 06/12/20 0700 06/12/20 0835  BP: 119/64 122/78 138/76   Pulse: 84 82 91 93  Resp:      Temp:   98.3 F (36.8 C)   TempSrc:   Oral   SpO2: 97% 99% 100% 93%  Weight:      Height:        Intake/Output Summary (Last 24 hours) at 06/12/2020 0854 Last data filed at 06/12/2020 0800 Gross per 24 hour  Intake 1179.56 ml  Output 1350 ml  Net  -170.44 ml   Last 3 Weights 06/12/2020 06/11/2020 06/05/2020  Weight (lbs) 171 lb 8.3 oz 169 lb 15.6 oz 156 lb 8.4 oz  Weight (kg) 77.8 kg 77.1 kg 71 kg      Telemetry    Pacing heart rate 90.  Does appear to have underlying P wave - Personally Reviewed   Physical Exam   GEN: No acute distress.  Sitting in chair. Neck: No JVD Cardiac: RRR, no murmurs, rubs, or gallops.  Chest tubes, external pacing wires Respiratory: Clear to auscultation bilaterally. GI: Soft, nontender, non-distended  MS: No edema; No deformity. Neuro:  Nonfocal  Psych: Normal affect   Labs    High Sensitivity Troponin:   Recent Labs  Lab 06/05/20 1934 06/05/20 2220 06/06/20 0608  TROPONINIHS 27* 31* 27*      Chemistry Recent Labs  Lab 06/05/20 1934 06/06/20 0030 06/09/20 0034 06/10/20 0606 06/11/20 1624 06/11/20 2323 06/12/20 0409  NA 138   < > 138   < > 131* 129* 129*  K 3.6   < > 3.7   < > 4.8 4.5 4.5  CL 102   < > 103   < > 102 102 98  CO2 27   < > 26   < >  19* 20* 21*  GLUCOSE 92   < > 82   < > 143* 127* 119*  BUN 19   < > 14   < > 23 29* 31*  CREATININE 0.93   < > 0.87   < > 1.88* 2.24* 2.31*  CALCIUM 9.1   < > 9.0   < > 7.9* 7.5* 8.0*  PROT 6.6  --  5.9*  --   --   --   --   ALBUMIN 4.1  --  3.6  --   --   --   --   AST 29  --  23  --   --   --   --   ALT 24  --  20  --   --   --   --   ALKPHOS 48  --  40  --   --   --   --   BILITOT 0.9  --  0.6  --   --   --   --   GFRNONAA >60   < > >60   < > 37* 30* 29*  ANIONGAP 9   < > 9   < > 10 7 10    < > = values in this interval not displayed.     Hematology Recent Labs  Lab 06/11/20 0500 06/11/20 1624 06/12/20 0409  WBC 13.3* 10.5 5.9  RBC 2.91* 2.89* 2.61*  HGB 9.5* 9.3* 8.5*  HCT 28.1* 27.9* 25.2*  MCV 96.6 96.5 96.6  MCH 32.6 32.2 32.6  MCHC 33.8 33.3 33.7  RDW 12.9 12.9 13.1  PLT 142* 142* 132*    BNP Recent Labs  Lab 06/05/20 1936  BNP 320.0*     DDimer No results for input(s): DDIMER in the last 168 hours.    Radiology    DG Chest Port 1 View  Result Date: 06/11/2020 CLINICAL DATA:  75 year old male status post CABG postoperative day 1 EXAM: PORTABLE CHEST 1 VIEW COMPARISON:  06/10/2020 and earlier. FINDINGS: Portable AP semi upright view at 0545 hours. Extubated. Enteric tube removed. Swan-Ganz catheter has been advanced, tip now at the inferior right hilum. Otherwise stable lines and tubes. Mildly lower lung volumes. Stable cardiac size and mediastinal contours. Stable left chest cardiac pacemaker. No pneumothorax or pulmonary edema. Retrocardiac hypo ventilation is stable. IMPRESSION: 1. Extubated and enteric tube removed. Swan-Ganz catheter advanced, tip now at the inferior right hilum. 2. Otherwise stable lines and tubes. 3. Mildly lower lung volumes. No pneumothorax or pulmonary edema. Retrocardiac atelectasis. Electronically Signed   By: Genevie Ann M.D.   On: 06/11/2020 07:44   DG Chest Port 1 View  Result Date: 06/10/2020 CLINICAL DATA:  Hypoxia. Status post coronary artery bypass grafting EXAM: PORTABLE CHEST 1 VIEW COMPARISON:  June 05, 2020 FINDINGS: Endotracheal tube tip is 7.6 cm above the carina. Nasogastric tube tip and side-port below the diaphragm. Swan-Ganz catheter tip is in the proximal right main pulmonary artery. Chest tube present on each side. Mediastinal drain present. Pacemaker leads are attached to the right atrium, right ventricle, and coronary sinus. No pneumothorax. There is airspace opacity left lower lobe with small left pleural effusion. Atelectasis left mid lung. Right lung clear. Heart prominent with pulmonary vascularity normal. No adenopathy. Patient is status post coronary artery bypass grafting and mitral valve replacement. There is also a left atrial appendage clamp. There is aortic atherosclerosis. No bone lesions. IMPRESSION: Tube and catheter positions as described without pneumothorax. Left lower lobe consolidation in part  due to atelectasis. A degree of pneumonia or  possible aspiration left lower lobe cannot be excluded. Small left pleural effusion. Atelectasis left mid lung. Right lung clear. Postoperative changes as noted. Aortic Atherosclerosis (ICD10-I70.0). Electronically Signed   By: Lowella Grip III M.D.   On: 06/10/2020 15:13    Cardiac Studies   Preop echo showed EF of 40%, severe mitral regurgitation  Patient Profile     75 y.o. male with severe mitral regurgitation, coronary artery disease status post CABG mitral valve repair Maze procedure left atrial appendage clipping  Assessment & Plan    Acute kidney injury - Creatinine 1.0> 1.88> 2.24> 2.31. -Continuing with supportive care, contractility improvement with milrinone.  Should start to turn around post ATN.  Weight 171 pounds up from preop weight of 156 pounds.  Severe mitral regurgitation - Valve repair.  Dr. Roxy Manns.  Coronary artery disease - CABG x3  Paroxysmal atrial fibrillation/flutter - Left atrial appendage clipping  Complete heart block/internal pacemaker - Currently externally pacing heart rate 90.  External pacing wires will be withdrawn soon.  Agree with increased heart rate to improve overall output especially in the setting of ATN.    For questions or updates, please contact [place] Please consult [email or web address] for contact info under        Signed, Candee Furbish, MD  06/12/2020, 8:54 AM

## 2020-06-13 DIAGNOSIS — I214 Non-ST elevation (NSTEMI) myocardial infarction: Secondary | ICD-10-CM | POA: Diagnosis not present

## 2020-06-13 LAB — BASIC METABOLIC PANEL
Anion gap: 7 (ref 5–15)
BUN: 32 mg/dL — ABNORMAL HIGH (ref 8–23)
CO2: 23 mmol/L (ref 22–32)
Calcium: 7.9 mg/dL — ABNORMAL LOW (ref 8.9–10.3)
Chloride: 97 mmol/L — ABNORMAL LOW (ref 98–111)
Creatinine, Ser: 1.35 mg/dL — ABNORMAL HIGH (ref 0.61–1.24)
GFR, Estimated: 55 mL/min — ABNORMAL LOW (ref >60)
Glucose, Bld: 107 mg/dL — ABNORMAL HIGH (ref 70–99)
Potassium: 4.1 mmol/L (ref 3.5–5.1)
Sodium: 127 mmol/L — ABNORMAL LOW (ref 135–145)

## 2020-06-13 LAB — CBC
HCT: 23.6 % — ABNORMAL LOW (ref 39.0–52.0)
Hemoglobin: 8 g/dL — ABNORMAL LOW (ref 13.0–17.0)
MCH: 32.5 pg (ref 26.0–34.0)
MCHC: 33.9 g/dL (ref 30.0–36.0)
MCV: 95.9 fL (ref 80.0–100.0)
Platelets: 128 10*3/uL — ABNORMAL LOW (ref 150–400)
RBC: 2.46 MIL/uL — ABNORMAL LOW (ref 4.22–5.81)
RDW: 13.1 % (ref 11.5–15.5)
WBC: 7.5 10*3/uL (ref 4.0–10.5)
nRBC: 0 % (ref 0.0–0.2)

## 2020-06-13 LAB — GLUCOSE, CAPILLARY
Glucose-Capillary: 93 mg/dL (ref 70–99)
Glucose-Capillary: 103 mg/dL — ABNORMAL HIGH (ref 70–99)
Glucose-Capillary: 129 mg/dL — ABNORMAL HIGH (ref 70–99)
Glucose-Capillary: 117 mg/dL — ABNORMAL HIGH (ref 70–99)

## 2020-06-13 LAB — COOXEMETRY PANEL
Carboxyhemoglobin: 1.2 % (ref 0.5–1.5)
Methemoglobin: 1 % (ref 0.0–1.5)
O2 Saturation: 57.5 %
Total hemoglobin: 8.1 g/dL — ABNORMAL LOW (ref 12.0–16.0)

## 2020-06-13 LAB — PROTIME-INR
INR: 1.2 (ref 0.8–1.2)
Prothrombin Time: 14.9 seconds (ref 11.4–15.2)

## 2020-06-13 MED ORDER — SODIUM CHLORIDE 0.9 % IV SOLN: 250.0000 mL | INTRAVENOUS | Status: DC | PRN

## 2020-06-13 MED ORDER — SODIUM CHLORIDE 0.9% FLUSH
3.0000 mL | Freq: Two times a day (BID) | INTRAVENOUS | Status: DC
  Administered 2020-06-13 – 2020-06-17 (×7): 3 mL via INTRAVENOUS

## 2020-06-13 MED ORDER — POLYETHYLENE GLYCOL 3350 17 G PO PACK
17.0000 g | PACK | Freq: Every day | ORAL | Status: DC
  Administered 2020-06-13: 17 g via ORAL
  Filled 2020-06-13 (×5): qty 1

## 2020-06-13 MED ORDER — ~~LOC~~ CARDIAC SURGERY, PATIENT & FAMILY EDUCATION: Freq: Once | Status: AC

## 2020-06-13 MED ORDER — SODIUM CHLORIDE 0.9% FLUSH: 3.0000 mL | INTRAVENOUS | Status: DC | PRN

## 2020-06-13 MED FILL — Mannitol IV Soln 20%: INTRAVENOUS | Qty: 1000 | Status: AC

## 2020-06-13 MED FILL — Sodium Chloride IV Soln 0.9%: INTRAVENOUS | Qty: 2000 | Status: AC

## 2020-06-13 MED FILL — Heparin Sodium (Porcine) Inj 1000 Unit/ML: INTRAMUSCULAR | Qty: 30 | Status: AC

## 2020-06-13 MED FILL — Lidocaine HCl Local Soln Prefilled Syringe 100 MG/5ML (2%): INTRAMUSCULAR | Qty: 15 | Status: AC

## 2020-06-13 MED FILL — Sodium Bicarbonate IV Soln 8.4%: INTRAVENOUS | Qty: 50 | Status: AC

## 2020-06-13 MED FILL — Electrolyte-R (PH 7.4) Solution: INTRAVENOUS | Qty: 4000 | Status: AC

## 2020-06-13 NOTE — Progress Notes (Signed)
Mobility Specialist - Progress Note   06/13/20 1448  Mobility  Activity Ambulated in hall  Level of Assistance Minimal assist, patient does 75% or more  Assistive Device Front wheel walker  Distance Ambulated (ft) 430 ft  Mobility Response Tolerated well  Mobility performed by Mobility specialist  $Mobility charge 1 Mobility   Pre-mobility: 92 HR, 92% SpO2 During mobility: 104 HR, 91% SpO2 Post-mobility: 97 HR, 94% SpO2  Pt endorsed discomfort from chest tubes, but was otherwise asx w/ ambulation. He ambulated on RA. Pt min assist to elevate legs when getting back into bed.   Pricilla Handler Mobility Specialist Mobility Specialist Phone: [phone]

## 2020-06-13 NOTE — Progress Notes (Signed)
Pulled epicardial wires with Robinette Haines RN per order. No new ectopy noted. Will continue to monitor.

## 2020-06-13 NOTE — Progress Notes (Signed)
      [name]       [place], [address]             [phone]                 3 Days Post-Op Procedure(s) (LRB): MITRAL VALVE REPAIR USING CARPENTIER MCCARTHY-ADAMS RING SIZE 28MM (N/A) CORONARY ARTERY BYPASS GRAFTING (CABG) X THREE USING LEFT INTERNAL MAMMARY ARTERY AND BILATERAL LEGS GREATER SAPHENOUS VEIN HARVESTED ENDOSCOPICALLY (N/A) MAZE (N/A) TRANSESOPHAGEAL ECHOCARDIOGRAM (TEE) (N/A) CLIPPING OF ATRIAL APPENDAGE USING  ATRICURE PRO2 CLIP SIZE 45MM   Events: No events overnight  _______________________________________________________________ Vitals: BP 132/74 (BP Location: Left Arm)   Pulse 82   Temp 98.1 F (36.7 C) (Oral)   Resp 16   Ht 5\' 11"  (1.803 m)   Wt 74.6 kg   SpO2 96%   BMI 22.94 kg/m   - Neuro: alert NAD  - Cardiovascular: paced internally  Drips: milr 0.125    - Pulm: EWOB    ABG    Component Value Date/Time   PHART 7.303 (L) 06/10/2020 1812   PCO2ART 47.4 06/10/2020 1812   PO2ART 103 06/10/2020 1812   HCO3 23.6 06/10/2020 1812   TCO2 25 06/10/2020 1812   ACIDBASEDEF 3.0 (H) 06/10/2020 1812   O2SAT 57.5 06/13/2020 0415    - Abd: ND - Extremity: trace edema  .Intake/Output      04/30 0701 05/01 0700 05/01 0701 05/02 0700   P.Ian. 240    I.V. (mL/kg) 61.1 (0.8)    Other     IV Piggyback 100    Total Intake(mL/kg) 401.1 (5.4)    Urine (mL/kg/hr) 1955 (1.1) 50 (0.2)   Chest Tube 750 20   Total Output 2705 70   Net -2303.9 -70           _______________________________________________________________ Labs: CBC Latest Ref Rng & Units 06/13/2020 06/12/2020 06/12/2020  WBC 4.0 - 10.5 K/uL 7.5 7.4 5.9  Hemoglobin 13.0 - 17.0 g/dL 8.0(L) 8.5(L) 8.5(L)  Hematocrit 39.0 - 52.0 % 23.6(L) 25.3(L) 25.2(L)  Platelets 150 - 400 K/uL 128(L) 141(L) 132(L)   CMP Latest Ref Rng & Units 06/13/2020 06/12/2020 06/11/2020  Glucose 70 - 99 mg/dL 107(H) 119(H) 127(H)  BUN 8 - 23 mg/dL 32(H) 31(H) 29(H)  Creatinine 0.61 - 1.24  mg/dL 1.35(H) 2.31(H) 2.24(H)  Sodium 135 - 145 mmol/L 127(L) 129(L) 129(L)  Potassium 3.5 - 5.1 mmol/L 4.1 4.5 4.5  Chloride 98 - 111 mmol/L 97(L) 98 102  CO2 22 - 32 mmol/L 23 21(L) 20(L)  Calcium 8.9 - 10.3 mg/dL 7.9(L) 8.0(L) 7.5(L)  Total Protein 6.5 - 8.1 g/dL - - -  Total Bilirubin 0.3 - 1.2 mg/dL - - -  Alkaline Phos 38 - 126 U/L - - -  AST 15 - 41 U/L - - -  ALT 0 - 44 U/L - - -    CXR: Pending   _______________________________________________________________  Assessment and Plan: POD 3. s/pMVR CABG At[place]lip   Neuro: pain controlled some anxiety CV: will remove wires.  Will d/c milr Pulm: will keep CTs given output.  Continue pulm toilet.  Only on 1 L Renal: creat improved.  Diuresing well off lasix.  Will hold for now GI: on diet Heme: stable.  On coumadin ID: afebrile Endo: SSI Dispo: floor   Ian Mcgee Ian Mcgee 06/13/2020 10:04 AM

## 2020-06-13 NOTE — Plan of Care (Signed)
  Problem: Education: Goal: Knowledge of General Education information will improve Description: Including pain rating scale, medication(s)/side effects and non-pharmacologic comfort measures Outcome: Progressing   Problem: Health Behavior/Discharge Planning: Goal: Ability to manage health-related needs will improve Outcome: Progressing   Problem: Clinical Measurements: Goal: Ability to maintain clinical measurements within normal limits will improve Outcome: Progressing Goal: Will remain free from infection Outcome: Progressing Goal: Respiratory complications will improve Outcome: Progressing   Problem: Activity: Goal: Risk for activity intolerance will decrease Outcome: Progressing   Problem: Coping: Goal: Level of anxiety will decrease Outcome: Progressing   Problem: Elimination: Goal: Will not experience complications related to bowel motility Outcome: Progressing Goal: Will not experience complications related to urinary retention Outcome: Progressing

## 2020-06-13 NOTE — Progress Notes (Signed)
Progress Note  Patient Name: Ian Mcgee Date of Encounter: 06/13/2020  [facility] Cardiologist: Jenne Campus, MD  Subjective   Progressing.  ATN improving.  He did have some confusion disorientation last night.  Being careful with sedatives/anxiolytics/opiates.  Blood pressure high at times.  Inpatient Medications    Scheduled Meds: . acetaminophen  1,000 mg Oral Q6H  . aspirin EC  81 mg Oral Daily  . atorvastatin  40 mg Oral Daily  . bisacodyl  10 mg Oral Daily   Or  . bisacodyl  10 mg Rectal Daily  . chlorhexidine  15 mL Mouth Rinse BID  . Chlorhexidine Gluconate Cloth  6 each Topical Daily  . docusate sodium  200 mg Oral Daily  . enoxaparin (LOVENOX) injection  30 mg Subcutaneous QHS  . insulin aspart  0-24 Units Subcutaneous TID AC & HS  . mouth rinse  15 mL Mouth Rinse q12n4p  . pantoprazole  40 mg Oral Daily  . polyethylene glycol  17 g Oral Daily  . sodium chloride flush  10-40 mL Intracatheter Q12H  . sodium chloride flush  3 mL Intravenous Q12H  . warfarin  2.5 mg Oral q1600  . Warfarin - Physician Dosing Inpatient   Does not apply q1600   Continuous Infusions: . sodium chloride Stopped (06/11/20 1705)  . lactated ringers    . lactated ringers    . milrinone 0.125 mcg/kg/min (06/13/20 0600)   PRN Meds: fentaNYL (SUBLIMAZE) injection, morphine injection, ondansetron (ZOFRAN) IV, oxyCODONE, sodium chloride flush, sodium chloride flush, traMADol   Vital Signs    Vitals:   06/13/20 0400 06/13/20 0500 06/13/20 0600 06/13/20 0645  BP: (!) 141/75 (!) 147/76 121/66   Pulse: 94 87 81   Resp: 16     Temp: (!) 97.1 F (36.2 C)   98.1 F (36.7 C)  TempSrc: Oral   Oral  SpO2: 91% 100% 97%   Weight: 74.6 kg     Height:        Intake/Output Summary (Last 24 hours) at 06/13/2020 0828 Last data filed at 06/13/2020 0800 Gross per 24 hour  Intake 401.06 ml  Output 2665 ml  Net -2263.94 ml   Last 3 Weights 06/13/2020 06/12/2020 06/11/2020  Weight (lbs)  164 lb 7.4 oz 171 lb 8.3 oz 169 lb 15.6 oz  Weight (kg) 74.6 kg 77.8 kg 77.1 kg      Telemetry    Pacing heart rate 90.  Does appear to have underlying P wave - Personally Reviewed   Physical Exam   General alert and oriented x3, external pacing wires, no significant edema, lungs clear, heart regular, chest tubes in place  Labs    High Sensitivity Troponin:   Recent Labs  Lab 06/05/20 1934 06/05/20 2220 06/06/20 0608  TROPONINIHS 27* 31* 27*      Chemistry Recent Labs  Lab 06/09/20 0034 06/10/20 0606 06/11/20 2323 06/12/20 0409 06/13/20 0422  NA 138   < > 129* 129* 127*  K 3.7   < > 4.5 4.5 4.1  CL 103   < > 102 98 97*  CO2 26   < > 20* 21* 23  GLUCOSE 82   < > 127* 119* 107*  BUN 14   < > 29* 31* 32*  CREATININE 0.87   < > 2.24* 2.31* 1.35*  CALCIUM 9.0   < > 7.5* 8.0* 7.9*  PROT 5.9*  --   --   --   --   ALBUMIN 3.6  --   --   --   --  AST 23  --   --   --   --   ALT 20  --   --   --   --   ALKPHOS 40  --   --   --   --   BILITOT 0.6  --   --   --   --   GFRNONAA >60   < > 30* 29* 55*  ANIONGAP 9   < > 7 10 7    < > = values in this interval not displayed.     Hematology Recent Labs  Lab 06/12/20 0409 06/12/20 1120 06/13/20 0422  WBC 5.9 7.4 7.5  RBC 2.61* 2.63* 2.46*  HGB 8.5* 8.5* 8.0*  HCT 25.2* 25.3* 23.6*  MCV 96.6 96.2 95.9  MCH 32.6 32.3 32.5  MCHC 33.7 33.6 33.9  RDW 13.1 13.2 13.1  PLT 132* 141* 128*    BNP No results for input(s): BNP, PROBNP in the last 168 hours.   DDimer No results for input(s): DDIMER in the last 168 hours.   Radiology    DG Chest Port 1 View  Result Date: 06/12/2020 CLINICAL DATA:  CABG June 10, 2020. EXAM: PORTABLE CHEST 1 VIEW COMPARISON:  June 12, 2020 FINDINGS: Mediastinal drain and chest tubes are stable. No pneumothorax. Stable pacemaker. Stable valve replacement. Stable cardiomegaly. The hila and mediastinum are unremarkable. Mild bilateral opacities in the right base and left midlung are  identified. Probable atelectasis and effusion in the left base. IMPRESSION: 1. Support apparatus as above. 2. Mild bilateral pulmonary opacities could represent atelectasis or infiltrate. Recommend attention on follow-up. 3. Probable small left effusion with underlying atelectasis. Electronically Signed   By: Dorise Bullion III M.D   On: 06/12/2020 13:58   DG Chest Port 1 View  Result Date: 06/12/2020 CLINICAL DATA:  Atelectasis. EXAM: PORTABLE CHEST 1 VIEW COMPARISON:  June 11, 2020 FINDINGS: A right IJ sheath remains. The PA catheter is been removed. Chest tubes and mediastinal drain are stable. No pneumothorax. Mild atelectasis in the right base, unchanged. Probable small left effusion with underlying atelectasis, similar in the interval. No overt edema. The cardiomediastinal silhouette is stable. The pacemaker is stable. IMPRESSION: 1. Support apparatus as above. 2. Small left effusion with underlying opacity, favored to represent atelectasis, is stable. No other changes. Electronically Signed   By: Dorise Bullion III M.D   On: 06/12/2020 09:08    Cardiac Studies   Preop echo showed EF of 40%, severe mitral regurgitation  Patient Profile     75 y.o. male with severe mitral regurgitation, coronary artery disease status post CABG mitral valve repair Maze procedure left atrial appendage clipping  Assessment & Plan    Acute kidney injury - Creatinine 1.0> 1.88> 2.24> 2.31> 1.35. -Continuing with supportive care.  Good overall turnaround in creatinine.  Post ATN diuresis noted.  Hyponatremia noted as well, watch over excessive free water.  Consider dose of Lasix. -With elevated blood pressures, may be able to gently wean off milrinone.  Severe mitral regurgitation - Valve repair.  Dr. Roxy Manns. - Low-dose Coumadin started per pharmacy.  This will be temporary post valve repair.  Coronary artery disease - CABG x3  Paroxysmal atrial fibrillation/flutter - Left atrial appendage clipping,  Maze procedure.  P waves have been present.  Complete heart block/internal pacemaker - Currently externally pacing heart rate 90.  External pacing wires will be withdrawn soon.   Constipation - MiraLAX has been ordered.  Disorientation - Transient overnight.  Being careful with  anxiolytics/opiates  For questions or updates, please contact [facility] Please consult [email or web address] for contact info under        Signed, Candee Furbish, MD  06/13/2020, 8:28 AM

## 2020-06-14 ENCOUNTER — Inpatient Hospital Stay (HOSPITAL_COMMUNITY): Payer: Medicare Other

## 2020-06-14 DIAGNOSIS — Z9889 Other specified postprocedural states: Secondary | ICD-10-CM | POA: Diagnosis not present

## 2020-06-14 DIAGNOSIS — N179 Acute kidney failure, unspecified: Secondary | ICD-10-CM

## 2020-06-14 DIAGNOSIS — I5043 Acute on chronic combined systolic (congestive) and diastolic (congestive) heart failure: Secondary | ICD-10-CM

## 2020-06-14 DIAGNOSIS — E871 Hypo-osmolality and hyponatremia: Secondary | ICD-10-CM

## 2020-06-14 DIAGNOSIS — I480 Paroxysmal atrial fibrillation: Secondary | ICD-10-CM | POA: Diagnosis not present

## 2020-06-14 DIAGNOSIS — I340 Nonrheumatic mitral (valve) insufficiency: Secondary | ICD-10-CM | POA: Diagnosis not present

## 2020-06-14 LAB — POCT I-STAT 7, (LYTES, BLD GAS, ICA,H+H)
Acid-Base Excess: 4 mmol/L — ABNORMAL HIGH (ref 0.0–2.0)
Bicarbonate: 30.2 mmol/L — ABNORMAL HIGH (ref 20.0–28.0)
Calcium, Ion: 1.09 mmol/L — ABNORMAL LOW (ref 1.15–1.40)
HCT: 30 % — ABNORMAL LOW (ref 39.0–52.0)
Hemoglobin: 10.2 g/dL — ABNORMAL LOW (ref 13.0–17.0)
O2 Saturation: 100 %
Potassium: 6.3 mmol/L (ref 3.5–5.1)
Sodium: 137 mmol/L (ref 135–145)
TCO2: 32 mmol/L (ref 22–32)
pCO2 arterial: 56.1 mmHg — ABNORMAL HIGH (ref 32.0–48.0)
pH, Arterial: 7.339 — ABNORMAL LOW (ref 7.350–7.450)
pO2, Arterial: 435 mmHg — ABNORMAL HIGH (ref 83.0–108.0)

## 2020-06-14 LAB — CBC
HCT: 25.2 % — ABNORMAL LOW (ref 39.0–52.0)
Hemoglobin: 8.6 g/dL — ABNORMAL LOW (ref 13.0–17.0)
MCH: 32.6 pg (ref 26.0–34.0)
MCHC: 34.1 g/dL (ref 30.0–36.0)
MCV: 95.5 fL (ref 80.0–100.0)
Platelets: 178 10*3/uL (ref 150–400)
RBC: 2.64 MIL/uL — ABNORMAL LOW (ref 4.22–5.81)
RDW: 12.9 % (ref 11.5–15.5)
WBC: 10.5 10*3/uL (ref 4.0–10.5)
nRBC: 0 % (ref 0.0–0.2)

## 2020-06-14 LAB — BASIC METABOLIC PANEL
Anion gap: 10 (ref 5–15)
BUN: 24 mg/dL — ABNORMAL HIGH (ref 8–23)
CO2: 21 mmol/L — ABNORMAL LOW (ref 22–32)
Calcium: 8.3 mg/dL — ABNORMAL LOW (ref 8.9–10.3)
Chloride: 95 mmol/L — ABNORMAL LOW (ref 98–111)
Creatinine, Ser: 1.08 mg/dL (ref 0.61–1.24)
GFR, Estimated: >60 mL/min (ref >60)
Glucose, Bld: 106 mg/dL — ABNORMAL HIGH (ref 70–99)
Potassium: 4.2 mmol/L (ref 3.5–5.1)
Sodium: 126 mmol/L — ABNORMAL LOW (ref 135–145)

## 2020-06-14 LAB — GLUCOSE, CAPILLARY: Glucose-Capillary: 140 mg/dL — ABNORMAL HIGH (ref 70–99)

## 2020-06-14 LAB — PROTIME-INR
INR: 1.2 (ref 0.8–1.2)
Prothrombin Time: 14.9 seconds (ref 11.4–15.2)

## 2020-06-14 MED ORDER — WARFARIN SODIUM 5 MG PO TABS
5.0000 mg | ORAL_TABLET | Freq: Every day | ORAL | Status: DC
  Administered 2020-06-14 – 2020-06-16 (×3): 5 mg via ORAL
  Filled 2020-06-14 (×3): qty 1

## 2020-06-14 MED ORDER — SACUBITRIL-VALSARTAN 24-26 MG PO TABS
1.0000 | ORAL_TABLET | Freq: Two times a day (BID) | ORAL | Status: DC
  Administered 2020-06-15 – 2020-06-16 (×2): 1 via ORAL
  Filled 2020-06-14 (×2): qty 1

## 2020-06-14 MED ORDER — LOSARTAN POTASSIUM 25 MG PO TABS: 25.0000 mg | ORAL_TABLET | Freq: Every day | ORAL | Status: DC

## 2020-06-14 MED ORDER — FUROSEMIDE 10 MG/ML IJ SOLN
40.0000 mg | Freq: Two times a day (BID) | INTRAMUSCULAR | Status: DC
  Administered 2020-06-14 – 2020-06-15 (×4): 40 mg via INTRAVENOUS
  Filled 2020-06-14 (×4): qty 4

## 2020-06-14 MED ORDER — CARVEDILOL 3.125 MG PO TABS
3.1250 mg | ORAL_TABLET | Freq: Two times a day (BID) | ORAL | Status: DC
  Administered 2020-06-14 – 2020-06-17 (×5): 3.125 mg via ORAL
  Filled 2020-06-14 (×5): qty 1

## 2020-06-14 MED ORDER — POTASSIUM CHLORIDE CRYS ER 20 MEQ PO TBCR
20.0000 meq | EXTENDED_RELEASE_TABLET | Freq: Two times a day (BID) | ORAL | Status: DC
  Administered 2020-06-14 (×2): 20 meq via ORAL
  Filled 2020-06-14 (×2): qty 1

## 2020-06-14 MED ORDER — METOPROLOL TARTRATE 12.5 MG HALF TABLET: 12.5000 mg | ORAL_TABLET | Freq: Two times a day (BID) | ORAL | Status: DC

## 2020-06-14 MED ORDER — FE FUMARATE-B12-VIT C-FA-IFC PO CAPS
1.0000 | ORAL_CAPSULE | Freq: Every day | ORAL | Status: DC
  Administered 2020-06-14 – 2020-06-17 (×4): 1 via ORAL
  Filled 2020-06-14 (×4): qty 1

## 2020-06-14 MED ORDER — SPIRONOLACTONE 25 MG PO TABS
25.0000 mg | ORAL_TABLET | Freq: Every day | ORAL | Status: DC
  Administered 2020-06-14 – 2020-06-17 (×4): 25 mg via ORAL
  Filled 2020-06-14 (×4): qty 1

## 2020-06-14 MED ORDER — LISINOPRIL 5 MG PO TABS
5.0000 mg | ORAL_TABLET | Freq: Every day | ORAL | Status: DC
  Administered 2020-06-14: 5 mg via ORAL
  Filled 2020-06-14: qty 1

## 2020-06-14 MED ORDER — LOSARTAN POTASSIUM 25 MG PO TABS
25.0000 mg | ORAL_TABLET | Freq: Every day | ORAL | Status: DC
  Administered 2020-06-15: 25 mg via ORAL
  Filled 2020-06-14: qty 1

## 2020-06-14 NOTE — Progress Notes (Signed)
CARDIAC REHAB PHASE I   PRE:  Rate/Rhythm: paced 98     SaO2: 99%RA  MODE:  Ambulation: 560 ft   POST:  Rate/Rhythm: 106 paced  BP:  Supine:   Sitting: 119/5  Standing:    SaO2: 98%RA 1117-1200 Assisted pt with finishing cleaning up. Pt walked 560 ft on RA with rolling walker and minimal asst. Gait steady with some DOE. Stopped once to rest. To recliner after walk. Once sats registered, at 98%RA. Chest tube intact and back to suction . Call bell given. Encouraged IS.  Graylon Good, RN BSN  06/14/2020 11:57 AM

## 2020-06-14 NOTE — Progress Notes (Incomplete)
Progress Note  Patient Name: Ian Mcgee Date of Encounter: 06/14/2020  [facility] Cardiologist: Jenne Campus, MD ***  Subjective   ***  Inpatient Medications    Scheduled Meds: . acetaminophen  1,000 mg Oral Q6H  . aspirin EC  81 mg Oral Daily  . atorvastatin  40 mg Oral Daily  . bisacodyl  10 mg Oral Daily   Or  . bisacodyl  10 mg Rectal Daily  . carvedilol  3.125 mg Oral BID WC  . chlorhexidine  15 mL Mouth Rinse BID  . docusate sodium  200 mg Oral Daily  . enoxaparin (LOVENOX) injection  30 mg Subcutaneous QHS  . ferrous [id number] C-folic acid  1 capsule Oral Q breakfast  . furosemide  40 mg Intravenous BID  . losartan  25 mg Oral Daily  . mouth rinse  15 mL Mouth Rinse q12n4p  . pantoprazole  40 mg Oral Daily  . polyethylene glycol  17 g Oral Daily  . potassium chloride  20 mEq Oral BID WC  . [START ON 06/15/2020] sacubitril-valsartan  1 tablet Oral BID  . sodium chloride flush  3 mL Intravenous Q12H  . spironolactone  25 mg Oral Daily  . warfarin  5 mg Oral q1600  . Warfarin - Physician Dosing Inpatient   Does not apply q1600   Continuous Infusions: . sodium chloride Stopped (06/13/20 1549)   PRN Meds: sodium chloride, ondansetron (ZOFRAN) IV, oxyCODONE, sodium chloride flush, traMADol   Vital Signs    Vitals:   06/14/20 1000 06/14/20 1030 06/14/20 1209 06/14/20 1658  BP: (!) 147/74 133/84 117/68 122/67  Pulse: 95 93 91 87  Resp: 18 19 15 18   Temp:   98.2 F (36.8 C) 98.5 F (36.9 C)  TempSrc:   Oral Oral  SpO2: 95% 100% 100% 100%  Weight:      Height:        Intake/Output Summary (Last 24 hours) at 06/14/2020 1857 Last data filed at 06/14/2020 1704 Gross per 24 hour  Intake 0 ml  Output 3780 ml  Net -3780 ml   Last 3 Weights 06/14/2020 06/13/2020 06/12/2020  Weight (lbs) 165 lb 1.6 oz 164 lb 7.4 oz 171 lb 8.3 oz  Weight (kg) 74.889 kg 74.6 kg 77.8 kg      Telemetry    *** - Personally Reviewed  ECG    *** - Personally  Reviewed  Physical Exam  *** GEN: No acute distress.   Neck: No JVD Cardiac: RRR, no murmurs, rubs, or gallops.  Respiratory: Clear to auscultation bilaterally. GI: Soft, nontender, non-distended  MS: No edema; No deformity. Neuro:  Nonfocal  Psych: Normal affect   Labs    High Sensitivity Troponin:   Recent Labs  Lab 06/05/20 1934 06/05/20 2220 06/06/20 0608  TROPONINIHS 27* 31* 27*      Chemistry Recent Labs  Lab 06/09/20 0034 06/10/20 0606 06/12/20 0409 06/13/20 0422 06/14/20 0131  NA 138   < > 129* 127* 126*  K 3.7   < > 4.5 4.1 4.2  CL 103   < > 98 97* 95*  CO2 26   < > 21* 23 21*  GLUCOSE 82   < > 119* 107* 106*  BUN 14   < > 31* 32* 24*  CREATININE 0.87   < > 2.31* 1.35* 1.08  CALCIUM 9.0   < > 8.0* 7.9* 8.3*  PROT 5.9*  --   --   --   --  ALBUMIN 3.6  --   --   --   --   AST 23  --   --   --   --   ALT 20  --   --   --   --   ALKPHOS 40  --   --   --   --   BILITOT 0.6  --   --   --   --   GFRNONAA >60   < > 29* 55* >60  ANIONGAP 9   < > 10 7 10    < > = values in this interval not displayed.     Hematology Recent Labs  Lab 06/12/20 1120 06/13/20 0422 06/14/20 0131  WBC 7.4 7.5 10.5  RBC 2.63* 2.46* 2.64*  HGB 8.5* 8.0* 8.6*  HCT 25.3* 23.6* 25.2*  MCV 96.2 95.9 95.5  MCH 32.3 32.5 32.6  MCHC 33.6 33.9 34.1  RDW 13.2 13.1 12.9  PLT 141* 128* 178    BNPNo results for input(s): BNP, PROBNP in the last 168 hours.   DDimer No results for input(s): DDIMER in the last 168 hours.   Radiology    DG Chest Port 1 View  Result Date: 06/14/2020 CLINICAL DATA:  Congestive failure EXAM: PORTABLE CHEST 1 VIEW COMPARISON:  06/12/2020 FINDINGS: Cardiac shadow remains enlarged. Postsurgical changes are again seen. Mediastinal drain and bilateral thoracostomy catheters are noted. No pneumothorax is noted. Pacing device is well visualized and stable in appearance from the prior exam. Lungs are well aerated bilaterally. No focal confluent infiltrate is  seen. No bony abnormality is noted. Pericardial drain is stable. IMPRESSION: Postsurgical changes with tubes and lines as described. No new focal abnormality is noted. Electronically Signed   By: Inez Catalina M.D.   On: 06/14/2020 08:33    Cardiac Studies   Pre-op TTE 06/03/20: IMPRESSIONS  1. Severe mitral valve regurgitation. Mechanism is secondary MR,  Carpentier IIIb. Multiple jets are seen, with a posteriorly directed  eccentric jet due to posterior leaflet restriction. Systolic blunting of  pulmonary vein Doppler. The mitral valve is  grossly normal in appearance, with small degenerative area of P3 scallop  without prolapse or flail. Severe mitral valve regurgitation.  2. Left ventricular ejection fraction, by estimation, is 35 to 40%. The  left ventricle has moderately decreased function. The left ventricle  demonstrates regional wall motion abnormalities inferior and lateral.  3. Right ventricular systolic function is normal. The right ventricular  size is normal.  4. Left atrial size was moderately dilated. No left atrial/left atrial  appendage thrombus was detected.  5. Right atrial size was mildly dilated.  6. The aortic valve is grossly normal. There is mild calcification of the  aortic valve. Aortic valve regurgitation is mild.  7. There is Moderate (Grade III) atheroma plaque involving the transverse  and descending aorta.   Patient Profile     75 y.o. 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   Assessment & Plan    #Severe Mitral Regurgitation s/p MV Repair: Now s/p MV repair with Dr. Roxy Manns. -Post-op management  per CV surgery -On low dose warfarin -Resumed diuretics  #Multivessel CAD s/p CABG: S/p  3v CABG with Dr. Roxy Manns. -Continue ASA 81mg  and atorvastatin 40mg  daily -Continue coreg 3.125mg  BID -Continue spironolactone 25mg  daily -Agree with starting low dose entresto as tolerated--likely tomorrow given improvement in renal function  #Acute on Chronic HFrEF: Patient has history of nonischemic cardiomyopathy with LVEF 35-40%. S/p BiV PPM placement. Now with newly discovered multivessel CAD as detailed above. Appears clinically volume overloaded. Resumed on diuresis per CV surgery. -Follow-up TTE -Continue lasix 40mg  IV BID -Continue coreg 3.125mg  BID -Agree with starting low dose entresto pending renal function and blood pressure--likely tomorrow -Continue spironolactone 25mg  daily -Will try to add SGLT-2i prior to discharge -Monitor I/Os and daily weights -Low Na diet  #AKI: Likely ATN. Now improving. -Resume diuresis as above -Trend  #Hyponatremia: Likely hypervolemic hypernatremia in the setting of volume overload. -Resumed diuresis as above -Trend with diuresis  #Paroxysmal Afib/flutter: S/p MAZE procedure with LAA clipping. -Continue coreg -Warfarin per CV surgery  #History of CHB: -S/p BiV PPM placement  For questions or updates, please contact [facility] Please consult [email or web address] for contact info under        Signed, Freada Bergeron, MD  06/14/2020, 6:57 PM

## 2020-06-14 NOTE — Progress Notes (Signed)
Mobility Specialist: Progress Note   06/14/20 1320  Mobility  Activity Ambulated in hall  Level of Assistance Independent after set-up  Assistive Device Front wheel walker  Distance Ambulated (ft) 430 ft  Mobility Response Tolerated well  Mobility performed by Mobility specialist  Bed Position Chair  $Mobility charge 1 Mobility   Pre-Mobility: 91 HR, 99% SpO2 Post-Mobility: 101 HR, 113/60 BP, 100% SpO2  Pt asx during ambulation. Pt said he has noticed changes in his vision. When describing it to mobility specialist pt is having a hard time seeing out of his R eye. Pt said as he reads from L to R his vision gets "blurry" and does not change when he wears his glasses. Pt expressed he noticed the change sometime throughout the night, RN notified. Pt to chair after walk and is eating lunch.   [facility] Dashayla Theissen Mobility Specialist Mobility Specialist Phone: [phone]

## 2020-06-14 NOTE — Progress Notes (Signed)
Progress Note  Patient Name: Ian Mcgee Date of Encounter: 06/14/2020  [facility] Cardiologist: Jenne Campus, MD   Subjective   States that yesterday was difficult as he was struggling with constipation and then took a bunch of laxatives and had diarrhea overnight. Symptoms improved this morning. Breathing is improving slowly. Able to ambulate the hallway.  Na 126 from 127 yesterday Cr improving to 1.08 Resumed on diuretics  Inpatient Medications    Scheduled Meds: . acetaminophen  1,000 mg Oral Q6H  . aspirin EC  81 mg Oral Daily  . atorvastatin  40 mg Oral Daily  . bisacodyl  10 mg Oral Daily   Or  . bisacodyl  10 mg Rectal Daily  . carvedilol  3.125 mg Oral BID WC  . chlorhexidine  15 mL Mouth Rinse BID  . docusate sodium  200 mg Oral Daily  . enoxaparin (LOVENOX) injection  30 mg Subcutaneous QHS  . ferrous [id number] C-folic acid  1 capsule Oral Q breakfast  . furosemide  40 mg Intravenous BID  . mouth rinse  15 mL Mouth Rinse q12n4p  . pantoprazole  40 mg Oral Daily  . polyethylene glycol  17 g Oral Daily  . potassium chloride  20 mEq Oral BID WC  . sodium chloride flush  3 mL Intravenous Q12H  . spironolactone  25 mg Oral Daily  . warfarin  5 mg Oral q1600  . Warfarin - Physician Dosing Inpatient   Does not apply q1600   Continuous Infusions: . sodium chloride Stopped (06/13/20 1549)   PRN Meds: sodium chloride, ondansetron (ZOFRAN) IV, oxyCODONE, sodium chloride flush, traMADol   Vital Signs    Vitals:   06/14/20 0355 06/14/20 0832 06/14/20 1000 06/14/20 1030  BP: 140/75 (!) 150/70 (!) 147/74 133/84  Pulse: 96 96 95 93  Resp: 18 20 18 19   Temp: 98.1 F (36.7 C) 99.2 F (37.3 C)    TempSrc: Oral Oral    SpO2: 92% 97% 95% 100%  Weight:      Height:        Intake/Output Summary (Last 24 hours) at 06/14/2020 1132 Last data filed at 06/14/2020 1122 Gross per 24 hour  Intake 1.46 ml  Output 2995 ml  Net -2993.54 ml   Last 3  Weights 06/14/2020 06/13/2020 06/12/2020  Weight (lbs) 165 lb 1.6 oz 164 lb 7.4 oz 171 lb 8.3 oz  Weight (kg) 74.889 kg 74.6 kg 77.8 kg      Telemetry    AV paced - Personally Reviewed  ECG    No new tracing - Personally Reviewed  Physical Exam   GEN: No acute distress.  Sitting up in a chair Neck: +JVD Cardiac: RRR, no murmurs, rubs, or gallops. Sternotomy site c/d/i. Respiratory: Clear to auscultation bilaterally. GI: Soft, nontender, non-distended  MS: Trace edema Neuro:  Nonfocal  Psych: Normal affect   Labs    High Sensitivity Troponin:   Recent Labs  Lab 06/05/20 1934 06/05/20 2220 06/06/20 0608  TROPONINIHS 27* 31* 27*      Chemistry Recent Labs  Lab 06/09/20 0034 06/10/20 0606 06/12/20 0409 06/13/20 0422 06/14/20 0131  NA 138   < > 129* 127* 126*  K 3.7   < > 4.5 4.1 4.2  CL 103   < > 98 97* 95*  CO2 26   < > 21* 23 21*  GLUCOSE 82   < > 119* 107* 106*  BUN 14   < > 31* 32* 24*  CREATININE 0.87   < > 2.31* 1.35* 1.08  CALCIUM 9.0   < > 8.0* 7.9* 8.3*  PROT 5.9*  --   --   --   --   ALBUMIN 3.6  --   --   --   --   AST 23  --   --   --   --   ALT 20  --   --   --   --   ALKPHOS 40  --   --   --   --   BILITOT 0.6  --   --   --   --   GFRNONAA >60   < > 29* 55* >60  ANIONGAP 9   < > 10 7 10    < > = values in this interval not displayed.     Hematology Recent Labs  Lab 06/12/20 1120 06/13/20 0422 06/14/20 0131  WBC 7.4 7.5 10.5  RBC 2.63* 2.46* 2.64*  HGB 8.5* 8.0* 8.6*  HCT 25.3* 23.6* 25.2*  MCV 96.2 95.9 95.5  MCH 32.3 32.5 32.6  MCHC 33.6 33.9 34.1  RDW 13.2 13.1 12.9  PLT 141* 128* 178    BNPNo results for input(s): BNP, PROBNP in the last 168 hours.   DDimer No results for input(s): DDIMER in the last 168 hours.   Radiology    DG Chest Port 1 View  Result Date: 06/14/2020 CLINICAL DATA:  Congestive failure EXAM: PORTABLE CHEST 1 VIEW COMPARISON:  06/12/2020 FINDINGS: Cardiac shadow remains enlarged. Postsurgical changes are  again seen. Mediastinal drain and bilateral thoracostomy catheters are noted. No pneumothorax is noted. Pacing device is well visualized and stable in appearance from the prior exam. Lungs are well aerated bilaterally. No focal confluent infiltrate is seen. No bony abnormality is noted. Pericardial drain is stable. IMPRESSION: Postsurgical changes with tubes and lines as described. No new focal abnormality is noted. Electronically Signed   By: Inez Catalina M.D.   On: 06/14/2020 08:33   DG Chest Port 1 View  Result Date: 06/12/2020 CLINICAL DATA:  CABG June 10, 2020. EXAM: PORTABLE CHEST 1 VIEW COMPARISON:  June 12, 2020 FINDINGS: Mediastinal drain and chest tubes are stable. No pneumothorax. Stable pacemaker. Stable valve replacement. Stable cardiomegaly. The hila and mediastinum are unremarkable. Mild bilateral opacities in the right base and left midlung are identified. Probable atelectasis and effusion in the left base. IMPRESSION: 1. Support apparatus as above. 2. Mild bilateral pulmonary opacities could represent atelectasis or infiltrate. Recommend attention on follow-up. 3. Probable small left effusion with underlying atelectasis. Electronically Signed   By: Dorise Bullion III M.D   On: 06/12/2020 13:58    Cardiac Studies   Pre-op TTE 06/03/20: IMPRESSIONS  1. Severe mitral valve regurgitation. Mechanism is secondary MR,  Carpentier IIIb. Multiple jets are seen, with a posteriorly directed  eccentric jet due to posterior leaflet restriction. Systolic blunting of  pulmonary vein Doppler. The mitral valve is  grossly normal in appearance, with small degenerative area of P3 scallop  without prolapse or flail. Severe mitral valve regurgitation.  2. Left ventricular ejection fraction, by estimation, is 35 to 40%. The  left ventricle has moderately decreased function. The left ventricle  demonstrates regional wall motion abnormalities inferior and lateral.  3. Right ventricular systolic  function is normal. The right ventricular  size is normal.  4. Left atrial size was moderately dilated. No left atrial/left atrial  appendage thrombus was detected.  5. Right atrial size was mildly dilated.  6. The aortic valve is grossly normal. There is mild calcification of the  aortic valve. Aortic valve regurgitation is mild.  7. There is Moderate (Grade III) atheroma plaque involving the transverse  and descending aorta.   Patient Profile     75 y.o. 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   Assessment & Plan    #Severe Mitral Regurgitation s/p MV Repair: Now s/p MV repair with Dr. Roxy Manns. -Post-op management per CV surgery -On low dose warfarin -Resumed diuretics  #Multivessel CAD s/p CABG: S/p 3v CABG with Dr. Roxy Manns. -Continue ASA 81mg  and atorvastatin 40mg  daily -Continue coreg 3.125mg  BID -Continue spironolactone 25mg  daily -Agree with starting low dose entresto as tolerated--likely tomorrow given improvement in renal function  #Acute on Chronic HFrEF: Patient has history of nonischemic cardiomyopathy with LVEF 35-40%. S/p BiV PPM placement. Now with newly discovered multivessel CAD as detailed above. Appears clinically volume overloaded. Resumed on diuresis per CV surgery. -Follow-up TTE -Continue lasix 40mg  IV BID -Continue coreg 3.125mg  BID -Agree with starting low dose entresto pending renal function and blood pressure--likely tomorrow -Continue spironolactone 25mg  daily -Will try to add SGLT-2i prior to discharge -Monitor I/Os and daily weights -Low Na diet  #AKI: Likely ATN. Now improving. -Resume diuresis as  above -Trend  #Hyponatremia: Likely hypervolemic hypernatremia in the setting of volume overload. -Resumed diuresis as above -Trend with diuresis  #Paroxysmal Afib/flutter: S/p MAZE procedure with LAA clipping. -Continue coreg -Warfarin per CV surgery  #History of CHB: -S/p BiV PPM placement   For questions or updates, please contact [facility] Please consult [email or web address] for contact info under        Signed, Freada Bergeron, MD  06/14/2020, 11:32 AM

## 2020-06-14 NOTE — Consult Note (Signed)
Advanced Heart Failure Team Consult Note   Primary Physician:Khan, Jaber Primary Cardiologist: Dr. Jenne Campus Primary CT surgeon:  Dr. Darylene Price  Reason for Consultation: systolic CHF med titration   HPI:    Ian Mcgee is seen today for systolic CHF med titration  at the request of Dr. Roxy Manns.   Ian Mcgee is a 75 y.o. 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 Agustin Cree for years for moderate MR, CAD, complete heart block s/p PPM.  Began having decreased exercise capacity in 2019.  At that time had an echo with EF 40% and anterior wall akinesis. Given his symptoms and echo findings he underwent LHC which demonstrated diffuse moderate to severe RCA and LAD disease, severe MR and the thought was that given LHC findings his cardiomyopathy was due to pacemaker syndrome.  Referred to EP for consideration of LV lead placement, and medical therapy was intensified but he had difficulty tolerating much GDMT.    Received BIV pacer on 06/2017.  Remained active playing tennis, did not want to take antiplatelet therapy due to bruising was followed by lipid clinic but could never really tolerate lipid lowering therapy.  By 09/2017 EF up to 50-55%, MR had improved to mild from severe. Similar findings on 2020 ECHO.     05/27/2020 saw his primary cardiologist and discussed he experienced significantly increased shortness of breath very difficult to play tennis, had an episode of PND.  Decision was made to proceed with repeat LHC:     Also had a repeat ECHO 05/2020 with EF 40%, moderate-severe MR, normal RV function, G2DD.     Shortly after LHC returned to the hospital 4/23  for chest pain and dyspnea on exertion while working.  CXR- neg for pulm edema, clear lungs, BP 178/98, HR 82.  Trop 27-> 31. BNP 320 CT surgery consulted and patient underwent CABG x3 and MVR with maze procedure given history of PAF/ AFL as well as LAA clip.  Did have post op AKI which has improved.  Currently undergoing titration of GDMT and post op diuresis.    He is POD#4.  Says he is feeling much better than yesterday, main symptoms have been related to post op constipation then diarrhea.  Reports he is still short of breath with exertion, tasks that used to be easy like getting up and walking down the hall he feels short of breath doing now. Minimal chest discomfort post op.    Review of Systems: [y] = yes, [ ]  = no   General: Weight gain [ ] ; Weight loss [ ] ; Anorexia [ ] ; Fatigue Blue.Reese ]; Fever [ ] ; Chills [ ] ; Weakness Blue.Reese ]  Cardiac: Chest pain/pressure [ ] ; Resting SOB [ ] ; Exertional SOB Blue.Reese ]; Orthopnea [ ] ; Pedal Edema [ y]; Palpitations [ ] ; Syncope [ ] ; Presyncope [ ] ; Paroxysmal nocturnal dyspnea[ ]   Pulmonary: Cough Blue.Reese ]; Wheezing[ ] ; Hemoptysis[ ] ; Sputum [ ] ; Snoring [ ]   GI: Vomiting[ ] ; Dysphagia[ ] ; Melena[ ] ; Hematochezia [ ] ; Heartburn[ ] ; Abdominal pain [ ] ; Constipation [ ] ; Diarrhea [ ] ; BRBPR [ ]   GU: Hematuria[ ] ; Dysuria [ ] ; Nocturia[ ]   Vascular: Pain in legs  with walking [ ] ; Pain in feet with lying flat [ ] ; Non-healing sores [ ] ; Stroke [ ] ; TIA [ ] ; Slurred speech [ ] ;  Neuro: Headaches[ ] ; Vertigo[ ] ; Seizures[ ] ; Paresthesias[ ] ;Blurred vision [ ] ; Diplopia [ ] ; Vision changes [ ]   Ortho/Skin: Arthritis Blue.Reese ]; Joint pain Blue.Reese ]; Muscle pain [ ] ; Joint swelling [ ] ; Back Pain [ ] ; Rash [ ]   Psych: Depression[ ] ; Anxiety[ ]   Heme: Bleeding problems [ ] ; Clotting disorders [ ] ; Anemia Blue.Reese ]  Endocrine: Diabetes [ ] ; Thyroid dysfunction[ ]   Home Medications Prior to Admission medications   Medication Sig Start Date End Date Taking? Authorizing Provider  aspirin EC 81 MG  EC tablet Take 1 tablet (81 mg total) by mouth daily. Swallow whole. 05/30/20  Yes Duke, Tami Lin, PA  B-12, Methylcobalamin, 1000 MCG SUBL Place 1,000 mcg under the tongue every morning. 12/26/19  Yes [provider]  furosemide (LASIX) 40 MG tablet Take 40 mg daily for weight gain of 3 lbs overnight or 5 lbs in 1 week. Patient taking differently: Take 40 mg by mouth daily as needed (weight gain of 3 lbs overnight or 5 lbs in 1 week). 05/29/20  Yes Duke, Tami Lin, PA  Multiple Vitamin (MULTIVITAMIN) capsule Take 1 capsule by mouth every morning.   Yes [provider]  nitroGLYCERIN (NITROSTAT) 0.4 MG SL tablet Place 1 tablet (0.4 mg total) under the tongue every 5 (five) minutes x 3 doses as needed for chest pain. 05/29/20  Yes Duke, Tami Lin, PA  Probiotic Product (PROBIOTIC PO) Take 1 tablet by mouth every morning.   Yes [provider]  psyllium (METAMUCIL) 58.6 % packet Take 1 packet by mouth every morning.   Yes [provider]  amoxicillin (AMOXIL) 500 MG capsule Take 500 mg by mouth every 6 (six) hours. 06/01/20   [provider]    Past Medical History: Past Medical History:  Diagnosis Date  . Attention deficit disorder (ADD)   . Cardiac pacemaker 05/10/2017  . Cardiomyopathy ([place])   . Complete heart block ([place]) 06/10/2015  . Coronary artery disease    40% left main, 50% LAD, 50% RCA recent cardiac catheterization from March 2019  . Dyslipidemia   . Mitral regurgitation   . Peripheral vascular disease ([place])    Noncritical bilateral carotid arterial disease  . Pneumonia   . S/P CABG x 3 06/10/2020   LIMA to LAD, SVG to Diag, SVG to PDA  . S/P Maze operation for atrial fibrillation 06/10/2020   Left atrial lesion set using bipolar radiofrequency and cryothermy ablation with clipping of LA appendage  . S/P mitral valve repair 06/10/2020   Annuloplasty with a M28 mm Model [address] [place]  SN: [id number]    .  Status post biventricular pacemaker 07/16/2017  . Syncope and collapse     Past Surgical History: Past Surgical History:  Procedure Laterality Date  . Basel Cell Cancer    . BIV UPGRADE N/A 06/25/2017   Procedure: BIV UPGRADE;  Surgeon: Constance Haw, MD;  Location: [facility];  Service: Cardiovascular;  Laterality: N/A;  . BUBBLE STUDY  06/03/2020   Procedure: BUBBLE STUDY;  Surgeon: [name], MD;  Location: [facility] ENDOSCOPY;  Service: Cardiology;;  . CATARACT EXTRACTION    . CLIPPING OF ATRIAL APPENDAGE  06/10/2020   Procedure: CLIPPING OF ATRIAL APPENDAGE USING  ATRICURE PRO2 CLIP SIZE 45MM;  Surgeon: Rexene Alberts, MD;  Location: [facility] OR;  Service: Open Heart Surgery;;  . CORONARY ARTERY BYPASS GRAFT N/A 06/10/2020   Procedure: CORONARY ARTERY BYPASS GRAFTING (CABG) X THREE USING LEFT INTERNAL MAMMARY ARTERY AND BILATERAL LEGS GREATER SAPHENOUS VEIN HARVESTED ENDOSCOPICALLY;  Surgeon: Rexene Alberts, MD;  Location: [place];  Service: Open Heart Surgery;  Laterality: N/A;  . INSERT / REPLACE / REMOVE PACEMAKER    . INTRAVASCULAR PRESSURE WIRE/FFR STUDY N/A 05/28/2020   Procedure: INTRAVASCULAR PRESSURE WIRE/FFR STUDY;  Surgeon: [name], MD;  Location: [place] [facility];  Service: Cardiovascular;  Laterality: N/A;  . KNEE SURGERY Right    ACL Reconstruction  . LEFT HEART CATH AND CORONARY ANGIOGRAPHY N/A 05/10/2017   Procedure: LEFT HEART CATH AND CORONARY ANGIOGRAPHY;  Surgeon: [name], MD;  Location: [facility];  Service: Cardiovascular;  Laterality: N/A;  . MAZE N/A 06/10/2020   Procedure: MAZE;  Surgeon: Rexene Alberts, MD;  Location: [facility];  Service: Open Heart Surgery;  Laterality: N/A;  . MITRAL VALVE REPAIR N/A 06/10/2020   Procedure: MITRAL VALVE REPAIR USING CARPENTIER MCCARTHY-ADAMS RING SIZE 28MM;  Surgeon: Rexene Alberts, MD;  Location: [facility];  Service: Open Heart Surgery;  Laterality: N/A;  . RIGHT/LEFT HEART CATH AND CORONARY  ANGIOGRAPHY N/A 05/28/2020   Procedure: RIGHT/LEFT HEART CATH AND CORONARY ANGIOGRAPHY;  Surgeon: [name], MD;  Location: [facility];  Service: Cardiovascular;  Laterality: N/A;  . TEE WITHOUT CARDIOVERSION N/A 06/03/2020   Procedure: TRANSESOPHAGEAL ECHOCARDIOGRAM (TEE);  Surgeon: [name], MD;  Location: [facility];  Service: Cardiology;  Laterality: N/A;  . TEE WITHOUT CARDIOVERSION N/A 06/10/2020   Procedure: TRANSESOPHAGEAL ECHOCARDIOGRAM (TEE);  Surgeon: Rexene Alberts, MD;  Location: [facility];  Service: Open Heart Surgery;  Laterality: N/A;  . VASECTOMY      Family History: Family History  Problem Relation Age of Onset  . CAD Mother   . Hypertension Mother   . Osteoporosis Mother   . CAD Father   . Leukemia Father   . Ovarian cancer Sister   . Healthy Brother   . CAD Brother   . Rheumatic fever Brother     Social History: Social History   Socioeconomic History  . Marital status: Married    Spouse name: Not on file  . Number of children: Not on file  . Years of education: Not on file  . Highest education level: Not on file  Occupational History  . Occupation: Engineer, drilling  Tobacco Use  . Smoking status: Former Smoker    Types: Cigarettes  . Smokeless tobacco: Never Used  Vaping Use  . Vaping Use: Never used  Substance and Sexual Activity  . Alcohol use: Yes    Comment: Wine with dinner  . Drug use: Never  . Sexual activity: Not on file  Other Topics Concern  . Not on file  Social History Narrative  . Not on file   Social Determinants of Health   Financial Resource Strain: Not on file  Food Insecurity: Not on file  Transportation Needs: Not on file  Physical Activity: Not on file  Stress: Not on file  Social Connections: Not on file    Allergies:  Allergies  Allergen Reactions  . Ciprofloxacin Other (See Comments)    Bleeding (intolerance)  . Crestor [Rosuvastatin Calcium] Other (See Comments)    Muscle  twinges  . Nexletol [Bempedoic Acid] Other (See Comments)    Muscle and joint pain    Objective:  Vital Signs:   Temp:  [97.6 F (36.4 C)-99.2 F (37.3 C)] 98.2 F (36.8 C) (05/02 1209) Pulse Rate:  [84-96] 91 (05/02 1209) Resp:  [15-20] 15 (05/02 1209) BP: (117-153)/(68-84) 117/68 (05/02 1209) SpO2:  [92 %-100 %] 100 % (05/02 1209) Weight:  [74.9 kg] 74.9 kg (05/02 0118) Last BM Date: 06/14/20  Weight change: Filed Weights   06/12/20 0352 06/13/20 0400 06/14/20 0118  Weight: 77.8 kg 74.6 kg 74.9 kg    Intake/Output:   Intake/Output Summary (Last 24 hours) at 06/14/2020 1505 Last data filed at 06/14/2020 1207 Gross per 24 hour  Intake 0 ml  Output 3070 ml  Net -3070 ml      Physical Exam    General:  Well appearing. No resp difficulty HEENT: normal Neck: supple. JVP 8 . Carotids 2+ bilat; no bruits. No lymphadenopathy or thyromegaly appreciated. Cor: chest incision site clean, dry, intact, PMI nondisplaced. Regular rate & rhythm. No rubs, 2/6 systolic murmur Lungs: clear Abdomen: soft, nontender, nondistended. No hepatosplenomegaly. No bruits or masses. Good bowel sounds. Extremities: no cyanosis, clubbing, rash, edema Neuro: alert & orientedx3, cranial nerves grossly intact. moves all 4 extremities w/o difficulty. Affect pleasant   Telemetry   Atrial sensed, BIV paced rate 87  EKG    No new ecg  Labs   Basic Metabolic Panel: Recent Labs  Lab 06/10/20 2136 06/11/20 0500 06/11/20 1624 06/11/20 2323 06/12/20 0409 06/13/20 0422 06/14/20 0131  NA 136 134* 131* 129* 129* 127* 126*  K 3.6 5.0 4.8 4.5 4.5 4.1 4.2  CL 108 105 102 102 98 97* 95*  CO2 23 23 19* 20* 21* 23 21*  GLUCOSE 151* 123* 143* 127* 119* 107* 106*  BUN 14 16 23  29* 31* 32* 24*  CREATININE 0.81 1.00 1.88* 2.24* 2.31* 1.35* 1.08  CALCIUM 7.2* 7.8* 7.9* 7.5* 8.0* 7.9* 8.3*  MG 2.7* 2.5* 2.5*  --   --   --   --     Liver Function Tests: Recent Labs  Lab 06/09/20 0034  AST 23   ALT 20  ALKPHOS 40  BILITOT 0.6  PROT 5.9*  ALBUMIN 3.6   No results for input(s): LIPASE, AMYLASE in the last 168 hours. No results for input(s): AMMONIA in the last 168 hours.  CBC: Recent Labs  Lab 06/11/20 1624 06/12/20 0409 06/12/20 1120 06/13/20 0422 06/14/20 0131  WBC 10.5 5.9 7.4 7.5 10.5  HGB 9.3* 8.5* 8.5* 8.0* 8.6*  HCT 27.9* 25.2* 25.3* 23.6* 25.2*  MCV 96.5 96.6 96.2 95.9 95.5  PLT 142* 132* 141* 128* 178    Cardiac Enzymes: No results for input(s): CKTOTAL, CKMB, CKMBINDEX, TROPONINI in the last 168 hours.  BNP: BNP (last 3 results) Recent Labs    06/05/20 1936  BNP 320.0*    ProBNP (last 3 results) Recent Labs    05/27/20 1535  PROBNP 1,028*     CBG: Recent Labs  Lab 06/12/20 2127 06/13/20 0645 06/13/20 1118 06/13/20 1828 06/13/20 2038  GLUCAP 104* 93 103* 129* 117*    Coagulation Studies: Recent Labs    06/12/20 0409 06/13/20 0422 06/14/20 0131  LABPROT 16.1* 14.9 14.9  INR 1.3* 1.2 1.2     Imaging   DG Chest Port 1 View  Result Date: 06/14/2020 CLINICAL DATA:  Congestive failure EXAM: PORTABLE CHEST 1 VIEW COMPARISON:  06/12/2020 FINDINGS: Cardiac shadow remains enlarged. Postsurgical changes are again seen. Mediastinal drain and bilateral thoracostomy catheters are noted. No pneumothorax is noted. Pacing device is well  visualized and stable in appearance from the prior exam. Lungs are well aerated bilaterally. No focal confluent infiltrate is seen. No bony abnormality is noted. Pericardial drain is stable. IMPRESSION: Postsurgical changes with tubes and lines as described. No new focal abnormality is noted. Electronically Signed   By: Inez Catalina M.D.   On: 06/14/2020 08:33      Medications:     Current Medications: . acetaminophen  1,000 mg Oral Q6H  . aspirin EC  81 mg Oral Daily  . atorvastatin  40 mg Oral Daily  . bisacodyl  10 mg Oral Daily   Or  . bisacodyl  10 mg Rectal Daily  . carvedilol  3.125 mg Oral  BID WC  . chlorhexidine  15 mL Mouth Rinse BID  . docusate sodium  200 mg Oral Daily  . enoxaparin (LOVENOX) injection  30 mg Subcutaneous QHS  . ferrous [id number] C-folic acid  1 capsule Oral Q breakfast  . furosemide  40 mg Intravenous BID  . mouth rinse  15 mL Mouth Rinse q12n4p  . pantoprazole  40 mg Oral Daily  . polyethylene glycol  17 g Oral Daily  . potassium chloride  20 mEq Oral BID WC  . sacubitril-valsartan  1 tablet Oral BID  . sodium chloride flush  3 mL Intravenous Q12H  . spironolactone  25 mg Oral Daily  . warfarin  5 mg Oral q1600  . Warfarin - Physician Dosing Inpatient   Does not apply q1600     Infusions: . sodium chloride Stopped (06/13/20 1549)        Assessment/Plan   Acute on Chronic combined systolic and diastolic CHF: -Patient has history of nonischemic cardiomyopathy with LVEF 35-40% 2019. S/p BiV PPM placement with improvement in EF . Now likely an element of ischemic cardiomyopathy given progression of CAD -ECHO 05/2020 with EF 40%, moderate-severe MR, normal RV function, G2DD. -LHC 05/2020 with multivessel disease now s/p CABG x3 and MVR with maze procedure -Currently NYHA class III symptoms, mildly volume overloaded -Continue lasix 40mg  IV BID, good urine output getting closer to euvolemia -Continue coreg 3.125mg  BID, spironolactone 25mg  daily -Received lisinopril this am so agree with bridging to low dose entresto with losartan.  I think it is worth trying again, suspect he had some symptoms while playing tennis on it because his body was not used to it.  -Will try to add SGLT-2i prior to discharge -Monitor renal fx with diuresis  Severe MR s/p MV Repair: -Intraop TEE with moderate MR.   -No residual MR on post repair TEE -On warfarin which is being titrated  Multivessel CAD s/p CABG: -POD 4 from 3v CABG with Dr. Roxy Manns. -Continue ASA 81mg , warfarin and atorvastatin 40mg  daily -Continue coreg 3.125mg  BID, spironolactone 25mg   daily -Agree with bridging to low dose entresto with losartan -Will try to add SGLT-2i prior to discharge  AKI: -Cr peaked at 2.31, Likely ATN. -Currently Cr 1.08 -Resume diuresis as above -follow renal fx with diuresis  Hyponatremia: -Likely hypervolemic hypernatremia in the setting of volume overload. Possible component of pain associated hyponatremia -Resumed diuresis as above -Trend with diuresis  Paroxysmal Afib/flutter: -S/p MAZE procedure with LAA clipping. -Continue coreg -Warfarin per CV surgery  History of CHB: -S/p BiV PPM placement, functioning appropriately   Length of Stay: 9  Katherine Roan, MD  06/14/2020, 3:05 PM  Advanced Heart Failure Team Pager [phone] (M-F; 7a - 4p)  Please contact [facility] for night-coverage after hours (4p -7a ) and  weekends on [email or web address]  Patient seen and examined with the above-signed Advanced Practice Provider and/or Housestaff. I personally reviewed laboratory data, imaging studies and relevant notes. I independently examined the patient and formulated the important aspects of the plan. I have edited the note to reflect any of my changes or salient points. I have personally discussed the plan with the patient and/or family.  HF team asked by Dr. Roxy Manns to help optimize HF meds prior to d/c  75 y/o male with h/o CHB and RV pacing-induced CM s/p CRT upgrade. Now admitted with angina and acute systolic HF.   Found to have severe CAD including high grade ostial LAD disease, EF 40% and severe MR. Now s/p CABG, Maze and LAA clipping.   Has tolerated HF meds poorly in past.   Echo EF ~35%. IV lasix started today for volume overload. Also started on low-dose lisinopril   General:  Sitting up in bed. No resp difficulty HEENT: normal Neck: supple. JVP to jaw. Carotids 2+ bilat; no bruits. No lymphadenopathy or thryomegaly appreciated. Cor: PMI nondisplaced. Sternal wound ok Regular rate & rhythm. No rubs, gallops or  murmurs. Lungs: decreased in basese Abdomen: soft, nontender, nondistended. No hepatosplenomegaly. No bruits or masses. Good bowel sounds. Extremities: no cyanosis, clubbing, rash, tr edema Neuro: alert & orientedx3, cranial nerves grossly intact. moves all 4 extremities w/o difficulty. Affect pleasant  Agree with continued IV lasix. Will transition ACE -> ARB -> entresto (got 1 dose lisinopril so have to wait 36 hour for Entresto). Continue spiro and low-dose carvedilol. We will see how he tolerates. Would be nice to get SGLT2i on board prior to d/c but may have to wait until post d/c. If Na drops <= 125 will give a dose of tolvaptan. Limit free water. We will follow.   Glori Bickers, MD  11:41 PM

## 2020-06-14 NOTE — Progress Notes (Addendum)
[address]       [place] [address]             [phone]      4 Days Post-Op Procedure(s) (LRB): MITRAL VALVE REPAIR USING CARPENTIER MCCARTHY-ADAMS RING SIZE 28MM (N/A) CORONARY ARTERY BYPASS GRAFTING (CABG) X THREE USING LEFT INTERNAL MAMMARY ARTERY AND BILATERAL LEGS GREATER SAPHENOUS VEIN HARVESTED ENDOSCOPICALLY (N/A) MAZE (N/A) TRANSESOPHAGEAL ECHOCARDIOGRAM (TEE) (N/A) CLIPPING OF ATRIAL APPENDAGE USING  ATRICURE PRO2 CLIP SIZE 45MM   Subjective:  No new complaints. Had a rough night with multiple episodes of diarrhea after his laxative.  Ambulated yesterday with minimal assistance, he did state it was difficult to take a good deep breath during his walk without pain.  Objective: Vital signs in last 24 hours: Temp:  [97.6 F (36.4 C)-98.5 F (36.9 C)] 98.1 F (36.7 C) (05/02 0355) Pulse Rate:  [78-98] 96 (05/02 0355) Cardiac Rhythm: Other (Comment) (05/01 1900) Resp:  [18-19] 18 (05/02 0355) BP: (132-165)/(74-97) 140/75 (05/02 0355) SpO2:  [90 %-100 %] 92 % (05/02 0355) Weight:  [74.9 kg] 74.9 kg (05/02 0118)  Intake/Output from previous day: 05/01 0701 - 05/02 0700 In: 12.1 [I.V.:12.1] Out: 915 [Urine:280; Chest Tube:635]  General appearance: alert, cooperative and no distress Heart: regular rate and rhythm Lungs: diminished breath sounds on left Abdomen: soft, non-tender; bowel sounds normal; no masses,  no organomegaly Extremities: edema trace Wound: clean and dry  Lab Results: Recent Labs    06/13/20 0422 06/14/20 0131  WBC 7.5 10.5  HGB 8.0* 8.6*  HCT 23.6* 25.2*  PLT 128* 178   BMET:  Recent Labs    06/13/20 0422 06/14/20 0131  NA 127* 126*  K 4.1 4.2  CL 97* 95*  CO2 23 21*  GLUCOSE 107* 106*  BUN 32* 24*  CREATININE 1.35* 1.08  CALCIUM 7.9* 8.3*    PT/INR:  Recent Labs    06/14/20 0131  LABPROT 14.9  INR 1.2   ABG    Component Value Date/Time   PHART 7.303 (L) 06/10/2020 1812   HCO3 23.6  06/10/2020 1812   TCO2 25 06/10/2020 1812   ACIDBASEDEF 3.0 (H) 06/10/2020 1812   O2SAT 57.5 06/13/2020 0415   CBG (last 3)  Recent Labs    06/13/20 1118 06/13/20 1828 06/13/20 2038  GLUCAP 103* 129* 117*    Assessment/Plan: S/P Procedure(s) (LRB): MITRAL VALVE REPAIR USING CARPENTIER MCCARTHY-ADAMS RING SIZE 28MM (N/A) CORONARY ARTERY BYPASS GRAFTING (CABG) X THREE USING LEFT INTERNAL MAMMARY ARTERY AND BILATERAL LEGS GREATER SAPHENOUS VEIN HARVESTED ENDOSCOPICALLY (N/A) MAZE (N/A) TRANSESOPHAGEAL ECHOCARDIOGRAM (TEE) (N/A) CLIPPING OF ATRIAL APPENDAGE USING  ATRICURE PRO2 CLIP SIZE 45MM  1. CV- NSR, EPW out yesterday-no BB currently, + HTN at times, will see how he does with diuretics, could start ARB prior to discharge if indicated 2. INR minimal response to coumadin, dose increased to 5 mg daily 3. Pulm- CXR with left pleural effusion, CT output >600. Mediastinal chest tubes to be removed as ordered by Dr. Roxy Manns 4. Renal- creatinine improved at 1.08, weight is slightly elevated.Marland Kitchen Spironolactone, Lasix as ordered per Dr. Roxy Manns, K is at 4.2. watch closely with supplementation ordered and use of Spiro... Hyponatremic follow 5. GI- constipation, no BM since 4/27, Miralax is ordered 6. Expected post operative blood loss anemia, moderaye hgb at 8.5. iron started 7. Dispo- patient stable, no new issues, increase coumadin for MV Repair, watch BP may need to add ARB prior to discharge, diuretics as ordered  per Dr. Roxy Manns, MT chest tubes to be removed today, continue current care  LOS: 9 days    Ellwood Handler, PA-C 06/14/2020   I have seen and examined the patient and agree with the assessment and plan as outlined.  Clinically making slow but satisfactory progress.  Volume overloaded w/ weight 4 kg > baseline.   Hyponatremia almost certainly due to volume and free water excess.   Will resume diuretics today.  Add low dose beta blocker and consider ACE-I versus Entresto.   D/C mediastinal tubes  and D/C pleural tubes once output decreases further.  Will wait to check f/u ECHO until after tubes are out and consider consulting advanced heart failure team to assist w/ medical Rx as needed.    Rexene Alberts, MD 06/14/2020 8:23 AM

## 2020-06-14 NOTE — Progress Notes (Signed)
Removed Mediastinal chest tubes, pleural tubes still remaining.  Pt tolerated well with no difficulties, VSS.  Bedrest for 30 minutes.

## 2020-06-14 NOTE — Progress Notes (Signed)
      [address]       [place] [address]             [phone]        Contacted via Mr. Weitman's nurse for changes in vision.  On assessment of patient he states he isn't sure when the symptoms exactly started.  However he notices when he is looking at his tablet or even the board on the well that he can see fine out of his left eye.  However with his right eye he can see, but there a colored dots over what he is trying to see.  He denies lack of vision, headache, unilateral weakness and difficult speech.  Neuro exam performed and was WNL.  Patient was able to see peripherally and in front of his eyes w/o difficulty.  A/P:  1. I suspect the patient is experiencing some floaters from being on bypass.  His blood pressure has been elevated at times which could contribute to this.  His neurologic exam was WNL and did not see any signs/symptoms that indicated stroke like symptoms were present.   2. Will monitor vision changes closely, Coreg was initiated to start this evening, will also start low dose Cozaar for additional BP control as he has been experiencing elevated BP most of the day.  Ellwood Handler, PA-C

## 2020-06-15 ENCOUNTER — Other Ambulatory Visit (HOSPITAL_COMMUNITY): Payer: Self-pay

## 2020-06-15 DIAGNOSIS — I5043 Acute on chronic combined systolic (congestive) and diastolic (congestive) heart failure: Secondary | ICD-10-CM | POA: Diagnosis not present

## 2020-06-15 DIAGNOSIS — Z9889 Other specified postprocedural states: Secondary | ICD-10-CM | POA: Diagnosis not present

## 2020-06-15 LAB — BASIC METABOLIC PANEL
Anion gap: 11 (ref 5–15)
BUN: 17 mg/dL (ref 8–23)
CO2: 24 mmol/L (ref 22–32)
Calcium: 8.3 mg/dL — ABNORMAL LOW (ref 8.9–10.3)
Chloride: 97 mmol/L — ABNORMAL LOW (ref 98–111)
Creatinine, Ser: 1 mg/dL (ref 0.61–1.24)
GFR, Estimated: >60 mL/min (ref >60)
Glucose, Bld: 93 mg/dL (ref 70–99)
Potassium: 3.8 mmol/L (ref 3.5–5.1)
Sodium: 132 mmol/L — ABNORMAL LOW (ref 135–145)

## 2020-06-15 LAB — CBC
HCT: 25.2 % — ABNORMAL LOW (ref 39.0–52.0)
Hemoglobin: 8.8 g/dL — ABNORMAL LOW (ref 13.0–17.0)
MCH: 32.7 pg (ref 26.0–34.0)
MCHC: 34.9 g/dL (ref 30.0–36.0)
MCV: 93.7 fL (ref 80.0–100.0)
Platelets: 206 10*3/uL (ref 150–400)
RBC: 2.69 MIL/uL — ABNORMAL LOW (ref 4.22–5.81)
RDW: 13.1 % (ref 11.5–15.5)
WBC: 10.3 10*3/uL (ref 4.0–10.5)
nRBC: 0.2 % (ref 0.0–0.2)

## 2020-06-15 LAB — MAGNESIUM: Magnesium: 2.2 mg/dL (ref 1.7–2.4)

## 2020-06-15 LAB — PROTIME-INR
INR: 1.2 (ref 0.8–1.2)
Prothrombin Time: 15.4 seconds — ABNORMAL HIGH (ref 11.4–15.2)

## 2020-06-15 LAB — BRAIN NATRIURETIC PEPTIDE: B Natriuretic Peptide: 492.8 pg/mL — ABNORMAL HIGH (ref 0.0–100.0)

## 2020-06-15 MED ORDER — MAGNESIUM SULFATE 2 GM/50ML IV SOLN
2.0000 g | Freq: Once | INTRAVENOUS | Status: AC
  Administered 2020-06-15: 2 g via INTRAVENOUS
  Filled 2020-06-15: qty 50

## 2020-06-15 MED ORDER — POTASSIUM CHLORIDE CRYS ER 20 MEQ PO TBCR
40.0000 meq | EXTENDED_RELEASE_TABLET | Freq: Two times a day (BID) | ORAL | Status: AC
  Administered 2020-06-15 – 2020-06-16 (×3): 40 meq via ORAL
  Filled 2020-06-15 (×3): qty 2

## 2020-06-15 NOTE — Progress Notes (Signed)
[id number] Helped staff get pt settled after he got self from recliner to bed. IV wrapped around leg. Encouraged pt to use call bell. Stated he wanted to get back to bed as tired. Put on bed alarm and helped staff pull pt up in bed. He was requesting home equipment. Discussed with pt we can get case manager to see. Pt would like rolling walker for home use and had other requests. Will follow up later to walk. Graylon Good RN BSN 06/15/2020 11:21 AM

## 2020-06-15 NOTE — Care Management Important Message (Signed)
Important Message  Patient Details  Name: Ian Mcgee MRN: [id number] Date of Birth: [birth year]   Medicare Important Message Given:  Yes     Ian Mcgee 06/15/2020, 9:38 AM

## 2020-06-15 NOTE — Progress Notes (Addendum)
[address]       [place] [address]             [phone]      5 Days Post-Op Procedure(s) (LRB): MITRAL VALVE REPAIR USING CARPENTIER MCCARTHY-ADAMS RING SIZE 28MM (N/A) CORONARY ARTERY BYPASS GRAFTING (CABG) X THREE USING LEFT INTERNAL MAMMARY ARTERY AND BILATERAL LEGS GREATER SAPHENOUS VEIN HARVESTED ENDOSCOPICALLY (N/A) MAZE (N/A) TRANSESOPHAGEAL ECHOCARDIOGRAM (TEE) (N/A) CLIPPING OF ATRIAL APPENDAGE USING  ATRICURE PRO2 CLIP SIZE 45MM   Subjective:  Patient states he is feeling better.  He did however have a weird episode of disorientation last night where he didn't know where he was.  States his vision is improved.  He is ambulating  + BM  Objective: Vital signs in last 24 hours: Temp:  [98.2 F (36.8 C)-99.2 F (37.3 C)] 98.3 F (36.8 C) (05/03 0355) Pulse Rate:  [69-96] 69 (05/03 0355) Cardiac Rhythm: Ventricular paced (05/03 0700) Resp:  [15-20] 20 (05/03 0355) BP: (110-150)/(62-84) 111/75 (05/03 0355) SpO2:  [93 %-100 %] 93 % (05/03 0355) Weight:  [69.9 kg] 69.9 kg (05/03 0355)  Intake/Output from previous day: 05/02 0701 - 05/03 0700 In: 250 [P.O.:250] Out: 4931 [Urine:4525; Chest Tube:406]  General appearance: alert, cooperative and no distress Heart: regular rate and rhythm and paced Lungs: diminished breath sounds left base Abdomen: soft, non-tender; bowel sounds normal; no masses,  no organomegaly Extremities: edema trace Wound: clean and dry  Lab Results: Recent Labs    06/14/20 0131 06/15/20 0147  WBC 10.5 10.3  HGB 8.6* 8.8*  HCT 25.2* 25.2*  PLT 178 206   BMET:  Recent Labs    06/14/20 0131 06/15/20 0147  NA 126* 132*  K 4.2 3.8  CL 95* 97*  CO2 21* 24  GLUCOSE 106* 93  BUN 24* 17  CREATININE 1.08 1.00  CALCIUM 8.3* 8.3*    PT/INR:  Recent Labs    06/15/20 0147  LABPROT 15.4*  INR 1.2   ABG    Component Value Date/Time   PHART 7.303 (L) 06/10/2020 1812   HCO3 23.6 06/10/2020 1812   TCO2 25  06/10/2020 1812   ACIDBASEDEF 3.0 (H) 06/10/2020 1812   O2SAT 57.5 06/13/2020 0415   CBG (last 3)  Recent Labs    06/13/20 1828 06/13/20 2038 06/14/20 1533  GLUCAP 129* 117* 140*    Assessment/Plan: S/P Procedure(s) (LRB): MITRAL VALVE REPAIR USING CARPENTIER MCCARTHY-ADAMS RING SIZE 28MM (N/A) CORONARY ARTERY BYPASS GRAFTING (CABG) X THREE USING LEFT INTERNAL MAMMARY ARTERY AND BILATERAL LEGS GREATER SAPHENOUS VEIN HARVESTED ENDOSCOPICALLY (N/A) MAZE (N/A) TRANSESOPHAGEAL ECHOCARDIOGRAM (TEE) (N/A) CLIPPING OF ATRIAL APPENDAGE USING  ATRICURE PRO2 CLIP SIZE 45MM  1. CV- paced rhythm, BP improved- on Coreg, Cozaar planning to transition to [facility]-- AHF has been consulted  2. INR 1.2, will repeat coumadin today at 5 mg daily 3. Pulm- off oxygen, CT output was 470 cc output yesterday (level currently at 170 cc in pleurovac), leave chest tubes in place today,  continue IS 4. Renal- creatinine WNL, weight is trending down, K is WNL at 3.8, Hyponatremia improving up to 132, continue diuretics as ordered Spironolactone, Lasix 5. GI- constipation, resolved 6. Dispo- patient stable, vision complaints improving, BP improved with addition of Coreg, Cozaar.. planning to transition to entresto per AHF, hyponatremia improved, leave chest tubes in place today, continue coumadin INR not yet trending up   LOS: 10 days    Ellwood Handler, PA-C 06/15/2020   I have seen  and examined the patient and agree with the assessment and plan as outlined.  Rexene Alberts, MD 06/15/2020 8:05 AM

## 2020-06-15 NOTE — Progress Notes (Signed)
CARDIAC REHAB PHASE I   PRE:  Rate/Rhythm: 75 paced  BP:  Supine:   Sitting: 92/50  Standing:    SaO2: 95%RA  MODE:  Ambulation: 650 ft   POST:  Rate/Rhythm: 79 paced  BP:  Supine:   Sitting: 103/60  Standing:    SaO2: 95-97%RA 1123-1200 Pt walked 650 ft on RA with rolling walker and asst x 1. Stopped once to rest. Some DOE but less than yesterday. Back to bed after walk with call bell and bed alarm on. Tolerated well. Sats good on RA.   Graylon Good, RN BSN  06/15/2020 11:56 AM

## 2020-06-15 NOTE — TOC Benefit Eligibility Note (Signed)
Transition of Care [facility] Benefit Eligibility Note    Patient Details  Name: NGAI PARCELL MRN: [id number] Date of Birth: [birth year]   Medication/Dose: Delene Loll 24mg .- 26mg . bid 30 day supply  Covered?: Yes  Tier: 3 Drug  Prescription Coverage Preferred Pharmacy: [facility] with Person/Company/Phone Number:: Per [place] PH# [phone]  Co-Pay: $42.00  Prior Approval: No  Deductible:  (?)       Shelda Altes Phone Number: 06/15/2020, 1:20 PM

## 2020-06-15 NOTE — Progress Notes (Addendum)
Advanced Heart Failure Team Rounding Note   Primary Physician:Khan, Jaber Primary Cardiologist: Dr. Jenne Campus Primary CT surgeon:  Dr. Darylene Price  Reason for Consultation: systolic CHF med titration   HPI:    He is POD#5.  Good diuresis yesterday weight down 11lbs.  Now 2lbs lower than admission weight of 156. Cr remains improved at 1.0, sodium improved to 132, normotensive.  Continues to feel better, did have some brief disorientation overnight which has resolved.  Has minimal chest discomfort, laying relatively flat when sleeping.  No PND.  Minimal dizziness upon standing tolerating GDMT well so far.    Objective:    Vital Signs:   Temp:  [98.2 F (36.8 C)-99.2 F (37.3 C)] 98.3 F (36.8 C) (05/03 0355) Pulse Rate:  [69-96] 69 (05/03 0355) Resp:  [15-20] 20 (05/03 0355) BP: (110-150)/(62-84) 111/75 (05/03 0355) SpO2:  [93 %-100 %] 93 % (05/03 0355) Weight:  [69.9 kg] 69.9 kg (05/03 0355) Last BM Date: 06/14/20  Weight change: Filed Weights   06/13/20 0400 06/14/20 0118 06/15/20 0355  Weight: 74.6 kg 74.9 kg 69.9 kg    Intake/Output:   Intake/Output Summary (Last 24 hours) at 06/15/2020 0733 Last data filed at 06/15/2020 0548 Gross per 24 hour  Intake 250 ml  Output 4931 ml  Net -4681 ml      Physical Exam    General:  Well appearing. No resp difficulty HEENT: normal Neck: supple. JVP 10-11  . Carotids 2+ bilat; no bruits. No lymphadenopathy or thyromegaly appreciated. Cor: chest incision site clean, dry, intact, PMI nondisplaced. Regular rate & rhythm. No rubs, 2/6 systolic murmur Lungs: clear Abdomen: soft, nontender, nondistended. No hepatosplenomegaly. No bruits or masses. Good bowel sounds. Extremities: no cyanosis, clubbing, rash, improved LE edema R>L Neuro: alert & orientedx3, cranial nerves grossly intact. moves all 4 extremities w/o difficulty. Affect pleasant   Telemetry   Atrial sensed, BIV paced rate high 80's  EKG    No new  ecg  Labs   Basic Metabolic Panel: Recent Labs  Lab 06/10/20 2136 06/11/20 0500 06/11/20 1624 06/11/20 2323 06/12/20 0409 06/13/20 0422 06/14/20 0131 06/15/20 0147  NA 136 134* 131* 129* 129* 127* 126* 132*  K 3.6 5.0 4.8 4.5 4.5 4.1 4.2 3.8  CL 108 105 102 102 98 97* 95* 97*  CO2 23 23 19* 20* 21* 23 21* 24  GLUCOSE 151* 123* 143* 127* 119* 107* 106* 93  BUN 14 16 23  29* 31* 32* 24* 17  CREATININE 0.81 1.00 1.88* 2.24* 2.31* 1.35* 1.08 1.00  CALCIUM 7.2* 7.8* 7.9* 7.5* 8.0* 7.9* 8.3* 8.3*  MG 2.7* 2.5* 2.5*  --   --   --   --  2.2    Liver Function Tests: Recent Labs  Lab 06/09/20 0034  AST 23  ALT 20  ALKPHOS 40  BILITOT 0.6  PROT 5.9*  ALBUMIN 3.6   No results for input(s): LIPASE, AMYLASE in the last 168 hours. No results for input(s): AMMONIA in the last 168 hours.  CBC: Recent Labs  Lab 06/12/20 0409 06/12/20 1120 06/13/20 0422 06/14/20 0131 06/15/20 0147  WBC 5.9 7.4 7.5 10.5 10.3  HGB 8.5* 8.5* 8.0* 8.6* 8.8*  HCT 25.2* 25.3* 23.6* 25.2* 25.2*  MCV 96.6 96.2 95.9 95.5 93.7  PLT 132* 141* 128* 178 206    Cardiac Enzymes: No results for input(s): CKTOTAL, CKMB, CKMBINDEX, TROPONINI in the last 168 hours.  BNP: BNP (last 3 results) Recent Labs    06/05/20  1936 06/15/20 0147  BNP 320.0* 492.8*    ProBNP (last 3 results) Recent Labs    05/27/20 1535  PROBNP 1,028*     CBG: Recent Labs  Lab 06/13/20 0645 06/13/20 1118 06/13/20 1828 06/13/20 2038 06/14/20 1533  GLUCAP 93 103* 129* 117* 140*    Coagulation Studies: Recent Labs    06/13/20 0422 06/14/20 0131 06/15/20 0147  LABPROT 14.9 14.9 15.4*  INR 1.2 1.2 1.2     Imaging   No results found.   Medications:     Current Medications: . acetaminophen  1,000 mg Oral Q6H  . aspirin EC  81 mg Oral Daily  . atorvastatin  40 mg Oral Daily  . bisacodyl  10 mg Oral Daily   Or  . bisacodyl  10 mg Rectal Daily  . carvedilol  3.125 mg Oral BID WC  . chlorhexidine  15  mL Mouth Rinse BID  . docusate sodium  200 mg Oral Daily  . enoxaparin (LOVENOX) injection  30 mg Subcutaneous QHS  . ferrous [id number] C-folic acid  1 capsule Oral Q breakfast  . furosemide  40 mg Intravenous BID  . losartan  25 mg Oral Daily  . mouth rinse  15 mL Mouth Rinse q12n4p  . pantoprazole  40 mg Oral Daily  . polyethylene glycol  17 g Oral Daily  . potassium chloride  20 mEq Oral BID WC  . sacubitril-valsartan  1 tablet Oral BID  . sodium chloride flush  3 mL Intravenous Q12H  . spironolactone  25 mg Oral Daily  . warfarin  5 mg Oral q1600  . Warfarin - Physician Dosing Inpatient   Does not apply q1600    Infusions: . sodium chloride Stopped (06/13/20 1549)       Assessment/Plan   Acute on Chronic combined systolic and diastolic CHF: -Patient has history of nonischemic cardiomyopathy with LVEF 35-40% 2019. S/p BiV PPM placement with improvement in EF . Now likely an element of ischemic cardiomyopathy given progression of CAD -ECHO 05/2020 with EF 40%, moderate-severe MR, normal RV function, G2DD. -LHC 05/2020 with multivessel disease now s/p CABG x3 and MVR with maze procedure -Currently NYHA class III symptoms, still with some volume overload -Continue lasix 40mg  IV BID, good urine output getting closer to euvolemia probably one more day of IV.   -Continue coreg 3.125mg  BID, spironolactone 25mg  daily -Now on losartan bridging to low dose entresto -Will try to add SGLT-2i prior to discharge -Monitor renal fx with diuresis  Severe MR s/p MV Repair: -Intraop TEE with moderate MR.   -No residual MR on post repair TEE -On warfarin which is being titrated  Multivessel CAD s/p CABG: -POD 5 from 3v CABG with Dr. Roxy Manns. -Continue ASA 81mg , warfarin and atorvastatin 40mg  daily -Continue coreg 3.125mg  BID, spironolactone 25mg  daily -Agree with bridging to low dose entresto with losartan -Will try to add SGLT-2i prior to discharge  AKI: -Cr peaked at 2.31,  Likely ATN. -Currently Cr 1.0 -continue diuresis as above -follow renal fx with diuresis  Hyponatremia: -Likely hypervolemic hypernatremia in the setting of volume overload. Possible component of pain associated hyponatremia - improving with diuresis  Paroxysmal Afib/flutter: -S/p MAZE procedure with LAA clipping. -Continue coreg -Warfarin per CV surgery  History of CHB: -S/p BiV PPM placement, functioning appropriately   Length of Stay: [place], MD  06/15/2020, 7:33 AM  Advanced Heart Failure Team Pager [phone] (M-F; 7a - 4p)  Please contact [facility] for night-coverage after  hours (4p -7a ) and weekends on [email or web address]   Patient seen and examined with the above-signed Advanced Practice Provider and/or Housestaff. I personally reviewed laboratory data, imaging studies and relevant notes. I independently examined the patient and formulated the important aspects of the plan. I have edited the note to reflect any of my changes or salient points. I have personally discussed the plan with the patient and/or family.  Excellent diuresis overnight. Still feels fluid overloaded. Had episode of disorientation overnight but resolved. Denies orthopnea or PND  General: Sitting up in chair No resp difficulty HEENT: normal Neck: supple. JVP to jaw Carotids 2+ bilat; no bruits. No lymphadenopathy or thryomegaly appreciated. Cor: PMI nondisplaced.  Sternal wound ok. Regular rate & rhythm. No rubs, gallops or murmurs. +CT Lungs: clear Abdomen: soft, nontender, nondistended. No hepatosplenomegaly. No bruits or masses. Good bowel sounds. Extremities: no cyanosis, clubbing, rash, 2+ edema Neuro: alert & orientedx3, cranial nerves grossly intact. moves all 4 extremities w/o difficulty. Affect pleasant  Diuresing well but still fluid overloaded. Continue IV lasix. Place TED hose. Ambulate. Agree with adjustment of HF meds as above. Continue warfarin load per TCTS. Rhythm stable.    Glori Bickers, MD  9:49 AM

## 2020-06-15 NOTE — Progress Notes (Signed)
Mobility Specialist: Progress Note   06/15/20 1346  Mobility  Activity Ambulated in hall  Level of Assistance Independent after set-up  Assistive Device Front wheel walker  Distance Ambulated (ft) 820 ft  Mobility Response Tolerated well  Mobility performed by Mobility specialist  Bed Position Chair  $Mobility charge 1 Mobility   Post-Mobility: 83 HR  Pt DOE some. Pt stopped for one standing break due to feeling SOB but unable to get SpO2 reading at this time and after returning to room. Pt otherwise asx and to chair after walk with call bell at his side.   [facility] Ian Mcgee Mobility Specialist Mobility Specialist Phone: [phone]

## 2020-06-16 ENCOUNTER — Inpatient Hospital Stay (HOSPITAL_COMMUNITY): Payer: Medicare Other

## 2020-06-16 DIAGNOSIS — Z9889 Other specified postprocedural states: Secondary | ICD-10-CM | POA: Diagnosis not present

## 2020-06-16 DIAGNOSIS — I5043 Acute on chronic combined systolic (congestive) and diastolic (congestive) heart failure: Secondary | ICD-10-CM | POA: Diagnosis not present

## 2020-06-16 DIAGNOSIS — Z954 Presence of other heart-valve replacement: Secondary | ICD-10-CM | POA: Diagnosis not present

## 2020-06-16 DIAGNOSIS — I420 Dilated cardiomyopathy: Secondary | ICD-10-CM

## 2020-06-16 LAB — BASIC METABOLIC PANEL
Anion gap: 6 (ref 5–15)
BUN: 17 mg/dL (ref 8–23)
CO2: 27 mmol/L (ref 22–32)
Calcium: 8.3 mg/dL — ABNORMAL LOW (ref 8.9–10.3)
Chloride: 100 mmol/L (ref 98–111)
Creatinine, Ser: 0.92 mg/dL (ref 0.61–1.24)
GFR, Estimated: >60 mL/min (ref >60)
Glucose, Bld: 105 mg/dL — ABNORMAL HIGH (ref 70–99)
Potassium: 4.2 mmol/L (ref 3.5–5.1)
Sodium: 133 mmol/L — ABNORMAL LOW (ref 135–145)

## 2020-06-16 LAB — CBC
HCT: 27 % — ABNORMAL LOW (ref 39.0–52.0)
Hemoglobin: 9.1 g/dL — ABNORMAL LOW (ref 13.0–17.0)
MCH: 32 pg (ref 26.0–34.0)
MCHC: 33.7 g/dL (ref 30.0–36.0)
MCV: 95.1 fL (ref 80.0–100.0)
Platelets: 234 10*3/uL (ref 150–400)
RBC: 2.84 MIL/uL — ABNORMAL LOW (ref 4.22–5.81)
RDW: 13.1 % (ref 11.5–15.5)
WBC: 11.4 10*3/uL — ABNORMAL HIGH (ref 4.0–10.5)
nRBC: 0 % (ref 0.0–0.2)

## 2020-06-16 LAB — ECHOCARDIOGRAM COMPLETE
AR max vel: 2.69 cm2
AV Area VTI: 2.4 cm2
AV Area mean vel: 2.57 cm2
AV Mean grad: 9 mmHg
AV Peak grad: 16 mmHg
Ao pk vel: 2 m/s
Area-P 1/2: 2.61 cm2
Calc EF: 29 %
Height: 71 in
MV VTI: 2.02 cm2
S' Lateral: 5.1 cm
Single Plane A2C EF: 27.5 %
Single Plane A4C EF: 31.4 %
Weight: 2423.3 oz

## 2020-06-16 LAB — PROTIME-INR
INR: 1.4 — ABNORMAL HIGH (ref 0.8–1.2)
Prothrombin Time: 16.7 seconds — ABNORMAL HIGH (ref 11.4–15.2)

## 2020-06-16 MED ORDER — SODIUM CHLORIDE 0.9 % IV BOLUS
250.0000 mL | Freq: Once | INTRAVENOUS | Status: AC
  Administered 2020-06-16: 250 mL via INTRAVENOUS

## 2020-06-16 NOTE — Progress Notes (Addendum)
[address]       [place] [address]             [phone]      6 Days Post-Op Procedure(s) (LRB): MITRAL VALVE REPAIR USING CARPENTIER MCCARTHY-ADAMS RING SIZE 28MM (N/A) CORONARY ARTERY BYPASS GRAFTING (CABG) X THREE USING LEFT INTERNAL MAMMARY ARTERY AND BILATERAL LEGS GREATER SAPHENOUS VEIN HARVESTED ENDOSCOPICALLY (N/A) MAZE (N/A) TRANSESOPHAGEAL ECHOCARDIOGRAM (TEE) (N/A) CLIPPING OF ATRIAL APPENDAGE USING  ATRICURE PRO2 CLIP SIZE 45MM Subjective: Didn't rest well last night but says he is more comfortable this morning.  Took a couple of extended walks yesterday, says he feels good about his progress.    Objective: Vital signs in last 24 hours: Temp:  [98 F (36.7 C)-98.4 F (36.9 C)] 98 F (36.7 C) (05/04 0427) Pulse Rate:  [74-97] 74 (05/04 0427) Cardiac Rhythm: Ventricular paced;Other (Comment) (05/03 1900) Resp:  [17-24] 18 (05/04 0427) BP: (102-116)/(59-70) 102/61 (05/04 0427) SpO2:  [93 %-98 %] 93 % (05/04 0427) Weight:  [68.7 kg] 68.7 kg (05/04 0427) :    Intake/Output from previous day: 05/03 0701 - 05/04 0700 In: 25 [P.O.:820] Out: 2490 [Urine:2300; Chest Tube:190] Intake/Output this shift: No intake/output data recorded.  General appearance: alert, cooperative and mild distress Neurologic: intact Heart: paced rhythm (with bi-ventricular PPM) Lungs: breath sounds are clear Extremities: bilateral EVH incisions are intact and dry.  Wound: The sternal iincision is well approximated and dry.   Lab Results: Recent Labs    06/15/20 0147 06/16/20 0634  WBC 10.3 11.4*  HGB 8.8* 9.1*  HCT 25.2* 27.0*  PLT 206 234   BMET:  Recent Labs    06/15/20 0147 06/16/20 0634  NA 132* 133*  K 3.8 4.2  CL 97* 100  CO2 24 27  GLUCOSE 93 105*  BUN 17 17  CREATININE 1.00 0.92  CALCIUM 8.3* 8.3*    PT/INR:  Recent Labs    06/16/20 0151  LABPROT 16.7*  INR 1.4*   ABG    Component Value Date/Time   PHART 7.303 (L)  06/10/2020 1812   HCO3 23.6 06/10/2020 1812   TCO2 25 06/10/2020 1812   ACIDBASEDEF 3.0 (H) 06/10/2020 1812   O2SAT 57.5 06/13/2020 0415   CBG (last 3)  Recent Labs    06/13/20 1828 06/13/20 2038 06/14/20 1533  GLUCAP 129* 117* 140*    Assessment/Plan: S/P Procedure(s) (LRB): MITRAL VALVE REPAIR USING CARPENTIER MCCARTHY-ADAMS RING SIZE 28MM (N/A) CORONARY ARTERY BYPASS GRAFTING (CABG) X THREE USING LEFT INTERNAL MAMMARY ARTERY AND BILATERAL LEGS GREATER SAPHENOUS VEIN HARVESTED ENDOSCOPICALLY (N/A) MAZE (N/A) TRANSESOPHAGEAL ECHOCARDIOGRAM (TEE) (N/A) CLIPPING OF ATRIAL APPENDAGE USING  ATRICURE PRO2 CLIP SIZE 45MM  -POD-6 CABG x 3, MV Repair, MAZE, and left atrial clip for CAD, severe MR, and non-ischemic cardiomyopathy.  Progressing well with mobility, CT drainage has tapered off--will remove both CT's today, repeat CXR in am.  INR 1.4, continue Coumadin at 5mg /day. Advanced heart failure following and managing diuresis and HF medications.   -Post-op AKI- renal function has recovered.   -Expected acute blood loss anemia-Hct trending up, continue Trinsicon.   -DVT PPX- continue enoxaparin.   -Disposition- anticipate he will be ready for discharge to home tomorrow.    LOS: 11 days    Antony Odea, PA-C [phone] 06/16/2020   I have seen and examined the patient and agree with the assessment and plan as outlined.  Tentatively plan d/c home tomorrow if okay w/ cardiology team.  Check  post op ECHO today.  Rexene Alberts, MD 06/16/2020 8:34 AM

## 2020-06-16 NOTE — Progress Notes (Signed)
Pleural Chest tubes removed without difficulty.  VSS, no SOB.  Bedrest for 30 minutes.

## 2020-06-16 NOTE — Progress Notes (Signed)
Mobility Specialist - Progress Note   06/16/20 1252  Mobility  Activity Contraindicated/medical hold   BP is currently 85/61 at rest, instructed not to see pt by RN. Will f/u as able.   Pricilla Handler Mobility Specialist Mobility Specialist Phone: [phone]

## 2020-06-16 NOTE — Progress Notes (Signed)
CARDIAC REHAB PHASE I   PRE:  Rate/Rhythm: paced 82  BP:  Supine: 111/64  Sitting:   Standing:    SaO2: 95%RA  MODE:  Ambulation: 800 ft   POST:  Rate/Rhythm: 108 ST  BP:  Supine: 76/60, 105/62  Sitting:   Standing:    SaO2: 96%RA 1027-1103 Pt wanted to try walking without walker but only made short distance in hallway and requested it back due to being wobbly. Pt walked 800 ft on RA with rolling walker and stated his breathing better but he felt a little dizzy. BP upon return to room and lying down 76/60. Retook BP and it was 105/62. Notified pt's RN of low BP and asked Mobility specialist to do orthostatics later when he walks with pt. Sent case manager note that pt would like to see her re walker and possible seat for bathing.  In bed resting with bed alarm on since low BP when up.   Graylon Good, RN BSN  06/16/2020 10:58 AM

## 2020-06-16 NOTE — Progress Notes (Addendum)
Advanced Heart Failure Team Rounding Note   Primary Physician:Khan, Jaber Primary Cardiologist: Dr. Jenne Campus Primary CT surgeon:  Dr. Darylene Price  Reason for Consultation: systolic CHF med titration   HPI:    He is POD#6.  Diuresis slowed down a bit yesterday but weight down 3 additional pounds.  Now 5lbs lower than admission weight of 156. Cr remains improved at 0.92, sodium improved to 133.  BP a little lower now 102/61 this am.  Received first dose of entresto last night.  Walked around the unit yesterday still some DOE however denies any dizziness, light headedness overall tolerating meds very well.  Plan to remove chest tubes today.    Objective:    Vital Signs:   Temp:  [98 F (36.7 C)-98.4 F (36.9 C)] 98 F (36.7 C) (05/04 0427) Pulse Rate:  [74-97] 74 (05/04 0427) Resp:  [17-24] 18 (05/04 0427) BP: (102-116)/(59-70) 102/61 (05/04 0427) SpO2:  [93 %-98 %] 93 % (05/04 0427) Weight:  [68.7 kg] 68.7 kg (05/04 0427) Last BM Date: 06/15/20  Weight change: Filed Weights   06/14/20 0118 06/15/20 0355 06/16/20 0427  Weight: 74.9 kg 69.9 kg 68.7 kg    Intake/Output:   Intake/Output Summary (Last 24 hours) at 06/16/2020 0739 Last data filed at 06/16/2020 0600 Gross per 24 hour  Intake 820 ml  Output 2490 ml  Net -1670 ml      Physical Exam    General:  Well appearing. No resp difficulty HEENT: normal Neck: supple. JVP 7-8  . Carotids 2+ bilat; no bruits. No lymphadenopathy or thyromegaly appreciated. Cor: chest incision site clean, dry, intact, PMI nondisplaced. Regular rate & rhythm. No rubs, 2/6 systolic murmur Lungs: clear Abdomen: soft, nontender, nondistended. No hepatosplenomegaly. No bruits or masses. Good bowel sounds. Extremities: no cyanosis, clubbing, rash, trace LE edema much improved Neuro: alert & orientedx3, cranial nerves grossly intact. moves all 4 extremities w/o difficulty. Affect pleasant   Telemetry   Atrial sensed, BIV paced  rate high 80's  EKG    No new ecg  Labs   Basic Metabolic Panel: Recent Labs  Lab 06/10/20 2136 06/11/20 0500 06/11/20 1624 06/11/20 2323 06/12/20 0409 06/13/20 0422 06/14/20 0131 06/15/20 0147 06/16/20 0634  NA 136 134* 131*   < > 129* 127* 126* 132* 133*  K 3.6 5.0 4.8   < > 4.5 4.1 4.2 3.8 4.2  CL 108 105 102   < > 98 97* 95* 97* 100  CO2 23 23 19*   < > 21* 23 21* 24 27  GLUCOSE 151* 123* 143*   < > 119* 107* 106* 93 105*  BUN 14 16 23    < > 31* 32* 24* 17 17  CREATININE 0.81 1.00 1.88*   < > 2.31* 1.35* 1.08 1.00 0.92  CALCIUM 7.2* 7.8* 7.9*   < > 8.0* 7.9* 8.3* 8.3* 8.3*  MG 2.7* 2.5* 2.5*  --   --   --   --  2.2  --    < > = values in this interval not displayed.    Liver Function Tests: No results for input(s): AST, ALT, ALKPHOS, BILITOT, PROT, ALBUMIN in the last 168 hours. No results for input(s): LIPASE, AMYLASE in the last 168 hours. No results for input(s): AMMONIA in the last 168 hours.  CBC: Recent Labs  Lab 06/12/20 1120 06/13/20 0422 06/14/20 0131 06/15/20 0147 06/16/20 0634  WBC 7.4 7.5 10.5 10.3 11.4*  HGB 8.5* 8.0* 8.6* 8.8* 9.1*  HCT 25.3* 23.6* 25.2* 25.2* 27.0*  MCV 96.2 95.9 95.5 93.7 95.1  PLT 141* 128* 178 206 234    Cardiac Enzymes: No results for input(s): CKTOTAL, CKMB, CKMBINDEX, TROPONINI in the last 168 hours.  BNP: BNP (last 3 results) Recent Labs    06/05/20 1936 06/15/20 0147  BNP 320.0* 492.8*    ProBNP (last 3 results) Recent Labs    05/27/20 1535  PROBNP 1,028*     CBG: Recent Labs  Lab 06/13/20 0645 06/13/20 1118 06/13/20 1828 06/13/20 2038 06/14/20 1533  GLUCAP 93 103* 129* 117* 140*    Coagulation Studies: Recent Labs    06/14/20 0131 06/15/20 0147 06/16/20 0151  LABPROT 14.9 15.4* 16.7*  INR 1.2 1.2 1.4*     Imaging   No results found.   Medications:     Current Medications: . aspirin EC  81 mg Oral Daily  . atorvastatin  40 mg Oral Daily  . bisacodyl  10 mg Oral Daily    Or  . bisacodyl  10 mg Rectal Daily  . carvedilol  3.125 mg Oral BID WC  . chlorhexidine  15 mL Mouth Rinse BID  . docusate sodium  200 mg Oral Daily  . enoxaparin (LOVENOX) injection  30 mg Subcutaneous QHS  . ferrous [id number] C-folic acid  1 capsule Oral Q breakfast  . furosemide  40 mg Intravenous BID  . mouth rinse  15 mL Mouth Rinse q12n4p  . pantoprazole  40 mg Oral Daily  . polyethylene glycol  17 g Oral Daily  . potassium chloride  40 mEq Oral BID WC  . sacubitril-valsartan  1 tablet Oral BID  . sodium chloride flush  3 mL Intravenous Q12H  . spironolactone  25 mg Oral Daily  . warfarin  5 mg Oral q1600  . Warfarin - Physician Dosing Inpatient   Does not apply q1600    Infusions: . sodium chloride Stopped (06/13/20 1549)       Assessment/Plan   Acute on Chronic combined systolic and diastolic CHF: -Patient has history of nonischemic cardiomyopathy with LVEF 35-40% 2019. S/p BiV PPM placement with improvement in EF . Now likely an element of ischemic cardiomyopathy given progression of CAD -ECHO 05/2020 with EF 40%, moderate-severe MR, normal RV function, G2DD. -LHC 05/2020 with multivessel disease now s/p CABG x3 and MVR with maze procedure -Currently NYHA class III symptoms, still with some volume overload -Continue coreg 3.125mg  BID, spironolactone 25mg  daily -Now on Entresto 24/26 started last night -hold diuretics today, possibly start SGLT2i tomorrow -Monitor renal fx with diuresis  Severe MR s/p MV Repair: -Intraop TEE with moderate MR.   -No residual MR on post repair TEE -On warfarin which is being titrated  Multivessel CAD s/p CABG: -POD 6 from 3v CABG with Dr. Roxy Manns. -Continue ASA 81mg , warfarin and atorvastatin 40mg  daily -Continue coreg 3.125mg  BID, spironolactone 25mg  daily, entresto 24/26 -Will try to add SGLT-2i tomorrow  AKI: -Cr peaked at 2.31, Likely ATN. -Currently Cr 0.9 -continue diuresis as above -follow renal fx with  diuresis  Hyponatremia: -Likely hypervolemic hypernatremia in the setting of volume overload. Possible component of pain associated hyponatremia - improving with diuresis  Paroxysmal Afib/flutter: -S/p MAZE procedure with LAA clipping. -Continue coreg -Warfarin per CV surgery  History of CHB: -S/p BiV PPM placement, functioning appropriately   Length of Stay: 11  Katherine Roan, MD  06/16/2020, 7:39 AM  Advanced Heart Failure Team Pager [phone] (M-F; 7a - 4p)  Please contact [facility]  for night-coverage after hours (4p -7a ) and weekends on [email or web address]   Patient seen and examined with the above-signed Advanced Practice Provider and/or Housestaff. I personally reviewed laboratory data, imaging studies and relevant notes. I independently examined the patient and formulated the important aspects of the plan. I have edited the note to reflect any of my changes or salient points. I have personally discussed the plan with the patient and/or family.  Looks and feels better today.  Volume status improved. CT out this am.   General:  Well appearing. No resp difficulty HEENT: normal Neck: supple. no JVD. Carotids 2+ bilat; no bruits. No lymphadenopathy or thryomegaly appreciated. Cor: PMI nondisplaced. Regular rate & rhythm. No rubs, gallops or murmurs. Lungs: clear decreased at bases Abdomen: soft, nontender, nondistended. No hepatosplenomegaly. No bruits or masses. Good bowel sounds. Extremities: no cyanosis, clubbing, rash, edema Neuro: alert & orientedx3, cranial nerves grossly intact. moves all 4 extremities w/o difficulty. Affect pleasant  Doing well on current regimen. Would stop lasix. Continue spiro, Entresto and carvedilol. Possibly add SGLT2i in am. Agree with home in am if otherwise stable. Repeat echo. Continue warfarin/ASA per TCTS.   Glori Bickers, MD  9:18 AM

## 2020-06-16 NOTE — TOC Initial Note (Signed)
Transition of Care (TOC) - Initial/Assessment Note  Marvetta Gibbons RN, BSN Transitions of Care Unit 4E- RN Case Manager See Treatment Team for direct phone #    Patient Details  Name: Ian Mcgee MRN: [id number] Date of Birth: 1945-05-12  Transition of Care [facility]) CM/SW Contact:    Dawayne Patricia, RN Phone Number: 06/16/2020, 4:35 PM  Clinical Narrative:                 Referral for transition of care needs, Pt from home s/p MVR/CABG/MAZE.  CM in to speak with pt at bedside. Pt sitting on side of bed eating lunch. Discussed d/c needs- order has been placed for DME-RW. Pt voiced that he would also like a shower chair, explained insurance would not cover and offered that we could get one here along with RW from in house provider and gave cost- pt agreeable to this and has decided to go ahead and get both RW and shower chair from [employer] prior to discharge.  No further needs noted, and patient voiced that he had no further questions or needs.   Call made to [employer] for DME needs- RW and shower chair- DME to be delivered to room prior to discharge- anticipate d/c tomorrow 5/5 Expected Discharge Plan: Home/Self Care Barriers to Discharge: No Barriers Identified   Patient Goals and CMS Choice Patient states their goals for this hospitalization and ongoing recovery are:: return home CMS Medicare.gov Compare Post Acute Care list provided to:: Patient Choice offered to / list presented to : Patient  Expected Discharge Plan and Services Expected Discharge Plan: Home/Self Care   Discharge Planning Services: CM Consult Post Acute Care Choice: Durable Medical Equipment Living arrangements for the past 2 months: Single Family Home                 DME Arranged: Shower stool,Walker rolling DME Agency: [employer] Date DME Agency Contacted: 06/16/20 Time DME Agency Contacted: [phone] Representative spoke with at DME Agency: [place]: NA [place] Agency: NA        Prior Living  Arrangements/Services Living arrangements for the past 2 months: [place] Lives with:: Spouse Patient language and need for interpreter reviewed:: Yes Do you feel safe going back to the place where you live?: Yes      Need for Family Participation in Patient Care: Yes (Comment) Care giver support system in place?: Yes (comment)   Criminal Activity/Legal Involvement Pertinent to Current Situation/Hospitalization: No - Comment as needed  Activities of Daily Living Home Assistive Devices/Equipment: Grab bars in shower,Hand-held shower hose,Built-in shower seat,Shower chair with back,Walker (specify type),Wheelchair,Cane (specify quad or straight) ADL Screening (condition at time of admission) Patient's cognitive ability adequate to safely complete daily activities?: Yes Is the patient deaf or have difficulty hearing?: No Does the patient have difficulty seeing, even when wearing glasses/contacts?: No Does the patient have difficulty concentrating, remembering, or making decisions?: No Patient able to express need for assistance with ADLs?: Yes Does the patient have difficulty dressing or bathing?: No Independently performs ADLs?: Yes (appropriate for developmental age) Does the patient have difficulty walking or climbing stairs?: No Weakness of Legs: None Weakness of Arms/Hands: None  Permission Sought/Granted Permission sought to share information with : Facility Art therapist granted to share information with : Yes, Verbal Permission Granted     Permission granted to share info w AGENCY: [employer]        Emotional Assessment Appearance:: Appears stated age Attitude/Demeanor/Rapport: Engaged Affect (typically observed):  Appropriate Orientation: : Oriented to Self,Oriented to Place,Oriented to  Time,Oriented to Situation Alcohol / Substance Use: Not Applicable Psych Involvement: No (comment)  Admission diagnosis:  Angina of effort [facility]) [I20.8] Chest  pain [R07.9] Patient Active Problem List   Diagnosis Date Noted  . S/P CABG x 3 06/10/2020  . S/P mitral valve repair + CABG x3 + maze procedure 06/10/2020  . S/P Maze operation for atrial fibrillation 06/10/2020  . Encounter for preoperative dental examination   . Chronic apical periodontitis   . Gingivitis   . Teeth missing   . Gingival recession, generalized   . Defective dental restoration   . NSTEMI (non-ST elevated myocardial infarction) ([place]) 06/06/2020  . Essential hypertension   . Chest pain 06/05/2020  . Mitral regurgitation   . Angina of effort ([place]) 05/28/2020  . Acute on chronic systolic heart failure ([place])   . Pneumonia   . Syncope and collapse   . Hyperlipidemia   . Attention deficit disorder (ADD) 05/05/2019  . Status post biventricular pacemaker 07/16/2017  . Coronary artery disease 05/15/2017  . Cardiac pacemaker 05/10/2017  . Cardiomyopathy ([place]) 05/08/2017  . Dyslipidemia 05/08/2017  . Peripheral vascular disease ([place]) 05/08/2017  . Complete heart block ([place]) 06/10/2015  . Pacemaker reprogramming/check 06/10/2015   PCP:  Mateo Flow, MD Pharmacy:   [facility] [place], [address] [place] [address] Phone: [phone] Fax: [phone]     Social Determinants of Health (SDOH) Interventions    Readmission Risk Interventions No flowsheet data found.

## 2020-06-16 NOTE — Progress Notes (Signed)
  Echocardiogram 2D Echocardiogram has been performed.  Ian Mcgee 06/16/2020, 11:32 AM

## 2020-06-17 ENCOUNTER — Inpatient Hospital Stay (HOSPITAL_COMMUNITY): Payer: Medicare Other

## 2020-06-17 DIAGNOSIS — I4892 Unspecified atrial flutter: Secondary | ICD-10-CM

## 2020-06-17 DIAGNOSIS — I4891 Unspecified atrial fibrillation: Secondary | ICD-10-CM

## 2020-06-17 DIAGNOSIS — Z7901 Long term (current) use of anticoagulants: Secondary | ICD-10-CM

## 2020-06-17 DIAGNOSIS — I2510 Atherosclerotic heart disease of native coronary artery without angina pectoris: Secondary | ICD-10-CM

## 2020-06-17 LAB — BASIC METABOLIC PANEL
Anion gap: 6 (ref 5–15)
BUN: 23 mg/dL (ref 8–23)
CO2: 26 mmol/L (ref 22–32)
Calcium: 8.4 mg/dL — ABNORMAL LOW (ref 8.9–10.3)
Chloride: 99 mmol/L (ref 98–111)
Creatinine, Ser: 1.02 mg/dL (ref 0.61–1.24)
GFR, Estimated: >60 mL/min (ref >60)
Glucose, Bld: 112 mg/dL — ABNORMAL HIGH (ref 70–99)
Potassium: 4.5 mmol/L (ref 3.5–5.1)
Sodium: 131 mmol/L — ABNORMAL LOW (ref 135–145)

## 2020-06-17 LAB — CBC
HCT: 26.7 % — ABNORMAL LOW (ref 39.0–52.0)
Hemoglobin: 8.9 g/dL — ABNORMAL LOW (ref 13.0–17.0)
MCH: 31.8 pg (ref 26.0–34.0)
MCHC: 33.3 g/dL (ref 30.0–36.0)
MCV: 95.4 fL (ref 80.0–100.0)
Platelets: 255 10*3/uL (ref 150–400)
RBC: 2.8 MIL/uL — ABNORMAL LOW (ref 4.22–5.81)
RDW: 13 % (ref 11.5–15.5)
WBC: 13.9 10*3/uL — ABNORMAL HIGH (ref 4.0–10.5)
nRBC: 0 % (ref 0.0–0.2)

## 2020-06-17 LAB — PROTIME-INR
INR: 1.5 — ABNORMAL HIGH (ref 0.8–1.2)
Prothrombin Time: 18.4 seconds — ABNORMAL HIGH (ref 11.4–15.2)

## 2020-06-17 MED ORDER — ATORVASTATIN CALCIUM 40 MG PO TABS: 40.0000 mg | ORAL_TABLET | Freq: Every day | ORAL | 3 refills | Status: DC

## 2020-06-17 MED ORDER — SPIRONOLACTONE 25 MG PO TABS: 25.0000 mg | ORAL_TABLET | Freq: Every day | ORAL | 3 refills | Status: DC

## 2020-06-17 MED ORDER — WARFARIN SODIUM 5 MG PO TABS: 5.0000 mg | ORAL_TABLET | Freq: Every day | ORAL | 3 refills | Status: DC

## 2020-06-17 MED ORDER — LOSARTAN POTASSIUM 25 MG PO TABS: 25.0000 mg | ORAL_TABLET | Freq: Every day | ORAL | 3 refills | Status: DC

## 2020-06-17 MED ORDER — WARFARIN SODIUM 7.5 MG PO TABS: 7.5000 mg | ORAL_TABLET | Freq: Every day | ORAL | Status: DC

## 2020-06-17 MED ORDER — CARVEDILOL 3.125 MG PO TABS: 3.1250 mg | ORAL_TABLET | Freq: Two times a day (BID) | ORAL | 3 refills | Status: DC

## 2020-06-17 NOTE — Progress Notes (Signed)
CARDIAC REHAB PHASE I   PRE:  Rate/Rhythm: 84 paced  BP:  Supine:   Sitting: 100/67  Standing: 99/59   SaO2:   MODE:  Ambulation: 800 ft   POST:  Rate/Rhythm: 95 paced  BP:  Supine:   Sitting: 109/58  Standing:    SaO2: 98%RA 1052-1142 Pt walked 800 ft on RA with rolling walker and hand held asst. Checked BPs as documented above. Pt did not c/o dizziness this walk. To sitting on side of bed after walk. Reinforced sternal precautions. Encouraged IS. Discussed staying in the tube. Daughter present for ed. Pt and daughter voiced understanding of ed. Gave pt CHF booklet and discussed 2L FR, 2000 mg sodium restriction, daily weights, and signs/symptoms of CHF due to low EF. Gave heart healthy and low sodium diets. Gave walking instructions for ex. Pt has walker in room for home use. Discussed CRP 2 and will send referral letter to [facility]    Graylon Good, RN BSN  06/17/2020 11:38 AM

## 2020-06-17 NOTE — Discharge Summary (Signed)
[address]       [place] [address]             [phone]    Physician Discharge Summary  Patient ID: Ian Mcgee MRN: [id number] DOB/AGE: 1946/01/08 75 y.o.  Admit date: 06/05/2020 Discharge date: 06/17/2020  Admission Diagnoses:  Patient Active Problem List   Diagnosis Date Noted  . Encounter for preoperative dental examination   . Chronic apical periodontitis   . Gingivitis   . Teeth missing   . Gingival recession, generalized   . Defective dental restoration   . NSTEMI (non-ST elevated myocardial infarction) ([place]) 06/06/2020  . Essential hypertension   . Chest pain 06/05/2020  . Mitral regurgitation   . Angina of effort ([place]) 05/28/2020  . Acute on chronic systolic heart failure ([place])   . Pneumonia   . Syncope and collapse   . Hyperlipidemia   . Attention deficit disorder (ADD) 05/05/2019  . Status post biventricular pacemaker 07/16/2017  . Coronary artery disease 05/15/2017  . Cardiac pacemaker 05/10/2017  . Cardiomyopathy ([place]) 05/08/2017  . Dyslipidemia 05/08/2017  . Peripheral vascular disease ([place]) 05/08/2017  . Complete heart block ([place]) 06/10/2015  . Pacemaker reprogramming/check 06/10/2015   Discharge Diagnoses:  Patient Active Problem List   Diagnosis Date Noted  . S/P CABG x 3 06/10/2020  . S/P mitral valve repair + CABG x3 + maze procedure 06/10/2020  . S/P Maze operation for atrial fibrillation 06/10/2020  . Encounter for preoperative dental examination   . Chronic apical periodontitis   . Gingivitis   . Teeth missing   . Gingival recession, generalized   . Defective dental restoration   . NSTEMI (non-ST elevated myocardial infarction) ([place]) 06/06/2020  . Essential hypertension   . Chest pain 06/05/2020  . Mitral regurgitation   . Angina of effort ([place]) 05/28/2020  . Acute on chronic systolic heart failure ([place])   . Pneumonia   . Syncope and collapse   . Hyperlipidemia   . Attention deficit disorder (ADD)  05/05/2019  . Status post biventricular pacemaker 07/16/2017  . Coronary artery disease 05/15/2017  . Cardiac pacemaker 05/10/2017  . Cardiomyopathy ([place]) 05/08/2017  . Dyslipidemia 05/08/2017  . Peripheral vascular disease ([place]) 05/08/2017  . Complete heart block ([place]) 06/10/2015  . Pacemaker reprogramming/check 06/10/2015   Discharged Condition: good  History of Present Illness:  [name] is a 75 year old former smoker with a past medical history significant for nonischemic cardiomyopathy, complete heart block, status post placement of a biventricular pacer, mitral insufficiency, and cerebrovascular disease with bilateral 40 to 50% internal carotid artery stenoses.  He has longstanding intolerance to multiple cardiac meds including statins, beta-blockers, ACE inhibitor's, and ARB's.  He has a positive family history for coronary artery disease in his father, mother, and brother.  He normally enjoys walking trails frequently and playing tennis on a regular basis but about 4 weeks ago he noticed increasing shortness of breath with activity.  He presented to his cardiologist, Dr. Agustin Cree, on 05/27/2020 for initial evaluation.  EKG showed sinus rhythm with a paced ventricle but no acute ischemic changes.  A pro-BNP obtained that day was elevated to in excess of 1000.  Dr. Agustin Cree recommended proceeding with left heart catheterization and a referral to Dr. Burt Knack.  Right and left heart catheterization was carried out on 05/28/2020 electively and demonstrated severe calcific disease of the right coronary artery with an 80% proximal stenosis.  Additionally, there is a 60 to 70% stenosis  at the ostium of the posterior descending coronary artery.  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  The LAD was heavily calcified with a 50% proximal stenosis followed by an 60% mid stenosis.  The diagonal had a 50% proximal  stenosis.  The right heart cath showed elevated wedge pressure with large V waves and moderate pulmonary hypertension.  Further work-up included an transthoracic echocardiogram on 05/29/2020 showing moderate to severe mitral insufficiency with an eccentric, posteriorly directed jet.  The aortic valve was sclerotic but had no significant stenosis or insufficiency.  Tricuspid valve was normal in structure with mild TR.  Given these findings, CT surgery consultation for consideration of coronary bypass grafting and mitral valve repair was discussed with the patient by the cardiology team.  [name] preferred to discuss this further on an outpatient basis with his cardiologist, Dr. Agustin Cree before making a decision.  [facility] was [facility] on [facility]29[facility].  He saw Dr. Myriam Forehand on 05/31/2020 and a decision was made to proceed with transesophageal echo followed by referral to cardiothoracic surgery.  The echo was performed on 06/03/2020 and confirmed severe mitral insufficiency and ejection fraction of 40%.  He was discharged home following this outpatient procedure but then had an episode of chest pain with radiation to his jaw along with shortness of breath at home on 06/05/2020.  His chest pain subsided after taking sublingual nitroglycerin.  He contacted Dr. Agustin Cree who recommended evaluation in the emergency room.  He presented to [facility] ED.  Work-up in the ED included chest x-ray that showed clear lung fields and stable cardiomegaly.  High-sensitivity troponin was elevated mildly to 27.  BNP was 320.  Having ruled in for non-ST elevation myocardial infarction, he was admitted to the hospital and started on heparin and nitroglycerin infusions.  Hospital Course:  Since admission, he has been diuresed aggressively.  He has had no further chest pain and vital signs have been stable.   CT surgery has been asked to evaluate [name] for consideration of coronary bypass grafting  and mitral  valve repair with possible Maze procedure.  He was evaluated by Dr. Roxy Manns who felt the patient would be a good surgical candidate.  The risks and benefits of the procedure were explained to the patient and his family and he was agreeable to proceed.  [name] on 06/10/2020.  He underwent CABG x 2 utilizing LIMA to LAD, SVG to PDA, and SVG to Diagonal, MV Repair, and Left Sided MAZE procedure.  He also underwent bilateral endoscopic harvest of the greater saphenous vein from his thighs.  He tolerated the procedure without difficulty and was taken to the SICU in stable condition.  The patient was extubated the evening of surgery without difficulty.  During his stay in the SICU the patient was weaned off Milrinone and Levophed as hemodynamics allowed.  He was initiated on coumadin for his Mitral Valve Repair.  He is transferred to the cardiac telemetry floor on postop day #3.  The patient was maintaining NSR.  His pacing wires were removed without difficulty.  His CT output remained elevated, but we were able to remove his mediastinal chest tubes on 06/14/2020.  Pleural chest tubes were removed on 06/16/2020.  He developed an elevated creatinine level felt to be due to ATN.  He was also hyponatremic and volume overloaded. He was started on Spironolactone  and Lasix for management of this. He developed complaints of visual disturbances in his right eye.  These were described as dots of color of his field of vision.  Neurology exam was normal.  He was hypertensive and initiated on Cozaar.  Advanced heart failure consult was obtained who recommended transition to [facility].  His visual disturbances improved with improvement of blood pressure.  However these persisted.  He was instructed to follow up with an Ophthalmologist for exam.  He had some mild episodes of confusion overnight on post-op day 5 but this had resolved by the following day.  The hyponatremia and AKI gradually  resolved. Incisions were healing with no evidence of complication. Unfortunately, he developed hypotension and Entresto was discontinued.  The patients volume status improved and his Lasix was discontinued.  His INR is at 1.5 and he will be discharged home on 5 mg of coumadin daily.  He has a pain in his mouth that on exam appeared to be a cyst.  He was instructed to follow up with his dentist for further evaluation.  He is ambulating without difficulty.  His surgical incisions are healing without evidence of infection.  He is medically stable for discharge home today.  Consults: Advance Heart Failure  Significant Diagnostic Studies: angiography:   1.  Severe calcific stenosis of the RCA with 80% stenosis in the proximal vessel 2.  Moderate, flow obstructive stenosis at the ostium of the LAD and segmental disease in the mid LAD, significant flow limitation with an RFR of 0.83 3.  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  Right heart catheterization demonstrating elevated wedge pressure, large V waves, and moderate pulmonary hypertension likely secondary to left heart disease  ECHO: IMPRESSIONS    1. Severe mitral valve regurgitation. Mechanism is secondary MR,  Carpentier IIIb. Multiple jets are seen, with a posteriorly directed  eccentric jet due to posterior leaflet restriction. Systolic blunting of  pulmonary vein Doppler. The mitral valve is  grossly normal in appearance, with small degenerative area of P3 scallop  without prolapse or flail. Severe mitral valve regurgitation.  2. Left ventricular ejection fraction, by estimation, is 35 to 40%. The  left ventricle has moderately decreased function. The left ventricle  demonstrates regional wall motion abnormalities inferior and lateral.  3. Right ventricular systolic function is normal. The right ventricular  size is normal.  4. Left atrial size was moderately  dilated. No left atrial/left atrial  appendage thrombus was detected.  5. Right atrial size was mildly dilated.  6. The aortic valve is grossly normal. There is mild calcification of the  aortic valve. Aortic valve regurgitation is mild.  7. There is Moderate (Grade III) atheroma plaque involving the transverse  and descending aorta.   Treatments: surgery:   Date of Procedure:                06/10/2020  Preoperative Diagnosis:        Severe Multi-vessel Coronary Artery Disease  Severe Mitral Regurgitation  Recurrent Persistent Atrial Fibrillation  Postoperative Diagnosis:    Same  Procedure:        Coronary Artery Bypass Grafting x 3              Left Internal Mammary Artery to Distal Left Anterior Descending Coronary Artery             Saphenous Vein Graft to Posterior Descending Coronary Artery  Saphenous Vein Graft to Diagonal Branch Coronary Artery             Endoscopic Vein Harvest from Bilateral Thighs   Mitral Valve Repair             Edwards McCarthy-Adams IMR ETlogix Ring Annuloplasty (size 76mm, model #4100, serial [id number])   Maze Procedure              Complete left atrial lesion set using bipolar radiofrequency and cryothermy ablation             Clipping of left atrial appendage (Atricure Pro245 left atrial clip, size 28mm)               Surgeon:        Valentina Gu. Roxy Manns, MD  Assistant:       Ellwood Handler, PA-C  Discharge Exam: Blood pressure 115/67, pulse 95, temperature 98.6 F (37 C), temperature source Oral, resp. rate 20, height 5\' 11"  (1.803 m), weight 71.6 kg, SpO2 98 %.  General appearance: alert, cooperative and no distress Heart: regular rate and rhythm Lungs: clear to auscultation bilaterally Abdomen: soft, non-tender; bowel sounds normal; no masses,  no organomegaly Extremities: edema trace-1+ pitting Wound: clean and dry, ecchymosis BLE    Discharge Medications:  The patient has been discharged on:   1.Beta  Blocker:  Yes [ X  ]                              No   [   ]                              If No, reason:  2.Ace Inhibitor/ARB: Yes [   ]                                     No  [ X   ]                                     If No, reason: labile BP  3.Statin:   Yes [ X  ]                  No  [   ]                  If No, reason:  4.Ecasa:  Yes  [ X  ]                  No   [   ]                  If No, reason:     Allergies as of 06/17/2020      Reactions   Ciprofloxacin Other (See Comments)   Bleeding (intolerance)   Crestor [rosuvastatin Calcium] Other (See Comments)   Muscle twinges   Nexletol [bempedoic Acid] Other (See Comments)   Muscle and joint pain      Medication List    STOP taking these medications   amoxicillin 500 MG capsule Commonly known as: AMOXIL   multivitamin capsule     TAKE these medications   aspirin 81 MG EC tablet Take 1 tablet (81 mg total) by  mouth daily. Swallow whole.   atorvastatin 40 MG tablet Commonly known as: LIPITOR Take 1 tablet (40 mg total) by mouth daily. Start taking on: Jun 18, 2020   B-12 (Methylcobalamin) 1000 MCG Subl Place 1,000 mcg under the tongue every morning.   carvedilol 3.125 MG tablet Commonly known as: COREG Take 1 tablet (3.125 mg total) by mouth 2 (two) times daily with a meal.   furosemide 40 MG tablet Commonly known as: Lasix Take 40 mg daily for weight gain of 3 lbs overnight or 5 lbs in 1 week. What changed:   how much to take  how to take this  when to take this  reasons to take this  additional instructions   losartan 25 MG tablet Commonly known as: Cozaar Take 1 tablet (25 mg total) by mouth daily.   nitroGLYCERIN 0.4 MG SL tablet Commonly known as: NITROSTAT Place 1 tablet (0.4 mg total) under the tongue every 5 (five) minutes x 3 doses as needed for chest pain.   PROBIOTIC PO Take 1 tablet by mouth every morning.   psyllium 58.6 % packet Commonly known as: METAMUCIL Take 1  packet by mouth every morning.   spironolactone 25 MG tablet Commonly known as: ALDACTONE Take 1 tablet (25 mg total) by mouth daily. Start taking on: Jun 18, 2020   warfarin 5 MG tablet Commonly known as: COUMADIN Take 1 tablet (5 mg total) by mouth daily at 4 PM.            Durable Medical Equipment  (From admission, onward)         Start     Ordered   06/16/20 1225  For home use only DME Shower stool  Once        06/16/20 1225   06/14/20 0753  For home use only DME Walker rolling  Once       Question Answer Comment  Walker: With [place]   Patient needs a walker to treat with the following condition Physical deconditioning      06/14/20 0753          Follow-up Information    Rexene Alberts, MD Follow up on 07/05/2020.   Specialty: Cardiothoracic Surgery Why: Appointment is at 4:30, please get CXR at 4:00 at [place] located on first floor of our office building Contact information: [place] [address] [phone]        [facility] Follow up.   Why: rolling walker and shower chair arranged- to be delivered to room prior to discharge Contact information: [place] [address] [phone]        Bensimhon, Shaune Pascal, MD Follow up on 06/24/2020.   Specialty: Cardiology Why: Appointment is at [place] on 06/24/2020.  Gate code is Psychiatric nurse information: [address] [place] [address] [phone]        [facility] Follow up on 06/30/2020.   Specialty: Cardiothoracic Surgery Why: Appointment is at 10:00 with nurse for suture removal Contact information: [place], [place] [phone]              Signed: Ellwood Handler, PA-C 06/17/2020, 10:54 AM

## 2020-06-17 NOTE — Progress Notes (Addendum)
Advanced Heart Failure Team Rounding Note   Primary Physician:Khan, Jaber Primary Cardiologist: Dr. Jenne Campus Primary CT surgeon:  Dr. Darylene Price  Reason for Consultation: systolic CHF med titration   HPI:    He is POD#7.  Diuretics held yesterday received his second dose of entresto yesterday am, developed hypotension, required two 250cc boluses to support bp.  Repeat ECHO yesterday with worsening EF now about 30%, no MR after repair, mild AI, normal RV function.  Renal fx stable.  Still getting short of breath with ambulation but overall feels like he is recovering.  White count has trended up a bit but no fever or cough.  Chest x-ray with interval small bilateral effusions.    Objective:    Vital Signs:   Temp:  [98 F (36.7 C)-98.4 F (36.9 C)] 98.4 F (36.9 C) (05/05 0336) Pulse Rate:  [72-98] 82 (05/05 0336) Resp:  [10-20] 12 (05/05 0400) BP: (76-149)/(48-82) 102/67 (05/05 0400) SpO2:  [94 %-100 %] 100 % (05/05 0336) Weight:  [71.6 kg] 71.6 kg (05/05 0528) Last BM Date: 06/16/20  Weight change: Filed Weights   06/15/20 0355 06/16/20 0427 06/17/20 0528  Weight: 69.9 kg 68.7 kg 71.6 kg    Intake/Output:   Intake/Output Summary (Last 24 hours) at 06/17/2020 0749 Last data filed at 06/16/2020 2319 Gross per 24 hour  Intake 740.08 ml  Output 1300 ml  Net -559.92 ml      Physical Exam    General:  Well appearing. No resp difficulty HEENT: normal Neck: supple. JVP 8-9  . Carotids 2+ bilat; no bruits. No lymphadenopathy or thyromegaly appreciated. Cor: chest incision site clean, dry, intact, PMI nondisplaced. Regular rate & rhythm. No rubs, 2/6 systolic murmur Lungs: diminished in bases Abdomen: soft, nontender, nondistended. No hepatosplenomegaly. No bruits or masses. Good bowel sounds. Extremities: no cyanosis, clubbing, rash, trace LE edema  Neuro: alert & orientedx3, cranial nerves grossly intact. moves all 4 extremities w/o difficulty. Affect  pleasant   Telemetry   Atrial sensed, BIV paced rate high 80's  EKG    No new ecg  Labs   Basic Metabolic Panel: Recent Labs  Lab 06/10/20 2136 06/11/20 0500 06/11/20 1624 06/11/20 2323 06/13/20 0422 06/14/20 0131 06/15/20 0147 06/16/20 0634 06/17/20 0204  NA 136 134* 131*   < > 127* 126* 132* 133* 131*  K 3.6 5.0 4.8   < > 4.1 4.2 3.8 4.2 4.5  CL 108 105 102   < > 97* 95* 97* 100 99  CO2 23 23 19*   < > 23 21* 24 27 26   GLUCOSE 151* 123* 143*   < > 107* 106* 93 105* 112*  BUN 14 16 23    < > 32* 24* 17 17 23   CREATININE 0.81 1.00 1.88*   < > 1.35* 1.08 1.00 0.92 1.02  CALCIUM 7.2* 7.8* 7.9*   < > 7.9* 8.3* 8.3* 8.3* 8.4*  MG 2.7* 2.5* 2.5*  --   --   --  2.2  --   --    < > = values in this interval not displayed.    Liver Function Tests: No results for input(s): AST, ALT, ALKPHOS, BILITOT, PROT, ALBUMIN in the last 168 hours. No results for input(s): LIPASE, AMYLASE in the last 168 hours. No results for input(s): AMMONIA in the last 168 hours.  CBC: Recent Labs  Lab 06/13/20 0422 06/14/20 0131 06/15/20 0147 06/16/20 0634 06/17/20 0204  WBC 7.5 10.5 10.3 11.4* 13.9*  HGB  8.0* 8.6* 8.8* 9.1* 8.9*  HCT 23.6* 25.2* 25.2* 27.0* 26.7*  MCV 95.9 95.5 93.7 95.1 95.4  PLT 128* 178 206 234 255    Cardiac Enzymes: No results for input(s): CKTOTAL, CKMB, CKMBINDEX, TROPONINI in the last 168 hours.  BNP: BNP (last 3 results) Recent Labs    06/05/20 1936 06/15/20 0147  BNP 320.0* 492.8*    ProBNP (last 3 results) Recent Labs    05/27/20 1535  PROBNP 1,028*     CBG: Recent Labs  Lab 06/13/20 0645 06/13/20 1118 06/13/20 1828 06/13/20 2038 06/14/20 1533  GLUCAP 93 103* 129* 117* 140*    Coagulation Studies: Recent Labs    06/15/20 0147 06/16/20 0151 06/17/20 0204  LABPROT 15.4* 16.7* 18.4*  INR 1.2 1.4* 1.5*     Imaging   ECHOCARDIOGRAM COMPLETE  Result Date: 06/16/2020    ECHOCARDIOGRAM REPORT   Patient Name:   Ian Mcgee  Date of Exam: 06/16/2020 Medical Rec #:  [id number]         Height:       71.0 in Accession #:    [id number]        Weight:       151.5 lb Date of Birth:  [birth year]         BSA:          1.874 m Patient Age:    75 years          BP:           104/65 mmHg Patient Gender: M                 HR:           82 bpm. Exam Location:  Inpatient Procedure: 3D Echo, 2D Echo, Cardiac Doppler, Color Doppler and Strain Analysis Indications:    I42.0 Dilated cardiomyopathy. Post mitral valve repair.  History:        Patient has prior history of Echocardiogram examinations, most                 recent 06/03/2020. Cardiomyopathy and CHF, CAD and Previous                 Myocardial Infarction, Abnormal ECG and Prior CABG, Mitral Valve                 Disease, Arrythmias:Atrial Fibrillation, Signs/Symptoms:Syncope;                 Risk Factors:Hypertension. Mitral valve repair. Maze procedure.  Sonographer:    Roseanna Rainbow RDCS Referring Phys: [place]  1. Diffuse hypokinesis abnormal septal motion inferior basal hypokinesis EF worse than seen on intra operative TEE 06/03/20 35-40% at that time . Left ventricular ejection fraction, by estimation, is 25 to 30%. The left ventricle has severely decreased function. The left ventricle demonstrates global hypokinesis. The left ventricular internal cavity size was moderately dilated. Left ventricular diastolic parameters are indeterminate.  2. Right ventricular systolic function is normal. The right ventricular size is normal. There is normal pulmonary artery systolic pressure.  3. Left atrial size was mildly dilated.  4. Post repair with 28 mm Edwards-McCarthy ring Also had MAZE and LAA clipping No significant residual MR. Mean gradient at HR of 81 bpm unchanged from OR at 3 mmHg with MVA 2.6 cm2. The mitral valve has been repaired/replaced. No evidence of mitral valve regurgitation. No evidence of mitral stenosis.  5. The aortic valve is tricuspid. There is mild  calcification of the aortic valve. Aortic valve regurgitation is mild. Mild aortic valve sclerosis is present, with no evidence of aortic valve stenosis.  6. The inferior vena cava is normal in size with greater than 50% respiratory variability, suggesting right atrial pressure of 3 mmHg. FINDINGS  Left Ventricle: Diffuse hypokinesis abnormal septal motion inferior basal hypokinesis EF worse than seen on intra operative TEE 06/03/20 35-40% at that time. Left ventricular ejection fraction, by estimation, is 25 to 30%. The left ventricle has severely  decreased function. The left ventricle demonstrates global hypokinesis. The left ventricular internal cavity size was moderately dilated. There is no left ventricular hypertrophy. Left ventricular diastolic parameters are indeterminate. Right Ventricle: The right ventricular size is normal. No increase in right ventricular wall thickness. Right ventricular systolic function is normal. There is normal pulmonary artery systolic pressure. The tricuspid regurgitant velocity is 2.11 m/s, and  with an assumed right atrial pressure of 3 mmHg, the estimated right ventricular systolic pressure is [id number] mmHg. Left Atrium: Left atrial size was mildly dilated. Right Atrium: Right atrial size was normal in size. Pericardium: There is no evidence of pericardial effusion. Mitral Valve: Post repair with 28 mm Edwards-McCarthy ring Also had MAZE and LAA clipping No significant residual MR. Mean gradient at HR of 81 bpm unchanged from OR at 3 mmHg with MVA 2.6 cm2. The mitral valve has been repaired/replaced. No evidence of mitral valve regurgitation. No evidence of mitral valve stenosis. MV peak gradient, 12.2 mmHg. The mean mitral valve gradient is 3.3 mmHg. Tricuspid Valve: The tricuspid valve is normal in structure. Tricuspid valve regurgitation is mild . No evidence of tricuspid stenosis. Aortic Valve: The aortic valve is tricuspid. There is mild calcification of the aortic valve.  Aortic valve regurgitation is mild. Mild aortic valve sclerosis is present, with no evidence of aortic valve stenosis. Aortic valve mean gradient measures 9.0 mmHg. Aortic valve peak gradient measures 16.0 mmHg. Aortic valve area, by VTI measures 2.40 cm. Pulmonic Valve: The pulmonic valve was normal in structure. Pulmonic valve regurgitation is not visualized. No evidence of pulmonic stenosis. Aorta: The aortic root is normal in size and structure. Venous: The inferior vena cava is normal in size with greater than 50% respiratory variability, suggesting right atrial pressure of 3 mmHg. IAS/Shunts: No atrial level shunt detected by color flow Doppler. Additional Comments: A device lead is visualized in the right ventricle and right atrium.  LEFT VENTRICLE PLAX 2D LVIDd:         5.60 cm      Diastology LVIDs:         5.10 cm      LV e' medial:    4.33 cm/s LV PW:         1.70 cm      LV E/e' medial:  22.2 LV IVS:        1.00 cm      LV e' lateral:   4.47 cm/s LVOT diam:     2.00 cm      LV E/e' lateral: 21.5 LV SV:         86 LV SV Index:   46           2D Longitudinal Strain LVOT Area:     3.14 cm     2D Strain GLS Avg:     -7.5 %  LV Volumes (MOD) LV vol d, MOD A2C: 110.0 ml LV vol d, MOD A4C: 128.0 ml LV vol s, MOD A2C: 79.7 ml  LV vol s, MOD A4C: 87.8 ml LV SV MOD A2C:     30.3 ml LV SV MOD A4C:     128.0 ml LV SV MOD BP:      34.7 ml RIGHT VENTRICLE            IVC RV S prime:     7.28 cm/s  IVC diam: 2.10 cm TAPSE (M-mode): 0.8 cm LEFT ATRIUM             Index       RIGHT ATRIUM           Index LA diam:        4.10 cm 2.19 cm/m  RA Area:     17.60 cm LA Vol (A2C):   68.3 ml 36.45 ml/m RA Volume:   52.70 ml  28.13 ml/m LA Vol (A4C):   84.7 ml 45.21 ml/m LA Biplane Vol: 75.5 ml 40.30 ml/m  AORTIC VALVE AV Area (Vmax):    2.69 cm AV Area (Vmean):   2.57 cm AV Area (VTI):     2.40 cm AV Vmax:           200.00 cm/s AV Vmean:          140.500 cm/s AV VTI:            0.358 m AV Peak Grad:      16.0 mmHg AV  Mean Grad:      9.0 mmHg LVOT Vmax:         171.00 cm/s LVOT Vmean:        115.000 cm/s LVOT VTI:          0.273 m LVOT/AV VTI ratio: 0.76  AORTA Ao Root diam: 3.30 cm Ao Asc diam:  3.00 cm MITRAL VALVE                TRICUSPID VALVE MV Area (PHT): 2.61 cm     TR Peak grad:   17.8 mmHg MV Area VTI:   2.02 cm     TR Vmax:        211.00 cm/s MV Peak grad:  12.2 mmHg MV Mean grad:  3.3 mmHg     SHUNTS MV Vmax:       1.75 m/s     Systemic VTI:  0.27 m MV Vmean:      82.6 cm/s    Systemic Diam: 2.00 cm MV Decel Time: 291 msec MV E velocity: 96.09 cm/s MV A velocity: 100.30 cm/s MV E/A ratio:  0.96 Jenkins Rouge MD Electronically signed by Jenkins Rouge MD Signature Date/Time: 06/16/2020/11:43:37 AM    Final      Medications:     Current Medications: . aspirin EC  81 mg Oral Daily  . atorvastatin  40 mg Oral Daily  . bisacodyl  10 mg Oral Daily   Or  . bisacodyl  10 mg Rectal Daily  . carvedilol  3.125 mg Oral BID WC  . chlorhexidine  15 mL Mouth Rinse BID  . docusate sodium  200 mg Oral Daily  . enoxaparin (LOVENOX) injection  30 mg Subcutaneous QHS  . ferrous [id number] C-folic acid  1 capsule Oral Q breakfast  . mouth rinse  15 mL Mouth Rinse q12n4p  . pantoprazole  40 mg Oral Daily  . polyethylene glycol  17 g Oral Daily  . sodium chloride flush  3 mL Intravenous Q12H  . spironolactone  25 mg Oral Daily  . warfarin  7.5 mg Oral q1600  .  Warfarin - Physician Dosing Inpatient   Does not apply q1600    Infusions: . sodium chloride Stopped (06/13/20 1549)       Assessment/Plan   Acute on Chronic combined systolic and diastolic CHF: -Patient has history of nonischemic cardiomyopathy with LVEF 35-40% 2019. S/p BiV PPM placement with improvement in EF . Now likely an element of ischemic cardiomyopathy given progression of CAD -ECHO 05/2020 with EF 40%, moderate-severe MR, normal RV function, G2DD. -LHC 05/2020 with multivessel disease now s/p CABG x3 and MVR with maze  procedure -Currently NYHA class III symptoms, mildly volume up -reviewed echo images from 06/16/20 EF may be slightly worse but not much doesn't change management -Continue coreg 3.125mg  BID, spironolactone 25mg  daily -bp did not tolerate entresto, switch back to losartan 25mg , give PRN lasix 40mg  on d/c -may be able to add [email or web address] in the outpatient setting  Severe MR s/p MV Repair: -Intraop TEE with moderate MR.   -No residual MR on post repair TEE -On warfarin which is being titrated  Multivessel CAD s/p CABG: -POD 7 from 3v CABG with Dr. Roxy Manns. -Continue ASA 81mg , warfarin and atorvastatin 40mg  daily -Continue coreg 3.125mg  BID, spironolactone 25mg  daily  AKI: -Cr peaked at 2.31, Likely ATN. -Currently Cr 1.02  Hyponatremia: -Likely hypervolemic hypernatremia in the setting of volume overload. Possible component of pain associated hyponatremia -improved with diuresis  Paroxysmal Afib/flutter: -S/p MAZE procedure with LAA clipping. -Continue coreg -Warfarin per CV surgery  History of CHB: -S/p BiV PPM placement, functioning appropriately   Length of Stay: Opdyke West, MD  06/17/2020, 7:49 AM    Okay for discharge from our standpoint. Arranged follow up on 5/12 at 9:00am in AHF clinic  Cardiac Meds: Spironolactone 25mg  Carvedilol 3.125mg  BID Losartan 25mg  QHS Lasix 40mg  PRN for weight gain Atorvastatin 40mg  Asa 81mg  Warfarin (dose per CT surgery)   Advanced Heart Failure Team Pager [phone] (M-F; 7a - 4p)  Please contact [facility] for night-coverage after hours (4p -7a ) and weekends on [email or web address]   Patient seen and examined with the above-signed Advanced Practice Provider and/or Housestaff. I personally reviewed laboratory data, imaging studies and relevant notes. I independently examined the patient and formulated the important aspects of the plan. I have edited the note to reflect any of my changes or salient points. I have personally  discussed the plan with the patient and/or family.  BP was low yesterday. Improved with IVF and holding Entresto. Able to walk hall today with SBP > 100  Denies SOB, orthopnea or PND  General:  Well appearing. No resp difficulty HEENT: normal Neck: supple. no JVD. Carotids 2+ bilat; no bruits. No lymphadenopathy or thryomegaly appreciated. Cor: PMI nondisplaced. Regular rate & rhythm. No rubs, gallops or murmurs. Lungs: clear Abdomen: soft, nontender, nondistended. No hepatosplenomegaly. No bruits or masses. Good bowel sounds. Extremities: no cyanosis, clubbing, rash, edema Neuro: alert & orientedx3, cranial nerves grossly intact. moves all 4 extremities w/o difficulty. Affect pleasant  He is stable for d/c from HF standpoint. Switch Entresto to losartan. Use lasix PRN for weight gain. Will see next week in HF Clinic and try to get on SGLT2i   Glori Bickers, MD  12:48 PM

## 2020-06-17 NOTE — Progress Notes (Signed)
[address]       [place], [address]             [phone]      7 Days Post-Op Procedure(s) (LRB): MITRAL VALVE REPAIR USING CARPENTIER MCCARTHY-ADAMS RING SIZE 28MM (N/A) CORONARY ARTERY BYPASS GRAFTING (CABG) X THREE USING LEFT INTERNAL MAMMARY ARTERY AND BILATERAL LEGS GREATER SAPHENOUS VEIN HARVESTED ENDOSCOPICALLY (N/A) MAZE (N/A) TRANSESOPHAGEAL ECHOCARDIOGRAM (TEE) (N/A) CLIPPING OF ATRIAL APPENDAGE USING  ATRICURE PRO2 CLIP SIZE 45MM   Subjective:  Patient doing okay.  States he continues to have some issues with his vision.  He also notes he remains confused at times, which he is thinking is due to pain medication use.  He states he feels his swelling is improved, but he still feels like he has some water weight in his legs.  He continues to have issues with his bowel habits being overly soft and messy.  Finally he had a sharp pain with an involuntary yawn which resolved quickly.  + ambulation  Objective: Vital signs in last 24 hours: Temp:  [98 F (36.7 C)-98.4 F (36.9 C)] 98.4 F (36.9 C) (05/05 0336) Pulse Rate:  [72-98] 82 (05/05 0336) Cardiac Rhythm: Ventricular paced (05/04 1900) Resp:  [10-20] 12 (05/05 0400) BP: (76-149)/(48-82) 102/67 (05/05 0400) SpO2:  [94 %-100 %] 100 % (05/05 0336) Weight:  [71.6 kg] 71.6 kg (05/05 0528)  Intake/Output from previous day: 05/04 0701 - 05/05 0700 In: 740.1 [P.O.:490; IV Piggyback:250.1] Out: 1300 [Urine:1300]  General appearance: alert, cooperative and no distress Heart: regular rate and rhythm Lungs: clear to auscultation bilaterally Abdomen: soft, non-tender; bowel sounds normal; no masses,  no organomegaly Extremities: edema trace-1+ pitting Wound: clean and dry, ecchymosis BLE  Lab Results: Recent Labs    06/16/20 0634 06/17/20 0204  WBC 11.4* 13.9*  HGB 9.1* 8.9*  HCT 27.0* 26.7*  PLT 234 255   BMET:  Recent Labs    06/16/20 0634 06/17/20 0204  NA 133* 131*  K 4.2 4.5  CL  100 99  CO2 27 26  GLUCOSE 105* 112*  BUN 17 23  CREATININE 0.92 1.02  CALCIUM 8.3* 8.4*    PT/INR:  Recent Labs    06/17/20 0204  LABPROT 18.4*  INR 1.5*   ABG    Component Value Date/Time   PHART 7.303 (L) 06/10/2020 1812   HCO3 23.6 06/10/2020 1812   TCO2 25 06/10/2020 1812   ACIDBASEDEF 3.0 (H) 06/10/2020 1812   O2SAT 57.5 06/13/2020 0415   CBG (last 3)  Recent Labs    06/14/20 1533  GLUCAP 140*    Assessment/Plan: S/P Procedure(s) (LRB): MITRAL VALVE REPAIR USING CARPENTIER MCCARTHY-ADAMS RING SIZE 28MM (N/A) CORONARY ARTERY BYPASS GRAFTING (CABG) X THREE USING LEFT INTERNAL MAMMARY ARTERY AND BILATERAL LEGS GREATER SAPHENOUS VEIN HARVESTED ENDOSCOPICALLY (N/A) MAZE (N/A) TRANSESOPHAGEAL ECHOCARDIOGRAM (TEE) (N/A) CLIPPING OF ATRIAL APPENDAGE USING  ATRICURE PRO2 CLIP SIZE 45MM  1. CV- NSR, paced-- hypotensive with Entresto, this has since been discontinued, remains on Coreg, AHF following 2. INR 1.5, slowly trending up, will likely d/c on 5 mg daily, will confirm with Dr. Roxy Manns 3. Pulm- no acute issues, off oxygen, continue IS 4. Renal- creatinine remains stable, K is at 4.2, lasix has been discontinued, remains on Spiro 5. Post operative blood loss anemia, stable continue iron 6. ENT- visual disturbances described as what appears to be floaters, neuro exam is normal, recommended he follow up with Optomology at discharge 7. Mouth sore-there  appears to be a cyst present behind his right front incisor on the lower jaw, recommended salt water swishes and follow up with his dentist 8. Dispo- patient stable, will plan to d/c home today if okay with AHF team and final medication recommendations   LOS: 12 days    Ellwood Handler, PA-C 06/17/2020

## 2020-06-17 NOTE — Progress Notes (Signed)
Discharge instructions given to patient. Daughter present. PIV removed. Telemetry box removed, CCMD notified. Patient taken to vehicle by staff in wheelchair.  Daymon Larsen, RN

## 2020-06-17 NOTE — Consult Note (Signed)
   [facility] CM Inpatient Consult   06/17/2020  Ian Mcgee 1945/08/30 [id number]  [facility] [ACO] Patient: Medicare CMS CDE  Primary Care Provider: Mateo Flow, MD, [facility] is listed to provide the transition of care follow up  Patient screened for length of stay hospitalization  to assess for potential [place] Management service needs for post hospital transition.  Review of patient's medical record reveals patient is also being recommended OP Cardiac Rehab in [place] noted. Patient already transitioned home on rounds.  Plan:  Follow up with a [place] Coordinator for post hospital follow up and assess for complex disease management needs.  For questions contact:   Natividad Brood, RN BSN [facility] Liaison  [phone] business mobile phone Toll free office [phone]  Fax number: [phone] [email or web address]

## 2020-06-18 ENCOUNTER — Telehealth (HOSPITAL_COMMUNITY): Payer: Self-pay

## 2020-06-18 ENCOUNTER — Telehealth: Payer: Self-pay

## 2020-06-18 NOTE — Telephone Encounter (Signed)
Cardiac rehab referral for Ph.II faxed to [facility]. 

## 2020-06-18 NOTE — Telephone Encounter (Signed)
Contacted patient and answered his questions. Advised patient that compared to his previous transmission, that settings on his device appeared to be the same since his recent bypass surgery 2 weeks ago. Patient would like to know if he should follow up with Dr. Curt Bears since he has had the recent bypass surgery. Routing to Dr. Curt Bears and Venida Jarvis to see what Dr. Curt Bears recommends.

## 2020-06-18 NOTE — Telephone Encounter (Signed)
The patient called to make sure we got his transmission. He wanted to make sure everything is okay since he had his bypass surgery. Transmission received.

## 2020-06-21 ENCOUNTER — Ambulatory Visit (INDEPENDENT_AMBULATORY_CARE_PROVIDER_SITE_OTHER): Payer: Medicare Other

## 2020-06-21 DIAGNOSIS — I255 Ischemic cardiomyopathy: Secondary | ICD-10-CM

## 2020-06-21 NOTE — Telephone Encounter (Signed)
Spoke with patient. Patient states that he will call the office if he has any problems with his device. No current problems at this time. Dr. Curt Bears recommended patient follow up with cardiologist as directed and no need to bring patient in the office at this time unless he starts having device related issues.

## 2020-06-22 ENCOUNTER — Other Ambulatory Visit: Payer: Self-pay

## 2020-06-22 ENCOUNTER — Ambulatory Visit (INDEPENDENT_AMBULATORY_CARE_PROVIDER_SITE_OTHER): Payer: Medicare Other

## 2020-06-22 DIAGNOSIS — Z5181 Encounter for therapeutic drug level monitoring: Secondary | ICD-10-CM | POA: Diagnosis not present

## 2020-06-22 DIAGNOSIS — Z7901 Long term (current) use of anticoagulants: Secondary | ICD-10-CM | POA: Insufficient documentation

## 2020-06-22 DIAGNOSIS — Z9889 Other specified postprocedural states: Secondary | ICD-10-CM

## 2020-06-22 HISTORY — DX: Long term (current) use of anticoagulants: Z79.01

## 2020-06-22 LAB — CUP PACEART REMOTE DEVICE CHECK
Battery Remaining Longevity: 102 mo
Battery Remaining Percentage: 95.5 %
Battery Voltage: 2.98 V
Brady Statistic AP VP Percent: 1 %
Brady Statistic AP VS Percent: 1 %
Brady Statistic AS VP Percent: 98 %
Brady Statistic AS VS Percent: 1 %
Brady Statistic RA Percent Paced: 1 %
Date Time Interrogation Session: 20220506161343
Implantable Lead Implant Date: 20130812
Implantable Lead Implant Date: 20190513
Implantable Lead Implant Date: 20190513
Implantable Lead Location: 753858
Implantable Lead Location: 753859
Implantable Lead Location: 753860
Implantable Pulse Generator Implant Date: 20190513
Lead Channel Impedance Value: 290 Ohm
Lead Channel Impedance Value: 330 Ohm
Lead Channel Impedance Value: 710 Ohm
Lead Channel Pacing Threshold Amplitude: 0.875 V
Lead Channel Pacing Threshold Amplitude: 0.875 V
Lead Channel Pacing Threshold Amplitude: 1.375 V
Lead Channel Pacing Threshold Pulse Width: 0.2 ms
Lead Channel Pacing Threshold Pulse Width: 0.4 ms
Lead Channel Pacing Threshold Pulse Width: 0.6 ms
Lead Channel Sensing Intrinsic Amplitude: 12 mV
Lead Channel Sensing Intrinsic Amplitude: 5 mV
Lead Channel Setting Pacing Amplitude: 1.875
Lead Channel Setting Pacing Amplitude: 2 V
Lead Channel Setting Pacing Amplitude: 2.375
Lead Channel Setting Pacing Pulse Width: 0.4 ms
Lead Channel Setting Pacing Pulse Width: 0.6 ms
Lead Channel Setting Sensing Sensitivity: 4 mV
Pulse Gen Model: 3562
Pulse Gen Serial Number: 9431568

## 2020-06-22 LAB — POCT INR: INR: 1.4 — AB (ref 2.0–3.0)

## 2020-06-22 NOTE — Patient Instructions (Signed)
Take 2 tablets today only and then continue taking 1 tablet Daily.  Repeat INR 1 week.  A full discussion of the nature of anticoagulants has been carried out.  A benefit risk analysis has been presented to the patient, so that they understand the justification for choosing anticoagulation at this time. The need for frequent and regular monitoring, precise dosage adjustment and compliance is stressed.  Side effects of potential bleeding are discussed.  The patient should avoid any OTC items containing aspirin or ibuprofen, and should avoid great swings in general diet.  Avoid alcohol consumption.  Call if any signs of abnormal bleeding. [phone]

## 2020-06-23 ENCOUNTER — Other Ambulatory Visit: Payer: Self-pay | Admitting: *Deleted

## 2020-06-23 DIAGNOSIS — Z6820 Body mass index (BMI) 20.0-20.9, adult: Secondary | ICD-10-CM | POA: Diagnosis not present

## 2020-06-23 DIAGNOSIS — I5020 Unspecified systolic (congestive) heart failure: Secondary | ICD-10-CM | POA: Diagnosis not present

## 2020-06-23 DIAGNOSIS — Z1331 Encounter for screening for depression: Secondary | ICD-10-CM | POA: Diagnosis not present

## 2020-06-23 DIAGNOSIS — Z9181 History of falling: Secondary | ICD-10-CM | POA: Diagnosis not present

## 2020-06-23 NOTE — Patient Outreach (Signed)
[facility]) Care Management  06/23/2020  MACKENZY EISENBERG May 23, 1945 [id number]  Referral Received 5/5 Discharged 5/5 Valve Repair Provider to completed transition of care  Initial Outreach-Unsuccessful  RN attempted outreach call however unsuccessful. RN able to leave a HIPAA approved voice message requesting a call back.  Will initiate another outreach over the next week for pending [facility] services.  Raina Mina, RN Care Management Coordinator [place] Office [phone]

## 2020-06-24 ENCOUNTER — Ambulatory Visit (HOSPITAL_COMMUNITY)
Admit: 2020-06-24 | Discharge: 2020-06-24 | Disposition: A | Discharge status: Home / Self Care | Payer: Medicare Other | Attending: Internal Medicine | Admitting: Internal Medicine

## 2020-06-24 ENCOUNTER — Other Ambulatory Visit: Payer: Self-pay

## 2020-06-24 ENCOUNTER — Encounter (HOSPITAL_COMMUNITY): Payer: Self-pay | Admitting: Internal Medicine

## 2020-06-24 VITALS — BP 100/60 | HR 98 | Wt 150.6 lb

## 2020-06-24 DIAGNOSIS — I739 Peripheral vascular disease, unspecified: Secondary | ICD-10-CM | POA: Diagnosis not present

## 2020-06-24 DIAGNOSIS — I428 Other cardiomyopathies: Secondary | ICD-10-CM | POA: Insufficient documentation

## 2020-06-24 DIAGNOSIS — I5022 Chronic systolic (congestive) heart failure: Secondary | ICD-10-CM | POA: Diagnosis not present

## 2020-06-24 DIAGNOSIS — I480 Paroxysmal atrial fibrillation: Secondary | ICD-10-CM | POA: Diagnosis not present

## 2020-06-24 DIAGNOSIS — Z8249 Family history of ischemic heart disease and other diseases of the circulatory system: Secondary | ICD-10-CM | POA: Diagnosis not present

## 2020-06-24 DIAGNOSIS — Z950 Presence of cardiac pacemaker: Secondary | ICD-10-CM | POA: Insufficient documentation

## 2020-06-24 DIAGNOSIS — I340 Nonrheumatic mitral (valve) insufficiency: Secondary | ICD-10-CM | POA: Diagnosis not present

## 2020-06-24 DIAGNOSIS — I2510 Atherosclerotic heart disease of native coronary artery without angina pectoris: Secondary | ICD-10-CM | POA: Insufficient documentation

## 2020-06-24 DIAGNOSIS — Z7982 Long term (current) use of aspirin: Secondary | ICD-10-CM | POA: Diagnosis not present

## 2020-06-24 DIAGNOSIS — Z9889 Other specified postprocedural states: Secondary | ICD-10-CM | POA: Diagnosis not present

## 2020-06-24 DIAGNOSIS — Z951 Presence of aortocoronary bypass graft: Secondary | ICD-10-CM | POA: Diagnosis not present

## 2020-06-24 DIAGNOSIS — Z79899 Other long term (current) drug therapy: Secondary | ICD-10-CM | POA: Diagnosis not present

## 2020-06-24 DIAGNOSIS — Z7901 Long term (current) use of anticoagulants: Secondary | ICD-10-CM | POA: Insufficient documentation

## 2020-06-24 DIAGNOSIS — Z87891 Personal history of nicotine dependence: Secondary | ICD-10-CM | POA: Insufficient documentation

## 2020-06-24 DIAGNOSIS — Z881 Allergy status to other antibiotic agents status: Secondary | ICD-10-CM | POA: Diagnosis not present

## 2020-06-24 HISTORY — DX: Generalized gingival recession, unspecified: K06.020

## 2020-06-24 HISTORY — DX: Chronic gingivitis, plaque induced: K05.10

## 2020-06-24 HISTORY — DX: Encounter for other preprocedural examination: Z01.818

## 2020-06-24 HISTORY — DX: Acute on chronic systolic (congestive) heart failure: I50.23

## 2020-06-24 HISTORY — DX: Chronic apical periodontitis: K04.5

## 2020-06-24 HISTORY — DX: Complete loss of teeth, unspecified cause, unspecified class: K08.109

## 2020-06-24 HISTORY — DX: Essential (primary) hypertension: I10

## 2020-06-24 LAB — BASIC METABOLIC PANEL
Anion gap: 7 (ref 5–15)
BUN: 15 mg/dL (ref 8–23)
CO2: 25 mmol/L (ref 22–32)
Calcium: 9.3 mg/dL (ref 8.9–10.3)
Chloride: 101 mmol/L (ref 98–111)
Creatinine, Ser: 0.83 mg/dL (ref 0.61–1.24)
GFR, Estimated: >60 mL/min (ref >60)
Glucose, Bld: 109 mg/dL — ABNORMAL HIGH (ref 70–99)
Potassium: 4.9 mmol/L (ref 3.5–5.1)
Sodium: 133 mmol/L — ABNORMAL LOW (ref 135–145)

## 2020-06-24 LAB — CBC
HCT: 30.8 % — ABNORMAL LOW (ref 39.0–52.0)
Hemoglobin: 10.1 g/dL — ABNORMAL LOW (ref 13.0–17.0)
MCH: 31.9 pg (ref 26.0–34.0)
MCHC: 32.8 g/dL (ref 30.0–36.0)
MCV: 97.2 fL (ref 80.0–100.0)
Platelets: 534 10*3/uL — ABNORMAL HIGH (ref 150–400)
RBC: 3.17 MIL/uL — ABNORMAL LOW (ref 4.22–5.81)
RDW: 13.4 % (ref 11.5–15.5)
WBC: 11.8 10*3/uL — ABNORMAL HIGH (ref 4.0–10.5)
nRBC: 0 % (ref 0.0–0.2)

## 2020-06-24 MED ORDER — SPIRONOLACTONE 25 MG PO TABS: 12.5000 mg | ORAL_TABLET | Freq: Every day | ORAL | 3 refills | Status: DC

## 2020-06-24 NOTE — Patient Instructions (Signed)
Decrease Spironolactone to 12.5 mg (1/2 tab) Daily  Labs done today, your results will be available in MyChart, we will contact you for abnormal readings.  Dr Curt Bears office will contact you about an appoint either with him or the device clinic  Your physician recommends that you schedule a follow-up appointment in: 6-8 weeks  If you have any questions or concerns before your next appointment please send [place] a message through [place] or call our office at [phone].    TO LEAVE A MESSAGE FOR THE NURSE SELECT OPTION 2, PLEASE LEAVE A MESSAGE INCLUDING: . YOUR NAME . DATE OF BIRTH . CALL BACK NUMBER . REASON FOR CALL**this is important as we prioritize the call backs  [place] AS LONG AS YOU CALL BEFORE 4:00 PM   At the [facility], you and your health needs are our priority. As part of our continuing mission to provide you with exceptional heart care, we have created designated Provider Care Teams. These Care Teams include your primary Cardiologist (physician) and Advanced Practice Providers (APPs- Physician Assistants and Nurse Practitioners) who all work together to provide you with the care you need, when you need it.   You may see any of the following providers on your designated Care Team at your next follow up: Marland Kitchen Dr Glori Bickers . Dr Loralie Champagne . Dr Vickki Muff . Darrick Grinder, NP . Lyda Jester, [place] . Audry Riles, PharmD   Please be sure to bring in all your medications bottles to every appointment.

## 2020-06-24 NOTE — Progress Notes (Signed)
ADVANCED HF CLINIC CONSULT NOTE  Referring Physician: Jenne Mcgee Primary Care: Ian Mcgee Primary Cardiologist: Ian Mcgee  HPI:  IanMorrisonis a22 y.o.malewith PMH CAD, EF 45% and h/o CHB s/p BiV, PAD/Carotid dx 60%, multiple medications intolerances, recently diagnosed with multivessel CAD  (LM50%, ostial LAD50%, mod mid LAD 60%, iFR+, D2 dx 50%, mid 75%, calcific prox RCA 80% dx, anomalous LCX- small and heavily calcified), severe MR (carpentier type IIIb) on recent LHC/RHC 05/28/2020  Followed by Dr. Agustin Mcgee for years for moderate MR, CAD, complete heart block s/p PPM.  Began having decreased exercise capacity in 2019.  At that time had an echo with EF 40% and anterior wall akinesis. Given his symptoms and echo findings he underwent LHC which demonstrated diffuse moderate to severe RCA and LAD disease, severe MR and the thought was that given LHC findings his cardiomyopathy was due to pacemaker syndrome.  Referred to EP for consideration of LV lead placement, and medical therapy was intensified but he had difficulty tolerating much GDMT.    Received BIV pacer on 06/2017.  Remained active playing tennis, did not want to take antiplatelet therapy due to bruising was followed by lipid clinic but could never really tolerate lipid lowering therapy.  By 09/2017 EF up to 50-55%, MR had improved to mild from severe. Similar findings on 2020 ECHO.     05/27/2020 saw his primary cardiologist and discussed he experienced significantly increased shortness of breath very difficult to play tennis, had an episode of PND.  Decision was made to proceed with repeat LHC:     Also had a repeat ECHO 05/2020 with EF 40%, moderate-severe MR, normal RV function, G2DD.     Shortly after LHC returned to the hospital 4/23 for chest pain and dyspnea on exertion while working.  CXR- neg for pulm edema, clear lungs, BP 178/98, HR 82.  Trop 27-> 31. BNP 320 CT surgery consulted and patient  underwent CABG x3 and MVR with maze procedure given history of PAF/ AFL as well as LAA clip.  Did have post op AKI which improved by discharge cr 1.02.  Started on GDMT, intermittently required diuretics, discharge weight 157lbs.    Here for hospital follow up.  Going up and down stairs now, strength improving, walking 3/4 of a mile.  Having trouble sleeping through the night, having to get up and use the bathroom.  Still getting short of breath with exertion but has improved since hospitalization.  Can go at a slow pace and not get short of breath but heart rate goes up.  BP has been low to low normal.  Says about 4-5 times per day gets dizzy and light headed usually checks his bp and systolic is in the [id number] or low 100's.  Feels good when systolic above [id number].  Log shows average bp about 110/78. Weighing himself at home weight between 148 and 149lbs since d/c.  Has not taken as needed lasix at all.  Has had no chest pain.  No PND or orthopnea.       Review of Systems: [y] = yes, [ ]  = no   General: Weight gain [ ] ; Weight loss Blue.Reese ]; Anorexia [ y]; Fatigue [ ] ; Fever [ ] ; Chills [ ] ; Weakness [ ]   Cardiac: Chest pain/pressure [ ] ; Resting SOB [ ] ; Exertional SOB [ y]; Orthopnea [ ] ; Pedal Edema [ ] ; Palpitations [ ] ; Syncope [ ] ; Presyncope [ ] ; Paroxysmal nocturnal dyspnea[ ]   Pulmonary: Cough [ ] ; Wheezing[ ] ;  Hemoptysis[ ] ; Sputum [ ] ; Snoring [ ]   GI: Vomiting[ ] ; Dysphagia[ ] ; Melena[ ] ; Hematochezia [ ] ; Heartburn[ ] ; Abdominal pain [ ] ; Constipation [ ] ; Diarrhea [ ] ; BRBPR [ ]   GU: Hematuria[ ] ; Dysuria [ ] ; Nocturia[ ]   Vascular: Pain in legs with walking [ ] ; Pain in feet with lying flat [ ] ; Non-healing sores [ ] ; Stroke [ ] ; TIA [ ] ; Slurred speech [ ] ;  Neuro: Headaches[ ] ; Vertigo[ ] ; Seizures[ ] ; Paresthesias[ ] ;Blurred vision [ ] ; Diplopia [ ] ; Vision changes [ ]   Ortho/Skin: Arthritis Blue.Reese ]; Joint pain [ ] ; Muscle pain [ ] ; Joint swelling [ ] ; Back Pain [ ] ; Rash [ ]   Psych:  Depression[ y]; Anxiety[ ]   Heme: Bleeding problems [ ] ; Clotting disorders [ ] ; Anemia [ ]   Endocrine: Diabetes [ ] ; Thyroid dysfunction[ ]    Past Medical History:  Diagnosis Date  . Attention deficit disorder (ADD)   . Cardiac pacemaker 05/10/2017  . Cardiomyopathy ([place])   . Complete heart block ([place]) 06/10/2015  . Coronary artery disease    40% left main, 50% LAD, 50% RCA recent cardiac catheterization from March 2019  . Dyslipidemia   . Mitral regurgitation   . Peripheral vascular disease ([place])    Noncritical bilateral carotid arterial disease  . Pneumonia   . S/P CABG x 3 06/10/2020   LIMA to LAD, SVG to Diag, SVG to PDA  . S/P Maze operation for atrial fibrillation 06/10/2020   Left atrial lesion set using bipolar radiofrequency and cryothermy ablation with clipping of LA appendage  . S/P mitral valve repair 06/10/2020   Annuloplasty with a M28 mm Model [id number] [place]  SN: [id number]    . Status post biventricular pacemaker 07/16/2017  . Syncope and collapse     Current Outpatient Medications  Medication Sig Dispense Refill  . aspirin EC 81 MG EC tablet Take 1 tablet (81 mg total) by mouth daily. Swallow whole. 30 tablet 11  . atorvastatin (LIPITOR) 40 MG tablet Take 1 tablet (40 mg total) by mouth daily. 30 tablet 3  . B-12, Methylcobalamin, 1000 MCG SUBL Place 1,000 mcg under the tongue every morning.    . carvedilol (COREG) 3.125 MG tablet Take 1 tablet (3.125 mg total) by mouth 2 (two) times daily with a meal. 60 tablet 3  . furosemide (LASIX) 40 MG tablet Take 40 mg daily for weight gain of 3 lbs overnight or 5 lbs in 1 week. 90 tablet 3  . losartan (COZAAR) 25 MG tablet Take 1 tablet (25 mg total) by mouth daily. 30 tablet 3  . Multiple Vitamin (MULTIVITAMIN ADULT PO) Take 1 tablet by mouth daily in the afternoon.    . nitroGLYCERIN (NITROSTAT) 0.4 MG SL tablet Place 1 tablet (0.4 mg total) under the tongue every 5 (five) minutes x 3 doses as needed for  chest pain. 25 tablet 3  . Probiotic Product (PROBIOTIC PO) Take 1 tablet by mouth every morning.    . psyllium (METAMUCIL) 58.6 % packet Take 1 packet by mouth every morning.    Marland Kitchen spironolactone (ALDACTONE) 25 MG tablet Take 1 tablet (25 mg total) by mouth daily. 30 tablet 3  . warfarin (COUMADIN) 5 MG tablet Take 1 tablet (5 mg total) by mouth daily at 4 PM. 30 tablet 3   No current facility-administered medications for this encounter.    Allergies  Allergen Reactions  . Ciprofloxacin Other (See Comments)    Bleeding (intolerance)  .  Crestor [Rosuvastatin Calcium] Other (See Comments)    Muscle twinges  . Nexletol [Bempedoic Acid] Other (See Comments)    Muscle and joint pain      Social History   Socioeconomic History  . Marital status: Married    Spouse name: Not on file  . Number of children: Not on file  . Years of education: Not on file  . Highest education level: Not on file  Occupational History  . Occupation: Engineer, drilling  Tobacco Use  . Smoking status: Former Smoker    Types: Cigarettes  . Smokeless tobacco: Never Used  Vaping Use  . Vaping Use: Never used  Substance and Sexual Activity  . Alcohol use: Yes    Comment: Wine with dinner  . Drug use: Never  . Sexual activity: Not on file  Other Topics Concern  . Not on file  Social History Narrative  . Not on file   Social Determinants of Health   Financial Resource Strain: Not on file  Food Insecurity: Not on file  Transportation Needs: Not on file  Physical Activity: Not on file  Stress: Not on file  Social Connections: Not on file  Intimate Partner Violence: Not on file      Family History  Problem Relation Age of Onset  . CAD Mother   . Hypertension Mother   . Osteoporosis Mother   . CAD Father   . Leukemia Father   . Ovarian cancer Sister   . Healthy Brother   . CAD Brother   . Rheumatic fever Brother     Vitals:   06/24/20 0910  BP: 100/60  Pulse: 98  SpO2: 100%   Weight: 68.3 kg (150 lb 9.6 oz)    PHYSICAL EXAM: General:  Well appearing. No respiratory difficulty HEENT: normal Neck: supple. no JVD. Carotids 2+ bilat; no bruits. No lymphadenopathy or thryomegaly appreciated. Cor: PMI nondisplaced. Regular rate & rhythm. No rubs, gallops or murmurs. Lungs: clear Abdomen: soft, nontender, nondistended. No hepatosplenomegaly. No bruits or masses. Good bowel sounds. Extremities: no cyanosis, clubbing, rash, edema Neuro: alert & oriented x 3, cranial nerves grossly intact. moves all 4 extremities w/o difficulty. Affect pleasant.  ECG:  Atrial sensed BIV paced rate of 98, QRS duration 166   ASSESSMENT & PLAN: 1. Chronic systolic HF: -Patient has history of nonischemic cardiomyopathy with LVEF 35-40% 2019. S/p BiV PPM placement with improvement in EF . Now likely an element of ischemic cardiomyopathy given progression of CAD -ECHO 05/2020 with EF 40%, moderate-severe MR, normal RV function, G2DD. -LHC 05/2020 with multivessel disease now s/p CABG x3 and MVR with maze procedure -06/16/20 ECHO EF 25-30%  -Currently NYHA class II-III symptoms, dry on examination -Continue coreg 3.125mg  BID, losartan 25mg  QHS, PRN lasix 40mg   -BP low with symptoms and dry on exam REDS 27%, we will decrease spiro to 12.5mg  and liberalize diet/fluid intake -HR up and QRS duration 166 today, also ecg's last month with significantly wide QRS will refer back to EP for evaluation -may be able to add [id number] later but too dry currently  2. Severe MR s/p MV Repair: -Intraop TEE with moderate MR.   -No residual MR on post repair TEE -On warfarin recent dose increase subtherapeutic no bleeding  3. Multivessel CAD s/p CABG: -s/p 3v CABG with Dr. Roxy Manns 06/10/20 -Continue ASA 81mg , warfarin and atorvastatin 40mg  daily -Continue coreg 3.125mg  BID  4. Paroxysmal Afib/flutter: -S/p MAZE procedure with LAA clipping. - In NSR today -Continue coreg -Continued on warfarin  5.  History of CHB: -S/p BiV PPM placement -Interrogated today 98% Atrial sense BIV paced -HR up and QRS duration 166 today, also ecg's last month with significantly wide QRS will refer back to EP for evaluation  Vickki Muff, MD   Patient seen and examined with the above-signed Advanced Practice Provider and/or Housestaff. I personally reviewed laboratory data, imaging studies and relevant notes. I independently examined the patient and formulated the important aspects of the plan. I have edited the note to reflect any of my changes or salient points. I have personally discussed the plan with the patient and/or family.  Recovering slowly after CABG. Remains somewhat fatigued and orthostatic. SBP 90-110. REDS 27%.  General:  Thin Well appearing. No resp difficulty HEENT: normal Neck: supple. no JVD. Carotids 2+ bilat; no bruits. No lymphadenopathy or thryomegaly appreciated. Cor: sternal wound healing well. PMI nondisplaced. Regular rate & rhythm. No rubs, gallops or murmurs. Lungs: clear Abdomen: soft, nontender, nondistended. No hepatosplenomegaly. No bruits or masses. Good bowel sounds. Extremities: no cyanosis, clubbing, rash, edema Neuro: alert & orientedx3, cranial nerves grossly intact. moves all 4 extremities w/o difficulty. Affect pleasant  Progressing well post CABG/MVR. NYHA II-III. Likely a bit dry. Cut spiro to 12.5. Liberalize fluid. Continue CR. Qrs is still wide. Refer back to EP to see if they can optimize CRT. ICD interrogated personally in clinic.   Glori Bickers, MD  12:10 PM

## 2020-06-24 NOTE — Addendum Note (Signed)
Encounter addended by: Jolaine Artist, MD on: 06/24/2020 12:11 PM  Actions taken: Level of Service modified, Visit diagnoses modified, Charge Capture section accepted

## 2020-06-24 NOTE — Progress Notes (Signed)
ReDS Vest / Clip - 06/24/20 0910      ReDS Vest / Clip   Station Marker C    Ruler Value 26.5    ReDS Value Range Low volume    ReDS Actual Value 27

## 2020-06-29 ENCOUNTER — Other Ambulatory Visit: Payer: Self-pay

## 2020-06-29 ENCOUNTER — Other Ambulatory Visit: Payer: Self-pay | Admitting: *Deleted

## 2020-06-29 ENCOUNTER — Ambulatory Visit (INDEPENDENT_AMBULATORY_CARE_PROVIDER_SITE_OTHER): Payer: Medicare Other

## 2020-06-29 ENCOUNTER — Encounter: Payer: Self-pay | Admitting: *Deleted

## 2020-06-29 DIAGNOSIS — Z7901 Long term (current) use of anticoagulants: Secondary | ICD-10-CM | POA: Diagnosis not present

## 2020-06-29 DIAGNOSIS — Z9889 Other specified postprocedural states: Secondary | ICD-10-CM

## 2020-06-29 DIAGNOSIS — I340 Nonrheumatic mitral (valve) insufficiency: Secondary | ICD-10-CM | POA: Diagnosis not present

## 2020-06-29 DIAGNOSIS — I509 Heart failure, unspecified: Secondary | ICD-10-CM | POA: Diagnosis not present

## 2020-06-29 DIAGNOSIS — Z951 Presence of aortocoronary bypass graft: Secondary | ICD-10-CM | POA: Diagnosis not present

## 2020-06-29 DIAGNOSIS — Z952 Presence of prosthetic heart valve: Secondary | ICD-10-CM | POA: Diagnosis not present

## 2020-06-29 DIAGNOSIS — I779 Disorder of arteries and arterioles, unspecified: Secondary | ICD-10-CM | POA: Diagnosis not present

## 2020-06-29 LAB — POCT INR: INR: 1.3 — AB (ref 2.0–3.0)

## 2020-06-29 NOTE — Patient Outreach (Signed)
[facility]) Care Management  06/29/2020  Ian Mcgee [birth year] [id number]   Telephone Assessment-Enrolled-Other (recent hospitalization for CABG (MAZE procedure)  Outreach #2 RN spoke with pt today and introduced [facility] services and the purpose for today's call. Pt reports he has already visited his primary provider since his recent discharge. Pt declined review of his medications indicating they were reviewed several times already. Pt verified sufficient transportation source to all his provider visits and has hired temporary help in the home as he continues to recover. Offered enrollment into the [facility] program for care management services with ongoing communication with his provider Dr. Humphrey Rolls. Pt receptive however reluctantly to provide all information to completed the assessment. Pt declined to provide any additional information to completed the assessment but again willing to participate with certain limitations.   Generated and discussed the plan of care related to pt's recent discharged for appointments, quality of life improvement and CAD for improving his overall health for cardiovascular due to his recent CABG. Pt receptive at this time and will follow through with all discussed. RN addressed any inquires or concerns at this time. Pt aware RN will communicate with his primary provider concerning his disposition with [facility] services.  Goals Addressed            This Visit's Progress   . [place] My Quality of Life       Timeframe:  Long-Range Goal Priority:  Medium Start Date: 06/29/2020                            Expected End Date:  10/13/2020                    Follow Up Date 07/30/2020   - do one enjoyable thing every day - spend time with a child every day, borrow one if I have to - spend time outdoors at least 3 times a week   Barriers: Health Behaviors  Why is this important?    Having a long-term illness can be scary.   It can also be stressful for  you and your caregiver.   These steps may help.    Notes:  5/17 Pt has supportive family to assist in the home and has hired help to assist temporary as he continue to recover. Pt continue to follow the post-op discharge instructions and decline review of his medications at this time indicating they have been reviewed several times. Will continue to encourage adherence with his ongoing management of care.     Ian Mcgee and Keep All Appointments       Timeframe:  Short-Term Goal Priority:  Medium Start Date:   06/29/2020                          Expected End Date:   08/12/2020                    Follow Up Date 07/30/2020   - arrange a ride through an agency 1 week before appointment - ask family or friend for a ride - call to cancel if needed - keep a calendar with prescription refill dates - keep a calendar with appointment dates   Barriers: Health Behaviors  Why is this important?    Part of staying healthy is seeing the doctor for follow-up care.   If you forget your appointments, there are some things you  can do to stay on track.    Notes:  5/17-Pt was able to verify sufficient transportation and aware of all appointments. Pt has verified an office visit to his primary provider.        Ian Mina, RN Care Management Coordinator [place] Office [phone]

## 2020-06-29 NOTE — Patient Instructions (Signed)
Take 2 tablets today only and then increase to 1 tablet Daily except 1.5 tablets on Monday, Wednesday and Friday.  Repeat INR 1 week.  [phone]

## 2020-06-30 ENCOUNTER — Ambulatory Visit (INDEPENDENT_AMBULATORY_CARE_PROVIDER_SITE_OTHER): Payer: Self-pay | Admitting: *Deleted

## 2020-06-30 DIAGNOSIS — I779 Disorder of arteries and arterioles, unspecified: Secondary | ICD-10-CM | POA: Diagnosis not present

## 2020-06-30 DIAGNOSIS — I509 Heart failure, unspecified: Secondary | ICD-10-CM | POA: Diagnosis not present

## 2020-06-30 DIAGNOSIS — I340 Nonrheumatic mitral (valve) insufficiency: Secondary | ICD-10-CM | POA: Diagnosis not present

## 2020-06-30 DIAGNOSIS — Z4802 Encounter for removal of sutures: Secondary | ICD-10-CM

## 2020-06-30 DIAGNOSIS — Z952 Presence of prosthetic heart valve: Secondary | ICD-10-CM | POA: Diagnosis not present

## 2020-06-30 DIAGNOSIS — Z951 Presence of aortocoronary bypass graft: Secondary | ICD-10-CM | POA: Diagnosis not present

## 2020-06-30 NOTE — Progress Notes (Signed)
Patient arrived for nurse visit to remove sutures post-CABG 4/28 by Dr. Roxy Manns. Four sutures removed with no signs or symptoms of infection noted.  Incisions well approximated.  Scab formation over left two inscions. Patient tolerated suture removal well.  Patient and family instructed to keep the incision site clean and dry. Patient and family acknowledged instructions given.    Patient with concerns over not being able to sleep at night, lump at superior margin of sternal incision, and driving. Advised patient he may try over the counter sleep aids such as benadryl, melatonin, or Z-Quill to aid in sleep. Patient told the lump is something we routinely see. Denies tenderness or issues at site. Advised it should go down with time. Advised patient to wait to drive until he is seen in f/u next week to allow sternal healing. All questions answered. Patient verbalized understanding.

## 2020-07-01 ENCOUNTER — Other Ambulatory Visit: Payer: Self-pay

## 2020-07-01 ENCOUNTER — Encounter: Payer: Self-pay | Admitting: Cardiology

## 2020-07-01 ENCOUNTER — Ambulatory Visit (INDEPENDENT_AMBULATORY_CARE_PROVIDER_SITE_OTHER): Payer: Medicare Other | Admitting: Cardiology

## 2020-07-01 VITALS — BP 110/70 | HR 102 | Ht 71.0 in | Wt 152.0 lb

## 2020-07-01 DIAGNOSIS — Z8679 Personal history of other diseases of the circulatory system: Secondary | ICD-10-CM | POA: Diagnosis not present

## 2020-07-01 DIAGNOSIS — Z951 Presence of aortocoronary bypass graft: Secondary | ICD-10-CM

## 2020-07-01 DIAGNOSIS — Z9889 Other specified postprocedural states: Secondary | ICD-10-CM | POA: Diagnosis not present

## 2020-07-01 DIAGNOSIS — Z950 Presence of cardiac pacemaker: Secondary | ICD-10-CM

## 2020-07-01 DIAGNOSIS — I5022 Chronic systolic (congestive) heart failure: Secondary | ICD-10-CM

## 2020-07-01 DIAGNOSIS — I739 Peripheral vascular disease, unspecified: Secondary | ICD-10-CM | POA: Diagnosis not present

## 2020-07-01 DIAGNOSIS — R0602 Shortness of breath: Secondary | ICD-10-CM

## 2020-07-01 NOTE — Patient Instructions (Signed)
Medication Instructions:  Your physician recommends that you continue on your current medications as directed. Please refer to the Current Medication list given to you today.  *If you need a refill on your cardiac medications before your next appointment, please call your pharmacy*   Lab Work: None If you have labs (blood work) drawn today and your tests are completely normal, you will receive your results only by: MyChart Message (if you have MyChart) OR A paper copy in the mail If you have any lab test that is abnormal or we need to change your treatment, we will call you to review the results.   Testing/Procedures: None   Follow-Up: At [facility], you and your health needs are our priority.  As part of our continuing mission to provide you with exceptional heart care, we have created designated Provider Care Teams.  These Care Teams include your primary Cardiologist (physician) and Advanced Practice Providers (APPs -  Physician Assistants and Nurse Practitioners) who all work together to provide you with the care you need, when you need it.  We recommend signing up for the patient portal called "MyChart".  Sign up information is provided on this After Visit Summary.  MyChart is used to connect with patients for Virtual Visits (Telemedicine).  Patients are able to view lab/test results, encounter notes, upcoming appointments, etc.  Non-urgent messages can be sent to your provider as well.   To learn more about what you can do with MyChart, go to [email or web address].    Your next appointment:   1 month(s)  The format for your next appointment:   In Person  Provider:   Kendry Krasowski, MD   Other Instructions   

## 2020-07-01 NOTE — Progress Notes (Signed)
Cardiology Office Note:    Date:  07/01/2020   ID:  Ian Mcgee, DOB 1945-07-15, MRN [id number]  PCP:  Mateo Flow, MD  Cardiologist:  Jenne Campus, MD    Referring MD: Mateo Flow, MD   Chief Complaint  Patient presents with  . Follow-up  I am recovering after my open heart surgery  History of Present Illness:    Ian Mcgee is a 75 y.o. male with past medical history significant for coronary artery disease, cardiomyopathy, peripheral vascular disease.  I been following for years and recently he developed diminished left ventricle ejection fraction on top of that he started having some more symptoms and up having cardiac catheterization done which showed significant right coronary artery lesion as well as significant LAD and diagonal branch lesion.  He was also found to have severe mitral regurgitation.  Eventually decision has been made to pursue surgical intervention.  His past medical history is also significant for BiV pacing initially he got DDD pacemaker but then developed cardiomyopathy in his system was upgraded to BiV pacing that happened in 2019.  After that he showed improvement left ventricle ejection fraction, however then later his ejection fraction deteriorated which we felt was related to ischemia.  On 06/02/2020 he underwent triple vessel bypass graft with LIMA to LAD, SVG to posterior descending artery, SVG to diagonal branch.  He also got mitral valve ring placed which is 28 mm Edwards ring, Maze procedure was done at that time and clipping of left atrial appendage was performed.  He is post surgical recovery time was complicated by acute renal failure which was less likely ATN, he recovered completely from that.  He has been discharged home few weeks ago and he progressing quite nicely during the recovery.  He started exercising on the regular basis and he tells me he try to walk 1 to 2 miles every single day he said gradually his trend is coming back.   Shortness of breath is still there but overall improved.  Denies have any palpitations.  There is no dizziness.  A few problems that he want to discuss with me leading problem is the fact that he have difficulty sleeping at night.  He wakes up maybe once or twice at night and then he cannot sleep.  He always had some difficulty sleeping will manage this appropriately but now it is really becoming problematic.  He also got some concern about his upper sternum which have to be bulging a little bit but examination of this area did not reveal an acute problem and he said it was that way since the time of surgery.  Denies have any palpitations.  Swelling of lower extremities is gone.  Past Medical History:  Diagnosis Date  . Acute on chronic systolic heart failure ([place])   . Attention deficit disorder (ADD)   . Cardiac pacemaker 05/10/2017  . Cardiomyopathy ([place])   . Chest pain 06/05/2020  . Chronic apical periodontitis   . Complete heart block ([place]) 06/10/2015  . Coronary artery disease    40% left main, 50% LAD, 50% RCA recent cardiac catheterization from March 2019  . Dyslipidemia   . Encounter for preoperative dental examination   . Essential hypertension   . Gingival recession, generalized   . Gingivitis   . Hyperlipidemia   . Long term (current) use of anticoagulants 06/22/2020  . Mitral regurgitation   . NSTEMI (non-ST elevated myocardial infarction) ([place]) 06/06/2020  . Pacemaker reprogramming/check 06/10/2015  .  Peripheral vascular disease ([place])    Noncritical bilateral carotid arterial disease  . Pneumonia   . S/P CABG x 3 06/10/2020   LIMA to LAD, SVG to Diag, SVG to PDA  . S/P Maze operation for atrial fibrillation 06/10/2020   Left atrial lesion set using bipolar radiofrequency and cryothermy ablation with clipping of LA appendage  . S/P mitral valve repair 06/10/2020   Annuloplasty with a M28 mm Model [address] [place]  SN: [id number]    . Status post biventricular  pacemaker 07/16/2017  . Syncope and collapse   . Teeth missing     Past Surgical History:  Procedure Laterality Date  . Basel Cell Cancer    . BIV UPGRADE N/A 06/25/2017   Procedure: BIV UPGRADE;  Surgeon: Constance Haw, MD;  Location: [place] [facility] LAB;  Service: Cardiovascular;  Laterality: N/A;  . BUBBLE STUDY  06/03/2020   Procedure: BUBBLE STUDY;  Surgeon: [name], MD;  Location: [facility] ENDOSCOPY;  Service: Cardiology;;  . CATARACT EXTRACTION    . CLIPPING OF ATRIAL APPENDAGE  06/10/2020   Procedure: CLIPPING OF ATRIAL APPENDAGE USING  ATRICURE PRO2 CLIP SIZE 45MM;  Surgeon: Rexene Alberts, MD;  Location: [facility] OR;  Service: Open Heart Surgery;;  . CORONARY ARTERY BYPASS GRAFT N/A 06/10/2020   Procedure: CORONARY ARTERY BYPASS GRAFTING (CABG) X THREE USING LEFT INTERNAL MAMMARY ARTERY AND BILATERAL LEGS GREATER SAPHENOUS VEIN HARVESTED ENDOSCOPICALLY;  Surgeon: Rexene Alberts, MD;  Location: [place];  Service: Open Heart Surgery;  Laterality: N/A;  . INSERT / REPLACE / REMOVE PACEMAKER    . INTRAVASCULAR PRESSURE WIRE/FFR STUDY N/A 05/28/2020   Procedure: INTRAVASCULAR PRESSURE WIRE/FFR STUDY;  Surgeon: [name], MD;  Location: [place] [facility] LAB;  Service: Cardiovascular;  Laterality: N/A;  . KNEE SURGERY Right    ACL Reconstruction  . LEFT HEART CATH AND CORONARY ANGIOGRAPHY N/A 05/10/2017   Procedure: LEFT HEART CATH AND CORONARY ANGIOGRAPHY;  Surgeon: [name], MD;  Location: [place] [facility] LAB;  Service: Cardiovascular;  Laterality: N/A;  . MAZE N/A 06/10/2020   Procedure: MAZE;  Surgeon: Rexene Alberts, MD;  Location: [place];  Service: Open Heart Surgery;  Laterality: N/A;  . MITRAL VALVE REPAIR N/A 06/10/2020   Procedure: MITRAL VALVE REPAIR USING CARPENTIER MCCARTHY-ADAMS RING SIZE 28MM;  Surgeon: Rexene Alberts, MD;  Location: [place];  Service: Open Heart Surgery;  Laterality: N/A;  . RIGHT/LEFT HEART CATH AND CORONARY ANGIOGRAPHY N/A 05/28/2020    Procedure: RIGHT/LEFT HEART CATH AND CORONARY ANGIOGRAPHY;  Surgeon: [name], MD;  Location: [place] [facility];  Service: Cardiovascular;  Laterality: N/A;  . TEE WITHOUT CARDIOVERSION N/A 06/03/2020   Procedure: TRANSESOPHAGEAL ECHOCARDIOGRAM (TEE);  Surgeon: [name], MD;  Location: [place];  Service: Cardiology;  Laterality: N/A;  . TEE WITHOUT CARDIOVERSION N/A 06/10/2020   Procedure: TRANSESOPHAGEAL ECHOCARDIOGRAM (TEE);  Surgeon: Rexene Alberts, MD;  Location: [place];  Service: Open Heart Surgery;  Laterality: N/A;  . VASECTOMY      Current Medications: Current Meds  Medication Sig  . aspirin EC 81 MG EC tablet Take 1 tablet (81 mg total) by mouth daily. Swallow whole.  Marland Kitchen atorvastatin (LIPITOR) 40 MG tablet Take 1 tablet (40 mg total) by mouth daily.  . B-12, Methylcobalamin, 1000 MCG SUBL Place 1,000 mcg under the tongue every morning.  . carvedilol (COREG) 3.125 MG tablet Take 1 tablet (3.125 mg total) by mouth 2 (two) times daily  with a meal.  . furosemide (LASIX) 40 MG tablet Take 40 mg daily for weight gain of 3 lbs overnight or 5 lbs in 1 week.  . losartan (COZAAR) 25 MG tablet Take 1 tablet (25 mg total) by mouth daily.  . Multiple Vitamin (MULTIVITAMIN ADULT PO) Take 1 tablet by mouth daily in the afternoon.  . nitroGLYCERIN (NITROSTAT) 0.4 MG SL tablet Place 1 tablet (0.4 mg total) under the tongue every 5 (five) minutes x 3 doses as needed for chest pain.  . Probiotic Product (PROBIOTIC PO) Take 1 tablet by mouth every morning.  . psyllium (METAMUCIL) 58.6 % packet Take 1 packet by mouth every morning.  Marland Kitchen spironolactone (ALDACTONE) 25 MG tablet Take 0.5 tablets (12.5 mg total) by mouth daily.  Marland Kitchen warfarin (COUMADIN) 5 MG tablet Take 1 tablet (5 mg total) by mouth daily at 4 PM.     Allergies:   Ciprofloxacin, Crestor [rosuvastatin calcium], and Nexletol [bempedoic acid]   Social History   Socioeconomic History  . Marital status: Married    Spouse  name: Not on file  . Number of children: Not on file  . Years of education: Not on file  . Highest education level: Not on file  Occupational History  . Occupation: Engineer, drilling  Tobacco Use  . Smoking status: Former Smoker    Types: Cigarettes  . Smokeless tobacco: Never Used  Vaping Use  . Vaping Use: Never used  Substance and Sexual Activity  . Alcohol use: Yes    Comment: Wine with dinner  . Drug use: Never  . Sexual activity: Not on file  Other Topics Concern  . Not on file  Social History Narrative  . Not on file   Social Determinants of Health   Financial Resource Strain: Not on file  Food Insecurity: No Food Insecurity  . Worried About Charity fundraiser in the Last Year: Never true  . Ran Out of Food in the Last Year: Never true  Transportation Needs: No Transportation Needs  . Lack of Transportation (Medical): No  . Lack of Transportation (Non-Medical): No  Physical Activity: Not on file  Stress: Not on file  Social Connections: Not on file     Family History: The patient's family history includes CAD in his brother, father, and mother; Healthy in his brother; Hypertension in his mother; Leukemia in his father; Osteoporosis in his mother; Ovarian cancer in his sister; Rheumatic fever in his brother. ROS:   Please see the history of present illness.    All 14 point review of systems negative except as described per history of present illness  EKGs/Labs/Other Studies Reviewed:      Recent Labs: 05/27/2020: NT-Pro BNP 1,028 06/09/2020: ALT 20 06/15/2020: B Natriuretic Peptide 492.8; Magnesium 2.2 06/24/2020: BUN 15; Creatinine, Ser 0.83; Hemoglobin 10.1; Platelets 534; Potassium 4.9; Sodium 133  Recent Lipid Panel    Component Value Date/Time   CHOL 196 05/29/2020 0100   TRIG 92 05/29/2020 0100   HDL 71 05/29/2020 0100   CHOLHDL 2.8 05/29/2020 0100   VLDL 18 05/29/2020 0100   LDLCALC 107 (H) 05/29/2020 0100    Physical Exam:    VS:  BP  110/70 (BP Location: Right Arm, Patient Position: Sitting, Cuff Size: Normal)   Pulse (!) 102   Ht 5\' 11"  (1.803 m)   Wt 152 lb (68.9 kg)   SpO2 97%   BMI 21.20 kg/m     Wt Readings from Last 3 Encounters:  07/01/20 152  lb (68.9 kg)  06/24/20 150 lb 9.6 oz (68.3 kg)  06/17/20 157 lb 13.6 oz (71.6 kg)     GEN:  Well nourished, well developed in no acute distress HEENT: Normal NECK: No JVD; No carotid bruits LYMPHATICS: No lymphadenopathy CARDIAC: RRR, no murmurs, no rubs, no gallops RESPIRATORY:  Clear to auscultation without rales, wheezing or rhonchi  ABDOMEN: Soft, non-tender, non-distended MUSCULOSKELETAL:  No edema; No deformity  SKIN: Warm and dry LOWER EXTREMITIES: no swelling NEUROLOGIC:  Alert and oriented x 3 PSYCHIATRIC:  Normal affect   ASSESSMENT:    1. S/P CABG x 3   2. S/P Maze operation for atrial fibrillation   3. S/P mitral valve repair + CABG x3 + maze procedure   4. Status post biventricular pacemaker   5. Peripheral vascular disease ([place])    PLAN:    In order of problems listed above:  1. Status post coronary bypass graft.  Recovering nicely we will continue present medications.  He is on aspirin which I will continue for now. 2. Status post maze procedure for paroxysmal atrial fibrillation he is maintaining sinus rhythm.  We will continue anticoagulation with Coumadin for now. 3. Status post mitral valve ring placement.  Auscultation of the chest did not reveal any murmur.  Looks like echocardiogram after surgery did not show any significant MR.  He was very good repair. 4. Status post fine biventricular pacemaker.  Apparently QRS complexes still quite wide.  He is scheduled to see EP team for potential programming changes that hopefully with improve synchronization. 5. Peripheral vascular disease stable.  Now he is taking aspirin he is also on statin which make me very happy will continue. 6. Dyslipidemia is being always a problem he had difficulty  tolerating medication now he is taking Lipitor 40 and seems to be tolerating quite well we will continue that management. 7. Overall I consider his recovery quite good.  He is started doing a lot of exercises.  I have to actually slow him down a little bit with this.  I told him walking every day is a good idea.  He will take some melatonin for problem with sleeping he was told that it can mess up his Coumadin therefore he will initiate about 3 to 4 days before his Coumadin check.  I also told him that melatonin take some time to work really 8. Cardiomyopathy his ejection fraction was mildly diminished.  He looks compensated today not fluid overloaded.  He is able to tolerate small dose of beta-blocker as well as Cozaar.  Biggest difficulty is his blood pressure being low.  Previously had difficulty ambulating medications.  He did have difficulty tolerating Entresto prior.  I am happy that he is able to take beta-blocker right now. 9. Paroxysmal atrial fibrillation, status post maze procedure continue anticoagulation.  Maintain sinus rhythm   Medication Adjustments/Labs and Tests Ordered: Current medicines are reviewed at length with the patient today.  Concerns regarding medicines are outlined above.  No orders of the defined types were placed in this encounter.  Medication changes: No orders of the defined types were placed in this encounter.   Signed, Park Liter, MD, [facility] 07/01/2020 11:58 AM    [place]

## 2020-07-02 ENCOUNTER — Other Ambulatory Visit: Payer: Self-pay | Admitting: Thoracic Surgery (Cardiothoracic Vascular Surgery)

## 2020-07-02 DIAGNOSIS — Z9889 Other specified postprocedural states: Secondary | ICD-10-CM | POA: Diagnosis not present

## 2020-07-02 DIAGNOSIS — Z950 Presence of cardiac pacemaker: Secondary | ICD-10-CM | POA: Diagnosis not present

## 2020-07-02 DIAGNOSIS — Z951 Presence of aortocoronary bypass graft: Secondary | ICD-10-CM | POA: Diagnosis not present

## 2020-07-02 DIAGNOSIS — I779 Disorder of arteries and arterioles, unspecified: Secondary | ICD-10-CM | POA: Diagnosis not present

## 2020-07-02 DIAGNOSIS — I509 Heart failure, unspecified: Secondary | ICD-10-CM | POA: Diagnosis not present

## 2020-07-02 DIAGNOSIS — Z8679 Personal history of other diseases of the circulatory system: Secondary | ICD-10-CM | POA: Diagnosis not present

## 2020-07-02 DIAGNOSIS — Z952 Presence of prosthetic heart valve: Secondary | ICD-10-CM | POA: Diagnosis not present

## 2020-07-02 DIAGNOSIS — R0602 Shortness of breath: Secondary | ICD-10-CM | POA: Diagnosis not present

## 2020-07-02 DIAGNOSIS — I739 Peripheral vascular disease, unspecified: Secondary | ICD-10-CM | POA: Diagnosis not present

## 2020-07-02 DIAGNOSIS — I340 Nonrheumatic mitral (valve) insufficiency: Secondary | ICD-10-CM | POA: Diagnosis not present

## 2020-07-02 NOTE — Addendum Note (Signed)
Addended by: Resa Miner I on: 07/02/2020 10:37 AM   Modules accepted: Orders

## 2020-07-02 NOTE — Addendum Note (Signed)
Addended by: Resa Miner I on: 07/02/2020 11:15 AM   Modules accepted: Orders

## 2020-07-03 LAB — BASIC METABOLIC PANEL
BUN/Creatinine Ratio: 23 (ref 10–24)
BUN: 18 mg/dL (ref 8–27)
CO2: 22 mmol/L (ref 20–29)
Calcium: 9.1 mg/dL (ref 8.6–10.2)
Chloride: 99 mmol/L (ref 96–106)
Creatinine, Ser: 0.77 mg/dL (ref 0.76–1.27)
Glucose: 82 mg/dL (ref 65–99)
Potassium: 4.5 mmol/L (ref 3.5–5.2)
Sodium: 134 mmol/L (ref 134–144)
eGFR: 94 mL/min/{1.73_m2} (ref >59)

## 2020-07-03 LAB — CBC
Hematocrit: 30.6 % — ABNORMAL LOW (ref 37.5–51.0)
Hemoglobin: 10.1 g/dL — ABNORMAL LOW (ref 13.0–17.7)
MCH: 31.3 pg (ref 26.6–33.0)
MCHC: 33 g/dL (ref 31.5–35.7)
MCV: 95 fL (ref 79–97)
Platelets: 383 10*3/uL (ref 150–450)
RBC: 3.23 x10E6/uL — ABNORMAL LOW (ref 4.14–5.80)
RDW: 12.4 % (ref 11.6–15.4)
WBC: 7.2 10*3/uL (ref 3.4–10.8)

## 2020-07-03 LAB — PRO B NATRIURETIC PEPTIDE: NT-Pro BNP: 3418 pg/mL — ABNORMAL HIGH (ref 0–376)

## 2020-07-05 ENCOUNTER — Ambulatory Visit
Admission: RE | Admit: 2020-07-05 | Discharge: 2020-07-05 | Disposition: A | Discharge status: Home / Self Care | Payer: Medicare Other | Source: Ambulatory Visit | Attending: Thoracic Surgery (Cardiothoracic Vascular Surgery) | Admitting: Thoracic Surgery (Cardiothoracic Vascular Surgery)

## 2020-07-05 ENCOUNTER — Encounter: Payer: Self-pay | Admitting: Thoracic Surgery (Cardiothoracic Vascular Surgery)

## 2020-07-05 ENCOUNTER — Other Ambulatory Visit: Payer: Self-pay

## 2020-07-05 ENCOUNTER — Ambulatory Visit (INDEPENDENT_AMBULATORY_CARE_PROVIDER_SITE_OTHER): Payer: Self-pay | Admitting: Thoracic Surgery (Cardiothoracic Vascular Surgery)

## 2020-07-05 VITALS — BP 134/84 | HR 71 | Resp 20 | Ht 71.0 in | Wt 146.0 lb

## 2020-07-05 DIAGNOSIS — I340 Nonrheumatic mitral (valve) insufficiency: Secondary | ICD-10-CM | POA: Diagnosis not present

## 2020-07-05 DIAGNOSIS — Z950 Presence of cardiac pacemaker: Secondary | ICD-10-CM | POA: Diagnosis not present

## 2020-07-05 DIAGNOSIS — Z8679 Personal history of other diseases of the circulatory system: Secondary | ICD-10-CM | POA: Diagnosis not present

## 2020-07-05 DIAGNOSIS — I5022 Chronic systolic (congestive) heart failure: Secondary | ICD-10-CM | POA: Diagnosis not present

## 2020-07-05 DIAGNOSIS — Z952 Presence of prosthetic heart valve: Secondary | ICD-10-CM | POA: Diagnosis not present

## 2020-07-05 DIAGNOSIS — Z951 Presence of aortocoronary bypass graft: Secondary | ICD-10-CM | POA: Diagnosis not present

## 2020-07-05 DIAGNOSIS — Z9889 Other specified postprocedural states: Secondary | ICD-10-CM

## 2020-07-05 DIAGNOSIS — R0602 Shortness of breath: Secondary | ICD-10-CM | POA: Diagnosis not present

## 2020-07-05 DIAGNOSIS — I739 Peripheral vascular disease, unspecified: Secondary | ICD-10-CM | POA: Diagnosis not present

## 2020-07-05 DIAGNOSIS — J90 Pleural effusion, not elsewhere classified: Secondary | ICD-10-CM | POA: Diagnosis not present

## 2020-07-05 DIAGNOSIS — I779 Disorder of arteries and arterioles, unspecified: Secondary | ICD-10-CM | POA: Diagnosis not present

## 2020-07-05 DIAGNOSIS — I509 Heart failure, unspecified: Secondary | ICD-10-CM | POA: Diagnosis not present

## 2020-07-05 NOTE — Progress Notes (Signed)
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 Agustin Cree for complete heart block status post permanent pacemaker placement, dilated nonischemic cardiomyopathy, status post biventricular pacemaker upgrade, coronary artery disease, mitral regurgitation, and cerebrovascular disease who returns to the office today for routine follow-up status post coronary artery bypass grafting x3, mitral valve annuloplasty, and Maze procedure on June 10, 2020 for severe multivessel coronary artery disease with severe ischemic mitral regurgitation and recurrent persistent atrial fibrillation.  The patient's early postoperative recovery was notable for acute kidney injury which quickly returned to preoperative baseline.  Patient otherwise made steady progress and had guideline directed medical therapy for acute on chronic combined systolic and diastolic congestive heart failure managed by the advanced heart failure team.  Prior to hospital discharge follow-up echocardiogram was performed demonstrating ejection fraction 25 to 30% with diffuse global hypokinesis.  Mitral valve repair remained intact with no residual mitral regurgitation and mean transvalvular gradient estimated 3 mmHg.  Right ventricular systolic function appeared normal.  No other significant abnormalities were noted.  Since hospital discharge the patient has been seen in follow-up by Dr. Haroldine Laws and more recently by Dr. Agustin Cree on June 01, 2020.  He is anticoagulated using warfarin.  Last INR remained subtherapeutic but Coumadin dose was  increased.  Follow-up blood work obtained Jul 02, 2020 revealed normal creatinine but elevated proBNP level.  The patient was restarted on oral Lasix at that time.  Patient returns to our office today and reports that he had been doing exceptionally well until recently.  He states that over the last week or 2 he has had spells when his heart rate is elevated around 100 to 110 bpm.  Normally is running in the 50s.  When his heart rate in the 50s he feels fine but when it is elevated he has felt poorly and had a tendency to get tired and short of breath.  Prior to that the patient was doing exceptionally well, walking at a brisk pace between 1 and 2 miles every day.  He states that he has had a couple of mild dizzy spells but no chest pain or chest tightness.  He is not having resting shortness of breath.  Lasix has apparently helped a little bit.  He is scheduled to be seen in the EP clinic tomorrow.  Current Outpatient Medications  Medication Sig Dispense Refill  . aspirin EC 81 MG EC tablet Take 1 tablet (81 mg total) by mouth daily. Swallow whole. 30 tablet 11  . atorvastatin (LIPITOR) 40 MG tablet Take 1 tablet (40 mg total) by mouth daily. 30 tablet 3  . B-12, Methylcobalamin, 1000 MCG SUBL Place 1,000 mcg under the tongue every morning.    . carvedilol (COREG) 3.125 MG tablet Take 1 tablet (3.125 mg total) by mouth 2 (two) times daily with a meal. 60 tablet 3  . furosemide (LASIX) 40 MG tablet Take 40 mg daily for weight gain of 3 lbs overnight or 5 lbs in 1 week. 90 tablet 3  . losartan (  COZAAR) 25 MG tablet Take 1 tablet (25 mg total) by mouth daily. 30 tablet 3  . Multiple Vitamin (MULTIVITAMIN ADULT PO) Take 1 tablet by mouth daily in the afternoon.    . nitroGLYCERIN (NITROSTAT) 0.4 MG SL tablet Place 1 tablet (0.4 mg total) under the tongue every 5 (five) minutes x 3 doses as needed for chest pain. 25 tablet 3  . Probiotic Product (PROBIOTIC PO) Take 1 tablet by mouth every morning.    .  psyllium (METAMUCIL) 58.6 % packet Take 1 packet by mouth every morning.    Marland Kitchen spironolactone (ALDACTONE) 25 MG tablet Take 0.5 tablets (12.5 mg total) by mouth daily. 30 tablet 3  . warfarin (COUMADIN) 5 MG tablet Take 1 tablet (5 mg total) by mouth daily at 4 PM. 30 tablet 3   No current facility-administered medications for this visit.      Physical Exam:   Ht 5\' 11"  (1.803 m)   BMI 21.20 kg/m   General:  Well-appearing  Chest:   Clear to auscultation  CV:   Tachycardic with regular rhythm, no murmurs appreciated  Incisions:  Healing nicely, sternum is stable  Abdomen:  Soft nontender  Extremities:  Warm and well-perfused  Diagnostic Tests:  CHEST - 2 VIEW  COMPARISON:  06/17/2020  FINDINGS: Multi lead left-sided pacemaker in place. Left atrial clipping. Prosthetic mitral valve. Stable mild cardiomegaly slight volume loss in the left hemithorax with left pleural effusion, increased in size from prior exam, at least moderate in size. Additional ill-defined in the left mid lung which is partially obscured by battery pack in the current exam, may represent loculated fluid in the fissure, this is similar to prior. Suspected small right pleural effusion is unchanged. Biapical pleuroparenchymal scarring. No pneumothorax. No pulmonary edema. No acute osseous abnormalities are seen.  IMPRESSION: 1. Left pleural effusion has increased in size from radiographs earlier this month, at least moderate in size. 2. Additional ill-defined opacity in the left mid lung is partially obscured by overlying artifact in the current exam, may represent loculated fluid in the fissure, this is similar to prior. Small right pleural effusion is unchanged. 3. Stable mild cardiomegaly.   Electronically Signed   By: Keith Rake M.D.   On: 07/05/2020 16:15   2 channel telemetry rhythm strip demonstrates AV paced tachycardia with regular rhythm 110 bpm   Impression:  Patient may be  having atypical atrial flutter versus malfunction of his pacemaker.  He otherwise seems to be doing remarkably well.  Chest x-ray reveals small left pleural effusion.    Plan:  We have not recommended any changes to the patient's current medications.  However, I have made it clear to the patient that he needs to be seen in the EP clinic tomorrow to interrogate his pacer defibrillator and discern whether or not he is having atypical atrial flutter, other rhythm disturbance, or malfunction of his pacemaker.  Until his rhythm problem has been straightened out he probably should avoid driving an automobile.  At this point he would otherwise be cleared for driving from a surgical standpoint.  I have encouraged the patient to gradually increase his physical activity as tolerated with his primary limitation remaining that he refrain from heavy lifting or strenuous use of his arms or shoulders for approximately 2 months.  He will return to this office in 4 to 6 weeks for routine follow-up chest x-ray to make sure his small left pleural effusion has resolved.  The patient has been reminded  regarding the importance of dental hygiene and the lifelong need for antibiotic prophylaxis for all dental cleanings and other related invasive procedures.     Valentina Gu. Roxy Manns, MD 07/05/2020 4:20 PM

## 2020-07-05 NOTE — Patient Instructions (Signed)
Continue all previous medications without any changes at this time  Keep your appointment tomorrow in the EP clinic  Do not drive a car until you are cleared by your cardiologist because of your heart rhythm  Continue to avoid any heavy lifting or strenuous use of your arms or shoulders for at least a total of three months from the time of surgery.  After three months you may gradually increase how much you lift or otherwise use your arms or chest as tolerated, with limits based upon whether or not activities lead to the return of significant discomfort.  Endocarditis is a potentially serious infection of heart valves or inside lining of the heart.  It occurs more commonly in patients with diseased heart valves (such as patient's with aortic or mitral valve disease) and in patients who have undergone heart valve repair or replacement.  Certain surgical and dental procedures may put you at risk, such as dental cleaning, other dental procedures, or any surgery involving the respiratory, urinary, gastrointestinal tract, gallbladder or prostate gland.   To minimize your chances for develooping endocarditis, maintain good oral health and seek prompt medical attention for any infections involving the mouth, teeth, gums, skin or urinary tract.    Always notify your doctor or dentist about your underlying heart valve condition before having any invasive procedures. You will need to take antibiotics before certain procedures, including all routine dental cleanings or other dental procedures.  Your cardiologist or dentist should prescribe these antibiotics for you to be taken ahead of time.

## 2020-07-05 NOTE — Addendum Note (Signed)
Addended by: Truddie Hidden on: 07/05/2020 09:51 AM   Modules accepted: Orders

## 2020-07-06 ENCOUNTER — Ambulatory Visit (INDEPENDENT_AMBULATORY_CARE_PROVIDER_SITE_OTHER): Payer: Medicare Other | Admitting: *Deleted

## 2020-07-06 ENCOUNTER — Ambulatory Visit (INDEPENDENT_AMBULATORY_CARE_PROVIDER_SITE_OTHER): Payer: Medicare Other | Admitting: Student

## 2020-07-06 ENCOUNTER — Encounter: Payer: Self-pay | Admitting: Student

## 2020-07-06 VITALS — BP 128/82 | Ht 71.0 in | Wt 152.2 lb

## 2020-07-06 DIAGNOSIS — Z950 Presence of cardiac pacemaker: Secondary | ICD-10-CM

## 2020-07-06 DIAGNOSIS — Z9889 Other specified postprocedural states: Secondary | ICD-10-CM

## 2020-07-06 DIAGNOSIS — Z951 Presence of aortocoronary bypass graft: Secondary | ICD-10-CM | POA: Diagnosis not present

## 2020-07-06 DIAGNOSIS — I208 Other forms of angina pectoris: Secondary | ICD-10-CM | POA: Diagnosis not present

## 2020-07-06 DIAGNOSIS — R0602 Shortness of breath: Secondary | ICD-10-CM

## 2020-07-06 DIAGNOSIS — I5022 Chronic systolic (congestive) heart failure: Secondary | ICD-10-CM | POA: Diagnosis not present

## 2020-07-06 LAB — CUP PACEART INCLINIC DEVICE CHECK
Battery Remaining Longevity: 88 mo
Battery Voltage: 2.98 V
Brady Statistic RA Percent Paced: 0.06 %
Brady Statistic RV Percent Paced: 98 %
Date Time Interrogation Session: 20220524121217
Implantable Lead Implant Date: 20130812
Implantable Lead Implant Date: 20190513
Implantable Lead Implant Date: 20190513
Implantable Lead Location: 753858
Implantable Lead Location: 753859
Implantable Lead Location: 753860
Implantable Pulse Generator Implant Date: 20190513
Lead Channel Impedance Value: 300 Ohm
Lead Channel Impedance Value: 362.5 Ohm
Lead Channel Impedance Value: 787.5 Ohm
Lead Channel Pacing Threshold Amplitude: 0.875 V
Lead Channel Pacing Threshold Amplitude: 1 V
Lead Channel Pacing Threshold Amplitude: 1.25 V
Lead Channel Pacing Threshold Pulse Width: 0.2 ms
Lead Channel Pacing Threshold Pulse Width: 0.4 ms
Lead Channel Pacing Threshold Pulse Width: 0.6 ms
Lead Channel Sensing Intrinsic Amplitude: 12 mV
Lead Channel Sensing Intrinsic Amplitude: 5 mV
Lead Channel Setting Pacing Amplitude: 2 V
Lead Channel Setting Pacing Amplitude: 2 V
Lead Channel Setting Pacing Amplitude: 2.25 V
Lead Channel Setting Pacing Pulse Width: 0.4 ms
Lead Channel Setting Pacing Pulse Width: 0.6 ms
Lead Channel Setting Sensing Sensitivity: 4 mV
Pulse Gen Model: 3562
Pulse Gen Serial Number: 9431568

## 2020-07-06 LAB — BASIC METABOLIC PANEL
BUN/Creatinine Ratio: 18 (ref 10–24)
BUN: 19 mg/dL (ref 8–27)
CO2: 22 mmol/L (ref 20–29)
Calcium: 9.2 mg/dL (ref 8.6–10.2)
Chloride: 99 mmol/L (ref 96–106)
Creatinine, Ser: 1.04 mg/dL (ref 0.76–1.27)
Glucose: 83 mg/dL (ref 65–99)
Potassium: 4.5 mmol/L (ref 3.5–5.2)
Sodium: 137 mmol/L (ref 134–144)
eGFR: 75 mL/min/{1.73_m2} (ref >59)

## 2020-07-06 LAB — PRO B NATRIURETIC PEPTIDE: NT-Pro BNP: 2139 pg/mL — ABNORMAL HIGH (ref 0–376)

## 2020-07-06 LAB — CBC WITH DIFFERENTIAL/PLATELET
Basophils Absolute: 0.1 10*3/uL (ref 0.0–0.2)
Basos: 1 %
EOS (ABSOLUTE): 0.2 10*3/uL (ref 0.0–0.4)
Eos: 2 %
Hematocrit: 31.7 % — ABNORMAL LOW (ref 37.5–51.0)
Hemoglobin: 10.7 g/dL — ABNORMAL LOW (ref 13.0–17.7)
Immature Grans (Abs): 0 10*3/uL (ref 0.0–0.1)
Immature Granulocytes: 1 %
Lymphocytes Absolute: 0.9 10*3/uL (ref 0.7–3.1)
Lymphs: 13 %
MCH: 31.5 pg (ref 26.6–33.0)
MCHC: 33.8 g/dL (ref 31.5–35.7)
MCV: 93 fL (ref 79–97)
Monocytes Absolute: 0.6 10*3/uL (ref 0.1–0.9)
Monocytes: 9 %
Neutrophils Absolute: 5.3 10*3/uL (ref 1.4–7.0)
Neutrophils: 74 %
Platelets: 395 10*3/uL (ref 150–450)
RBC: 3.4 x10E6/uL — ABNORMAL LOW (ref 4.14–5.80)
RDW: 12.4 % (ref 11.6–15.4)
WBC: 7.1 10*3/uL (ref 3.4–10.8)

## 2020-07-06 LAB — POCT INR: INR: 1.8 — AB (ref 2.0–3.0)

## 2020-07-06 MED ORDER — WARFARIN SODIUM 5 MG PO TABS: ORAL_TABLET | ORAL | 0 refills | Status: DC

## 2020-07-06 NOTE — Progress Notes (Signed)
Electrophysiology CRT AV optimization   Date: 07/06/2020  ID:  Ian Mcgee, DOB Jul 28, 1945, MRN [id number]  PCP: Mateo Flow, MD Primary Cardiologist: Jenne Campus, MD Electrophysiologist: Constance Haw, MD   CC: Heart failure despite LV lead placement  St. Jude dual chamber, implanted 09/2011 -> atrial lead revision and CRT-D upgrade 06/15/2017 for CHF  Ian Mcgee is a 75 y.o. malewith PMH CAD, EF 45% and h/o CHB s/p BiV, PAD/Carotid dx 60%, multiple medications intolerances, recently diagnosed with multivessel CAD (LM50%, ostial LAD50%, mod mid LAD 60%, iFR+, D2 dx 50%, mid 75%, calcific prox RCA 80% dx, anomalous LCX- small and heavily calcified), severe MR (carpentier type IIIb) on recent LHC/RHC 05/28/2020  Followed by Dr. Agustin Cree for years for moderate MR, CAD, complete heart block s/p PPM. Began having decreased exercise capacity in 2019. At that time had an echo with EF 40% and anterior wall akinesis. Given his symptoms and echo findings he underwent LHC which demonstrated diffuse moderate to severe RCA and LAD disease, severe MR and the thought was that given LHC findings his cardiomyopathy was due to pacemaker syndrome. Referred to EP for consideration of LV lead placement, and medical therapy was intensified but he had difficulty tolerating much GDMT.   Received BIV pacer on 06/2017 by Dr. Curt Bears. Remained active playing tennis, did not want to take antiplatelet therapy due to bruising was followed by lipid clinic but could never really tolerate lipid lowering therapy. By 09/2017 EF up to 50-55%, MR had improved to mild from severe. Similar findings on 2020 ECHO.   05/27/2020 saw his primary cardiologist and discussed he experienced significantly increased shortness of breath very difficult to play tennis, had an episode of PND. Decision was made to proceed with repeat LHC. Repeat ECHO 05/2020 with EF 40%, moderate-severe MR, normal RV function,  G2DD.   Shortly after LHC returned to the hospital 4/23 for chest pain and dyspnea on exertion while working.CXR- neg for pulm edema, clear lungs, BP 178/98, HR 82.Trop 27-> 31.BNP 320CT surgery consulted and patient underwent CABG x3 and MVR with maze procedure given history of PAF/ AFLas well as LAA clip.Did have post op AKI which improved by discharge cr 1.02. Started on GDMT, intermittently required diuretics, discharge weight 157lbs.    Since discharge, patient has been doing OK. Functional status gradually improving.  He states part of the time at rest his HR is in 63s, and he feels like he can "pick up and do whatever he needs to do". Other times he notes resting HR in upper 90s/100s and has increased fatigued and more SOB on exertion.   He presents today for attempts at V-V optimization of his CRT device.  LV lead History: Model: Quartet [id number] Location: Lateral CS branch Threshold 1.25V at 0.54ms Vector D1 - M2 Revisions/CXR: n/a Diaphragmatic Stim n/a VV timing LV -> RV by 35 ms on arrival -> changed to LV=RV on discharge. Prior optimization/changes n/a  CXR 07/05/20 (POST CABG)   Post implant 06/26/2017 CXR   Echocardiogram: Pre-device implant: EF 40% with RV patient dependent patient Post-device implant: 09/21/2017 EF improved to 50-55%  EKG: Pre-device implant: 05/08/2017 showed V pacing with QRS at 182 ms Post-device implant: 10/10/2017 shows BiV pacing with prominent Q in lead 1 and RSR' pattern in V1 at 162 ms  ICD interrogation: CRT pacing: 98%% AF: <1% PVC burden: n/a Thoracic impedence: has been low since CABG with recent +impedence status. Volume status stable on exam today.  Activity Level: 3-4 +hours daily, sometimes 6-8 hrs HR excursion: Histogram shows appropriate bell shape from 45 bpm (LRL) to 130 bpm. Though LONG follow up period noted as last clinic check 10/10/2017, so more acute changes would not be seen/bin in upper HRs. See PaceArt report  for full details.   Multi-site Pacing: Not pursued.  EKG:  EKG is ordered today. Baseline EKG: BiV pacing 101 bpm QRS 168 ms with RSR' in V1 and downward I, D1-M2; LV-> RV 35 ms D1-M2, LV=RV  BiV pacing, 81 bpm, PAV 170 ms, QRS 154 ms with RSR in V1 and downward I P4-M2, LV=RV, Biv Pacing but with frequent RV pacing due to elevated LV threshold D1-P4 with no significant change P4-M2 LV-> RV 20 ms with QRS 170 ms, but occasional RV pacing due to elevated LV threshold M2-P4 with no significant change  Assessment and Plan:  Today's Vitals   07/06/20 0929  BP: 128/82  SpO2: 98%  Weight: 152 lb 3.2 oz (69 kg)  Height: 5\' 11"  (1.803 m)   Body mass index is 21.23 kg/m.   1.  Chronic systolic heart failure s/p St Jude BiV PPM Patient reports initial improvement status post CRT implant, but lately has had increasing symptoms.  This confounded by recent CABG.  Device interrogation today demonstrates above. >98% BiV pacing Of note, his QRS was ~160 ms when he did have EF improvement. Unable to make much change today, but he has good LV pacing with RSR pattern in V1 and prominent downward deflection in I.   2. HR excursions Patients primary complaint today is issues with HRs at rest varying from 50-110 bpm, which seem to jump from one to the other at times. Only EGM on device today shows AT vs AFL with an atrial rate around 180 but only for 2 seconds. Overall AT/AF burden <1%.  Adjusted PMT detection to 110 for 2 weeks to further assess episodes   3. CAD s/p CABG S/p CABG 06/10/2020 Suspect his deconditioning is more related to shi.   Pt rates hovering around 98-102 on arrival. Continued with RA and RV threshold. After LV threshold testing, patient "broke" to 80 bpm. No V-A conduction induced today. Pt very active prior to recent event and has PVARP 300 but rate responsive PVARP as short as 125 ms. PMT detection previously set to 160 bpm with patient highly active.   We will also have a  better acute histogram on his next check with clearing his counters today.   Disposition:   FU with EP-APP in 2 weeks to recheck episodes with PMT detection turned down to 110 in attempt to identify any issues which may be causing patient HRs to quickly jump from one rate to another.   Jacalyn Lefevre, PA-C  07/06/2020 8:51 AM  [facility] [address] [place]  [address] [phone] (office) [phone] (fax

## 2020-07-06 NOTE — Patient Instructions (Signed)
Description    Start taking warfarin 1.5 tablets daily except for 1 tablet on Sundays and Thursdays. Recheck INR in 1 week. [facility] [phone].

## 2020-07-06 NOTE — Patient Instructions (Signed)

## 2020-07-07 ENCOUNTER — Telehealth: Payer: Self-pay | Admitting: Cardiology

## 2020-07-07 DIAGNOSIS — I509 Heart failure, unspecified: Secondary | ICD-10-CM | POA: Diagnosis not present

## 2020-07-07 DIAGNOSIS — I779 Disorder of arteries and arterioles, unspecified: Secondary | ICD-10-CM | POA: Diagnosis not present

## 2020-07-07 DIAGNOSIS — Z952 Presence of prosthetic heart valve: Secondary | ICD-10-CM | POA: Diagnosis not present

## 2020-07-07 DIAGNOSIS — I340 Nonrheumatic mitral (valve) insufficiency: Secondary | ICD-10-CM | POA: Diagnosis not present

## 2020-07-07 DIAGNOSIS — Z951 Presence of aortocoronary bypass graft: Secondary | ICD-10-CM | POA: Diagnosis not present

## 2020-07-07 NOTE — Telephone Encounter (Signed)
Called patient informed him of results.

## 2020-07-07 NOTE — Telephone Encounter (Signed)
Follow up:    Patient returning nurses call back.

## 2020-07-09 DIAGNOSIS — Z951 Presence of aortocoronary bypass graft: Secondary | ICD-10-CM | POA: Diagnosis not present

## 2020-07-09 DIAGNOSIS — Z952 Presence of prosthetic heart valve: Secondary | ICD-10-CM | POA: Diagnosis not present

## 2020-07-09 DIAGNOSIS — I509 Heart failure, unspecified: Secondary | ICD-10-CM | POA: Diagnosis not present

## 2020-07-09 DIAGNOSIS — I340 Nonrheumatic mitral (valve) insufficiency: Secondary | ICD-10-CM | POA: Diagnosis not present

## 2020-07-09 DIAGNOSIS — I779 Disorder of arteries and arterioles, unspecified: Secondary | ICD-10-CM | POA: Diagnosis not present

## 2020-07-10 ENCOUNTER — Encounter: Payer: Self-pay | Admitting: Thoracic Surgery (Cardiothoracic Vascular Surgery)

## 2020-07-10 DIAGNOSIS — R000 Tachycardia, unspecified: Secondary | ICD-10-CM

## 2020-07-13 ENCOUNTER — Ambulatory Visit (INDEPENDENT_AMBULATORY_CARE_PROVIDER_SITE_OTHER): Payer: Medicare Other

## 2020-07-13 ENCOUNTER — Other Ambulatory Visit: Payer: Self-pay

## 2020-07-13 DIAGNOSIS — Z9889 Other specified postprocedural states: Secondary | ICD-10-CM | POA: Diagnosis not present

## 2020-07-13 DIAGNOSIS — Z7901 Long term (current) use of anticoagulants: Secondary | ICD-10-CM

## 2020-07-13 LAB — POCT INR: INR: 1.5 — AB (ref 2.0–3.0)

## 2020-07-13 MED ORDER — CARVEDILOL 6.25 MG PO TABS: 6.2500 mg | ORAL_TABLET | Freq: Two times a day (BID) | ORAL | 3 refills | Status: DC

## 2020-07-13 NOTE — Progress Notes (Signed)
Remote pacemaker transmission.   

## 2020-07-13 NOTE — Patient Instructions (Signed)
Take 2.5 tablets tonight only and then continue taking warfarin 1.5 tablets daily except for 1 tablet on Sundays and Thursdays. Recheck INR in 2 weeks. [facility] [phone].

## 2020-07-15 ENCOUNTER — Other Ambulatory Visit: Payer: Self-pay | Admitting: Emergency Medicine

## 2020-07-15 DIAGNOSIS — Z79899 Other long term (current) drug therapy: Secondary | ICD-10-CM

## 2020-07-15 LAB — BASIC METABOLIC PANEL
BUN/Creatinine Ratio: 20 (ref 10–24)
BUN: 19 mg/dL (ref 8–27)
CO2: 23 mmol/L (ref 20–29)
Calcium: 9.1 mg/dL (ref 8.6–10.2)
Chloride: 98 mmol/L (ref 96–106)
Creatinine, Ser: 0.95 mg/dL (ref 0.76–1.27)
Glucose: 120 mg/dL — ABNORMAL HIGH (ref 65–99)
Potassium: 4.3 mmol/L (ref 3.5–5.2)
Sodium: 137 mmol/L (ref 134–144)
eGFR: 84 mL/min/{1.73_m2} (ref >59)

## 2020-07-15 NOTE — Progress Notes (Signed)
Ordered bmp per Dr. Agustin Cree request.

## 2020-07-16 DIAGNOSIS — Z955 Presence of coronary angioplasty implant and graft: Secondary | ICD-10-CM | POA: Diagnosis not present

## 2020-07-16 DIAGNOSIS — Z952 Presence of prosthetic heart valve: Secondary | ICD-10-CM | POA: Diagnosis not present

## 2020-07-16 DIAGNOSIS — Z9889 Other specified postprocedural states: Secondary | ICD-10-CM | POA: Diagnosis not present

## 2020-07-19 ENCOUNTER — Ambulatory Visit (INDEPENDENT_AMBULATORY_CARE_PROVIDER_SITE_OTHER): Payer: Medicare Other

## 2020-07-19 ENCOUNTER — Other Ambulatory Visit: Payer: Self-pay

## 2020-07-19 DIAGNOSIS — Z955 Presence of coronary angioplasty implant and graft: Secondary | ICD-10-CM | POA: Diagnosis not present

## 2020-07-19 DIAGNOSIS — Z9889 Other specified postprocedural states: Secondary | ICD-10-CM | POA: Diagnosis not present

## 2020-07-19 DIAGNOSIS — R002 Palpitations: Secondary | ICD-10-CM | POA: Diagnosis not present

## 2020-07-19 DIAGNOSIS — Z952 Presence of prosthetic heart valve: Secondary | ICD-10-CM | POA: Diagnosis not present

## 2020-07-19 NOTE — Progress Notes (Signed)
Electrophysiology Office Note Date: 07/20/2020  ID:  [name], DOB Apr 07, 1945, MRN [id number]  PCP: Mateo Flow, MD Primary Cardiologist: Jenne Campus, MD Electrophysiologist: Constance Haw, MD   CC: Routine ICD follow-up  Ian Mcgee is a 75 y.o. male seen today for Ian Meredith Leeds, MD for routine electrophysiology followup.  Since last being seen in our clinic the patient reports doing OK. He continues to have HRs at home ranging from 60s to "stuck" around 100. He had mild hypotension on coreg and it was decreased to 3.125 mg dosing.  he denies chest pain, palpitations, dyspnea, PND, orthopnea, nausea, vomiting, dizziness, syncope, edema, weight gain, or early satiety. He has not had ICD shocks.   Device History: St. Jude dual chamber, implanted 09/2011 -> atrial lead revision and CRT-D upgrade 06/15/2017 for CHF  Past Medical History:  Diagnosis Date  . Acute on chronic systolic heart failure ([place])   . Attention deficit disorder (ADD)   . Cardiac pacemaker 05/10/2017  . Cardiomyopathy ([place])   . Chest pain 06/05/2020  . Chronic apical periodontitis   . Complete heart block ([place]) 06/10/2015  . Coronary artery disease    40% left main, 50% LAD, 50% RCA recent cardiac catheterization from March 2019  . Dyslipidemia   . Encounter for preoperative dental examination   . Essential hypertension   . Gingival recession, generalized   . Gingivitis   . Hyperlipidemia   . Long term (current) use of anticoagulants 06/22/2020  . Mitral regurgitation   . NSTEMI (non-ST elevated myocardial infarction) ([place]) 06/06/2020  . Pacemaker reprogramming/check 06/10/2015  . Peripheral vascular disease ([place])    Noncritical bilateral carotid arterial disease  . Pneumonia   . S/P CABG x 3 06/10/2020   LIMA to LAD, SVG to Diag, SVG to PDA  . S/P Maze operation for atrial fibrillation 06/10/2020   Left atrial lesion set using bipolar radiofrequency and cryothermy ablation with  clipping of LA appendage  . S/P mitral valve repair 06/10/2020   Annuloplasty with a M28 mm Model [address] [place]  SN: [id number]    . Status post biventricular pacemaker 07/16/2017  . Syncope and collapse   . Teeth missing    Past Surgical History:  Procedure Laterality Date  . Basel Cell Cancer    . BIV UPGRADE N/A 06/25/2017   Procedure: BIV UPGRADE;  Surgeon: Constance Haw, MD;  Location: [place] [facility];  Service: Cardiovascular;  Laterality: N/A;  . BUBBLE STUDY  06/03/2020   Procedure: BUBBLE STUDY;  Surgeon: [name], MD;  Location: [facility] ENDOSCOPY;  Service: Cardiology;;  . CATARACT EXTRACTION    . CLIPPING OF ATRIAL APPENDAGE  06/10/2020   Procedure: CLIPPING OF ATRIAL APPENDAGE USING  ATRICURE PRO2 CLIP SIZE 45MM;  Surgeon: Rexene Alberts, MD;  Location: [facility] OR;  Service: Open Heart Surgery;;  . CORONARY ARTERY BYPASS GRAFT N/A 06/10/2020   Procedure: CORONARY ARTERY BYPASS GRAFTING (CABG) X THREE USING LEFT INTERNAL MAMMARY ARTERY AND BILATERAL LEGS GREATER SAPHENOUS VEIN HARVESTED ENDOSCOPICALLY;  Surgeon: Rexene Alberts, MD;  Location: [place];  Service: Open Heart Surgery;  Laterality: N/A;  . INSERT / REPLACE / REMOVE PACEMAKER    . INTRAVASCULAR PRESSURE WIRE/FFR STUDY N/A 05/28/2020   Procedure: INTRAVASCULAR PRESSURE WIRE/FFR STUDY;  Surgeon: [name], MD;  Location: [place] [facility];  Service: Cardiovascular;  Laterality: N/A;  . KNEE SURGERY Right    ACL Reconstruction  . LEFT HEART  CATH AND CORONARY ANGIOGRAPHY N/A 05/10/2017   Procedure: LEFT HEART CATH AND CORONARY ANGIOGRAPHY;  Surgeon: [name], MD;  Location: [place] [facility];  Service: Cardiovascular;  Laterality: N/A;  . MAZE N/A 06/10/2020   Procedure: MAZE;  Surgeon: Rexene Alberts, MD;  Location: [place];  Service: Open Heart Surgery;  Laterality: N/A;  . MITRAL VALVE REPAIR N/A 06/10/2020   Procedure: MITRAL VALVE REPAIR USING CARPENTIER MCCARTHY-ADAMS RING  SIZE 28MM;  Surgeon: Rexene Alberts, MD;  Location: [place];  Service: Open Heart Surgery;  Laterality: N/A;  . RIGHT/LEFT HEART CATH AND CORONARY ANGIOGRAPHY N/A 05/28/2020   Procedure: RIGHT/LEFT HEART CATH AND CORONARY ANGIOGRAPHY;  Surgeon: [name], MD;  Location: [place] [facility];  Service: Cardiovascular;  Laterality: N/A;  . TEE WITHOUT CARDIOVERSION N/A 06/03/2020   Procedure: TRANSESOPHAGEAL ECHOCARDIOGRAM (TEE);  Surgeon: [name], MD;  Location: [place];  Service: Cardiology;  Laterality: N/A;  . TEE WITHOUT CARDIOVERSION N/A 06/10/2020   Procedure: TRANSESOPHAGEAL ECHOCARDIOGRAM (TEE);  Surgeon: Rexene Alberts, MD;  Location: [place];  Service: Open Heart Surgery;  Laterality: N/A;  . VASECTOMY      Current Outpatient Medications  Medication Sig Dispense Refill  . aspirin EC 81 MG EC tablet Take 1 tablet (81 mg total) by mouth daily. Swallow whole. 30 tablet 11  . atorvastatin (LIPITOR) 40 MG tablet Take 1 tablet (40 mg total) by mouth daily. 30 tablet 3  . B-12, Methylcobalamin, 1000 MCG SUBL Place 1,000 mcg under the tongue every morning.    . furosemide (LASIX) 40 MG tablet Take 40 mg by mouth daily.    Marland Kitchen losartan (COZAAR) 25 MG tablet Take 1 tablet (25 mg total) by mouth daily. 30 tablet 3  . metoprolol succinate (TOPROL XL) 25 MG 24 hr tablet Take 0.5 tablets (12.5 mg total) by mouth at bedtime. 45 tablet 2  . Multiple Vitamin (MULTIVITAMIN ADULT PO) Take 1 tablet by mouth daily in the afternoon.    . nitroGLYCERIN (NITROSTAT) 0.4 MG SL tablet Place 1 tablet (0.4 mg total) under the tongue every 5 (five) minutes x 3 doses as needed for chest pain. 25 tablet 3  . Probiotic Product (PROBIOTIC PO) Take 1 tablet by mouth every morning.    . psyllium (METAMUCIL) 58.6 % packet Take 1 packet by mouth every morning.    Marland Kitchen spironolactone (ALDACTONE) 25 MG tablet Take 0.5 tablets (12.5 mg total) by mouth daily. 30 tablet 3  . warfarin (COUMADIN) 5 MG tablet Take 1  tablet to 1 and 1/2 tablets by mouth daily or as directed by the coumadin clinic. 45 tablet 0   No current facility-administered medications for this visit.    Allergies:   Ciprofloxacin, Crestor [rosuvastatin calcium], and Nexletol [bempedoic acid]   Social History: Social History   Socioeconomic History  . Marital status: Married    Spouse name: Not on file  . Number of children: Not on file  . Years of education: Not on file  . Highest education level: Not on file  Occupational History  . Occupation: Engineer, drilling  Tobacco Use  . Smoking status: Former Smoker    Types: Cigarettes  . Smokeless tobacco: Never Used  Vaping Use  . Vaping Use: Never used  Substance and Sexual Activity  . Alcohol use: Yes    Comment: Wine with dinner  . Drug use: Never  . Sexual activity: Not on file  Other Topics Concern  . Not on file  Social History Narrative  . Not on file   Social Determinants of Health   Financial Resource Strain: Not on file  Food Insecurity: No Food Insecurity  . Worried About Charity fundraiser in the Last Year: Never true  . Ran Out of Food in the Last Year: Never true  Transportation Needs: No Transportation Needs  . Lack of Transportation (Medical): No  . Lack of Transportation (Non-Medical): No  Physical Activity: Not on file  Stress: Not on file  Social Connections: Not on file  Intimate Partner Violence: Not on file    Family History: Family History  Problem Relation Age of Onset  . CAD Mother   . Hypertension Mother   . Osteoporosis Mother   . CAD Father   . Leukemia Father   . Ovarian cancer Sister   . Healthy Brother   . CAD Brother   . Rheumatic fever Brother     Review of Systems: All other systems reviewed and are otherwise negative except as noted above.   Physical Exam: Vitals:   07/20/20 1118  BP: 98/60  Pulse: 100  SpO2: 97%  Weight: 151 lb 3.2 oz (68.6 kg)  Height: 5\' 11"  (1.803 m)     GEN- The patient is  well appearing, alert and oriented x 3 today.   HEENT: normocephalic, atraumatic; sclera clear, conjunctiva pink; hearing intact; oropharynx clear; neck supple, no JVP Lymph- no cervical lymphadenopathy Lungs- Clear to ausculation bilaterally, normal work of breathing.  No wheezes, rales, rhonchi Heart- Regular rate and rhythm, no murmurs, rubs or gallops, PMI not laterally displaced GI- soft, non-tender, non-distended, bowel sounds present, no hepatosplenomegaly Extremities- no clubbing or cyanosis. No edema; DP/PT/radial pulses 2+ bilaterally MS- no significant deformity or atrophy Skin- warm and dry, no rash or lesion; ICD pocket well healed Psych- euthymic mood, full affect Neuro- strength and sensation are intact  ICD interrogation- reviewed in detail today,  See PACEART report  EKG:  EKG is not ordered today.  Recent Labs: 06/09/2020: ALT 20 06/15/2020: B Natriuretic Peptide 492.8; Magnesium 2.2 07/05/2020: Hemoglobin 10.7; NT-Pro BNP 2,139; Platelets 395 07/15/2020: BUN 19; Creatinine, Ser 0.95; Potassium 4.3; Sodium 137   Wt Readings from Last 3 Encounters:  07/20/20 151 lb 3.2 oz (68.6 kg)  07/06/20 152 lb 3.2 oz (69 kg)  07/05/20 146 lb (66.2 kg)     Other studies Reviewed: Additional studies/ records that were reviewed today include: Previous EP office notes   Assessment and Plan:  1.  Chronic systolic heart failure s/p St Jude BiV PPM  Patient reports initial improvement status post CRT implant, but lately has had increasing symptoms.  This confounded by recent CABG.  Device interrogation today demonstrates above. >98% BiV pacing Of note, his QRS was ~160 ms when he did have EF improvement.  No good options V-V optimization.   2. HR excursions -> Appears to be atrial tachycardia Patients primary complaint today is issues with HRs at rest varying from 50-110 bpm, which seem to jump from one to the other at times.  He appears to have an atrial tachycardia right around  100 with reviewing previous EKGs with Dr. Curt Bears Ian continue BB. Ian switch from coreg to Toprol 12.5 mg at bedtime for less anti-hypertensive component.  It is our hope that his atrial tachycardia Ian improve as he gets further out from his CABG. PMT/BELT less likely as have been unable to induce V-A conduction, although, after PVARP adjustments at last visit, his AT did  not "break" with threshold testing today as it had previously. Ian ask industry to help with his next check to further trouble shoot PVARP.   3. CAD s/p CABG S/p CABG 06/10/2020 Suspect his deconditioning is more related to post op general deconditioning and recovery.   Current medicines are reviewed at length with the patient today.   The patient does not have concerns regarding his medicines.  The following changes were made today:  Switch coreg to Toprol.   Labs/ tests ordered today include:  No orders of the defined types were placed in this encounter.   Disposition:   Follow up with Dr. Curt Bears  6-8 weeks   Signed, Shirley Friar, PA-C  07/20/2020 1:01 PM  [facility] [place] [address] [phone] (office) [phone] (fax)

## 2020-07-20 ENCOUNTER — Other Ambulatory Visit: Payer: Self-pay

## 2020-07-20 ENCOUNTER — Ambulatory Visit (INDEPENDENT_AMBULATORY_CARE_PROVIDER_SITE_OTHER): Payer: Medicare Other | Admitting: Student

## 2020-07-20 ENCOUNTER — Encounter: Payer: Self-pay | Admitting: Student

## 2020-07-20 VITALS — BP 98/60 | HR 100 | Ht 71.0 in | Wt 151.2 lb

## 2020-07-20 DIAGNOSIS — R000 Tachycardia, unspecified: Secondary | ICD-10-CM | POA: Diagnosis not present

## 2020-07-20 DIAGNOSIS — I208 Other forms of angina pectoris: Secondary | ICD-10-CM | POA: Diagnosis not present

## 2020-07-20 DIAGNOSIS — I5022 Chronic systolic (congestive) heart failure: Secondary | ICD-10-CM | POA: Diagnosis not present

## 2020-07-20 DIAGNOSIS — Z951 Presence of aortocoronary bypass graft: Secondary | ICD-10-CM | POA: Diagnosis not present

## 2020-07-20 LAB — CUP PACEART INCLINIC DEVICE CHECK
Battery Remaining Longevity: 81 mo
Battery Voltage: 2.98 V
Brady Statistic RA Percent Paced: 0.03 %
Brady Statistic RV Percent Paced: 98 %
Date Time Interrogation Session: 20220607125608
Implantable Lead Implant Date: 20130812
Implantable Lead Implant Date: 20190513
Implantable Lead Implant Date: 20190513
Implantable Lead Location: 753858
Implantable Lead Location: 753859
Implantable Lead Location: 753860
Implantable Pulse Generator Implant Date: 20190513
Lead Channel Impedance Value: 287.5 Ohm
Lead Channel Impedance Value: 362.5 Ohm
Lead Channel Impedance Value: 775 Ohm
Lead Channel Pacing Threshold Amplitude: 1 V
Lead Channel Pacing Threshold Amplitude: 1 V
Lead Channel Pacing Threshold Amplitude: 1.125 V
Lead Channel Pacing Threshold Pulse Width: 0.2 ms
Lead Channel Pacing Threshold Pulse Width: 0.4 ms
Lead Channel Pacing Threshold Pulse Width: 0.6 ms
Lead Channel Sensing Intrinsic Amplitude: 12 mV
Lead Channel Sensing Intrinsic Amplitude: 5 mV
Lead Channel Setting Pacing Amplitude: 2 V
Lead Channel Setting Pacing Amplitude: 2 V
Lead Channel Setting Pacing Amplitude: 2.125
Lead Channel Setting Pacing Pulse Width: 0.4 ms
Lead Channel Setting Pacing Pulse Width: 0.6 ms
Lead Channel Setting Sensing Sensitivity: 4 mV
Pulse Gen Model: 3562
Pulse Gen Serial Number: 9431568

## 2020-07-20 MED ORDER — METOPROLOL SUCCINATE ER 25 MG PO TB24: 12.5000 mg | ORAL_TABLET | Freq: Every day | ORAL | 2 refills | Status: DC

## 2020-07-20 NOTE — Patient Instructions (Signed)
Medication Instructions:  Your physician has recommended you make the following change in your medication:   STOP: Carvedilol START: Metoprolol Succinate 12.5mg  at bedtime  *If you need a refill on your cardiac medications before your next appointment, please call your pharmacy*   Lab Work: None If you have labs (blood work) drawn today and your tests are completely normal, you will receive your results only by: Marland Kitchen MyChart Message (if you have MyChart) OR . A paper copy in the mail If you have any lab test that is abnormal or we need to change your treatment, we will call you to review the results.   Follow-Up: At [facility], you and your health needs are our priority.  As part of our continuing mission to provide you with exceptional heart care, we have created designated Provider Care Teams.  These Care Teams include your primary Cardiologist (physician) and Advanced Practice Providers (APPs -  Physician Assistants and Nurse Practitioners) who all work together to provide you with the care you need, when you need it.   Your next appointment:   As scheduled

## 2020-07-21 ENCOUNTER — Telehealth: Payer: Self-pay

## 2020-07-21 DIAGNOSIS — Z955 Presence of coronary angioplasty implant and graft: Secondary | ICD-10-CM | POA: Diagnosis not present

## 2020-07-21 DIAGNOSIS — Z9889 Other specified postprocedural states: Secondary | ICD-10-CM | POA: Diagnosis not present

## 2020-07-21 DIAGNOSIS — Z952 Presence of prosthetic heart valve: Secondary | ICD-10-CM | POA: Diagnosis not present

## 2020-07-21 NOTE — Telephone Encounter (Signed)
Patient notified of results.

## 2020-07-21 NOTE — Telephone Encounter (Signed)
-----   Message from Park Liter, MD sent at 07/19/2020 10:19 AM EDT ----- Labs are good, and I spoke to him about it, he will need to go.  He will come for Zio patch today he need to wear Zio patch for 1 week

## 2020-07-23 ENCOUNTER — Ambulatory Visit (INDEPENDENT_AMBULATORY_CARE_PROVIDER_SITE_OTHER): Payer: Medicare Other

## 2020-07-23 ENCOUNTER — Other Ambulatory Visit: Payer: Self-pay

## 2020-07-23 DIAGNOSIS — I340 Nonrheumatic mitral (valve) insufficiency: Secondary | ICD-10-CM

## 2020-07-23 DIAGNOSIS — Z9889 Other specified postprocedural states: Secondary | ICD-10-CM | POA: Diagnosis not present

## 2020-07-23 LAB — ECHOCARDIOGRAM COMPLETE
Area-P 1/2: 7.59 cm2
Calc EF: 26.1 %
MV VTI: 1.74 cm2
S' Lateral: 4.2 cm
Single Plane A2C EF: 34.5 %
Single Plane A4C EF: 24.2 %

## 2020-07-23 NOTE — Progress Notes (Addendum)
Complete echocardiogram w/strain performed.  Jimmy Murrell Dome RDCS, RVT

## 2020-07-26 DIAGNOSIS — Z9889 Other specified postprocedural states: Secondary | ICD-10-CM | POA: Diagnosis not present

## 2020-07-26 DIAGNOSIS — Z955 Presence of coronary angioplasty implant and graft: Secondary | ICD-10-CM | POA: Diagnosis not present

## 2020-07-26 DIAGNOSIS — Z952 Presence of prosthetic heart valve: Secondary | ICD-10-CM | POA: Diagnosis not present

## 2020-07-27 ENCOUNTER — Other Ambulatory Visit: Payer: Self-pay

## 2020-07-27 ENCOUNTER — Ambulatory Visit (INDEPENDENT_AMBULATORY_CARE_PROVIDER_SITE_OTHER): Payer: Medicare Other

## 2020-07-27 ENCOUNTER — Telehealth: Payer: Self-pay | Admitting: Cardiology

## 2020-07-27 DIAGNOSIS — Z9889 Other specified postprocedural states: Secondary | ICD-10-CM

## 2020-07-27 DIAGNOSIS — Z7901 Long term (current) use of anticoagulants: Secondary | ICD-10-CM

## 2020-07-27 LAB — POCT INR: INR: 1.9 — AB (ref 2.0–3.0)

## 2020-07-27 NOTE — Patient Instructions (Signed)
Increase to 1.5 tablets daily. Recheck INR in 3 weeks. [facility] [phone].

## 2020-07-27 NOTE — Telephone Encounter (Signed)
Ian Mcgee is returning Ian Mcgee's call about his Echo. States results can be left on the VM so he calls back with questions if necessary if the call is missed again. Please advise.

## 2020-07-27 NOTE — Telephone Encounter (Signed)
Called patient back. Informed him of results.

## 2020-07-28 DIAGNOSIS — Z952 Presence of prosthetic heart valve: Secondary | ICD-10-CM | POA: Diagnosis not present

## 2020-07-28 DIAGNOSIS — Z955 Presence of coronary angioplasty implant and graft: Secondary | ICD-10-CM | POA: Diagnosis not present

## 2020-07-28 DIAGNOSIS — Z9889 Other specified postprocedural states: Secondary | ICD-10-CM | POA: Diagnosis not present

## 2020-07-29 ENCOUNTER — Other Ambulatory Visit: Payer: Self-pay | Admitting: Thoracic Surgery (Cardiothoracic Vascular Surgery)

## 2020-07-29 DIAGNOSIS — Z951 Presence of aortocoronary bypass graft: Secondary | ICD-10-CM

## 2020-07-30 ENCOUNTER — Other Ambulatory Visit: Payer: Self-pay | Admitting: *Deleted

## 2020-07-30 DIAGNOSIS — Z952 Presence of prosthetic heart valve: Secondary | ICD-10-CM | POA: Diagnosis not present

## 2020-07-30 DIAGNOSIS — Z9889 Other specified postprocedural states: Secondary | ICD-10-CM | POA: Diagnosis not present

## 2020-07-30 DIAGNOSIS — Z955 Presence of coronary angioplasty implant and graft: Secondary | ICD-10-CM | POA: Diagnosis not present

## 2020-07-30 NOTE — Patient Outreach (Signed)
[facility]) Care Management  07/30/2020  Ian Mcgee [birth year] [id number]   Telephone Assessment-Unsuccessful  RN attempted outreach call today however unsuccessful. RN able to leave a HIPAA approved voice message requesting a call back.  Will rescheduled another outreach over the next week for ongoing [facility] services.  Raina Mina, RN Care Management Coordinator [place] Office [phone]

## 2020-08-02 ENCOUNTER — Ambulatory Visit (INDEPENDENT_AMBULATORY_CARE_PROVIDER_SITE_OTHER): Payer: Self-pay | Admitting: Surgical

## 2020-08-02 ENCOUNTER — Ambulatory Visit
Admission: RE | Admit: 2020-08-02 | Discharge: 2020-08-02 | Disposition: A | Discharge status: Home / Self Care | Payer: Medicare Other | Source: Ambulatory Visit | Attending: Thoracic Surgery (Cardiothoracic Vascular Surgery) | Admitting: Thoracic Surgery (Cardiothoracic Vascular Surgery)

## 2020-08-02 ENCOUNTER — Other Ambulatory Visit: Payer: Self-pay

## 2020-08-02 VITALS — BP 125/84 | HR 98 | Resp 20 | Ht 71.0 in | Wt 145.0 lb

## 2020-08-02 DIAGNOSIS — Z951 Presence of aortocoronary bypass graft: Secondary | ICD-10-CM | POA: Diagnosis not present

## 2020-08-02 DIAGNOSIS — Z9889 Other specified postprocedural states: Secondary | ICD-10-CM | POA: Diagnosis not present

## 2020-08-02 DIAGNOSIS — Z955 Presence of coronary angioplasty implant and graft: Secondary | ICD-10-CM | POA: Diagnosis not present

## 2020-08-02 DIAGNOSIS — I517 Cardiomegaly: Secondary | ICD-10-CM | POA: Diagnosis not present

## 2020-08-02 DIAGNOSIS — Z952 Presence of prosthetic heart valve: Secondary | ICD-10-CM | POA: Diagnosis not present

## 2020-08-02 NOTE — Progress Notes (Signed)
[address]       [place], [address]             [phone]      Ian Mcgee [place] Medical Record #[id number] Date of Birth: 15-Sep-1945  Referring: Lelon Perla, MD Primary Care: Mateo Flow, MD Primary Cardiologist: Jenne Campus, MD   Chief Complaint:   POST OP FOLLOW UP CARDIOTHORACIC SURGERY OPERATIVE NOTE   Date of Procedure:                06/10/2020   Preoperative Diagnosis:       Severe Multi-vessel Coronary Artery Disease Severe Mitral Regurgitation Recurrent Persistent Atrial Fibrillation   Postoperative Diagnosis:    Same   Procedure:        Coronary Artery Bypass Grafting x 3              Left Internal Mammary Artery to Distal Left Anterior Descending Coronary Artery             Saphenous Vein Graft to Posterior Descending Coronary Artery             Saphenous Vein Graft to Diagonal Branch Coronary Artery             Endoscopic Vein Harvest from Bilateral Thighs   Mitral Valve Repair             Edwards McCarthy-Adams IMR ETlogix Ring Annuloplasty (size 57mm, model #4100, serial [id number])   Maze Procedure              Complete left atrial lesion set using bipolar radiofrequency and cryothermy ablation             Clipping of left atrial appendage (Atricure Pro245 left atrial clip, size 15mm)                Surgeon:        Valentina Gu. Roxy Manns, MD   Assistant:       Ellwood Handler, PA-C   Anesthesia:    Roberts Gaudy, MD   Operative Findings: Dilated cardiomyopathy with severe left ventricular dysfunction, EF 35% in the setting of moderate-severe mitral regurgitation Type I and type IIIB mitral valve dysfunction with moderate-severe secondary mitral regurgitation Good quality left internal mammary conduit for grafting Fair to good quality saphenous vein conduit for grafting Good quality target vessels for grafting No residual mitral regurgitation after successful valve repair History of Present Illness:    The  patient is a 75 year old male status post the above described procedure seen in the office on today's date and routine postsurgical follow-up.  Overall patient describes excellent ongoing progress.  He is in fact walked up to 6-1/2 miles in a day including hills.  He continues to have some issues related to intermittent tachycardias and has had a recent Zio patch placed.  He is followed by both EP and cardiology.  He continues to be on Lasix daily and describes no symptoms of shortness of breath or peripheral edema.  He has had no significant difficulties with his incisions.  He has only mild soreness related to the sternotomy which continues to improve over time.  While he has some concerns related to the medical aspects of his recovery he is quite pleased with the results of the surgery.  He knows long-term follow-up will be with cardiology and EP to maximize therapies to enhance lifestyle and longevity.      Past Medical History:  Diagnosis Date  Acute on chronic systolic heart failure (HCC)    Attention deficit disorder (ADD)    Cardiac pacemaker 05/10/2017   Cardiomyopathy ([place])    Chest pain 06/05/2020   Chronic apical periodontitis    Complete heart block ([place]) 06/10/2015   Coronary artery disease    40% left main, 50% LAD, 50% RCA recent cardiac catheterization from March 2019   Dyslipidemia    Encounter for preoperative dental examination    Essential hypertension    Gingival recession, generalized    Gingivitis    Hyperlipidemia    Long term (current) use of anticoagulants 06/22/2020   Mitral regurgitation    NSTEMI (non-ST elevated myocardial infarction) ([place]) 06/06/2020   Pacemaker reprogramming/check 06/10/2015   Peripheral vascular disease ([place])    Noncritical bilateral carotid arterial disease   Pneumonia    S/P CABG x 3 06/10/2020   LIMA to LAD, SVG to Diag, SVG to PDA   S/P Maze operation for atrial fibrillation 06/10/2020   Left atrial lesion set using bipolar  radiofrequency and cryothermy ablation with clipping of LA appendage   S/P mitral valve repair 06/10/2020   Annuloplasty with a M28 mm Model 33  Ian Mcgee  SN: [id number]     Status post biventricular pacemaker 07/16/2017   Syncope and collapse    Teeth missing      Social History   Tobacco Use  Smoking Status Former   Pack years: 0.00   Types: Cigarettes  Smokeless Tobacco Never    Social History   Substance and Sexual Activity  Alcohol Use Yes   Comment: Wine with dinner     Allergies  Allergen Reactions   Ciprofloxacin Other (See Comments)    Bleeding (intolerance)   Crestor [Rosuvastatin Calcium] Other (See Comments)    Muscle twinges   Nexletol [Bempedoic Acid] Other (See Comments)    Muscle and joint pain    Current Outpatient Medications  Medication Sig Dispense Refill   aspirin EC 81 MG EC tablet Take 1 tablet (81 mg total) by mouth daily. Swallow whole. 30 tablet 11   atorvastatin (LIPITOR) 40 MG tablet Take 1 tablet (40 mg total) by mouth daily. 30 tablet 3   B-12, Methylcobalamin, 1000 MCG SUBL Place 1,000 mcg under the tongue every morning.     furosemide (LASIX) 40 MG tablet Take 40 mg by mouth daily.     losartan (COZAAR) 25 MG tablet Take 1 tablet (25 mg total) by mouth daily. 30 tablet 3   metoprolol succinate (TOPROL XL) 25 MG 24 hr tablet Take 0.5 tablets (12.5 mg total) by mouth at bedtime. 45 tablet 2   Multiple Vitamin (MULTIVITAMIN ADULT PO) Take 1 tablet by mouth daily in the afternoon.     nitroGLYCERIN (NITROSTAT) 0.4 MG SL tablet Place 1 tablet (0.4 mg total) under the tongue every 5 (five) minutes x 3 doses as needed for chest pain. 25 tablet 3   Probiotic Product (PROBIOTIC PO) Take 1 tablet by mouth every morning.     psyllium (METAMUCIL) 58.6 % packet Take 1 packet by mouth every morning.     spironolactone (ALDACTONE) 25 MG tablet Take 0.5 tablets (12.5 mg total) by mouth daily. 30 tablet 3   warfarin (COUMADIN) 5 MG  tablet Take 1 tablet to 1 and 1/2 tablets by mouth daily or as directed by the coumadin clinic. 45 tablet 0   No current facility-administered medications for this visit.       Physical Exam: BP 125/84 (BP  Location: Left Arm, Patient Position: Sitting)   Pulse 98   Resp 20   Ht 5\' 11"  (1.803 m)   Wt 145 lb (65.8 kg)   SpO2 100% Comment: RA  BMI 20.22 kg/m   General appearance: alert, cooperative, and no distress Heart: regular rate and rhythm, no rub, and no murmur Lungs: clear to auscultation bilaterally Abdomen: benign Extremities: no edema Wound: incis well healed   Diagnostic Studies & Laboratory data:     Recent Radiology Findings:   DG Chest 2 View  Result Date: 08/02/2020 CLINICAL DATA:  Status post CABG EXAM: CHEST - 2 VIEW COMPARISON:  07/05/2020 FINDINGS: Cardiomegaly status post median sternotomy and CABG with valvular annulus prosthesis. Left atrial appendage clip. Near complete interval resolution of previously seen left pleural effusion, now trace. The lungs are normally aerated. The osseous structures are unremarkable. IMPRESSION: Near complete interval resolution of previously seen left pleural effusion, now trace. Electronically Signed   By: Eddie Candle M.D.   On: 08/02/2020 14:05      Recent Lab Findings: Lab Results  Component Value Date   WBC 7.1 07/05/2020   HGB 10.7 (L) 07/05/2020   HCT 31.7 (L) 07/05/2020   PLT 395 07/05/2020   GLUCOSE 120 (H) 07/15/2020   CHOL 196 05/29/2020   TRIG 92 05/29/2020   HDL 71 05/29/2020   LDLCALC 107 (H) 05/29/2020   ALT 20 06/09/2020   AST 23 06/09/2020   NA 137 07/15/2020   K 4.3 07/15/2020   CL 98 07/15/2020   CREATININE 0.95 07/15/2020   BUN 19 07/15/2020   CO2 23 07/15/2020   INR 1.9 (A) 07/27/2020   HGBA1C 5.2 06/09/2020      Assessment / Plan: The patient has made excellent postsurgical recovery.  He will continue to follow-up long-term with both general cardiology as well as EP.  His chest x-ray  was was reviewed and shows significant improvement in previous left effusion and his overall well worrisome findings.  I made no changes to his current medical regimen.  We will see again on a as needed basis for any surgically related needs or as requested.      Medication Changes: No orders of the defined types were placed in this encounter.     John Giovanni, PA-C  08/02/2020 2:09 PM

## 2020-08-02 NOTE — Assessment & Plan Note (Signed)
Continues to have excellent postsurgical recovery.

## 2020-08-02 NOTE — Patient Instructions (Signed)
Activity progression discussed and patient has an excellent understanding.

## 2020-08-03 ENCOUNTER — Telehealth: Payer: Self-pay

## 2020-08-03 DIAGNOSIS — R002 Palpitations: Secondary | ICD-10-CM | POA: Diagnosis not present

## 2020-08-03 NOTE — Telephone Encounter (Signed)
Spoke with Raquel Sarna from [facility] who states that pt wore zio patch 07/19/20-07/26/20. Pt had 2 episodes of complete heart block or high grade AV block lasting 16 sec. Pt also had a run of V tach.

## 2020-08-04 DIAGNOSIS — Z955 Presence of coronary angioplasty implant and graft: Secondary | ICD-10-CM | POA: Diagnosis not present

## 2020-08-04 DIAGNOSIS — Z9889 Other specified postprocedural states: Secondary | ICD-10-CM | POA: Diagnosis not present

## 2020-08-04 DIAGNOSIS — Z952 Presence of prosthetic heart valve: Secondary | ICD-10-CM | POA: Diagnosis not present

## 2020-08-06 ENCOUNTER — Other Ambulatory Visit: Payer: Self-pay | Admitting: *Deleted

## 2020-08-06 DIAGNOSIS — Z955 Presence of coronary angioplasty implant and graft: Secondary | ICD-10-CM | POA: Diagnosis not present

## 2020-08-06 DIAGNOSIS — Z9889 Other specified postprocedural states: Secondary | ICD-10-CM | POA: Diagnosis not present

## 2020-08-06 DIAGNOSIS — Z952 Presence of prosthetic heart valve: Secondary | ICD-10-CM | POA: Diagnosis not present

## 2020-08-06 NOTE — Patient Outreach (Signed)
[facility]) Care Management  08/06/2020  BAUDELIO KARNES December 01, 1945 [id number]   Telephone Assessment-Unsuccessful-Valve Repair (Other)  RN attempted outreach call however unsuccessful. RN able to leave a HIPAA approved voice message requesting a call back.  Will follow up in a few weeks for ongoing [facility] services. Will also send outreach letter and await a response.  Raina Mina, RN Care Management Coordinator [place] Office [phone]

## 2020-08-09 ENCOUNTER — Encounter: Payer: Self-pay | Admitting: Thoracic Surgery (Cardiothoracic Vascular Surgery)

## 2020-08-09 DIAGNOSIS — Z952 Presence of prosthetic heart valve: Secondary | ICD-10-CM | POA: Diagnosis not present

## 2020-08-09 DIAGNOSIS — Z955 Presence of coronary angioplasty implant and graft: Secondary | ICD-10-CM | POA: Diagnosis not present

## 2020-08-09 DIAGNOSIS — Z9889 Other specified postprocedural states: Secondary | ICD-10-CM | POA: Diagnosis not present

## 2020-08-11 ENCOUNTER — Other Ambulatory Visit: Payer: Self-pay

## 2020-08-11 ENCOUNTER — Ambulatory Visit (INDEPENDENT_AMBULATORY_CARE_PROVIDER_SITE_OTHER): Payer: Medicare Other | Admitting: Cardiology

## 2020-08-11 ENCOUNTER — Encounter: Payer: Self-pay | Admitting: Cardiology

## 2020-08-11 VITALS — BP 98/68 | HR 98 | Ht 71.0 in | Wt 152.0 lb

## 2020-08-11 DIAGNOSIS — I255 Ischemic cardiomyopathy: Secondary | ICD-10-CM

## 2020-08-11 DIAGNOSIS — Z951 Presence of aortocoronary bypass graft: Secondary | ICD-10-CM | POA: Diagnosis not present

## 2020-08-11 DIAGNOSIS — Z8679 Personal history of other diseases of the circulatory system: Secondary | ICD-10-CM

## 2020-08-11 DIAGNOSIS — Z950 Presence of cardiac pacemaker: Secondary | ICD-10-CM

## 2020-08-11 DIAGNOSIS — Z9889 Other specified postprocedural states: Secondary | ICD-10-CM

## 2020-08-11 DIAGNOSIS — Z955 Presence of coronary angioplasty implant and graft: Secondary | ICD-10-CM | POA: Diagnosis not present

## 2020-08-11 DIAGNOSIS — Z952 Presence of prosthetic heart valve: Secondary | ICD-10-CM | POA: Diagnosis not present

## 2020-08-11 NOTE — Progress Notes (Signed)
Cardiology Office Note:    Date:  08/11/2020   ID:  Ian Mcgee, DOB 10-15-1945, MRN [id number]  PCP:  Mateo Flow, MD  Cardiologist:  Jenne Campus, MD    Referring MD: Mateo Flow, MD   Chief Complaint  Patient presents with   Follow-up  Am doing fine  History of Present Illness:    Ian Mcgee is a 75 y.o. male   with past medical history significant for coronary artery disease, cardiomyopathy, peripheral vascular disease.  I been following for years and recently he developed diminished left ventricle ejection fraction on top of that he started having some more symptoms and up having cardiac catheterization done which showed significant right coronary artery lesion as well as significant LAD and diagonal branch lesion.  He was also found to have severe mitral regurgitation.  Eventually decision has been made to pursue surgical intervention.  His past medical history is also significant for BiV pacing initially he got DDD pacemaker but then developed cardiomyopathy in his system was upgraded to BiV pacing that happened in 2019.  After that he showed improvement left ventricle ejection fraction, however then later his ejection fraction deteriorated which we felt was related to ischemia.  On 06/02/2020 he underwent triple vessel bypass graft with LIMA to LAD, SVG to posterior descending artery, SVG to diagonal branch.  He also got mitral valve ring placed which is 28 mm Edwards ring, Maze procedure was done at that time and clipping of left atrial appendage was performed.  He is post surgical recovery time was complicated by acute renal failure which was less likely ATN, he recovered completely from that. I will start doing better.  Still start being more active.  He is able to walk with not much shortness of breath overall in good spirits.  Denies have any dizziness or passing out.  He is not much more concerned about the irregularity in fast or slow heartbeats.  Past Medical  History:  Diagnosis Date   Acute on chronic systolic heart failure (HCC)    Attention deficit disorder (ADD)    Cardiac pacemaker 05/10/2017   Cardiomyopathy ([place])    Chest pain 06/05/2020   Chronic apical periodontitis    Complete heart block ([place]) 06/10/2015   Coronary artery disease    40% left main, 50% LAD, 50% RCA recent cardiac catheterization from March 2019   Dyslipidemia    Encounter for preoperative dental examination    Essential hypertension    Gingival recession, generalized    Gingivitis    Hyperlipidemia    Long term (current) use of anticoagulants 06/22/2020   Mitral regurgitation    NSTEMI (non-ST elevated myocardial infarction) ([place]) 06/06/2020   Pacemaker reprogramming/check 06/10/2015   Peripheral vascular disease ([place])    Noncritical bilateral carotid arterial disease   Pneumonia    S/P CABG x 3 06/10/2020   LIMA to LAD, SVG to Diag, SVG to PDA   S/P Maze operation for atrial fibrillation 06/10/2020   Left atrial lesion set using bipolar radiofrequency and cryothermy ablation with clipping of LA appendage   S/P mitral valve repair 06/10/2020   Annuloplasty with a M28 mm Model 4100  Bertell Maria Pitcairn  SN: [id number]     Status post biventricular pacemaker 07/16/2017   Syncope and collapse    Teeth missing     Past Surgical History:  Procedure Laterality Date   Basel Cell Cancer     BIV UPGRADE N/A 06/25/2017   Procedure: BIV  UPGRADE;  Surgeon: Constance Haw, MD;  Location: [place] [facility];  Service: Cardiovascular;  Laterality: N/A;   BUBBLE STUDY  06/03/2020   Procedure: BUBBLE STUDY;  Surgeon: [name], MD;  Location: [place];  Service: Cardiology;;   CATARACT EXTRACTION     CLIPPING OF ATRIAL APPENDAGE  06/10/2020   Procedure: CLIPPING OF ATRIAL APPENDAGE USING  ATRICURE PRO2 CLIP SIZE 45MM;  Surgeon: Rexene Alberts, MD;  Location: [facility] OR;  Service: Open Heart Surgery;;   CORONARY ARTERY BYPASS GRAFT N/A 06/10/2020    Procedure: CORONARY ARTERY BYPASS GRAFTING (CABG) X THREE USING LEFT INTERNAL MAMMARY ARTERY AND BILATERAL LEGS GREATER SAPHENOUS VEIN HARVESTED ENDOSCOPICALLY;  Surgeon: Rexene Alberts, MD;  Location: [place];  Service: Open Heart Surgery;  Laterality: N/A;   INSERT / REPLACE / REMOVE PACEMAKER     INTRAVASCULAR PRESSURE WIRE/FFR STUDY N/A 05/28/2020   Procedure: INTRAVASCULAR PRESSURE WIRE/FFR STUDY;  Surgeon: [name], MD;  Location: [place] [facility];  Service: Cardiovascular;  Laterality: N/A;   KNEE SURGERY Right    ACL Reconstruction   LEFT HEART CATH AND CORONARY ANGIOGRAPHY N/A 05/10/2017   Procedure: LEFT HEART CATH AND CORONARY ANGIOGRAPHY;  Surgeon: [name], MD;  Location: [place] [facility];  Service: Cardiovascular;  Laterality: N/A;   MAZE N/A 06/10/2020   Procedure: MAZE;  Surgeon: Rexene Alberts, MD;  Location: [place];  Service: Open Heart Surgery;  Laterality: N/A;   MITRAL VALVE REPAIR N/A 06/10/2020   Procedure: MITRAL VALVE REPAIR USING CARPENTIER MCCARTHY-ADAMS RING SIZE 28MM;  Surgeon: Rexene Alberts, MD;  Location: [place];  Service: Open Heart Surgery;  Laterality: N/A;   RIGHT/LEFT HEART CATH AND CORONARY ANGIOGRAPHY N/A 05/28/2020   Procedure: RIGHT/LEFT HEART CATH AND CORONARY ANGIOGRAPHY;  Surgeon: [name], MD;  Location: [place] [facility];  Service: Cardiovascular;  Laterality: N/A;   TEE WITHOUT CARDIOVERSION N/A 06/03/2020   Procedure: TRANSESOPHAGEAL ECHOCARDIOGRAM (TEE);  Surgeon: [name], MD;  Location: [place];  Service: Cardiology;  Laterality: N/A;   TEE WITHOUT CARDIOVERSION N/A 06/10/2020   Procedure: TRANSESOPHAGEAL ECHOCARDIOGRAM (TEE);  Surgeon: Rexene Alberts, MD;  Location: [place];  Service: Open Heart Surgery;  Laterality: N/A;   VASECTOMY      Current Medications: Current Meds  Medication Sig   aspirin EC 81 MG EC tablet Take 1 tablet (81 mg total) by mouth daily. Swallow whole.   atorvastatin (LIPITOR) 40 MG  tablet Take 1 tablet (40 mg total) by mouth daily.   B-12, Methylcobalamin, 1000 MCG SUBL Place 1,000 mcg under the tongue every morning.   furosemide (LASIX) 40 MG tablet Take 40 mg by mouth daily.   losartan (COZAAR) 25 MG tablet Take 1 tablet (25 mg total) by mouth daily.   metoprolol succinate (TOPROL XL) 25 MG 24 hr tablet Take 0.5 tablets (12.5 mg total) by mouth at bedtime.   Multiple Vitamin (MULTIVITAMIN ADULT PO) Take 1 tablet by mouth daily in the afternoon. Unknown strength   nitroGLYCERIN (NITROSTAT) 0.4 MG SL tablet Place 1 tablet (0.4 mg total) under the tongue every 5 (five) minutes x 3 doses as needed for chest pain.   Probiotic Product (PROBIOTIC PO) Take 1 tablet by mouth every morning. Unknown strength   psyllium (METAMUCIL) 58.6 % packet Take 1 packet by mouth every morning.   spironolactone (ALDACTONE) 25 MG tablet Take 0.5 tablets (12.5 mg total) by mouth daily.   warfarin (COUMADIN) 5 MG tablet  Take 1 tablet to 1 and 1/2 tablets by mouth daily or as directed by the coumadin clinic. (Patient taking differently: Take 7.5 mg by mouth one time only at 4 PM. Take 1 tablet to 1 and 1/2 tablets by mouth daily or as directed by the coumadin clinic.)     Allergies:   Ciprofloxacin, Crestor [rosuvastatin calcium], and Nexletol [bempedoic acid]   Social History   Socioeconomic History   Marital status: Married    Spouse name: Not on file   Number of children: Not on file   Years of education: Not on file   Highest education level: Not on file  Occupational History   Occupation: Engineer, drilling  Tobacco Use   Smoking status: Former    Pack years: 0.00    Types: Cigarettes   Smokeless tobacco: Never  Vaping Use   Vaping Use: Never used  Substance and Sexual Activity   Alcohol use: Yes    Comment: Wine with dinner   Drug use: Never   Sexual activity: Not on file  Other Topics Concern   Not on file  Social History Narrative   Not on file   Social  Determinants of Health   Financial Resource Strain: Not on file  Food Insecurity: No Food Insecurity   Worried About Charity fundraiser in the Last Year: Never true   [place] in the Last Year: Never true  Transportation Needs: No Transportation Needs   Lack of Transportation (Medical): No   Lack of Transportation (Non-Medical): No  Physical Activity: Not on file  Stress: Not on file  Social Connections: Not on file     Family History: The patient's family history includes CAD in his brother, father, and mother; Healthy in his brother; Hypertension in his mother; Leukemia in his father; Osteoporosis in his mother; Ovarian cancer in his sister; Rheumatic fever in his brother. ROS:   Please see the history of present illness.    All 14 point review of systems negative except as described per history of present illness  EKGs/Labs/Other Studies Reviewed:      Recent Labs: 06/09/2020: ALT 20 06/15/2020: B Natriuretic Peptide 492.8; Magnesium 2.2 07/05/2020: Hemoglobin 10.7; NT-Pro BNP 2,139; Platelets 395 07/15/2020: BUN 19; Creatinine, Ser 0.95; Potassium 4.3; Sodium 137  Recent Lipid Panel    Component Value Date/Time   CHOL 196 05/29/2020 0100   TRIG 92 05/29/2020 0100   HDL 71 05/29/2020 0100   CHOLHDL 2.8 05/29/2020 0100   VLDL 18 05/29/2020 0100   LDLCALC 107 (H) 05/29/2020 0100    Physical Exam:    VS:  BP 98/68 (BP Location: Right Arm, Patient Position: Sitting)   Pulse 98   Ht 5\' 11"  (1.803 m)   Wt 152 lb (68.9 kg)   SpO2 97%   BMI 21.20 kg/m     Wt Readings from Last 3 Encounters:  08/11/20 152 lb (68.9 kg)  08/02/20 145 lb (65.8 kg)  07/20/20 151 lb 3.2 oz (68.6 kg)     GEN:  Well nourished, well developed in no acute distress HEENT: Normal NECK: No JVD; No carotid bruits LYMPHATICS: No lymphadenopathy CARDIAC: RRR, no murmurs, no rubs, no gallops RESPIRATORY:  Clear to auscultation without rales, wheezing or rhonchi  ABDOMEN: Soft, non-tender,  non-distended MUSCULOSKELETAL:  No edema; No deformity  SKIN: Warm and dry LOWER EXTREMITIES: no swelling NEUROLOGIC:  Alert and oriented x 3 PSYCHIATRIC:  Normal affect   ASSESSMENT:    1. S/P CABG  x 3   2. S/P Maze operation for atrial fibrillation   3. S/P mitral valve repair   4. Status post biventricular pacemaker   5. Ischemic cardiomyopathy    PLAN:    In order of problems listed above:  Assess for coronary artery bypass grafting well from that point review continue present management. Status post maze procedure.  No recent reactivation of atrial fibrillation. Status post mitral valve repair repair seems stable last echocardiogram showed no significant regurgitation Status post BiV pacemaker care.  Followed by EP team stable Ischemic cardiomyopathy ejection fraction still low 30 to 35% but need to wait for at least 3 months of appropriate medical therapy after intervention before considering more advanced therapy like consideration of ICD.  He did wear a monitor which showed some PVCs interestingly when he triggered events there were some PVCs present also when he noted some bradycardia on his sports watch he was not bradycardic.  Therefore, measurements done by the device is not accurate.  He did have 1 short run of what appears to be nonsustained ventricular tachycardia somewhat strange looking, on top of that there was one episode that I suspect the pacemaker was doing some parameters check I will talk to pacemaker rep about interpretation of this rhythm strip. He is being anticoagulated Coumadin.  However in 3 months after surgery I think can switch from Coumadin to Eliquis. I gave him samples today ask him not to do it until will establish that this is the proper course of action.  Indication for anticoagulation is atrial fibrillation.  I will also question EP team for indication for anticoagulation in this case since he did have maze procedure done   Medication  Adjustments/Labs and Tests Ordered: Current medicines are reviewed at length with the patient today.  Concerns regarding medicines are outlined above.  No orders of the defined types were placed in this encounter.  Medication changes: No orders of the defined types were placed in this encounter.   Signed, Park Liter, MD, [facility] 08/11/2020 4:30 PM    [place]

## 2020-08-11 NOTE — Patient Instructions (Signed)
Medication Instructions:  Your physician recommends that you continue on your current medications as directed. Please refer to the Current Medication list given to you today.  *If you need a refill on your cardiac medications before your next appointment, please call your pharmacy*   Lab Work: none If you have labs (blood work) drawn today and your tests are completely normal, you will receive your results only by: [place] (if you have MyChart) OR A paper copy in the mail If you have any lab test that is abnormal or we need to change your treatment, we will call you to review the results.   Testing/Procedures: None   Follow-Up: At [facility], you and your health needs are our priority.  As part of our continuing mission to provide you with exceptional heart care, we have created designated Provider Care Teams.  These Care Teams include your primary Cardiologist (physician) and Advanced Practice Providers (APPs -  Physician Assistants and Nurse Practitioners) who all work together to provide you with the care you need, when you need it.  We recommend signing up for the patient portal called "MyChart".  Sign up information is provided on this After Visit Summary.  MyChart is used to connect with patients for Virtual Visits (Telemedicine).  Patients are able to view lab/test results, encounter notes, upcoming appointments, etc.  Non-urgent messages can be sent to your provider as well.   To learn more about what you can do with MyChart, go to [email or web address].    Your next appointment:   3 month(s)  The format for your next appointment:   In Person  Provider:   Jenne Campus, MD   Other Instructions

## 2020-08-12 ENCOUNTER — Encounter (HOSPITAL_COMMUNITY): Payer: Self-pay | Admitting: Internal Medicine

## 2020-08-12 ENCOUNTER — Ambulatory Visit (HOSPITAL_COMMUNITY)
Admission: RE | Admit: 2020-08-12 | Discharge: 2020-08-12 | Disposition: A | Discharge status: Home / Self Care | Payer: Medicare Other | Source: Ambulatory Visit | Attending: Internal Medicine | Admitting: Internal Medicine

## 2020-08-12 VITALS — BP 118/77 | HR 95 | Wt 153.4 lb

## 2020-08-12 DIAGNOSIS — I5022 Chronic systolic (congestive) heart failure: Secondary | ICD-10-CM | POA: Diagnosis not present

## 2020-08-12 DIAGNOSIS — Z951 Presence of aortocoronary bypass graft: Secondary | ICD-10-CM

## 2020-08-12 DIAGNOSIS — I2510 Atherosclerotic heart disease of native coronary artery without angina pectoris: Secondary | ICD-10-CM | POA: Insufficient documentation

## 2020-08-12 MED ORDER — FUROSEMIDE 20 MG PO TABS: 20.0000 mg | ORAL_TABLET | ORAL | 6 refills | Status: DC | PRN

## 2020-08-12 MED ORDER — EMPAGLIFLOZIN 10 MG PO TABS: 10.0000 mg | ORAL_TABLET | Freq: Every day | ORAL | 11 refills | Status: DC

## 2020-08-12 NOTE — Progress Notes (Signed)
ADVANCED HF CLINIC CONSULT NOTE  Referring Physician: Jenne Campus Primary Care: Bertram Millard Primary Cardiologist: Jenne Campus  HPI:  Mr. Mccombie is a 75 y.o. male (former CEO of [employer]) with CAD s/p CABG 4/22, EF 45% and h/o CHB s/p BiV, PAD/Carotid dx 60%, multiple medications intolerances.   Followed by Dr. Agustin Cree for years for moderate MR, CAD, complete heart block s/p PPM.  Began having decreased exercise capacity in 2019.  At that time had an echo with EF 40% and anterior wall akinesis. Given his symptoms and echo findings he underwent LHC which demonstrated diffuse moderate to severe RCA and LAD disease, severe MR and the thought was that given LHC findings his cardiomyopathy was due to pacemaker syndrome.  Underwent Biv upgrade 5/19   Had worsening DOE in 4/22. 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  Subsequently underwent CABG x3 and MVR with maze procedure given history of PAF/ AFL as well as LAA clip.  Did have post op AKI which improved by discharge.  Last visit he was here for post hospital follow up and having symptomatic hypotension was dry on exam, switched lasix to 40mg  PRN, switched losartan to 25mg  QHS and spiro was decreased to 12.5mg .    Did develop some edema after last visit and lasix was restarted by Dr. Agustin Cree. Had a repeat ECHO 07/23/20  EF 30-35%, normal RV function, MR trivial, mild to mod MS, hypokinetic apex.  Saw EP having Atial tach but mostly HR 100 or less, he was switched from carvedilol to toprol XL QHS for less bp effect.  At follow up with Dr. Agustin Cree lasix decreased to 20mg  yesterday.  BP has been well controlled at home.  Some dizziness after exercise but overall dizziness  and light headedness has improved and he knows how to manage it better.  Weight has been stable at 147 at home.     Past Medical History:  Diagnosis Date   Acute on chronic systolic heart failure (HCC)    Attention deficit disorder (ADD)    Cardiac pacemaker 05/10/2017   Cardiomyopathy ([place])    Chest pain 06/05/2020   Chronic apical periodontitis    Complete heart block ([place]) 06/10/2015   Coronary artery disease    40% left main, 50% LAD, 50% RCA recent cardiac catheterization from March 2019   Dyslipidemia    Encounter for preoperative dental examination    Essential hypertension    Gingival recession, generalized    Gingivitis    Hyperlipidemia    Long term (current) use of anticoagulants 06/22/2020   Mitral regurgitation    NSTEMI (non-ST elevated myocardial infarction) ([place]) 06/06/2020   Pacemaker reprogramming/check 06/10/2015   Peripheral vascular disease ([place])    Noncritical bilateral carotid arterial disease   Pneumonia    S/P CABG x 3 06/10/2020   LIMA to LAD, SVG to Diag, SVG to PDA   S/P Maze operation for atrial fibrillation 06/10/2020   Left atrial lesion set using bipolar radiofrequency and cryothermy ablation with clipping of LA appendage   S/P mitral valve repair 06/10/2020   Annuloplasty with a M28 mm Model 4100  Bertell Maria [place]  SN: [id number]     Status post biventricular pacemaker 07/16/2017  Syncope and collapse    Teeth missing     Current Outpatient Medications  Medication Sig Dispense Refill   aspirin EC 81 MG EC tablet Take 1 tablet (81 mg total) by mouth daily. Swallow whole. 30 tablet 11   atorvastatin (LIPITOR) 40 MG tablet Take 1 tablet (40 mg total) by mouth daily. 30 tablet 3   B-12, Methylcobalamin, 1000 MCG SUBL Place 1,000 mcg under the tongue every morning.     furosemide (LASIX) 20 MG tablet Take 20 mg by mouth daily.     losartan (COZAAR) 25 MG tablet Take 1 tablet (25 mg total) by mouth daily. 30 tablet 3   metoprolol succinate  (TOPROL XL) 25 MG 24 hr tablet Take 0.5 tablets (12.5 mg total) by mouth at bedtime. 45 tablet 2   Multiple Vitamin (MULTIVITAMIN ADULT PO) Take 1 tablet by mouth daily in the afternoon. Unknown strength     nitroGLYCERIN (NITROSTAT) 0.4 MG SL tablet Place 1 tablet (0.4 mg total) under the tongue every 5 (five) minutes x 3 doses as needed for chest pain. 25 tablet 3   Probiotic Product (PROBIOTIC PO) Take 1 tablet by mouth every morning. Unknown strength     psyllium (METAMUCIL) 58.6 % packet Take 1 packet by mouth every morning.     spironolactone (ALDACTONE) 25 MG tablet Take 0.5 tablets (12.5 mg total) by mouth daily. 30 tablet 3   warfarin (COUMADIN) 5 MG tablet Take 1 tablet to 1 and 1/2 tablets by mouth daily or as directed by the coumadin clinic. (Patient taking differently: Take 7.5 mg by mouth one time only at 4 PM. Take 1 tablet to 1 and 1/2 tablets by mouth daily or as directed by the coumadin clinic.) 45 tablet 0   No current facility-administered medications for this encounter.    Allergies  Allergen Reactions   Ciprofloxacin Other (See Comments)    Bleeding (intolerance)   Crestor [Rosuvastatin Calcium] Other (See Comments)    Muscle twinges   Nexletol [Bempedoic Acid] Other (See Comments)    Muscle and joint pain      Social History   Socioeconomic History   Marital status: Married    Spouse name: Not on file   Number of children: Not on file   Years of education: Not on file   Highest education level: Not on file  Occupational History   Occupation: Engineer, drilling  Tobacco Use   Smoking status: Former    Pack years: 0.00    Types: Cigarettes   Smokeless tobacco: Never  Vaping Use   Vaping Use: Never used  Substance and Sexual Activity   Alcohol use: Yes    Comment: Wine with dinner   Drug use: Never   Sexual activity: Not on file  Other Topics Concern   Not on file  Social History Narrative   Not on file   Social Determinants of Health    Financial Resource Strain: Not on file  Food Insecurity: No Food Insecurity   Worried About Charity fundraiser in the Last Year: Never true   [place] in the Last Year: Never true  Transportation Needs: No Transportation Needs   Lack of Transportation (Medical): No   Lack of Transportation (Non-Medical): No  Physical Activity: Not on file  Stress: Not on file  Social Connections: Not on file  Intimate Partner Violence: Not on file      Family History  Problem Relation Age of Onset   CAD Mother  Hypertension Mother    Osteoporosis Mother    CAD Father    Leukemia Father    Ovarian cancer Sister    Healthy Brother    CAD Brother    Rheumatic fever Brother     There were no vitals filed for this visit.   PHYSICAL EXAM: General:  Well appearing. No respiratory difficulty HEENT: normal Neck: supple. no JVD. Carotids 2+ bilat; no bruits. No lymphadenopathy or thryomegaly appreciated. Cor: PMI nondisplaced. Regular rate & rhythm. No rubs, gallops or murmurs. Lungs: clear Abdomen: soft, nontender, nondistended. No hepatosplenomegaly. No bruits or masses. Good bowel sounds. Extremities: no cyanosis, clubbing, rash, edema Neuro: alert & oriented x 3, cranial nerves grossly intact. moves all 4 extremities w/o difficulty. Affect pleasant.   ASSESSMENT & PLAN: 1. Chronic systolic HF: -Patient has history of nonischemic cardiomyopathy with LVEF 35-40% 2019. S/p BiV PPM placement with improvement in EF . Now likely an element of ischemic cardiomyopathy given progression of CAD -ECHO 05/2020 with EF 40%, moderate-severe MR, normal RV function, G2DD. -LHC 05/2020 with multivessel disease now s/p CABG x3 and MVR with maze procedure -06/16/20 ECHO EF 25-30%  -ECHO 07/23/20  EF 30-35%, normal RV function, MR trivial, mild to mod MS, hypokinetic apex -Currently NYHA class II symptoms, volume status okay -Currently taking Toprol XL 12.5mg  BID, losartan 25mg  QHS, spiro 12.5 daily,  lasix 20mg  daily -Start Jardiance 10, switch lasix to PRN -Seen by EP, no real ability to improve V-V optimization   2. Severe MR s/p MV Repair: -No residual MR on post repair TEE -On warfarin recent dose increase subtherapeutic no bleeding   3. Multivessel CAD s/p CABG: -s/p 3v CABG with Dr. Roxy Manns 06/10/20 -Continue ASA 81mg , warfarin and atorvastatin 40mg  daily -Continue coreg 3.125mg  BID   4. Paroxysmal Afib/flutter: -S/p MAZE procedure with LAA clipping. -Continue Toprol XL -Continued on warfarin   5. History of CHB: -S/p BiV PPM placement -Interrogated today 98% Atrial sense BIV paced  Vickki Muff, MD   Katherine Roan, MD  10:53 AM   Patient seen and examined with the above-signed Advanced Practice Provider and/or Housestaff. I personally reviewed laboratory data, imaging studies and relevant notes. I independently examined the patient and formulated the important aspects of the plan. I have edited the note to reflect any of my changes or salient points. I have personally discussed the plan with the patient and/or family.  Continues to improve. NYHA II. Volumes status looks good. Tolerating meds. Recent echo reviewed personally. EF 30-35%.   General:  Well appearing. No resp difficulty HEENT: normal Neck: supple. no JVD. Carotids 2+ bilat; no bruits. No lymphadenopathy or thryomegaly appreciated. Cor: PMI nondisplaced. Regular rate & rhythm. No rubs, gallops or murmurs. Lungs: clear Abdomen: soft, nontender, nondistended. No hepatosplenomegaly. No bruits or masses. Good bowel sounds. Extremities: no cyanosis, clubbing, rash, edema Neuro: alert & orientedx3, cranial nerves grossly intact. moves all 4 extremities w/o difficulty. Affect pleasant  Improving. Switch lasix to Jardiance. He can f/u with Dr. Agustin Cree. If EF remains < 35% will need to consider upgrade of device to CRT-D. Labs today.   Glori Bickers, MD  10:46 PM

## 2020-08-12 NOTE — Patient Instructions (Signed)
START Jardiance 10 mg, one tab daily CHANGE Lasix to 20 mg as needed for weight gain or swelling   Be sure to schedule follow up with [name]  Your physician recommends that you schedule a follow-up appointment as needed.  Do the following things EVERYDAY: Weigh yourself in the morning before breakfast. Write it down and keep it in a log. Take your medicines as prescribed Eat low salt foods--Limit salt (sodium) to 2000 mg per day.  Stay as active as you can everyday Limit all fluids for the day to less than 2 liters  At the [facility], you and your health needs are our priority. As part of our continuing mission to provide you with exceptional heart care, we have created designated Provider Care Teams. These Care Teams include your primary Cardiologist (physician) and Advanced Practice Providers (APPs- Physician Assistants and Nurse Practitioners) who all work together to provide you with the care you need, when you need it.   You may see any of the following providers on your designated Care Team at your next follow up: Dr Glori Bickers Dr Loralie Champagne Dr Patrice Paradise, NP Lyda Jester, [place] Ginnie Smart Audry Riles, PharmD   Please be sure to bring in all your medications bottles to every appointment.

## 2020-08-13 DIAGNOSIS — Z9889 Other specified postprocedural states: Secondary | ICD-10-CM | POA: Diagnosis not present

## 2020-08-13 DIAGNOSIS — Z955 Presence of coronary angioplasty implant and graft: Secondary | ICD-10-CM | POA: Diagnosis not present

## 2020-08-13 DIAGNOSIS — Z952 Presence of prosthetic heart valve: Secondary | ICD-10-CM | POA: Diagnosis not present

## 2020-08-17 ENCOUNTER — Ambulatory Visit (INDEPENDENT_AMBULATORY_CARE_PROVIDER_SITE_OTHER): Payer: Medicare Other

## 2020-08-17 ENCOUNTER — Other Ambulatory Visit: Payer: Self-pay

## 2020-08-17 DIAGNOSIS — Z9889 Other specified postprocedural states: Secondary | ICD-10-CM | POA: Diagnosis not present

## 2020-08-17 DIAGNOSIS — Z7901 Long term (current) use of anticoagulants: Secondary | ICD-10-CM

## 2020-08-17 LAB — POCT INR: INR: 1.9 — AB (ref 2.0–3.0)

## 2020-08-17 NOTE — Patient Instructions (Signed)
Take 2 tablets today and then continue taking 1.5 tablets daily. Recheck INR in 4 weeks. [facility] [phone]. Transitioning to Eliquis per [name] TBD.

## 2020-08-18 DIAGNOSIS — Z9889 Other specified postprocedural states: Secondary | ICD-10-CM | POA: Diagnosis not present

## 2020-08-18 DIAGNOSIS — Z952 Presence of prosthetic heart valve: Secondary | ICD-10-CM | POA: Diagnosis not present

## 2020-08-18 DIAGNOSIS — Z955 Presence of coronary angioplasty implant and graft: Secondary | ICD-10-CM | POA: Diagnosis not present

## 2020-08-23 DIAGNOSIS — Z952 Presence of prosthetic heart valve: Secondary | ICD-10-CM | POA: Diagnosis not present

## 2020-08-23 DIAGNOSIS — Z9889 Other specified postprocedural states: Secondary | ICD-10-CM | POA: Diagnosis not present

## 2020-08-23 DIAGNOSIS — Z955 Presence of coronary angioplasty implant and graft: Secondary | ICD-10-CM | POA: Diagnosis not present

## 2020-08-25 DIAGNOSIS — Z952 Presence of prosthetic heart valve: Secondary | ICD-10-CM | POA: Diagnosis not present

## 2020-08-25 DIAGNOSIS — Z9889 Other specified postprocedural states: Secondary | ICD-10-CM | POA: Diagnosis not present

## 2020-08-25 DIAGNOSIS — Z955 Presence of coronary angioplasty implant and graft: Secondary | ICD-10-CM | POA: Diagnosis not present

## 2020-08-27 DIAGNOSIS — Z955 Presence of coronary angioplasty implant and graft: Secondary | ICD-10-CM | POA: Diagnosis not present

## 2020-08-27 DIAGNOSIS — Z9889 Other specified postprocedural states: Secondary | ICD-10-CM | POA: Diagnosis not present

## 2020-08-27 DIAGNOSIS — Z952 Presence of prosthetic heart valve: Secondary | ICD-10-CM | POA: Diagnosis not present

## 2020-08-30 DIAGNOSIS — Z952 Presence of prosthetic heart valve: Secondary | ICD-10-CM | POA: Diagnosis not present

## 2020-08-30 DIAGNOSIS — Z955 Presence of coronary angioplasty implant and graft: Secondary | ICD-10-CM | POA: Diagnosis not present

## 2020-08-30 DIAGNOSIS — Z9889 Other specified postprocedural states: Secondary | ICD-10-CM | POA: Diagnosis not present

## 2020-09-01 ENCOUNTER — Telehealth: Payer: Self-pay

## 2020-09-01 DIAGNOSIS — Z952 Presence of prosthetic heart valve: Secondary | ICD-10-CM | POA: Diagnosis not present

## 2020-09-01 DIAGNOSIS — Z9889 Other specified postprocedural states: Secondary | ICD-10-CM | POA: Diagnosis not present

## 2020-09-01 DIAGNOSIS — Z955 Presence of coronary angioplasty implant and graft: Secondary | ICD-10-CM | POA: Diagnosis not present

## 2020-09-01 NOTE — Telephone Encounter (Signed)
I spoke with Ian Mcgee and he has an appointment with Dr. Curt Bears 09/02/20. I have [name] with [employer] scheduled to progam his pacemaker 09/03/20 at 1:00 in [place]. Pt wants to make sure we are all on the same page. I told him that I will let everyone know and if the plans need to change then we will do that after your appointment.

## 2020-09-01 NOTE — Telephone Encounter (Signed)
Left a VM for Bryan to callback to setup a time for pt to get his pacemaker reprogrammed.

## 2020-09-01 NOTE — Telephone Encounter (Signed)
[name] from [place] returning call.

## 2020-09-01 NOTE — Telephone Encounter (Signed)
-----   Message from Simone Curia, RN sent at 08/31/2020  1:04 PM EDT ----- Regarding: RE: [employer] Hi,  You can contact [employer]. And he will get a time with someone to be at the office for assistance. Leave him a VM if he does not answer.  Gaspar Bidding Small [phone]  Thanks, Marliss Czar ----- Message ----- From: Truddie Hidden, RN Sent: 08/31/2020  12:34 PM EDT To: [facility] Subject: [employer]                                         Can anyone provide me with some guidance on how to get this request done. Thanks Reuben Likes ----- Message ----- From: Park Liter, MD Sent: 08/31/2020  12:03 PM EDT To: Truddie Hidden, RN  Reuben Likes, please do me a favor can you schedule Mr. Slawinski to have his pacemaker reprogrammed with [employer] rep, please let me know when it will be scheduled.  Lets do it in [place] office.  And if we can make arrangements for this within the next day or 2 that probably will be the best.  Thank you

## 2020-09-01 NOTE — Telephone Encounter (Signed)
[employer] will be here Friday 09/03/20 at 1300 to reprogram his pacemaker. A detailed message has been left for the patient.

## 2020-09-02 ENCOUNTER — Ambulatory Visit (INDEPENDENT_AMBULATORY_CARE_PROVIDER_SITE_OTHER): Payer: Medicare Other | Admitting: Cardiology

## 2020-09-02 ENCOUNTER — Encounter: Payer: Self-pay | Admitting: Cardiology

## 2020-09-02 ENCOUNTER — Other Ambulatory Visit: Payer: Self-pay

## 2020-09-02 VITALS — BP 138/70 | HR 92 | Ht 71.0 in | Wt 156.0 lb

## 2020-09-02 DIAGNOSIS — I255 Ischemic cardiomyopathy: Secondary | ICD-10-CM

## 2020-09-02 DIAGNOSIS — I208 Other forms of angina pectoris: Secondary | ICD-10-CM | POA: Diagnosis not present

## 2020-09-02 NOTE — Progress Notes (Signed)
Electrophysiology Office Note   Date:  09/02/2020   ID:  Ian Mcgee, DOB November 22, 1945, MRN [id number]  PCP:  Ian Flow, MD  Cardiologist:  Ian Mcgee Primary Electrophysiologist:  Ian Shafer Meredith Leeds, MD    No chief complaint on file.    History of Present Illness: Ian Mcgee is a 75 y.o. male who is being seen today for the evaluation of dilated cardiomyopathy at the request of Ian Mcgee. Presenting today for electrophysiology evaluation.    He has a history significant for complete heart block, hyperlipidemia, syncope.  He had an echo that showed an ejection fraction of 35 to 40%.  Cath showed nonobstructive coronary artery disease.  He is now status post CRT-P upgrade with a Ian Mcgee 06/25/2017.  He had worsened dyspnea on exertion April 2022.  He underwent left heart catheterization with severe three-vessel disease and severe MR.  He had a repeat echo that showed an ejection fraction 40%.  He underwent three-vessel CABG with MVR and maze in his history of atrial fibrillation and atrial flutter as well as left atrial appendage clip.  Today, denies symptoms of palpitations, chest pain, shortness of breath, orthopnea, PND, lower extremity edema, claudication, dizziness, presyncope, syncope, bleeding, or neurologic sequela. The patient is tolerating medications without difficulties.  Since his surgery he has been quite improved.  He has been not having chest pain or shortness of breath.  Is able to do all of his daily activities and has not been restricted.  There are certain days that he feels worse than others.  He attributes this to continued recovery from his surgery.   Past Medical History:  Diagnosis Date   Acute on chronic systolic heart failure (HCC)    Attention deficit disorder (ADD)    Cardiac pacemaker 05/10/2017   Cardiomyopathy ([place])    Chest pain 06/05/2020   Chronic apical periodontitis    Complete heart block ([place]) 06/10/2015   Coronary  artery disease    40% left main, 50% LAD, 50% RCA recent cardiac catheterization from March 2019   Dyslipidemia    Encounter for preoperative dental examination    Essential hypertension    Gingival recession, generalized    Gingivitis    Hyperlipidemia    Long term (current) use of anticoagulants 06/22/2020   Mitral regurgitation    NSTEMI (non-ST elevated myocardial infarction) ([place]) 06/06/2020   Pacemaker reprogramming/check 06/10/2015   Peripheral vascular disease ([place])    Noncritical bilateral carotid arterial disease   Pneumonia    S/P CABG x 3 06/10/2020   LIMA to LAD, SVG to Diag, SVG to PDA   S/P Maze operation for atrial fibrillation 06/10/2020   Left atrial lesion set using bipolar radiofrequency and cryothermy ablation with clipping of LA appendage   S/P mitral valve repair 06/10/2020   Annuloplasty with a M28 mm Model 4100  Ian Mcgee  SN: [id number]     Status post biventricular pacemaker 07/16/2017   Syncope and collapse    Teeth missing    Past Surgical History:  Procedure Laterality Date   Basel Cell Cancer     BIV UPGRADE N/A 06/25/2017   Procedure: BIV UPGRADE;  Surgeon: Ian Haw, MD;  Location: [place] [facility];  Service: Cardiovascular;  Laterality: N/A;   BUBBLE STUDY  06/03/2020   Procedure: BUBBLE STUDY;  Surgeon: Ian Munroe, MD;  Location: [facility] ENDOSCOPY;  Service: Cardiology;;   CATARACT EXTRACTION     CLIPPING OF ATRIAL APPENDAGE  06/10/2020   Procedure: CLIPPING OF ATRIAL APPENDAGE USING  ATRICURE PRO2 CLIP SIZE 45MM;  Surgeon: Ian Alberts, MD;  Location: [facility] OR;  Service: Open Heart Surgery;;   CORONARY ARTERY BYPASS GRAFT N/A 06/10/2020   Procedure: CORONARY ARTERY BYPASS GRAFTING (CABG) X THREE USING LEFT INTERNAL MAMMARY ARTERY AND BILATERAL LEGS GREATER SAPHENOUS VEIN HARVESTED ENDOSCOPICALLY;  Surgeon: Ian Alberts, MD;  Location: [place];  Service: Open Heart Surgery;  Laterality: N/A;   INSERT / REPLACE /  REMOVE PACEMAKER     INTRAVASCULAR PRESSURE WIRE/FFR STUDY N/A 05/28/2020   Procedure: INTRAVASCULAR PRESSURE WIRE/FFR STUDY;  Surgeon: Ian Mocha, MD;  Location: [place] [facility];  Service: Cardiovascular;  Laterality: N/A;   KNEE SURGERY Right    ACL Reconstruction   LEFT HEART CATH AND CORONARY ANGIOGRAPHY N/A 05/10/2017   Procedure: LEFT HEART CATH AND CORONARY ANGIOGRAPHY;  Surgeon: Ian Man, MD;  Location: [place] [facility];  Service: Cardiovascular;  Laterality: N/A;   MAZE N/A 06/10/2020   Procedure: MAZE;  Surgeon: Ian Alberts, MD;  Location: [place];  Service: Open Heart Surgery;  Laterality: N/A;   MITRAL VALVE REPAIR N/A 06/10/2020   Procedure: MITRAL VALVE REPAIR USING CARPENTIER MCCARTHY-ADAMS RING SIZE 28MM;  Surgeon: Ian Alberts, MD;  Location: [place];  Service: Open Heart Surgery;  Laterality: N/A;   RIGHT/LEFT HEART CATH AND CORONARY ANGIOGRAPHY N/A 05/28/2020   Procedure: RIGHT/LEFT HEART CATH AND CORONARY ANGIOGRAPHY;  Surgeon: Ian Mocha, MD;  Location: [place] [facility];  Service: Cardiovascular;  Laterality: N/A;   TEE WITHOUT CARDIOVERSION N/A 06/03/2020   Procedure: TRANSESOPHAGEAL ECHOCARDIOGRAM (TEE);  Surgeon: Ian Munroe, MD;  Location: [place];  Service: Cardiology;  Laterality: N/A;   TEE WITHOUT CARDIOVERSION N/A 06/10/2020   Procedure: TRANSESOPHAGEAL ECHOCARDIOGRAM (TEE);  Surgeon: Ian Alberts, MD;  Location: [place];  Service: Open Heart Surgery;  Laterality: N/A;   VASECTOMY       Current Outpatient Medications  Medication Sig Dispense Refill   apixaban (ELIQUIS) 5 MG TABS tablet Take 5 mg by mouth 2 (two) times daily.     aspirin EC 81 MG EC tablet Take 1 tablet (81 mg total) by mouth daily. Swallow whole. 30 tablet 11   atorvastatin (LIPITOR) 40 MG tablet Take 1 tablet (40 mg total) by mouth daily. 30 tablet 3   B-12, Methylcobalamin, 1000 MCG SUBL Place 1,000 mcg under the tongue every morning.     empagliflozin  (JARDIANCE) 10 MG TABS tablet Take 1 tablet (10 mg total) by mouth daily before breakfast. 30 tablet 11   furosemide (LASIX) 20 MG tablet Take 1 tablet (20 mg total) by mouth as needed. 30 tablet 6   losartan (COZAAR) 25 MG tablet Take 1 tablet (25 mg total) by mouth daily. 30 tablet 3   metoprolol succinate (TOPROL XL) 25 MG 24 hr tablet Take 0.5 tablets (12.5 mg total) by mouth at bedtime. 45 tablet 2   Multiple Vitamin (MULTIVITAMIN ADULT PO) Take 1 tablet by mouth daily in the afternoon. Unknown strength     nitroGLYCERIN (NITROSTAT) 0.4 MG SL tablet Place 1 tablet (0.4 mg total) under the tongue every 5 (five) minutes x 3 doses as needed for chest pain. 25 tablet 3   Probiotic Product (PROBIOTIC PO) Take 1 tablet by mouth every morning. Unknown strength     psyllium (METAMUCIL) 58.6 % packet Take 1 packet by mouth every morning.     spironolactone (ALDACTONE) 25 MG  tablet Take 0.5 tablets (12.5 mg total) by mouth daily. 30 tablet 3   No current facility-administered medications for this visit.    Allergies:   Ciprofloxacin, Crestor [rosuvastatin calcium], and Nexletol [bempedoic acid]   Social History:  The patient  reports that he has quit smoking. His smoking use included cigarettes. He has never used smokeless tobacco. He reports current alcohol use. He reports that he does not use drugs.   Family History:  The patient's family history includes CAD in his brother, father, and mother; Healthy in his brother; Hypertension in his mother; Leukemia in his father; Osteoporosis in his mother; Ovarian cancer in his sister; Rheumatic fever in his brother.    ROS:  Please see the history of present illness.   Otherwise, review of systems is positive for none.   All other systems are reviewed and negative.   PHYSICAL EXAM: VS:  BP 138/70   Pulse 92   Ht 5\' 11"  (1.803 m)   Wt 156 lb (70.8 kg)   BMI 21.76 kg/m  , BMI Body mass index is 21.76 kg/m. GEN: Well nourished, well developed, in no  acute distress  HEENT: normal  Neck: no JVD, carotid bruits, or masses Cardiac: RRR; no murmurs, rubs, or gallops,no edema  Respiratory:  clear to auscultation bilaterally, normal work of breathing GI: soft, nontender, nondistended, + BS MS: no deformity or atrophy  Skin: warm and dry, device site well healed Neuro:  Strength and sensation are intact Psych: euthymic mood, full affect  EKG:  EKG is not ordered today. Personal review of the ekg ordered 08/12/20 shows atrial sensed, ventricular paced, rate 100  Personal review of the device interrogation today. Results in [place]: 06/09/2020: ALT 20 06/15/2020: B Natriuretic Peptide 492.8; Magnesium 2.2 07/05/2020: Hemoglobin 10.7; NT-Pro BNP 2,139; Platelets 395 07/15/2020: BUN 19; Creatinine, Ser 0.95; Potassium 4.3; Sodium 137    Lipid Panel     Component Value Date/Time   CHOL 196 05/29/2020 0100   TRIG 92 05/29/2020 0100   HDL 71 05/29/2020 0100   CHOLHDL 2.8 05/29/2020 0100   VLDL 18 05/29/2020 0100   LDLCALC 107 (H) 05/29/2020 0100     Wt Readings from Last 3 Encounters:  09/02/20 156 lb (70.8 kg)  08/12/20 153 lb 6.4 oz (69.6 kg)  08/11/20 152 lb (68.9 kg)      Other studies Reviewed: Additional studies/ records that were reviewed today include: LHC 09/21/17 Review of the above records today demonstrates:  - Left ventricle: The cavity size was normal. Systolic function was   normal. The estimated ejection fraction was in the range of 50%   to 55%. Wall motion was normal; there were no regional wall   motion abnormalities. Doppler parameters are consistent with   abnormal left ventricular relaxation (grade 1 diastolic   dysfunction). - Mitral valve: There was mild regurgitation. - Left atrium: The atrium was mildly dilated.    LHC 05/10/17 Diffuse moderate to severe calcified disease in the RCA as well as LAD.  But no focal lesion to explain regional wall motion normality. Suspect that his reduced  ejection fraction is related to pacemaker syndrome.  Consider cardiac resynchronization therapy.  TTE 04/27/17  1.  Moderate left atrial enlargement 2.  EF 40%.  Severely hypokinetic anterior wall, mid distal septum.  Mid to distal inferior wall akinetic. 3.  Mildly dilated RV and RA with normal pulmonary pressures 4.  Aortic sclerosis without stenosis or regurgitation 5.  Moderate mitral regurgitation  ASSESSMENT AND PLAN:  1.  Dilated cardiomyopathy: Ejection fraction 35 to 40%.  Status post [facility] pacemaker with a CRT-P upgrade 06/25/2017.  Device functioning appropriately.  Have adjusted atrial sensitivity due to episodes of heart block noted on ZIO monitor.  2.  Complete heart block: Status post [employer] CRT-P.  3.  Paroxysmal atrial fibrillation/flutter: Status post maze with left atrial appendage clipping.  Continue Toprol-XL and Eliquis.  4.  Hyperlipidemia: Plan per primary cardiology.  5.  Coronary artery disease: Status post three-vessel CABG 06/10/2020.  Per primary cardiology.  6.  Severe mitral regurgitation: Status post mitral valve repair.  Plan per primary cardiology.  Case discussed with primary cardiology  Current medicines are reviewed at length with the patient today.   The patient does not have concerns regarding his medicines.  The following changes were made today: none  Labs/ tests ordered today include:  No orders of the defined types were placed in this encounter.   Disposition:   FU with Jeneen Doutt 12 months  Signed, Tamer Baughman Meredith Leeds, MD  09/02/2020 3:17 PM     [address] [place] [address] [phone] (office) [phone] (fax)

## 2020-09-03 DIAGNOSIS — Z9889 Other specified postprocedural states: Secondary | ICD-10-CM | POA: Diagnosis not present

## 2020-09-03 DIAGNOSIS — Z952 Presence of prosthetic heart valve: Secondary | ICD-10-CM | POA: Diagnosis not present

## 2020-09-03 DIAGNOSIS — Z955 Presence of coronary angioplasty implant and graft: Secondary | ICD-10-CM | POA: Diagnosis not present

## 2020-09-06 ENCOUNTER — Encounter: Payer: Self-pay | Admitting: *Deleted

## 2020-09-06 ENCOUNTER — Other Ambulatory Visit: Payer: Self-pay | Admitting: *Deleted

## 2020-09-06 ENCOUNTER — Ambulatory Visit: Payer: Medicare Other | Admitting: *Deleted

## 2020-09-06 DIAGNOSIS — Z9889 Other specified postprocedural states: Secondary | ICD-10-CM | POA: Diagnosis not present

## 2020-09-06 DIAGNOSIS — Z955 Presence of coronary angioplasty implant and graft: Secondary | ICD-10-CM | POA: Diagnosis not present

## 2020-09-06 DIAGNOSIS — Z952 Presence of prosthetic heart valve: Secondary | ICD-10-CM | POA: Diagnosis not present

## 2020-09-06 MED ORDER — APIXABAN 5 MG PO TABS: 5.0000 mg | ORAL_TABLET | Freq: Two times a day (BID) | ORAL | 0 refills | Status: DC

## 2020-09-06 NOTE — Patient Outreach (Signed)
[facility]) Care Management  09/06/2020  Ian Mcgee [birth year] [id number]  Telephone Assessment-Successful-Other (Valve Replacement)  RN spoke with pt today and updated the discussed plan of care. Pt continues to do well with no acute issues. Pt reports he is back walking his 6 1/2 miles and continues outings with no acute issues or reports with is family and friends.   Will follow up next month and place pt on quarterly if he continue to do well.    Goals Addressed             This Visit's Progress    [place]ain My Quality of Life   On track    Timeframe:  Long-Range Goal Priority:  Medium Start Date: 06/29/2020                            Expected End Date:  10/13/2020                    Follow Up Date 10/07/2020   - do one enjoyable thing every day - spend time with a child every day, borrow one if I have to - spend time outdoors at least 3 times a week   Barriers: Health Behaviors  Why is this important?   Having a long-term illness can be scary.  It can also be stressful for you and your caregiver.  These steps may help.    Notes:  5/17 Pt has supportive family to assist in the home and has hired help to assist temporary as he continue to recover. Pt continue to follow the post-op discharge instructions and decline review of his medications at this time indicating they have been reviewed several times. Will continue to encourage adherence with his ongoing management of care.      THN-Make and Keep All Appointments   On track    Timeframe:  Short-Term Goal Priority:  Medium Start Date:   06/29/2020                          Expected End Date:   10/13/2020                    Follow Up Date 10/07/2020   - arrange a ride through an agency 1 week before appointment - ask family or friend for a ride - call to cancel if needed - keep a calendar with prescription refill dates - keep a calendar with appointment dates   Barriers: Health Behaviors  Why  is this important?   Part of staying healthy is seeing the doctor for follow-up care.  If you forget your appointments, there are some things you can do to stay on track.    Notes:  7/25-Pt continue to do well with no acute issues or events. Will continue to monitor for any other needs. Will continue to discuss and update plan accordingly. 5/17-Pt was able to verify sufficient transportation and aware of all appointments. Pt has verified an office visit to his primary provider.          Raina Mina, RN Care Management Coordinator [place] Office [phone]

## 2020-09-08 DIAGNOSIS — Z9889 Other specified postprocedural states: Secondary | ICD-10-CM | POA: Diagnosis not present

## 2020-09-08 DIAGNOSIS — Z952 Presence of prosthetic heart valve: Secondary | ICD-10-CM | POA: Diagnosis not present

## 2020-09-08 DIAGNOSIS — Z955 Presence of coronary angioplasty implant and graft: Secondary | ICD-10-CM | POA: Diagnosis not present

## 2020-09-10 DIAGNOSIS — Z952 Presence of prosthetic heart valve: Secondary | ICD-10-CM | POA: Diagnosis not present

## 2020-09-10 DIAGNOSIS — Z955 Presence of coronary angioplasty implant and graft: Secondary | ICD-10-CM | POA: Diagnosis not present

## 2020-09-10 DIAGNOSIS — Z9889 Other specified postprocedural states: Secondary | ICD-10-CM | POA: Diagnosis not present

## 2020-09-20 ENCOUNTER — Ambulatory Visit (INDEPENDENT_AMBULATORY_CARE_PROVIDER_SITE_OTHER): Payer: Medicare Other

## 2020-09-20 DIAGNOSIS — I255 Ischemic cardiomyopathy: Secondary | ICD-10-CM

## 2020-09-22 DIAGNOSIS — Z955 Presence of coronary angioplasty implant and graft: Secondary | ICD-10-CM | POA: Diagnosis not present

## 2020-09-22 DIAGNOSIS — Z952 Presence of prosthetic heart valve: Secondary | ICD-10-CM | POA: Diagnosis not present

## 2020-09-22 DIAGNOSIS — Z9889 Other specified postprocedural states: Secondary | ICD-10-CM | POA: Diagnosis not present

## 2020-09-22 LAB — CUP PACEART REMOTE DEVICE CHECK
Battery Remaining Longevity: 66 mo
Battery Remaining Percentage: 63 %
Battery Voltage: 2.98 V
Brady Statistic AP VP Percent: 1 %
Brady Statistic AP VS Percent: 1 %
Brady Statistic AS VP Percent: 99 %
Brady Statistic AS VS Percent: 1 %
Brady Statistic RA Percent Paced: 1 %
Date Time Interrogation Session: 20220810002229
Implantable Lead Implant Date: 20130812
Implantable Lead Implant Date: 20190513
Implantable Lead Implant Date: 20190513
Implantable Lead Location: 753858
Implantable Lead Location: 753859
Implantable Lead Location: 753860
Implantable Pulse Generator Implant Date: 20190513
Lead Channel Impedance Value: 330 Ohm
Lead Channel Impedance Value: 380 Ohm
Lead Channel Impedance Value: 910 Ohm
Lead Channel Pacing Threshold Amplitude: 1 V
Lead Channel Pacing Threshold Amplitude: 1 V
Lead Channel Pacing Threshold Amplitude: 1.125 V
Lead Channel Pacing Threshold Pulse Width: 0.2 ms
Lead Channel Pacing Threshold Pulse Width: 0.4 ms
Lead Channel Pacing Threshold Pulse Width: 0.6 ms
Lead Channel Sensing Intrinsic Amplitude: 5 mV
Lead Channel Sensing Intrinsic Amplitude: 9.7 mV
Lead Channel Setting Pacing Amplitude: 2 V
Lead Channel Setting Pacing Amplitude: 2 V
Lead Channel Setting Pacing Amplitude: 2.125
Lead Channel Setting Pacing Pulse Width: 0.4 ms
Lead Channel Setting Pacing Pulse Width: 0.6 ms
Lead Channel Setting Sensing Sensitivity: 4 mV
Pulse Gen Model: 3562
Pulse Gen Serial Number: 9431568

## 2020-09-27 DIAGNOSIS — Z955 Presence of coronary angioplasty implant and graft: Secondary | ICD-10-CM | POA: Diagnosis not present

## 2020-09-27 DIAGNOSIS — Z9889 Other specified postprocedural states: Secondary | ICD-10-CM | POA: Diagnosis not present

## 2020-09-27 DIAGNOSIS — Z952 Presence of prosthetic heart valve: Secondary | ICD-10-CM | POA: Diagnosis not present

## 2020-09-29 DIAGNOSIS — Z952 Presence of prosthetic heart valve: Secondary | ICD-10-CM | POA: Diagnosis not present

## 2020-09-29 DIAGNOSIS — Z955 Presence of coronary angioplasty implant and graft: Secondary | ICD-10-CM | POA: Diagnosis not present

## 2020-09-29 DIAGNOSIS — Z9889 Other specified postprocedural states: Secondary | ICD-10-CM | POA: Diagnosis not present

## 2020-10-01 DIAGNOSIS — Z952 Presence of prosthetic heart valve: Secondary | ICD-10-CM | POA: Diagnosis not present

## 2020-10-01 DIAGNOSIS — Z955 Presence of coronary angioplasty implant and graft: Secondary | ICD-10-CM | POA: Diagnosis not present

## 2020-10-01 DIAGNOSIS — Z9889 Other specified postprocedural states: Secondary | ICD-10-CM | POA: Diagnosis not present

## 2020-10-04 DIAGNOSIS — Z955 Presence of coronary angioplasty implant and graft: Secondary | ICD-10-CM | POA: Diagnosis not present

## 2020-10-04 DIAGNOSIS — Z952 Presence of prosthetic heart valve: Secondary | ICD-10-CM | POA: Diagnosis not present

## 2020-10-04 DIAGNOSIS — Z9889 Other specified postprocedural states: Secondary | ICD-10-CM | POA: Diagnosis not present

## 2020-10-06 DIAGNOSIS — Z9889 Other specified postprocedural states: Secondary | ICD-10-CM | POA: Diagnosis not present

## 2020-10-06 DIAGNOSIS — Z952 Presence of prosthetic heart valve: Secondary | ICD-10-CM | POA: Diagnosis not present

## 2020-10-06 DIAGNOSIS — Z955 Presence of coronary angioplasty implant and graft: Secondary | ICD-10-CM | POA: Diagnosis not present

## 2020-10-07 ENCOUNTER — Other Ambulatory Visit: Payer: Self-pay | Admitting: Physician Assistant

## 2020-10-07 ENCOUNTER — Other Ambulatory Visit: Payer: Self-pay | Admitting: *Deleted

## 2020-10-07 NOTE — Patient Outreach (Signed)
[facility]) Care Management  10/07/2020  Ian Mcgee Jan 27, 1946 [id number]   Telephone Assessment-Successful- Other (MAZE procedure/valve repair)  Pt continues to do well with no acute issues or needs. Plan of care discussed with noted updates on pt's ongoing management of care. Pt remains active with his supportive family.   Will continue to encouraged adherence as pt has requested not to call month with no needs. RN offered quarterly as pt receptive. Will send update to provider on pt's disposition with [facility] services and follow up with ongoing plan of care on a quarterly follow up. No additional inquires and all questions answered with no additional interventions.    Goals Addressed             This Visit's Progress    COMPLETED: THN-Make and Keep All Appointments   On track    Timeframe:  Short-Term Goal Priority:  Medium Start Date:   06/29/2020                          Expected End Date:   10/13/2020                    Follow Up Date 10/07/2020   - arrange a ride through an agency 1 week before appointment - ask family or friend for a ride - call to cancel if needed - keep a calendar with prescription refill dates - keep a calendar with appointment dates   Barriers: Health Behaviors  Why is this important?   Part of staying healthy is seeing the doctor for follow-up care.  If you forget your appointments, there are some things you can do to stay on track.    Notes:  7/25-Pt continue to do well with no acute issues or events. Will continue to monitor for any other needs. Will continue to discuss and update plan accordingly. 5/17-Pt was able to verify sufficient transportation and aware of all appointments. Pt has verified an office visit to his primary provider.      THN-Matintain My Quality of Life   On track    Follow Up Date : 01/05/2021 Quarterly Follow up Timeframe:  Long-Range Goal Priority:  Medium Start Date: 06/29/2020                             Expected End Date:  01/12/2021                      - do one enjoyable thing every day - spend time with a child every day, borrow one if I have to - spend time outdoors at least 3 times a week   Barriers: Health Behaviors  Why is this important?   Having a long-term illness can be scary.  It can also be stressful for you and your caregiver.  These steps may help.    Notes:  8/25-Pt continue to express good quality of life activities with 6-10 miles runs/walks through out the week. Pt very motivated with active lifestyle with continues family support.  5/17 Pt has supportive family to assist in the home and has hired help to assist temporary as he continue to recover. Pt continue to follow the post-op discharge instructions and decline review of his medications at this time indicating they have been reviewed several times. Will continue to encourage adherence with his ongoing management of care.  Raina Mina, RN Care Management Coordinator [place] Office [phone]

## 2020-10-08 DIAGNOSIS — Z952 Presence of prosthetic heart valve: Secondary | ICD-10-CM | POA: Diagnosis not present

## 2020-10-08 DIAGNOSIS — Z9889 Other specified postprocedural states: Secondary | ICD-10-CM | POA: Diagnosis not present

## 2020-10-08 DIAGNOSIS — Z955 Presence of coronary angioplasty implant and graft: Secondary | ICD-10-CM | POA: Diagnosis not present

## 2020-10-11 ENCOUNTER — Other Ambulatory Visit: Payer: Self-pay

## 2020-10-11 ENCOUNTER — Other Ambulatory Visit: Payer: Self-pay | Admitting: Cardiology

## 2020-10-11 DIAGNOSIS — Z955 Presence of coronary angioplasty implant and graft: Secondary | ICD-10-CM | POA: Diagnosis not present

## 2020-10-11 DIAGNOSIS — Z952 Presence of prosthetic heart valve: Secondary | ICD-10-CM | POA: Diagnosis not present

## 2020-10-11 DIAGNOSIS — Z9889 Other specified postprocedural states: Secondary | ICD-10-CM | POA: Diagnosis not present

## 2020-10-11 MED ORDER — LOSARTAN POTASSIUM 25 MG PO TABS: 25.0000 mg | ORAL_TABLET | Freq: Every day | ORAL | 3 refills | Status: DC

## 2020-10-11 MED ORDER — ATORVASTATIN CALCIUM 40 MG PO TABS: 40.0000 mg | ORAL_TABLET | Freq: Every day | ORAL | 3 refills | Status: DC

## 2020-10-14 NOTE — Progress Notes (Signed)
Remote pacemaker transmission.   

## 2020-11-29 DIAGNOSIS — Z23 Encounter for immunization: Secondary | ICD-10-CM | POA: Diagnosis not present

## 2020-12-06 DIAGNOSIS — Z23 Encounter for immunization: Secondary | ICD-10-CM | POA: Diagnosis not present

## 2020-12-08 DIAGNOSIS — M1712 Unilateral primary osteoarthritis, left knee: Secondary | ICD-10-CM | POA: Diagnosis not present

## 2020-12-12 ENCOUNTER — Other Ambulatory Visit: Payer: Self-pay | Admitting: Cardiology

## 2020-12-13 ENCOUNTER — Ambulatory Visit: Payer: Medicare Other | Admitting: Cardiology

## 2020-12-13 NOTE — Telephone Encounter (Signed)
Prescription refill request for Eliquis received. Last office visit:[place] 09/02/20 Scr:0.95 07/15/20 Age: 1m Weight:70.8kg

## 2020-12-14 ENCOUNTER — Other Ambulatory Visit: Payer: Self-pay

## 2020-12-14 ENCOUNTER — Encounter: Payer: Self-pay | Admitting: Cardiology

## 2020-12-14 ENCOUNTER — Ambulatory Visit (INDEPENDENT_AMBULATORY_CARE_PROVIDER_SITE_OTHER): Payer: Medicare Other | Admitting: Cardiology

## 2020-12-14 VITALS — BP 140/80 | HR 76 | Ht 71.0 in | Wt 155.0 lb

## 2020-12-14 DIAGNOSIS — I2510 Atherosclerotic heart disease of native coronary artery without angina pectoris: Secondary | ICD-10-CM | POA: Diagnosis not present

## 2020-12-14 DIAGNOSIS — Z951 Presence of aortocoronary bypass graft: Secondary | ICD-10-CM | POA: Diagnosis not present

## 2020-12-14 DIAGNOSIS — I340 Nonrheumatic mitral (valve) insufficiency: Secondary | ICD-10-CM | POA: Diagnosis not present

## 2020-12-14 DIAGNOSIS — Z9889 Other specified postprocedural states: Secondary | ICD-10-CM

## 2020-12-14 DIAGNOSIS — I255 Ischemic cardiomyopathy: Secondary | ICD-10-CM | POA: Diagnosis not present

## 2020-12-14 DIAGNOSIS — Z8679 Personal history of other diseases of the circulatory system: Secondary | ICD-10-CM

## 2020-12-14 DIAGNOSIS — I442 Atrioventricular block, complete: Secondary | ICD-10-CM | POA: Diagnosis not present

## 2020-12-14 NOTE — Patient Instructions (Signed)
Medication Instructions:  Your physician recommends that you continue on your current medications as directed. Please refer to the Current Medication list given to you today.  *If you need a refill on your cardiac medications before your next appointment, please call your pharmacy*   Lab Work: Your physician recommends that you return for lab work today: bmp, lipid  If you have labs (blood work) drawn today and your tests are completely normal, you will receive your results only by: [place] (if you have MyChart) OR A paper copy in the mail If you have any lab test that is abnormal or we need to change your treatment, we will call you to review the results.   Testing/Procedures: Your physician has requested that you have an echocardiogram. Echocardiography is a painless test that uses sound waves to create images of your heart. It provides your doctor with information about the size and shape of your heart and how well your heart's chambers and valves are working. This procedure takes approximately one hour. There are no restrictions for this procedure.  Your physician has requested that you have a carotid duplex. This test is an ultrasound of the carotid arteries in your neck. It looks at blood flow through these arteries that supply the brain with blood. Allow one hour for this exam. There are no restrictions or special instructions.    Follow-Up: At [facility], you and your health needs are our priority.  As part of our continuing mission to provide you with exceptional heart care, we have created designated Provider Care Teams.  These Care Teams include your primary Cardiologist (physician) and Advanced Practice Providers (APPs -  Physician Assistants and Nurse Practitioners) who all work together to provide you with the care you need, when you need it.  We recommend signing up for the patient portal called "MyChart".  Sign up information is provided on this After Visit  Summary.  MyChart is used to connect with patients for Virtual Visits (Telemedicine).  Patients are able to view lab/test results, encounter notes, upcoming appointments, etc.  Non-urgent messages can be sent to your provider as well.   To learn more about what you can do with MyChart, go to [email or web address].    Your next appointment:   5 month(s)  The format for your next appointment:   In Person  Provider:   Jenne Campus, MD   Other Instructions  Echocardiogram An echocardiogram is a test that uses sound waves (ultrasound) to produce images of the heart. Images from an echocardiogram can provide important information about: Heart size and shape. The size and thickness and movement of your heart's walls. Heart muscle function and strength. Heart valve function or if you have stenosis. Stenosis is when the heart valves are too narrow. If blood is flowing backward through the heart valves (regurgitation). A tumor or infectious growth around the heart valves. Areas of heart muscle that are not working well because of poor blood flow or injury from a heart attack. Aneurysm detection. An aneurysm is a weak or damaged part of an artery wall. The wall bulges out from the normal force of blood pumping through the body. Tell a health care provider about: Any allergies you have. All medicines you are taking, including vitamins, herbs, eye drops, creams, and over-the-counter medicines. Any blood disorders you have. Any surgeries you have had. Any medical conditions you have. Whether you are pregnant or may be pregnant. What are the risks? Generally, this is a safe test. However,  problems may occur, including an allergic reaction to dye (contrast) that may be used during the test. What happens before the test? No specific preparation is needed. You may eat and drink normally. What happens during the test?  You will take off your clothes from the waist up and put on a hospital  gown. Electrodes or electrocardiogram (ECG)patches may be placed on your chest. The electrodes or patches are then connected to a device that monitors your heart rate and rhythm. You will lie down on a table for an ultrasound exam. A gel will be applied to your chest to help sound waves pass through your skin. A handheld device, called a transducer, will be pressed against your chest and moved over your heart. The transducer produces sound waves that travel to your heart and bounce back (or "echo" back) to the transducer. These sound waves will be captured in real-time and changed into images of your heart that can be viewed on a video monitor. The images will be recorded on a computer and reviewed by your health care provider. You may be asked to change positions or hold your breath for a short time. This makes it easier to get different views or better views of your heart. In some cases, you may receive contrast through an IV in one of your veins. This can improve the quality of the pictures from your heart. The procedure may vary among health care providers and hospitals. What can I expect after the test? You may return to your normal, everyday life, including diet, activities, and medicines, unless your health care provider tells you not to do that. Follow these instructions at home: It is up to you to get the results of your test. Ask your health care provider, or the department that is doing the test, when your results will be ready. Keep all follow-up visits. This is important. Summary An echocardiogram is a test that uses sound waves (ultrasound) to produce images of the heart. Images from an echocardiogram can provide important information about the size and shape of your heart, heart muscle function, heart valve function, and other possible heart problems. You do not need to do anything to prepare before this test. You may eat and drink normally. After the echocardiogram is completed, you  may return to your normal, everyday life, unless your health care provider tells you not to do that. This information is not intended to replace advice given to you by your health care provider. Make sure you discuss any questions you have with your health care provider. Document Revised: 09/23/2019 Document Reviewed: 09/23/2019 Elsevier Patient Education  2022 [employer].

## 2020-12-14 NOTE — Progress Notes (Signed)
Cardiology Office Note:    Date:  12/14/2020   ID:  ABIMELEC GROCHOWSKI, DOB [birth year], MRN [id number]  PCP:  Mateo Flow, MD  Cardiologist:  Jenne Campus, MD    Referring MD: Mateo Flow, MD   Chief Complaint  Patient presents with   Follow-up  I am doing fine  History of Present Illness:    Ian Mcgee is a 75 y.o. male  Ian Mcgee is a 75 y.o. male   with past medical history significant for coronary artery disease, cardiomyopathy, peripheral vascular disease.  I been following for years and recently he developed diminished left ventricle ejection fraction on top of that he started having some more symptoms and up having cardiac catheterization done which showed significant right coronary artery lesion as well as significant LAD and diagonal branch lesion.  He was also found to have severe mitral regurgitation.  Eventually decision has been made to pursue surgical intervention.  His past medical history is also significant for BiV pacing initially he got DDD pacemaker but then developed cardiomyopathy in his system was upgraded to BiV pacing that happened in 2019.  After that he showed improvement left ventricle ejection fraction, however then later his ejection fraction deteriorated which we felt was related to ischemia.  On 06/02/2020 he underwent triple vessel bypass graft with LIMA to LAD, SVG to posterior descending artery, SVG to diagonal branch.  He also got mitral valve ring placed which is 28 mm Edwards ring, Maze procedure was done at that time and clipping of left atrial appendage was performed.  He is post surgical recovery time was complicated by acute renal failure which was less likely ATN, he recovered completely from that. Is coming today 2 months for follow-up overall he is doing well.  He said he is dramatically improved compared to before surgery.  Biggest complaint he have is problem with agility he said when he played tennis he had difficulty getting a  time to the ball.  But he tried to walk on the regular basis and he noticed improving strength.  There is no shortness of breath chest pain tightness squeezing pressure burning chest no swelling of lower extremities no palpitations no dizziness no passing out.  Past Medical History:  Diagnosis Date   Acute on chronic systolic heart failure (HCC)    Attention deficit disorder (ADD)    Cardiac pacemaker 05/10/2017   Cardiomyopathy ([place])    Chest pain 06/05/2020   Chronic apical periodontitis    Complete heart block ([place]) 06/10/2015   Coronary artery disease    40% left main, 50% LAD, 50% RCA recent cardiac catheterization from March 2019   Dyslipidemia    Encounter for preoperative dental examination    Essential hypertension    Gingival recession, generalized    Gingivitis    Hyperlipidemia    Long term (current) use of anticoagulants 06/22/2020   Mitral regurgitation    NSTEMI (non-ST elevated myocardial infarction) ([place]) 06/06/2020   Pacemaker reprogramming/check 06/10/2015   Peripheral vascular disease ([place])    Noncritical bilateral carotid arterial disease   Pneumonia    S/P CABG x 3 06/10/2020   LIMA to LAD, SVG to Diag, SVG to PDA   S/P Maze operation for atrial fibrillation 06/10/2020   Left atrial lesion set using bipolar radiofrequency and cryothermy ablation with clipping of LA appendage   S/P mitral valve repair 06/10/2020   Annuloplasty with a M28 mm Model 4100  Edwards CARPENTIER MCCARTHY-ADAMS  SN:  [id number]     Status post biventricular pacemaker 07/16/2017   Syncope and collapse    Teeth missing     Past Surgical History:  Procedure Laterality Date   Basel Cell Cancer     BIV UPGRADE N/A 06/25/2017   Procedure: BIV UPGRADE;  Surgeon: Constance Haw, MD;  Location: [place] [facility];  Service: Cardiovascular;  Laterality: N/A;   BUBBLE STUDY  06/03/2020   Procedure: BUBBLE STUDY;  Surgeon: [name], MD;  Location: [place];  Service: Cardiology;;    CATARACT EXTRACTION     CLIPPING OF ATRIAL APPENDAGE  06/10/2020   Procedure: CLIPPING OF ATRIAL APPENDAGE USING  ATRICURE PRO2 CLIP SIZE 45MM;  Surgeon: Rexene Alberts, MD;  Location: [facility] OR;  Service: Open Heart Surgery;;   CORONARY ARTERY BYPASS GRAFT N/A 06/10/2020   Procedure: CORONARY ARTERY BYPASS GRAFTING (CABG) X THREE USING LEFT INTERNAL MAMMARY ARTERY AND BILATERAL LEGS GREATER SAPHENOUS VEIN HARVESTED ENDOSCOPICALLY;  Surgeon: Rexene Alberts, MD;  Location: [place];  Service: Open Heart Surgery;  Laterality: N/A;   INSERT / REPLACE / REMOVE PACEMAKER     INTRAVASCULAR PRESSURE WIRE/FFR STUDY N/A 05/28/2020   Procedure: INTRAVASCULAR PRESSURE WIRE/FFR STUDY;  Surgeon: [name], MD;  Location: [place] [facility];  Service: Cardiovascular;  Laterality: N/A;   KNEE SURGERY Right    ACL Reconstruction   LEFT HEART CATH AND CORONARY ANGIOGRAPHY N/A 05/10/2017   Procedure: LEFT HEART CATH AND CORONARY ANGIOGRAPHY;  Surgeon: [name], MD;  Location: [place] [facility];  Service: Cardiovascular;  Laterality: N/A;   MAZE N/A 06/10/2020   Procedure: MAZE;  Surgeon: Rexene Alberts, MD;  Location: [place];  Service: Open Heart Surgery;  Laterality: N/A;   MITRAL VALVE REPAIR N/A 06/10/2020   Procedure: MITRAL VALVE REPAIR USING CARPENTIER MCCARTHY-ADAMS RING SIZE 28MM;  Surgeon: Rexene Alberts, MD;  Location: [place];  Service: Open Heart Surgery;  Laterality: N/A;   RIGHT/LEFT HEART CATH AND CORONARY ANGIOGRAPHY N/A 05/28/2020   Procedure: RIGHT/LEFT HEART CATH AND CORONARY ANGIOGRAPHY;  Surgeon: [name], MD;  Location: [place] [facility];  Service: Cardiovascular;  Laterality: N/A;   TEE WITHOUT CARDIOVERSION N/A 06/03/2020   Procedure: TRANSESOPHAGEAL ECHOCARDIOGRAM (TEE);  Surgeon: [name], MD;  Location: [place];  Service: Cardiology;  Laterality: N/A;   TEE WITHOUT CARDIOVERSION N/A 06/10/2020   Procedure: TRANSESOPHAGEAL ECHOCARDIOGRAM (TEE);  Surgeon: Rexene Alberts, MD;  Location: [place];  Service: Open Heart Surgery;  Laterality: N/A;   VASECTOMY      Current Medications: Current Meds  Medication Sig   apixaban (ELIQUIS) 5 MG TABS tablet TAKE 1 TABLET(5 MG) BY MOUTH TWICE DAILY   aspirin EC 81 MG EC tablet Take 1 tablet (81 mg total) by mouth daily. Swallow whole.   atorvastatin (LIPITOR) 40 MG tablet Take 1 tablet (40 mg total) by mouth daily.   B-12, Methylcobalamin, 1000 MCG SUBL Place 1,000 mcg under the tongue every morning.   empagliflozin (JARDIANCE) 10 MG TABS tablet Take 1 tablet (10 mg total) by mouth daily before breakfast.   furosemide (LASIX) 20 MG tablet Take 1 tablet (20 mg total) by mouth as needed.   losartan (COZAAR) 25 MG tablet Take 1 tablet (25 mg total) by mouth daily.   metoprolol succinate (TOPROL XL) 25 MG 24 hr tablet Take 0.5 tablets (12.5 mg total) by mouth at bedtime.   Multiple Vitamin (MULTIVITAMIN ADULT PO) Take 1 tablet by mouth  daily in the afternoon. Unknown strength   nitroGLYCERIN (NITROSTAT) 0.4 MG SL tablet Place 1 tablet (0.4 mg total) under the tongue every 5 (five) minutes x 3 doses as needed for chest pain.   Probiotic Product (PROBIOTIC PO) Take 1 tablet by mouth every morning. Unknown strength   psyllium (METAMUCIL) 58.6 % packet Take 1 packet by mouth every morning.   spironolactone (ALDACTONE) 25 MG tablet Take 0.5 tablets (12.5 mg total) by mouth daily.     Allergies:   Ciprofloxacin, Crestor [rosuvastatin calcium], and Nexletol [bempedoic acid]   Social History   Socioeconomic History   Marital status: Married    Spouse name: Not on file   Number of children: Not on file   Years of education: Not on file   Highest education level: Not on file  Occupational History   Occupation: Engineer, drilling  Tobacco Use   Smoking status: Former    Types: Cigarettes   Smokeless tobacco: Never  Vaping Use   Vaping Use: Never used  Substance and Sexual Activity   Alcohol use: Yes     Comment: Wine with dinner   Drug use: Never   Sexual activity: Not on file  Other Topics Concern   Not on file  Social History Narrative   Not on file   Social Determinants of Health   Financial Resource Strain: Not on file  Food Insecurity: No Food Insecurity   Worried About Charity fundraiser in the Last Year: Never true   [employer] in the Last Year: Never true  Transportation Needs: No Transportation Needs   Lack of Transportation (Medical): No   Lack of Transportation (Non-Medical): No  Physical Activity: Not on file  Stress: Not on file  Social Connections: Not on file     Family History: The patient's family history includes CAD in his brother, father, and mother; Healthy in his brother; Hypertension in his mother; Leukemia in his father; Osteoporosis in his mother; Ovarian cancer in his sister; Rheumatic fever in his brother. ROS:   Please see the history of present illness.    All 14 point review of systems negative except as described per history of present illness  EKGs/Labs/Other Studies Reviewed:      Recent Labs: 06/09/2020: ALT 20 06/15/2020: B Natriuretic Peptide 492.8; Magnesium 2.2 07/05/2020: Hemoglobin 10.7; NT-Pro BNP 2,139; Platelets 395 07/15/2020: BUN 19; Creatinine, Ser 0.95; Potassium 4.3; Sodium 137  Recent Lipid Panel    Component Value Date/Time   CHOL 196 05/29/2020 0100   TRIG 92 05/29/2020 0100   HDL 71 05/29/2020 0100   CHOLHDL 2.8 05/29/2020 0100   VLDL 18 05/29/2020 0100   LDLCALC 107 (H) 05/29/2020 0100    Physical Exam:    VS:  BP 140/80 (BP Location: Left Arm, Patient Position: Sitting, Cuff Size: Normal)   Pulse 76   Ht 5\' 11"  (1.803 m)   Wt 155 lb (70.3 kg)   SpO2 97%   BMI 21.62 kg/m     Wt Readings from Last 3 Encounters:  12/14/20 155 lb (70.3 kg)  09/02/20 156 lb (70.8 kg)  08/12/20 153 lb 6.4 oz (69.6 kg)     GEN:  Well nourished, well developed in no acute distress HEENT: Normal NECK: No JVD; right carotid  bruit LYMPHATICS: No lymphadenopathy CARDIAC: RRR, systolic ejection murmur grade 1/6 to 2/6 proximal right upper portion of the sternum, no rubs, no gallops RESPIRATORY:  Clear to auscultation without rales, wheezing or rhonchi  ABDOMEN: Soft, non-tender, non-distended MUSCULOSKELETAL:  No edema; No deformity  SKIN: Warm and dry LOWER EXTREMITIES: no swelling NEUROLOGIC:  Alert and oriented x 3 PSYCHIATRIC:  Normal affect   ASSESSMENT:    1. Coronary artery disease involving native coronary artery of native heart without angina pectoris   2. Nonrheumatic mitral valve regurgitation   3. Ischemic cardiomyopathy   4. Complete heart block ([place])   5. S/P CABG x 3   6. S/P mitral valve repair   7. S/P Maze operation for atrial fibrillation    PLAN:    In order of problems listed above:  Coronary disease status post coronary artery bypass graft.  Doing well from that point review.  He is on Eliquis as well as aspirin.  Which I advised to continue at least for now however in the future hopefully will be able to discontinue his aspirin. Dyslipidemia I did review his K PN from spring of this year which show LDL of 107 HDL 71.  He is on Lipitor 40 which I will continue.  We will check his fasting lipid profile. Status post mitral valve repair heart sounds good.  We will repeat echocardiogram to check status of the repair History of cardiomyopathy with still significantly diminished left ventricle ejection fraction, echocardiogram will be done to check on that. Right carotic bruit: We will do carotic ultrasound On discussion about good exercise habits as well as overall wellbeing.  He is seems to be doing very well.   Medication Adjustments/Labs and Tests Ordered: Current medicines are reviewed at length with the patient today.  Concerns regarding medicines are outlined above.  No orders of the defined types were placed in this encounter.  Medication changes: No orders of the defined types  were placed in this encounter.   Signed, Park Liter, MD, [facility] 12/14/2020 8:46 AM    [place]

## 2020-12-15 DIAGNOSIS — Z951 Presence of aortocoronary bypass graft: Secondary | ICD-10-CM | POA: Diagnosis not present

## 2020-12-15 DIAGNOSIS — I340 Nonrheumatic mitral (valve) insufficiency: Secondary | ICD-10-CM | POA: Diagnosis not present

## 2020-12-15 DIAGNOSIS — Z8679 Personal history of other diseases of the circulatory system: Secondary | ICD-10-CM | POA: Diagnosis not present

## 2020-12-15 DIAGNOSIS — I255 Ischemic cardiomyopathy: Secondary | ICD-10-CM | POA: Diagnosis not present

## 2020-12-15 DIAGNOSIS — I2510 Atherosclerotic heart disease of native coronary artery without angina pectoris: Secondary | ICD-10-CM | POA: Diagnosis not present

## 2020-12-15 DIAGNOSIS — Z9889 Other specified postprocedural states: Secondary | ICD-10-CM | POA: Diagnosis not present

## 2020-12-15 DIAGNOSIS — I442 Atrioventricular block, complete: Secondary | ICD-10-CM | POA: Diagnosis not present

## 2020-12-16 ENCOUNTER — Telehealth: Payer: Self-pay | Admitting: Emergency Medicine

## 2020-12-16 ENCOUNTER — Telehealth: Payer: Self-pay | Admitting: Cardiology

## 2020-12-16 ENCOUNTER — Other Ambulatory Visit: Payer: Self-pay | Admitting: Cardiology

## 2020-12-16 DIAGNOSIS — E785 Hyperlipidemia, unspecified: Secondary | ICD-10-CM

## 2020-12-16 LAB — BASIC METABOLIC PANEL
BUN/Creatinine Ratio: 19 (ref 10–24)
BUN: 17 mg/dL (ref 8–27)
CO2: 25 mmol/L (ref 20–29)
Calcium: 9.3 mg/dL (ref 8.6–10.2)
Chloride: 104 mmol/L (ref 96–106)
Creatinine, Ser: 0.91 mg/dL (ref 0.76–1.27)
Glucose: 90 mg/dL (ref 70–99)
Potassium: 4.4 mmol/L (ref 3.5–5.2)
Sodium: 141 mmol/L (ref 134–144)
eGFR: 88 mL/min/{1.73_m2} (ref >59)

## 2020-12-16 LAB — LIPID PANEL
Chol/HDL Ratio: 2.1 ratio (ref 0.0–5.0)
Cholesterol, Total: 181 mg/dL (ref 100–199)
HDL: 87 mg/dL (ref >39)
LDL Chol Calc (NIH): 85 mg/dL (ref 0–99)
Triglycerides: 45 mg/dL (ref 0–149)
VLDL Cholesterol Cal: 9 mg/dL (ref 5–40)

## 2020-12-16 MED ORDER — ATORVASTATIN CALCIUM 80 MG PO TABS: 80.0000 mg | ORAL_TABLET | Freq: Every day | ORAL | 1 refills | Status: DC

## 2020-12-16 NOTE — Telephone Encounter (Signed)
Spoke to patient informed him of results.

## 2020-12-16 NOTE — Telephone Encounter (Signed)
Left message for patient to return call.

## 2020-12-16 NOTE — Telephone Encounter (Signed)
° °  Pt is returning call to get lab result °

## 2020-12-16 NOTE — Telephone Encounter (Signed)
Patient informed of results he will increase lipitor to 80 mg daily and repeat labs in 6 weeks.

## 2020-12-16 NOTE — Telephone Encounter (Signed)
-----   Message from Gita Kudo, RN sent at 12/16/2020  8:49 AM EDT -----  ----- Message ----- From: Park Liter, MD Sent: 12/16/2020   8:49 AM EDT To: Gita Kudo, RN  Chem-7 looks good, his cholesterol however LDL is 85 which is dramatically better than before but ideally need to be less than 70.  Please increase Lipitor to 80 mg daily.  Fasting lipid profile, AST ALT need to be repeated in few weeks which is 6 weeks

## 2020-12-20 ENCOUNTER — Ambulatory Visit (INDEPENDENT_AMBULATORY_CARE_PROVIDER_SITE_OTHER): Payer: Medicare Other

## 2020-12-20 DIAGNOSIS — I255 Ischemic cardiomyopathy: Secondary | ICD-10-CM

## 2020-12-20 LAB — CUP PACEART REMOTE DEVICE CHECK
Battery Remaining Longevity: 63 mo
Battery Remaining Percentage: 60 %
Battery Voltage: 2.98 V
Brady Statistic AP VP Percent: 1 %
Brady Statistic AP VS Percent: 1 %
Brady Statistic AS VP Percent: 99 %
Brady Statistic AS VS Percent: 1 %
Brady Statistic RA Percent Paced: 1 %
Date Time Interrogation Session: 20221107010019
Implantable Lead Implant Date: 20130812
Implantable Lead Implant Date: 20190513
Implantable Lead Implant Date: 20190513
Implantable Lead Location: 753858
Implantable Lead Location: 753859
Implantable Lead Location: 753860
Implantable Pulse Generator Implant Date: 20190513
Lead Channel Impedance Value: 1000 Ohm
Lead Channel Impedance Value: 330 Ohm
Lead Channel Impedance Value: 380 Ohm
Lead Channel Pacing Threshold Amplitude: 1 V
Lead Channel Pacing Threshold Amplitude: 1.125 V
Lead Channel Pacing Threshold Amplitude: 1.125 V
Lead Channel Pacing Threshold Pulse Width: 0.2 ms
Lead Channel Pacing Threshold Pulse Width: 0.4 ms
Lead Channel Pacing Threshold Pulse Width: 0.6 ms
Lead Channel Sensing Intrinsic Amplitude: 5 mV
Lead Channel Sensing Intrinsic Amplitude: 9.7 mV
Lead Channel Setting Pacing Amplitude: 2 V
Lead Channel Setting Pacing Amplitude: 2.125
Lead Channel Setting Pacing Amplitude: 2.125
Lead Channel Setting Pacing Pulse Width: 0.4 ms
Lead Channel Setting Pacing Pulse Width: 0.6 ms
Lead Channel Setting Sensing Sensitivity: 4 mV
Pulse Gen Model: 3562
Pulse Gen Serial Number: 9431568

## 2020-12-23 ENCOUNTER — Ambulatory Visit (INDEPENDENT_AMBULATORY_CARE_PROVIDER_SITE_OTHER): Payer: Medicare Other

## 2020-12-23 ENCOUNTER — Other Ambulatory Visit: Payer: Self-pay

## 2020-12-23 DIAGNOSIS — I442 Atrioventricular block, complete: Secondary | ICD-10-CM | POA: Diagnosis not present

## 2020-12-23 DIAGNOSIS — I6521 Occlusion and stenosis of right carotid artery: Secondary | ICD-10-CM

## 2020-12-23 DIAGNOSIS — Z8679 Personal history of other diseases of the circulatory system: Secondary | ICD-10-CM

## 2020-12-23 DIAGNOSIS — I340 Nonrheumatic mitral (valve) insufficiency: Secondary | ICD-10-CM | POA: Diagnosis not present

## 2020-12-23 DIAGNOSIS — Z951 Presence of aortocoronary bypass graft: Secondary | ICD-10-CM | POA: Diagnosis not present

## 2020-12-23 DIAGNOSIS — I255 Ischemic cardiomyopathy: Secondary | ICD-10-CM

## 2020-12-23 DIAGNOSIS — Z9889 Other specified postprocedural states: Secondary | ICD-10-CM

## 2020-12-23 DIAGNOSIS — I2510 Atherosclerotic heart disease of native coronary artery without angina pectoris: Secondary | ICD-10-CM

## 2020-12-23 LAB — ECHOCARDIOGRAM COMPLETE
Area-P 1/2: 2.45 cm2
Calc EF: 45.2 %
MV VTI: 1.16 cm2
S' Lateral: 3.8 cm
Single Plane A2C EF: 46.9 %
Single Plane A4C EF: 45.4 %

## 2020-12-28 NOTE — Progress Notes (Signed)
Remote pacemaker transmission.   

## 2021-01-05 ENCOUNTER — Other Ambulatory Visit: Payer: Self-pay | Admitting: *Deleted

## 2021-01-05 NOTE — Patient Outreach (Signed)
[facility]) Care Management  01/05/2021  Ian Mcgee March 27, 1945 [id number]   Case Closure -Successful Outreach  Spoke with pt today and verified pt continue to do well with no acute issues reported. Informed pt that another case management services would be servicing him with this provider's office  Pt aware to expect a call of inquiry and [facility] would no longer be involved. Pt verbalized an understanding.  Case Closed.  Raina Mina, RN Care Management Coordinator [place] Office [phone]

## 2021-01-10 ENCOUNTER — Other Ambulatory Visit: Payer: Self-pay | Admitting: Physician Assistant

## 2021-01-18 DIAGNOSIS — Z125 Encounter for screening for malignant neoplasm of prostate: Secondary | ICD-10-CM | POA: Diagnosis not present

## 2021-01-18 DIAGNOSIS — F988 Other specified behavioral and emotional disorders with onset usually occurring in childhood and adolescence: Secondary | ICD-10-CM | POA: Diagnosis not present

## 2021-01-18 DIAGNOSIS — D519 Vitamin B12 deficiency anemia, unspecified: Secondary | ICD-10-CM | POA: Diagnosis not present

## 2021-01-18 DIAGNOSIS — Z0000 Encounter for general adult medical examination without abnormal findings: Secondary | ICD-10-CM | POA: Diagnosis not present

## 2021-01-18 DIAGNOSIS — Z79899 Other long term (current) drug therapy: Secondary | ICD-10-CM | POA: Diagnosis not present

## 2021-01-18 DIAGNOSIS — E7800 Pure hypercholesterolemia, unspecified: Secondary | ICD-10-CM | POA: Diagnosis not present

## 2021-01-24 ENCOUNTER — Other Ambulatory Visit: Payer: Self-pay | Admitting: Physician Assistant

## 2021-01-26 ENCOUNTER — Encounter: Payer: Self-pay | Admitting: Cardiology

## 2021-02-03 ENCOUNTER — Other Ambulatory Visit: Payer: Self-pay | Admitting: Physician Assistant

## 2021-02-05 ENCOUNTER — Other Ambulatory Visit: Payer: Self-pay | Admitting: Physician Assistant

## 2021-02-11 MED ORDER — SPIRONOLACTONE 25 MG PO TABS: 12.5000 mg | ORAL_TABLET | Freq: Every day | ORAL | 3 refills | Status: DC

## 2021-02-15 MED ORDER — SPIRONOLACTONE 25 MG PO TABS: 12.5000 mg | ORAL_TABLET | Freq: Every day | ORAL | 3 refills | Status: DC

## 2021-02-15 MED ORDER — METOPROLOL SUCCINATE ER 25 MG PO TB24: 12.5000 mg | ORAL_TABLET | Freq: Every day | ORAL | 2 refills | Status: DC

## 2021-02-15 MED ORDER — ATORVASTATIN CALCIUM 80 MG PO TABS: 80.0000 mg | ORAL_TABLET | Freq: Every day | ORAL | 2 refills | Status: DC

## 2021-02-15 NOTE — Addendum Note (Signed)
Addended by: Senaida Ores on: 02/15/2021 12:46 PM   Modules accepted: Orders

## 2021-03-21 ENCOUNTER — Ambulatory Visit (INDEPENDENT_AMBULATORY_CARE_PROVIDER_SITE_OTHER): Payer: Medicare Other

## 2021-03-21 DIAGNOSIS — I255 Ischemic cardiomyopathy: Secondary | ICD-10-CM | POA: Diagnosis not present

## 2021-03-22 LAB — CUP PACEART REMOTE DEVICE CHECK
Battery Remaining Longevity: 60 mo
Battery Remaining Percentage: 57 %
Battery Voltage: 2.98 V
Brady Statistic AP VP Percent: 1 %
Brady Statistic AP VS Percent: 1 %
Brady Statistic AS VP Percent: 99 %
Brady Statistic AS VS Percent: 1 %
Brady Statistic RA Percent Paced: 1 %
Date Time Interrogation Session: 20230206020010
Implantable Lead Implant Date: 20130812
Implantable Lead Implant Date: 20190513
Implantable Lead Implant Date: 20190513
Implantable Lead Location: 753858
Implantable Lead Location: 753859
Implantable Lead Location: 753860
Implantable Pulse Generator Implant Date: 20190513
Lead Channel Impedance Value: 330 Ohm
Lead Channel Impedance Value: 390 Ohm
Lead Channel Impedance Value: 950 Ohm
Lead Channel Pacing Threshold Amplitude: 1 V
Lead Channel Pacing Threshold Amplitude: 1 V
Lead Channel Pacing Threshold Amplitude: 1.125 V
Lead Channel Pacing Threshold Pulse Width: 0.2 ms
Lead Channel Pacing Threshold Pulse Width: 0.4 ms
Lead Channel Pacing Threshold Pulse Width: 0.6 ms
Lead Channel Sensing Intrinsic Amplitude: 12 mV
Lead Channel Sensing Intrinsic Amplitude: 5 mV
Lead Channel Setting Pacing Amplitude: 2 V
Lead Channel Setting Pacing Amplitude: 2 V
Lead Channel Setting Pacing Amplitude: 2.125
Lead Channel Setting Pacing Pulse Width: 0.4 ms
Lead Channel Setting Pacing Pulse Width: 0.6 ms
Lead Channel Setting Sensing Sensitivity: 4 mV
Pulse Gen Model: 3562
Pulse Gen Serial Number: 9431568

## 2021-03-24 NOTE — Progress Notes (Signed)
Remote pacemaker transmission.   

## 2021-04-01 DIAGNOSIS — J309 Allergic rhinitis, unspecified: Secondary | ICD-10-CM | POA: Diagnosis not present

## 2021-04-01 DIAGNOSIS — R0981 Nasal congestion: Secondary | ICD-10-CM | POA: Diagnosis not present

## 2021-04-01 DIAGNOSIS — R0982 Postnasal drip: Secondary | ICD-10-CM | POA: Diagnosis not present

## 2021-04-01 DIAGNOSIS — Z20828 Contact with and (suspected) exposure to other viral communicable diseases: Secondary | ICD-10-CM | POA: Diagnosis not present

## 2021-04-06 DIAGNOSIS — D485 Neoplasm of uncertain behavior of skin: Secondary | ICD-10-CM | POA: Insufficient documentation

## 2021-04-26 DIAGNOSIS — J0120 Acute ethmoidal sinusitis, unspecified: Secondary | ICD-10-CM | POA: Diagnosis not present

## 2021-04-26 DIAGNOSIS — F988 Other specified behavioral and emotional disorders with onset usually occurring in childhood and adolescence: Secondary | ICD-10-CM | POA: Diagnosis not present

## 2021-04-26 DIAGNOSIS — Z20828 Contact with and (suspected) exposure to other viral communicable diseases: Secondary | ICD-10-CM | POA: Diagnosis not present

## 2021-05-05 DIAGNOSIS — D485 Neoplasm of uncertain behavior of skin: Secondary | ICD-10-CM | POA: Diagnosis not present

## 2021-05-06 DIAGNOSIS — L820 Inflamed seborrheic keratosis: Secondary | ICD-10-CM | POA: Diagnosis not present

## 2021-05-08 ENCOUNTER — Encounter: Payer: Self-pay | Admitting: Cardiology

## 2021-05-11 DIAGNOSIS — Z09 Encounter for follow-up examination after completed treatment for conditions other than malignant neoplasm: Secondary | ICD-10-CM | POA: Insufficient documentation

## 2021-05-17 ENCOUNTER — Other Ambulatory Visit: Payer: Self-pay

## 2021-05-18 ENCOUNTER — Ambulatory Visit (INDEPENDENT_AMBULATORY_CARE_PROVIDER_SITE_OTHER): Payer: Medicare Other | Admitting: Cardiology

## 2021-05-18 ENCOUNTER — Encounter: Payer: Self-pay | Admitting: Cardiology

## 2021-05-18 VITALS — BP 128/72 | HR 73 | Ht 71.0 in | Wt 155.2 lb

## 2021-05-18 DIAGNOSIS — E785 Hyperlipidemia, unspecified: Secondary | ICD-10-CM | POA: Diagnosis not present

## 2021-05-18 DIAGNOSIS — R0989 Other specified symptoms and signs involving the circulatory and respiratory systems: Secondary | ICD-10-CM

## 2021-05-18 DIAGNOSIS — Z950 Presence of cardiac pacemaker: Secondary | ICD-10-CM | POA: Diagnosis not present

## 2021-05-18 DIAGNOSIS — Z8679 Personal history of other diseases of the circulatory system: Secondary | ICD-10-CM

## 2021-05-18 DIAGNOSIS — Z9889 Other specified postprocedural states: Secondary | ICD-10-CM

## 2021-05-18 DIAGNOSIS — Z951 Presence of aortocoronary bypass graft: Secondary | ICD-10-CM | POA: Diagnosis not present

## 2021-05-18 DIAGNOSIS — I255 Ischemic cardiomyopathy: Secondary | ICD-10-CM

## 2021-05-18 DIAGNOSIS — I2510 Atherosclerotic heart disease of native coronary artery without angina pectoris: Secondary | ICD-10-CM

## 2021-05-18 NOTE — Progress Notes (Signed)
?Cardiology Office Note:   ? ?Date:  05/18/2021  ? ?ID:  Ian Mcgee, DOB August 11, 1945, MRN [id number] ? ?PCP:  Mateo Flow, MD  ?Cardiologist:  Jenne Campus, MD   ? ?Referring MD: Mateo Flow, MD  ? ?Chief Complaint  ?Patient presents with  ? Medication Management  ? ? ?History of Present Illness:   ? ?Ian Mcgee is a 76 y.o. male   with past medical history significant for coronary artery disease, cardiomyopathy, peripheral vascular disease.  I been following for years eventually he developed diminished left ventricle ejection fraction on top of that he started having some more symptoms and ended having cardiac catheterization done which showed significant right coronary artery lesion as well as significant LAD and diagonal branch lesion.  He was also found to have severe mitral regurgitation.  Eventually decision has been made to pursue surgical intervention.  His past medical history is also significant for BiV pacing initially he received DDD pacemaker but then developed cardiomyopathy in his system was upgraded to BiV pacing that happened in 2019.  After that he showed improvement left ventricle ejection fraction, however then later his ejection fraction deteriorated which we felt was related to ischemia.  On 06/02/2020 he underwent triple vessel bypass graft with LIMA to LAD, SVG to posterior descending artery, SVG to diagonal branch.  He also had mitral valve ring placed which is 28 mm Edwards ring, Maze procedure was done at that time and clipping of left atrial appendage was performed.  He is post surgical recovery time was complicated by acute renal failure which was less likely ATN, he recovered completely from that. ?He comes today 2 months for follow-up overall he said he is getting his strength back.  He said he is doing very well.  Denies have any chest pain tightness squeezing pressure burning chest.  He played tennis and practice different sports with no major difficulties.  He is  agility also improved quite significantly.  Overall I think he is doing quite well.  He only describes episodes where he gets dizzy sometimes that last for few seconds.  It happened when he exercise sometimes when he simply sits.  He took the liberty and calm down Jardiance dose and that seems to be helping a lot. ? ?Past Medical History:  ?Diagnosis Date  ? Acute on chronic systolic heart failure ([place])   ? Attention deficit disorder (ADD)   ? Cardiac pacemaker 05/10/2017  ? Cardiomyopathy ([place])   ? Chest pain 06/05/2020  ? Chronic apical periodontitis   ? Complete heart block ([place]) 06/10/2015  ? Coronary artery disease   ? 40% left main, 50% LAD, 50% RCA recent cardiac catheterization from March 2019  ? Dyslipidemia   ? Encounter for preoperative dental examination   ? Essential hypertension   ? Gingival recession, generalized   ? Gingivitis   ? Hyperlipidemia   ? Long term (current) use of anticoagulants 06/22/2020  ? Mitral regurgitation   ? NSTEMI (non-ST elevated myocardial infarction) ([place]) 06/06/2020  ? Pacemaker reprogramming/check 06/10/2015  ? Peripheral vascular disease ([place])   ? Noncritical bilateral carotid arterial disease  ? Pneumonia   ? S/P CABG x 3 06/10/2020  ? LIMA to LAD, SVG to Diag, SVG to PDA  ? S/P Maze operation for atrial fibrillation 06/10/2020  ? Left atrial lesion set using bipolar radiofrequency and cryothermy ablation with clipping of LA appendage  ? S/P mitral valve repair 06/10/2020  ? Annuloplasty with a M28 mm  Model [address]  SN: [id number]    ? Status post biventricular pacemaker 07/16/2017  ? Syncope and collapse   ? Teeth missing   ? ? ?Past Surgical History:  ?Procedure Laterality Date  ? Basel Cell Cancer    ? BIV UPGRADE N/A 06/25/2017  ? Procedure: BIV UPGRADE;  Surgeon: Constance Haw, MD;  Location: [place] [facility];  Service: Cardiovascular;  Laterality: N/A;  ? BUBBLE STUDY  06/03/2020  ? Procedure: BUBBLE STUDY;  Surgeon: [name], MD;   Location: [place];  Service: Cardiology;;  ? CATARACT EXTRACTION    ? CLIPPING OF ATRIAL APPENDAGE  06/10/2020  ? Procedure: CLIPPING OF ATRIAL APPENDAGE USING  ATRICURE PRO2 CLIP SIZE 45MM;  Surgeon: Rexene Alberts, MD;  Location[facility] OR;  Service: Open Heart Surgery;;  ? CORONARY ARTERY BYPASS GRAFT N/A 06/10/2020  ? Procedure: CORONARY ARTERY BYPASS GRAFTING (CABG) X THREE USING LEFT INTERNAL MAMMARY ARTERY AND BILATERAL LEGS GREATER SAPHENOUS VEIN HARVESTED ENDOSCOPICALLY;  Surgeon: Rexene Alberts, MD;  Location: [place];  Service: Open Heart Surgery;  Laterality: N/A;  ? INSERT / REPLACE / REMOVE PACEMAKER    ? INTRAVASCULAR PRESSURE WIRE/FFR STUDY N/A 05/28/2020  ? Procedure: INTRAVASCULAR PRESSURE WIRE/FFR STUDY;  Surgeon: [name], MD;  Location: [place] [facility];  Service: Cardiovascular;  Laterality: N/A;  ? KNEE SURGERY Right   ? ACL Reconstruction  ? LEFT HEART CATH AND CORONARY ANGIOGRAPHY N/A 05/10/2017  ? Procedure: LEFT HEART CATH AND CORONARY ANGIOGRAPHY;  Surgeon: [name], MD;  Location: [place] [facility];  Service: Cardiovascular;  Laterality: N/A;  ? MAZE N/A 06/10/2020  ? Procedure: MAZE;  Surgeon: Rexene Alberts, MD;  Location: [place];  Service: Open Heart Surgery;  Laterality: N/A;  ? MITRAL VALVE REPAIR N/A 06/10/2020  ? Procedure: MITRAL VALVE REPAIR USING CARPENTIER MCCARTHY-ADAMS RING SIZE 28MM;  Surgeon: Rexene Alberts, MD;  Location: [place];  Service: Open Heart Surgery;  Laterality: N/A;  ? RIGHT/LEFT HEART CATH AND CORONARY ANGIOGRAPHY N/A 05/28/2020  ? Procedure: RIGHT/LEFT HEART CATH AND CORONARY ANGIOGRAPHY;  Surgeon: [name], MD;  Location: [place] [facility];  Service: Cardiovascular;  Laterality: N/A;  ? TEE WITHOUT CARDIOVERSION N/A 06/03/2020  ? Procedure: TRANSESOPHAGEAL ECHOCARDIOGRAM (TEE);  Surgeon: [name], MD;  Location: [place];  Service: Cardiology;  Laterality: N/A;  ? TEE WITHOUT CARDIOVERSION N/A 06/10/2020  ? Procedure:  TRANSESOPHAGEAL ECHOCARDIOGRAM (TEE);  Surgeon: Rexene Alberts, MD;  Location: [place];  Service: Open Heart Surgery;  Laterality: N/A;  ? VASECTOMY    ? ? ?Current Medications: ?Current Meds  ?Medication Sig  ? amphetamine-dextroamphetamine (ADDERALL) 10 MG tablet Take 10 mg by mouth as needed (ADHD).  ? apixaban (ELIQUIS) 5 MG TABS tablet TAKE 1 TABLET(5 MG) BY MOUTH TWICE DAILY (Patient taking differently: Take 5 mg by mouth 2 (two) times daily.)  ? atorvastatin (LIPITOR) 80 MG tablet Take 1 tablet (80 mg total) by mouth daily.  ? B-12, Methylcobalamin, 1000 MCG SUBL Place 1,000 mcg under the tongue every morning.  ? empagliflozin (JARDIANCE) 10 MG TABS tablet Take 1 tablet (10 mg total) by mouth daily before breakfast.  ? losartan (COZAAR) 25 MG tablet Take 1 tablet (25 mg total) by mouth daily.  ? metoprolol succinate (TOPROL XL) 25 MG 24 hr tablet Take 0.5 tablets (12.5 mg total) by mouth at bedtime.  ? nitroGLYCERIN (NITROSTAT) 0.4 MG SL tablet Place 1 tablet (0.4 mg  total) under the tongue every 5 (five) minutes x 3 doses as needed for chest pain.  ? spironolactone (ALDACTONE) 25 MG tablet Take 0.5 tablets (12.5 mg total) by mouth daily.  ? tadalafil (CIALIS) 10 MG tablet Take 10 mg by mouth daily.  ?  ? ?Allergies:   Ciprofloxacin, Crestor [rosuvastatin calcium], and Nexletol [bempedoic acid]  ? ?Social History  ? ?Socioeconomic History  ? Marital status: Married  ?  Spouse name: Not on file  ? Number of children: Not on file  ? Years of education: Not on file  ? Highest education level: Not on file  ?Occupational History  ? Occupation: Engineer, drilling  ?Tobacco Use  ? Smoking status: Former  ?  Types: Cigarettes  ? Smokeless tobacco: Never  ?Vaping Use  ? Vaping Use: Never used  ?Substance and Sexual Activity  ? Alcohol use: Yes  ?  Comment: Wine with dinner  ? Drug use: Never  ? Sexual activity: Not on file  ?Other Topics Concern  ? Not on file  ?Social History Narrative  ? Not on file   ? ?Social Determinants of Health  ? ?Financial Resource Strain: Not on file  ?Food Insecurity: No Food Insecurity  ? Worried About Charity fundraiser in the Last Year: Never true  ? Ran Out of Food in the Last [place]

## 2021-05-18 NOTE — Patient Instructions (Signed)
Medication Instructions:  ?Your physician recommends that you continue on your current medications as directed. Please refer to the Current Medication list given to you today. ? ?*If you need a refill on your cardiac medications before your next appointment, please call your pharmacy* ? ? ?Lab Work: ?NONE ?If you have labs (blood work) drawn today and your tests are completely normal, you will receive your results only by: ?MyChart Message (if you have MyChart) OR ?A paper copy in the mail ?If you have any lab test that is abnormal or we need to change your treatment, we will call you to review the results. ? ? ?Testing/Procedures: ?Your physician has requested that you have a carotid duplex. This test is an ultrasound of the carotid arteries in your neck. It looks at blood flow through these arteries that supply the brain with blood. Allow one hour for this exam. There are no restrictions or special instructions. ? ? ? ?Follow-Up: ?At [facility], you and your health needs are our priority.  As part of our continuing mission to provide you with exceptional heart care, we have created designated Provider Care Teams.  These Care Teams include your primary Cardiologist (physician) and Advanced Practice Providers (APPs -  Physician Assistants and Nurse Practitioners) who all work together to provide you with the care you need, when you need it. ? ?We recommend signing up for the patient portal called "MyChart".  Sign up information is provided on this After Visit Summary.  MyChart is used to connect with patients for Virtual Visits (Telemedicine).  Patients are able to view lab/test results, encounter notes, upcoming appointments, etc.  Non-urgent messages can be sent to your provider as well.   ?To learn more about what you can do with MyChart, go to [email or web address].   ? ?Your next appointment:   ?5 month(s) ? ?The format for your next appointment:   ?In Person ? ?Provider:   ?Jenne Campus, MD   ? ? ?Other Instructions ?  ?

## 2021-05-29 ENCOUNTER — Encounter: Payer: Self-pay | Admitting: Cardiology

## 2021-05-30 ENCOUNTER — Ambulatory Visit (INDEPENDENT_AMBULATORY_CARE_PROVIDER_SITE_OTHER): Payer: Medicare Other

## 2021-05-30 DIAGNOSIS — I255 Ischemic cardiomyopathy: Secondary | ICD-10-CM

## 2021-05-30 DIAGNOSIS — R0989 Other specified symptoms and signs involving the circulatory and respiratory systems: Secondary | ICD-10-CM | POA: Diagnosis not present

## 2021-05-30 DIAGNOSIS — E785 Hyperlipidemia, unspecified: Secondary | ICD-10-CM

## 2021-05-30 DIAGNOSIS — I2510 Atherosclerotic heart disease of native coronary artery without angina pectoris: Secondary | ICD-10-CM

## 2021-06-03 ENCOUNTER — Telehealth: Payer: Self-pay

## 2021-06-03 NOTE — Telephone Encounter (Signed)
Patient notified of results.

## 2021-06-03 NOTE — Telephone Encounter (Signed)
-----   Message from Park Liter, MD sent at 05/31/2021  8:18 AM EDT ----- ?Carotic ultrasound showed up to 59% stenosis bilaterally.  Continue present management ?

## 2021-06-20 ENCOUNTER — Ambulatory Visit (INDEPENDENT_AMBULATORY_CARE_PROVIDER_SITE_OTHER): Payer: Medicare Other

## 2021-06-20 DIAGNOSIS — I442 Atrioventricular block, complete: Secondary | ICD-10-CM | POA: Diagnosis not present

## 2021-06-21 LAB — CUP PACEART REMOTE DEVICE CHECK
Battery Remaining Longevity: 56 mo
Battery Remaining Percentage: 54 %
Battery Voltage: 2.98 V
Brady Statistic AP VP Percent: 1 %
Brady Statistic AP VS Percent: 1 %
Brady Statistic AS VP Percent: 99 %
Brady Statistic AS VS Percent: 1 %
Brady Statistic RA Percent Paced: 1 %
Date Time Interrogation Session: 20230508022837
Implantable Lead Implant Date: 20130812
Implantable Lead Implant Date: 20190513
Implantable Lead Implant Date: 20190513
Implantable Lead Location: 753858
Implantable Lead Location: 753859
Implantable Lead Location: 753860
Implantable Pulse Generator Implant Date: 20190513
Lead Channel Impedance Value: 330 Ohm
Lead Channel Impedance Value: 380 Ohm
Lead Channel Impedance Value: 950 Ohm
Lead Channel Pacing Threshold Amplitude: 0.75 V
Lead Channel Pacing Threshold Amplitude: 1 V
Lead Channel Pacing Threshold Amplitude: 1.125 V
Lead Channel Pacing Threshold Pulse Width: 0.2 ms
Lead Channel Pacing Threshold Pulse Width: 0.4 ms
Lead Channel Pacing Threshold Pulse Width: 0.6 ms
Lead Channel Sensing Intrinsic Amplitude: 12 mV
Lead Channel Sensing Intrinsic Amplitude: 5 mV
Lead Channel Setting Pacing Amplitude: 1.75 V
Lead Channel Setting Pacing Amplitude: 2 V
Lead Channel Setting Pacing Amplitude: 2.125
Lead Channel Setting Pacing Pulse Width: 0.4 ms
Lead Channel Setting Pacing Pulse Width: 0.6 ms
Lead Channel Setting Sensing Sensitivity: 4 mV
Pulse Gen Model: 3562
Pulse Gen Serial Number: 9431568

## 2021-07-02 ENCOUNTER — Other Ambulatory Visit: Payer: Self-pay | Admitting: Cardiology

## 2021-07-04 NOTE — Telephone Encounter (Signed)
Prescription refill request for Eliquis received. Indication:afib Last office visit:06/16/21 Scr:0.91 Age: 76 Weight: 70.4kg

## 2021-07-08 NOTE — Progress Notes (Signed)
Remote pacemaker transmission.   

## 2021-07-12 DIAGNOSIS — E7800 Pure hypercholesterolemia, unspecified: Secondary | ICD-10-CM | POA: Diagnosis not present

## 2021-07-12 DIAGNOSIS — Z79899 Other long term (current) drug therapy: Secondary | ICD-10-CM | POA: Diagnosis not present

## 2021-07-12 DIAGNOSIS — B351 Tinea unguium: Secondary | ICD-10-CM | POA: Diagnosis not present

## 2021-09-01 DIAGNOSIS — Z5181 Encounter for therapeutic drug level monitoring: Secondary | ICD-10-CM | POA: Diagnosis not present

## 2021-09-05 DIAGNOSIS — M7712 Lateral epicondylitis, left elbow: Secondary | ICD-10-CM | POA: Diagnosis not present

## 2021-09-09 ENCOUNTER — Other Ambulatory Visit: Payer: Self-pay

## 2021-09-12 ENCOUNTER — Encounter: Payer: Self-pay | Admitting: Cardiology

## 2021-09-13 NOTE — Telephone Encounter (Signed)
Patient states Dr. Sung Amabile first prescribed Jardiance at '10mg'$  but Dr Raliegh Ip then cut him down to 5 mg, and he needs a refill. Patient would like to stop taking it if possible as he does not feel like it makes a difference. Please call [phone]. He would like Dr. Raliegh Ip to advise on this if possible (not Dr. Jacinto Reap)  Thank you!

## 2021-09-14 ENCOUNTER — Telehealth: Payer: Self-pay

## 2021-09-14 MED ORDER — EMPAGLIFLOZIN 10 MG PO TABS: 10.0000 mg | ORAL_TABLET | Freq: Every day | ORAL | 11 refills | Status: DC

## 2021-09-14 NOTE — Telephone Encounter (Signed)
Refilled Jardiance '10mg'$  1/2 pill daily. Sent to [facility].

## 2021-09-19 ENCOUNTER — Ambulatory Visit (INDEPENDENT_AMBULATORY_CARE_PROVIDER_SITE_OTHER): Payer: Medicare Other

## 2021-09-19 DIAGNOSIS — I442 Atrioventricular block, complete: Secondary | ICD-10-CM | POA: Diagnosis not present

## 2021-09-20 ENCOUNTER — Encounter: Payer: Self-pay | Admitting: Cardiology

## 2021-09-20 LAB — CUP PACEART REMOTE DEVICE CHECK
Battery Remaining Longevity: 54 mo
Battery Remaining Percentage: 51 %
Battery Voltage: 2.98 V
Brady Statistic AP VP Percent: 1 %
Brady Statistic AP VS Percent: 1 %
Brady Statistic AS VP Percent: 99 %
Brady Statistic AS VS Percent: 1 %
Brady Statistic RA Percent Paced: 1 %
Date Time Interrogation Session: 20230807031036
Implantable Lead Implant Date: 20130812
Implantable Lead Implant Date: 20190513
Implantable Lead Implant Date: 20190513
Implantable Lead Location: 753858
Implantable Lead Location: 753859
Implantable Lead Location: 753860
Implantable Pulse Generator Implant Date: 20190513
Lead Channel Impedance Value: 1025 Ohm
Lead Channel Impedance Value: 330 Ohm
Lead Channel Impedance Value: 380 Ohm
Lead Channel Pacing Threshold Amplitude: 0.875 V
Lead Channel Pacing Threshold Amplitude: 0.875 V
Lead Channel Pacing Threshold Amplitude: 1.25 V
Lead Channel Pacing Threshold Pulse Width: 0.2 ms
Lead Channel Pacing Threshold Pulse Width: 0.4 ms
Lead Channel Pacing Threshold Pulse Width: 0.6 ms
Lead Channel Sensing Intrinsic Amplitude: 12 mV
Lead Channel Sensing Intrinsic Amplitude: 5 mV
Lead Channel Setting Pacing Amplitude: 1.875
Lead Channel Setting Pacing Amplitude: 2 V
Lead Channel Setting Pacing Amplitude: 2.25 V
Lead Channel Setting Pacing Pulse Width: 0.4 ms
Lead Channel Setting Pacing Pulse Width: 0.6 ms
Lead Channel Setting Sensing Sensitivity: 4 mV
Pulse Gen Model: 3562
Pulse Gen Serial Number: 9431568

## 2021-09-22 ENCOUNTER — Other Ambulatory Visit: Payer: Self-pay

## 2021-09-22 DIAGNOSIS — R0609 Other forms of dyspnea: Secondary | ICD-10-CM

## 2021-09-23 IMAGING — DX DG CHEST 1V PORT
2 series · 2 of 2 positions shown · non-contrast
Comparison: 06/26/2017

CLINICAL DATA: Chest pain and shortness of breath.

EXAM:
PORTABLE CHEST 1 VIEW

[chest ap (1 of 2)]
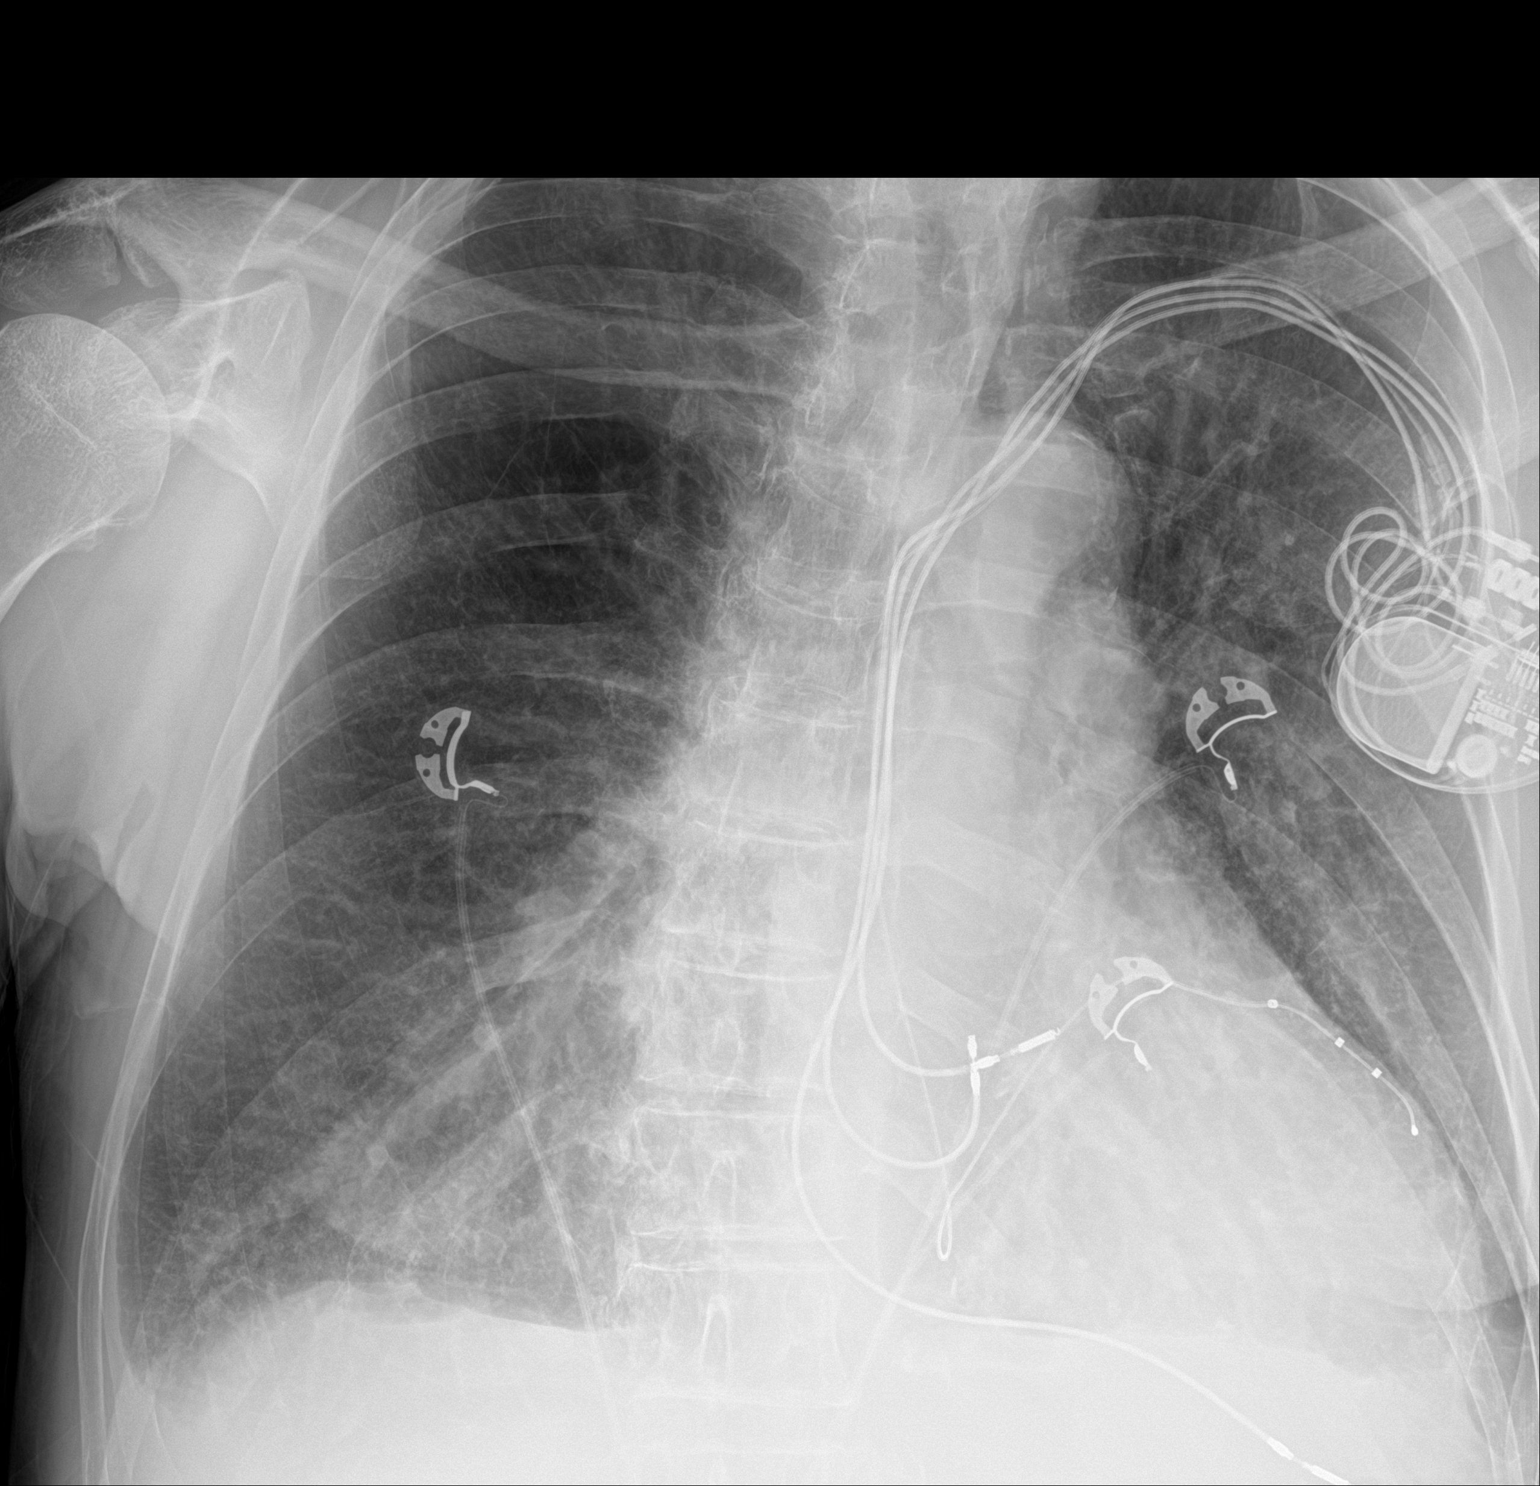

[chest ap (2 of 2)]
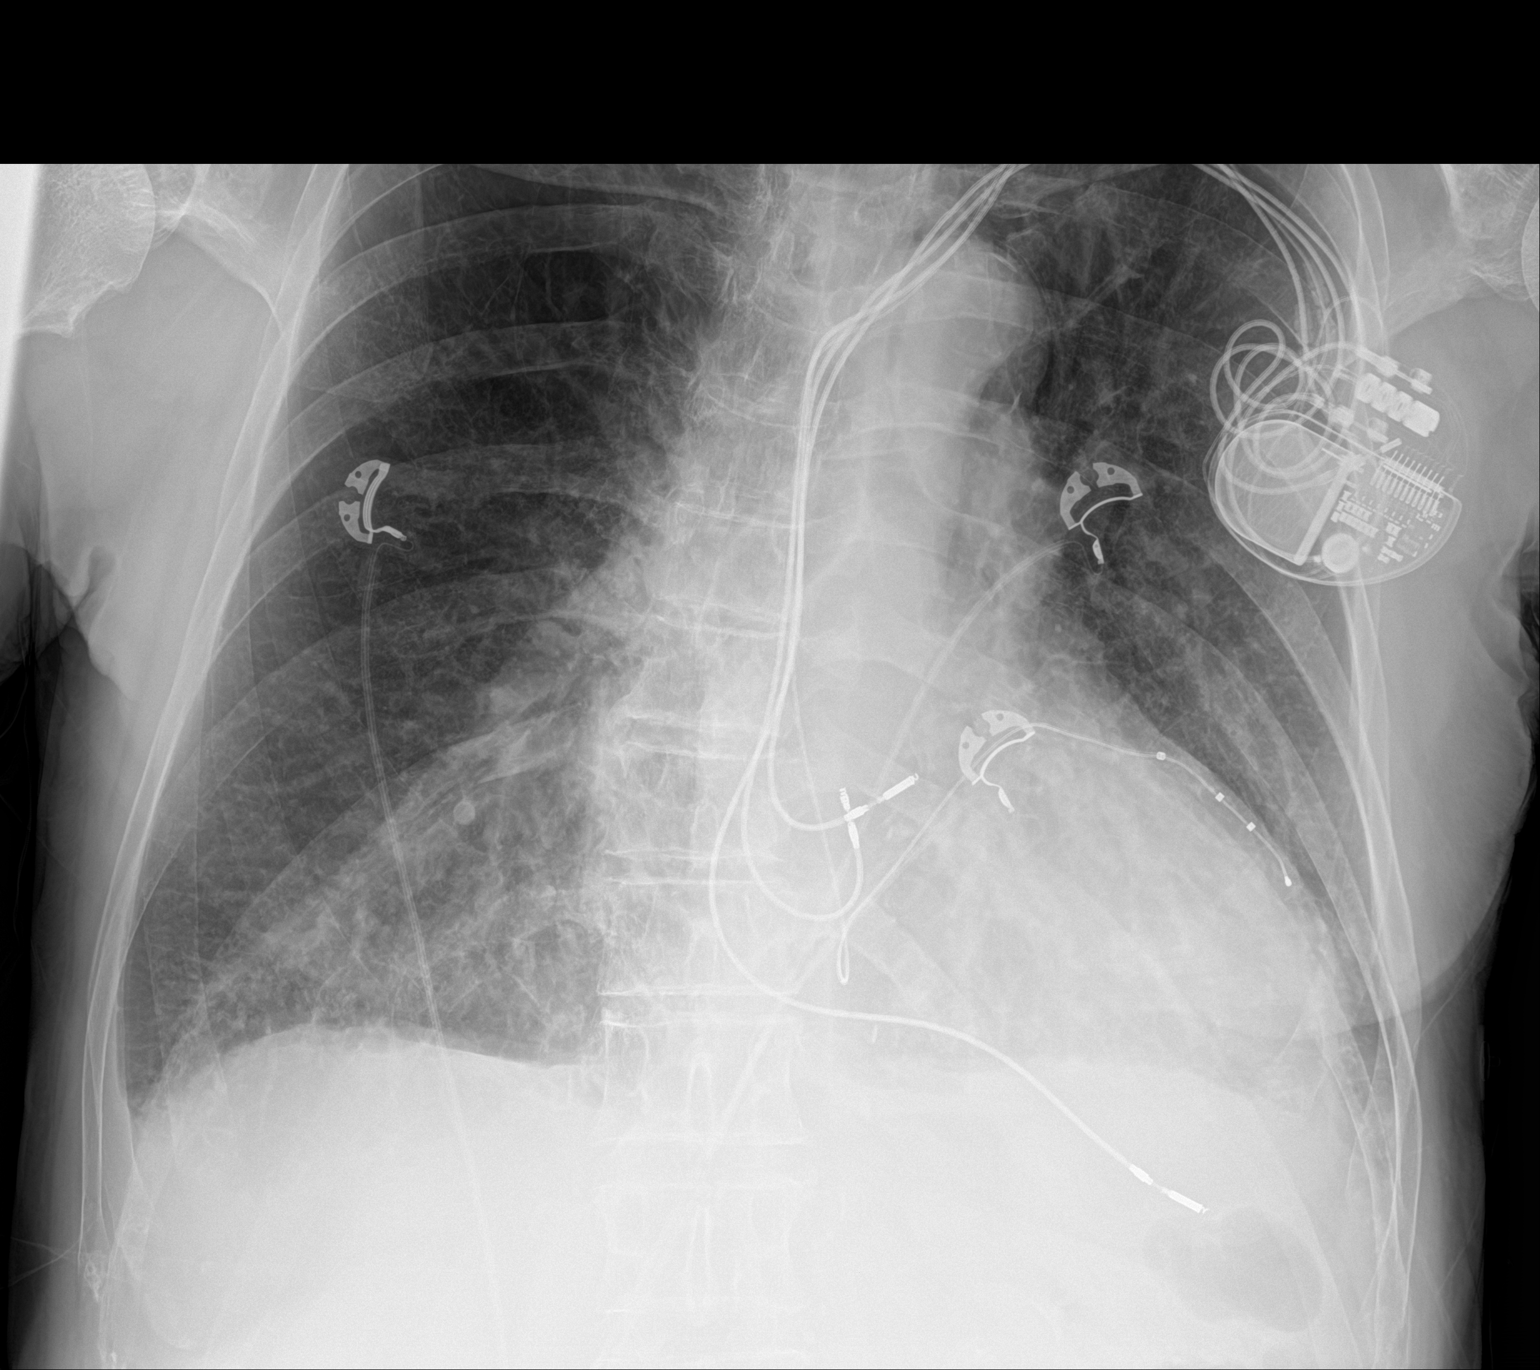

[2 of 2 positions shown; findings below may reference images not displayed]

FINDINGS: Patient's LEFT-sided transvenous pacemaker with leads to the RIGHT
atrium, RIGHT ventricle, and coronary sinus. Heart size is
accentuated by technique and mildly enlarged. Mildly prominent
interstitial markings consistent with mild edema. Trace bilateral
pleural effusions. There is asymmetric density in the MEDIAL RIGHT
lung base, consistent with early infiltrate.
IMPRESSION: 1. Cardiomegaly and mild interstitial edema.
2. RIGHT lower lobe infiltrate.

## 2021-10-11 ENCOUNTER — Ambulatory Visit: Payer: Medicare Other | Attending: Cardiology

## 2021-10-11 DIAGNOSIS — R0609 Other forms of dyspnea: Secondary | ICD-10-CM | POA: Insufficient documentation

## 2021-10-11 LAB — ECHOCARDIOGRAM COMPLETE
AV Mean grad: 2 mmHg
AV Peak grad: 3.9 mmHg
Ao pk vel: 0.99 m/s
Area-P 1/2: 2.6 cm2
MV M vel: 3.6 m/s
MV Peak grad: 51.8 mmHg
S' Lateral: 3.1 cm

## 2021-10-14 ENCOUNTER — Telehealth: Payer: Self-pay

## 2021-10-14 NOTE — Telephone Encounter (Signed)
-----   Message from Park Liter, MD sent at 10/13/2021 11:29 AM EDT ----- Echocardiogram looks good.  Ejection fraction normalized, mitral valve status postrepair looks satisfactory.  Overall things are looking better.

## 2021-10-14 NOTE — Telephone Encounter (Signed)
Patient notified of results.

## 2021-10-20 NOTE — Progress Notes (Signed)
Remote pacemaker transmission.   

## 2021-10-28 ENCOUNTER — Encounter: Payer: Self-pay | Admitting: Cardiology

## 2021-10-28 ENCOUNTER — Ambulatory Visit: Payer: Medicare Other | Attending: Cardiology | Admitting: Cardiology

## 2021-10-28 VITALS — BP 154/74 | HR 50 | Ht 71.0 in | Wt 160.0 lb

## 2021-10-28 DIAGNOSIS — I442 Atrioventricular block, complete: Secondary | ICD-10-CM | POA: Diagnosis not present

## 2021-10-28 DIAGNOSIS — I739 Peripheral vascular disease, unspecified: Secondary | ICD-10-CM

## 2021-10-28 DIAGNOSIS — I255 Ischemic cardiomyopathy: Secondary | ICD-10-CM | POA: Diagnosis not present

## 2021-10-28 DIAGNOSIS — Z951 Presence of aortocoronary bypass graft: Secondary | ICD-10-CM

## 2021-10-28 DIAGNOSIS — Z9889 Other specified postprocedural states: Secondary | ICD-10-CM | POA: Diagnosis not present

## 2021-10-28 DIAGNOSIS — Z8679 Personal history of other diseases of the circulatory system: Secondary | ICD-10-CM

## 2021-10-28 DIAGNOSIS — Z950 Presence of cardiac pacemaker: Secondary | ICD-10-CM | POA: Diagnosis not present

## 2021-10-28 DIAGNOSIS — E782 Mixed hyperlipidemia: Secondary | ICD-10-CM | POA: Diagnosis not present

## 2021-10-28 NOTE — Progress Notes (Unsigned)
Cardiology Office Note:    Date:  10/28/2021   ID:  Ian Mcgee, DOB 09-06-1945, MRN [id number]  PCP:  Mateo Flow, MD  Cardiologist:  Jenne Campus, MD    Referring MD: Mateo Flow, MD   No chief complaint on file. Doing fine  History of Present Illness:    Ian Mcgee is a 76 y.o. male  with past medical history significant for coronary artery disease, cardiomyopathy, peripheral vascular disease.  I been following for years and recently he developed diminished left ventricle ejection fraction on top of that he started having some more symptoms and up having cardiac catheterization done which showed significant right coronary artery lesion as well as significant LAD and diagonal branch lesion.  He was also found to have severe mitral regurgitation.  Eventually decision has been made to pursue surgical intervention.  His past medical history is also significant for BiV pacing initially he got DDD pacemaker but then developed cardiomyopathy in his system was upgraded to BiV pacing that happened in 2019.  After that he showed improvement left ventricle ejection fraction, however then later his ejection fraction deteriorated which we felt was related to ischemia.  On 06/02/2020 he underwent triple vessel bypass graft with LIMA to LAD, SVG to posterior descending artery, SVG to diagonal branch.  He also got mitral valve ring placed which is 28 mm Edwards ring, Maze procedure was done at that time and clipping of left atrial appendage was performed.  He is post surgical recovery time was complicated by acute renal failure which was less likely ATN, he recovered completely from that. He is coming today to my office for follow-up overall he is doing well and feeling great.  He exercised on the regular basis with no difficulties his blood pressure is holding quite well and overall he is feeling significantly better   Past Medical History:  Diagnosis Date   Acute on chronic systolic  heart failure (HCC)    Attention deficit disorder (ADD)    Cardiac pacemaker 05/10/2017   Cardiomyopathy ([place])    Chest pain 06/05/2020   Chronic apical periodontitis    Complete heart block ([place]) 06/10/2015   Coronary artery disease    40% left main, 50% LAD, 50% RCA recent cardiac catheterization from March 2019   Dyslipidemia    Encounter for preoperative dental examination    Essential hypertension    Gingival recession, generalized    Gingivitis    Hyperlipidemia    Long term (current) use of anticoagulants 06/22/2020   Mitral regurgitation    NSTEMI (non-ST elevated myocardial infarction) ([place]) 06/06/2020   Pacemaker reprogramming/check 06/10/2015   Peripheral vascular disease ([place])    Noncritical bilateral carotid arterial disease   Pneumonia    S/P CABG x 3 06/10/2020   LIMA to LAD, SVG to Diag, SVG to PDA   S/P Maze operation for atrial fibrillation 06/10/2020   Left atrial lesion set using bipolar radiofrequency and cryothermy ablation with clipping of LA appendage   S/P mitral valve repair 06/10/2020   Annuloplasty with a M28 mm Model 4100  [place]  SN: [id number]     Status post biventricular pacemaker 07/16/2017   Syncope and collapse    Teeth missing     Past Surgical History:  Procedure Laterality Date   Basel Cell Cancer     BIV UPGRADE N/A 06/25/2017   Procedure: BIV UPGRADE;  Surgeon: Constance Haw, MD;  Location: [place] [facility];  Service:  Cardiovascular;  Laterality: N/A;   BUBBLE STUDY  06/03/2020   Procedure: BUBBLE STUDY;  Surgeon: [name], MD;  Location: [facility];  Service: Cardiology;;   CATARACT EXTRACTION     CLIPPING OF ATRIAL APPENDAGE  06/10/2020   Procedure: CLIPPING OF ATRIAL APPENDAGE USING  ATRICURE PRO2 CLIP SIZE 45MM;  Surgeon: Rexene Alberts, MD;  Location: [facility] OR;  Service: Open Heart Surgery;;   CORONARY ARTERY BYPASS GRAFT N/A 06/10/2020   Procedure: CORONARY ARTERY BYPASS GRAFTING (CABG) X THREE  USING LEFT INTERNAL MAMMARY ARTERY AND BILATERAL LEGS GREATER SAPHENOUS VEIN HARVESTED ENDOSCOPICALLY;  Surgeon: Rexene Alberts, MD;  Location: [place];  Service: Open Heart Surgery;  Laterality: N/A;   INSERT / REPLACE / REMOVE PACEMAKER     INTRAVASCULAR PRESSURE WIRE/FFR STUDY N/A 05/28/2020   Procedure: INTRAVASCULAR PRESSURE WIRE/FFR STUDY;  Surgeon: [name], MD;  Location: [place] [facility];  Service: Cardiovascular;  Laterality: N/A;   KNEE SURGERY Right    ACL Reconstruction   LEFT HEART CATH AND CORONARY ANGIOGRAPHY N/A 05/10/2017   Procedure: LEFT HEART CATH AND CORONARY ANGIOGRAPHY;  Surgeon: [name], MD;  Location: [place] [facility];  Service: Cardiovascular;  Laterality: N/A;   MAZE N/A 06/10/2020   Procedure: MAZE;  Surgeon: Rexene Alberts, MD;  Location: [place];  Service: Open Heart Surgery;  Laterality: N/A;   MITRAL VALVE REPAIR N/A 06/10/2020   Procedure: MITRAL VALVE REPAIR USING CARPENTIER MCCARTHY-ADAMS RING SIZE 28MM;  Surgeon: Rexene Alberts, MD;  Location: [place];  Service: Open Heart Surgery;  Laterality: N/A;   RIGHT/LEFT HEART CATH AND CORONARY ANGIOGRAPHY N/A 05/28/2020   Procedure: RIGHT/LEFT HEART CATH AND CORONARY ANGIOGRAPHY;  Surgeon: [name], MD;  Location: [place] [facility];  Service: Cardiovascular;  Laterality: N/A;   TEE WITHOUT CARDIOVERSION N/A 06/03/2020   Procedure: TRANSESOPHAGEAL ECHOCARDIOGRAM (TEE);  Surgeon: [name], MD;  Location: [place];  Service: Cardiology;  Laterality: N/A;   TEE WITHOUT CARDIOVERSION N/A 06/10/2020   Procedure: TRANSESOPHAGEAL ECHOCARDIOGRAM (TEE);  Surgeon: Rexene Alberts, MD;  Location: [place];  Service: Open Heart Surgery;  Laterality: N/A;   VASECTOMY      Current Medications: Current Meds  Medication Sig   amphetamine-dextroamphetamine (ADDERALL) 10 MG tablet Take 10 mg by mouth as needed (ADHD).   apixaban (ELIQUIS) 5 MG TABS tablet Take 1 tablet by mouth twice daily   B-12,  Methylcobalamin, 1000 MCG SUBL Place 1,000 mcg under the tongue every morning.   empagliflozin (JARDIANCE) 10 MG TABS tablet Take 1 tablet (10 mg total) by mouth daily before breakfast.   losartan (COZAAR) 25 MG tablet Take 1 tablet (25 mg total) by mouth daily.   metoprolol succinate (TOPROL XL) 25 MG 24 hr tablet Take 0.5 tablets (12.5 mg total) by mouth at bedtime.   nitroGLYCERIN (NITROSTAT) 0.4 MG SL tablet Place 1 tablet (0.4 mg total) under the tongue every 5 (five) minutes x 3 doses as needed for chest pain.   spironolactone (ALDACTONE) 25 MG tablet Take 0.5 tablets (12.5 mg total) by mouth daily.   tadalafil (CIALIS) 10 MG tablet Take 10 mg by mouth daily.   TERBINAFINE HCL PO Take 250 mg by mouth daily.     Allergies:   Ciprofloxacin, Crestor [rosuvastatin calcium], and Nexletol [bempedoic acid]   Social History   Socioeconomic History   Marital status: Married    Spouse name: Not on file   Number of children: Not on file  Years of education: Not on file   Highest education level: Not on file  Occupational History   Occupation: [employer]  Tobacco Use   Smoking status: Former    Types: Cigarettes   Smokeless tobacco: Never  Vaping Use   Vaping Use: Never used  Substance and Sexual Activity   Alcohol use: Yes    Comment: Wine with dinner   Drug use: Never   Sexual activity: Not on file  Other Topics Concern   Not on file  Social History Narrative   Not on file   Social Determinants of Health   Financial Resource Strain: Not on file  Food Insecurity: No Food Insecurity (06/29/2020)   Hunger Vital Sign    Worried About Running Out of Food in the Last Year: Never true    [place] in the Last Year: Never true  Transportation Needs: No Transportation Needs (06/29/2020)   PRAPARE - Hydrologist (Medical): No    Lack of Transportation (Non-Medical): No  Physical Activity: Not on file  Stress: Not on file  Social  Connections: Not on file     Family History: The patient's family history includes CAD in his brother, father, and mother; Healthy in his brother; Hypertension in his mother; Leukemia in his father; Osteoporosis in his mother; Ovarian cancer in his sister; Rheumatic fever in his brother. ROS:   Please see the history of present illness.    All 14 point review of systems negative except as described per history of present illness  EKGs/Labs/Other Studies Reviewed:      Recent Labs: 12/15/2020: BUN 17; Creatinine, Ser 0.91; Potassium 4.4; Sodium 141  Recent Lipid Panel    Component Value Date/Time   CHOL 181 12/15/2020 0835   TRIG 45 12/15/2020 0835   HDL 87 12/15/2020 0835   CHOLHDL 2.1 12/15/2020 0835   CHOLHDL 2.8 05/29/2020 0100   VLDL 18 05/29/2020 0100   LDLCALC 85 12/15/2020 0835    Physical Exam:    VS:  BP (!) 154/74 (BP Location: Left Arm, Patient Position: Sitting)   Pulse (!) 50   Ht '5\' 11"'$  (1.803 m)   Wt 160 lb (72.6 kg)   SpO2 99%   BMI 22.32 kg/m     Wt Readings from Last 3 Encounters:  10/28/21 160 lb (72.6 kg)  05/18/21 155 lb 3.2 oz (70.4 kg)  12/14/20 155 lb (70.3 kg)     GEN:  Well nourished, well developed in no acute distress HEENT: Normal NECK: No JVD; No carotid bruits LYMPHATICS: No lymphadenopathy CARDIAC: RRR, no murmurs, no rubs, no gallops RESPIRATORY:  Clear to auscultation without rales, wheezing or rhonchi  ABDOMEN: Soft, non-tender, non-distended MUSCULOSKELETAL:  No edema; No deformity  SKIN: Warm and dry LOWER EXTREMITIES: no swelling NEUROLOGIC:  Alert and oriented x 3 PSYCHIATRIC:  Normal affect   ASSESSMENT:    1. Ischemic cardiomyopathy   2. Peripheral vascular disease ([place])   3. Complete heart block (HCC)   4. Cardiac pacemaker   5. S/P Maze operation for atrial fibrillation   6. S/P mitral valve repair   7. S/P CABG x 3   8. Mixed hyperlipidemia    PLAN:    In order of problems listed above:  History of  ischemic cardiomyopathy, last echocardiogram showed preserved left ventricle ejection fraction.  We had a long discussion today about the role of all medication he is taking I told him that the purposes of those  medications multiple for example he is taking Aldactone and according to the fact that his ejection fraction normal she does not have any congestive heart failure we could diuretic discontinue that medication but at the same time because of his elevation of blood pressure that medication help with the blood pressure management.  We had a discussion regarding Jardiance with regarding losartan as well as metoprolol.  Basically reach conclusion that since he improved so dramatically and so doing so well with present management we will simply continue History of paroxysmal atrial fibrillation, he is anticoagulated.  However he asked me about potentially discontinuation of this medication.  I did review his pacemaker interrogation no recent episodes of atrial fibrillation, he also got atrial appendage clip.  I will ask her to have more updated data from the device and if that is negative we will consider discontinuation of Eliquis. Peripheral vascular disease.  Last carotid ultrasound show no critical lesions on his neck.  We will continue monitoring. Dyslipidemia.  He was given medications for his fungus on the toes and he was asked to temporarily stop his Lipitor.  He got 3 more weeks of that medication and will ask him to go back on Lipitor.   Medication Adjustments/Labs and Tests Ordered: Current medicines are reviewed at length with the patient today.  Concerns regarding medicines are outlined above.  No orders of the defined types were placed in this encounter.  Medication changes: No orders of the defined types were placed in this encounter.   Signed, Park Liter, MD, [facility] 10/28/2021 10:53 AM    [place]

## 2021-10-28 NOTE — Patient Instructions (Signed)
Medication Instructions:  Your physician recommends that you continue on your current medications as directed. Please refer to the Current Medication list given to you today.  *If you need a refill on your cardiac medications before your next appointment, please call your pharmacy*   Lab Work: None If you have labs (blood work) drawn today and your tests are completely normal, you will receive your results only by: MyChart Message (if you have MyChart) OR A paper copy in the mail If you have any lab test that is abnormal or we need to change your treatment, we will call you to review the results.   Testing/Procedures: None   Follow-Up: At [facility], you and your health needs are our priority.  As part of our continuing mission to provide you with exceptional heart care, we have created designated Provider Care Teams.  These Care Teams include your primary Cardiologist (physician) and Advanced Practice Providers (APPs -  Physician Assistants and Nurse Practitioners) who all work together to provide you with the care you need, when you need it.  We recommend signing up for the patient portal called "MyChart".  Sign up information is provided on this After Visit Summary.  MyChart is used to connect with patients for Virtual Visits (Telemedicine).  Patients are able to view lab/test results, encounter notes, upcoming appointments, etc.  Non-urgent messages can be sent to your provider as well.   To learn more about what you can do with MyChart, go to [email or web address].    Your next appointment:   6 month(s)  The format for your next appointment:   In Person  Provider:   Jahaan Krasowski, MD    Other Instructions None  Important Information About Sugar        

## 2021-10-31 ENCOUNTER — Telehealth: Payer: Self-pay

## 2021-10-31 NOTE — Telephone Encounter (Signed)
-----   Message from Stanton Kidney, RN sent at 10/28/2021  5:47 PM EDT -----  ----- Message ----- From: Park Liter, MD Sent: 10/28/2021  10:55 AM EDT To: Stanton Kidney, RN  Sherry, could you please do remote interrogation of this patient device.  I would like to know if he still does some recurrences of atrial fibrillation if not probably will discontinue his anticoagulation.  Thank you

## 2021-10-31 NOTE — Telephone Encounter (Signed)
Called patient to send manual transmission.   No answer, Left message to send transmission. Direct phone number left.

## 2021-11-01 NOTE — Telephone Encounter (Signed)
Sent mychart message requesting remote transmission.

## 2021-11-05 ENCOUNTER — Encounter: Payer: Self-pay | Admitting: Cardiology

## 2021-11-11 ENCOUNTER — Encounter: Payer: Self-pay | Admitting: Cardiology

## 2021-11-14 ENCOUNTER — Telehealth: Payer: Self-pay

## 2021-11-14 DIAGNOSIS — Z23 Encounter for immunization: Secondary | ICD-10-CM | POA: Diagnosis not present

## 2021-11-14 NOTE — Telephone Encounter (Signed)
Eliquis discontinued per Dr. Wendy Poet note.

## 2021-11-28 IMAGING — CR DG CHEST 2V
2 series · 2 of 2 positions shown · non-contrast
Comparison: 07/05/2020

CLINICAL DATA: Status post CABG

EXAM:
CHEST - 2 VIEW

[w chest pa]
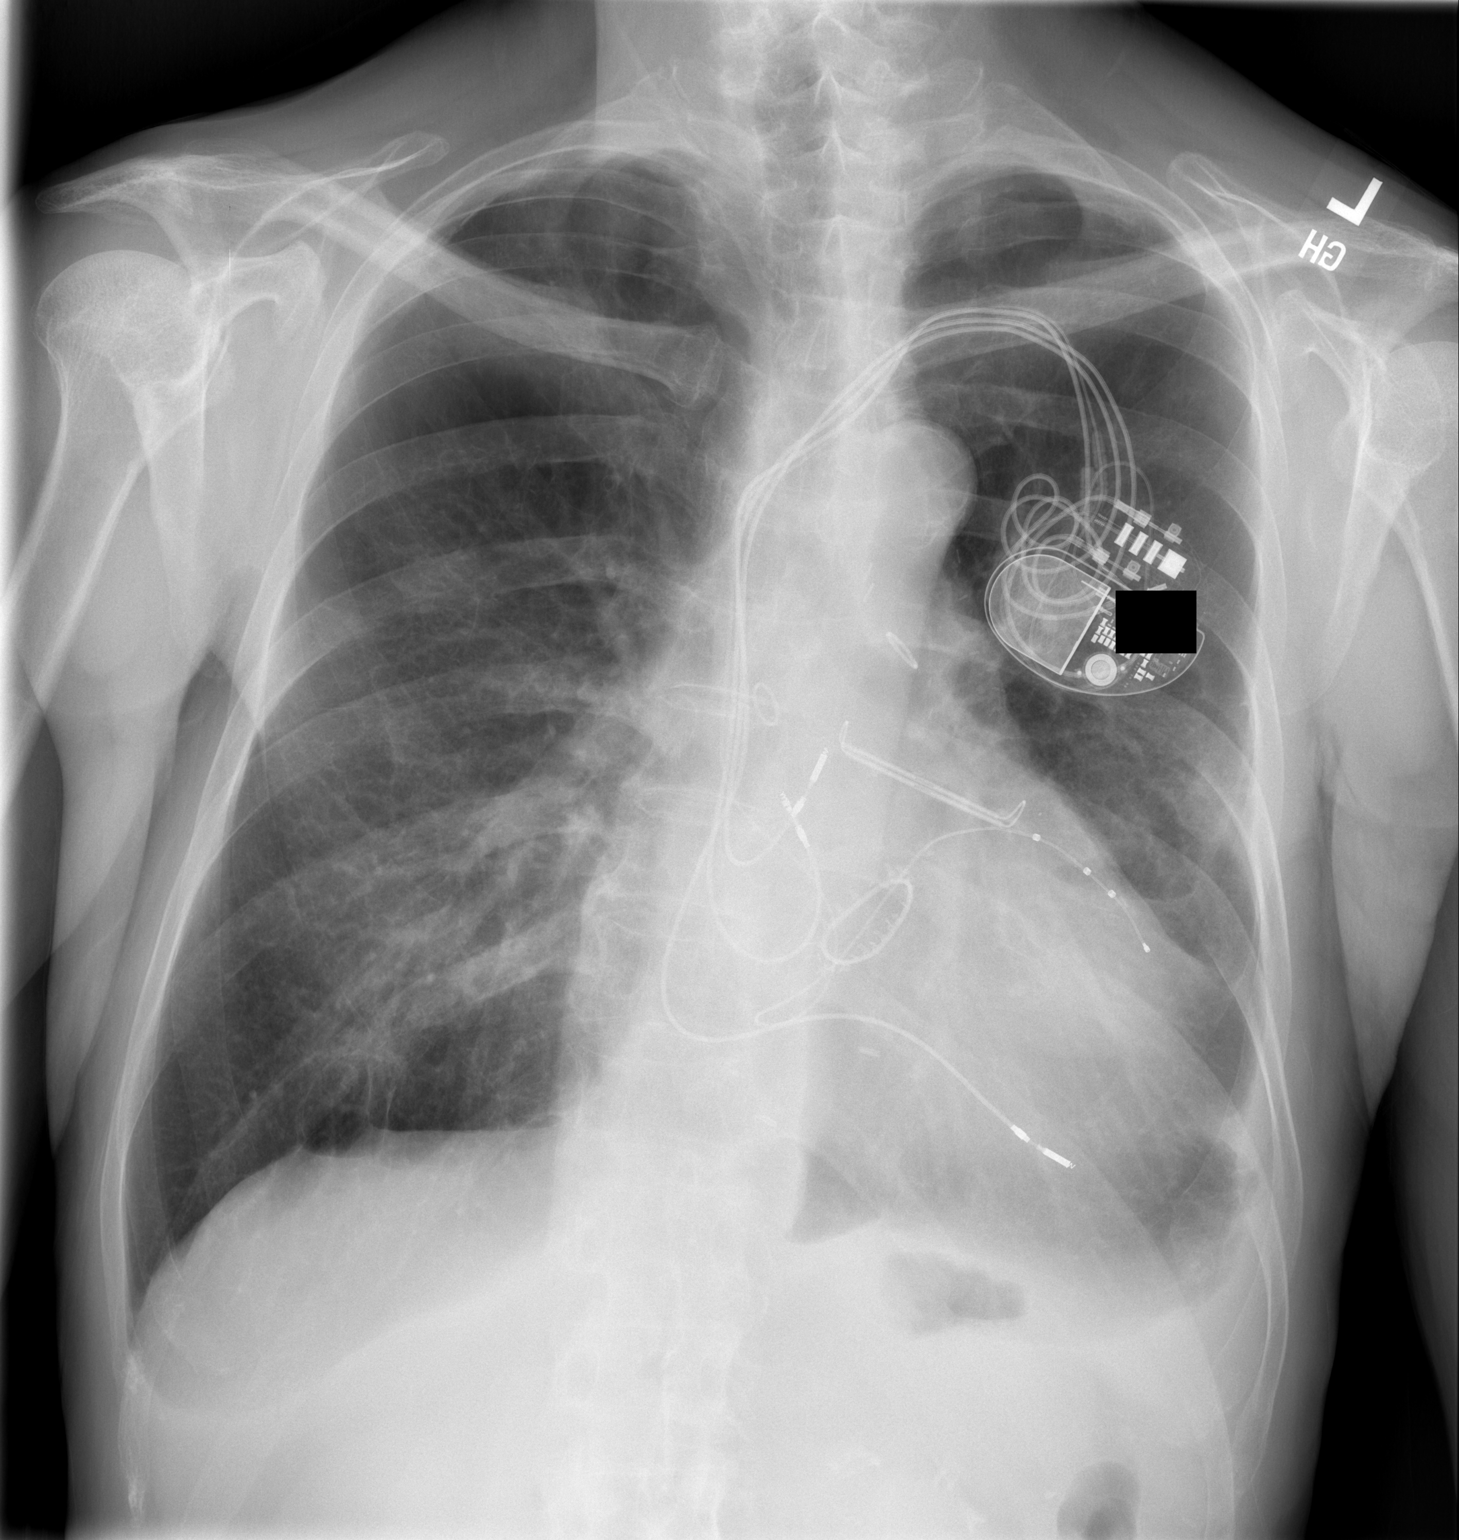

[w chest lat]
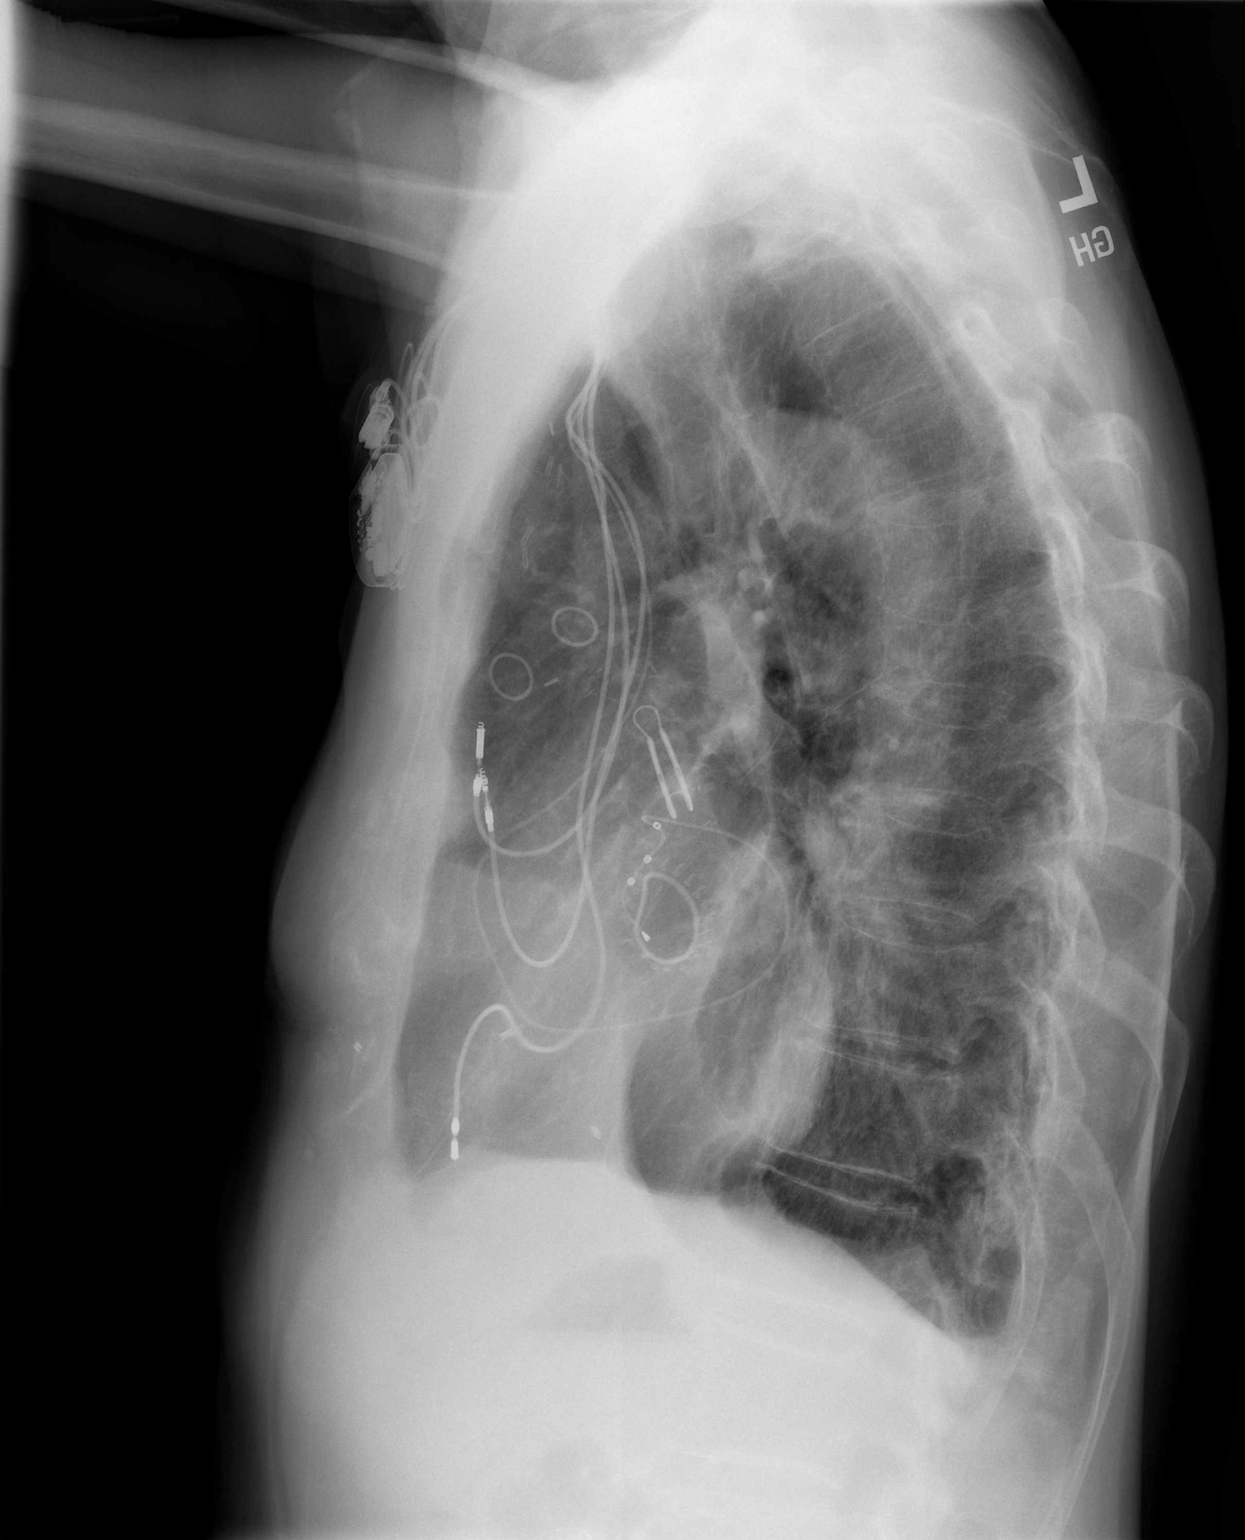

[2 of 2 positions shown; findings below may reference images not displayed]

FINDINGS: Cardiomegaly status post median sternotomy and CABG with valvular
annulus prosthesis. Left atrial appendage clip. Near complete
interval resolution of previously seen left pleural effusion, now
trace. The lungs are normally aerated. The osseous structures are
unremarkable.
IMPRESSION: Near complete interval resolution of previously seen left pleural
effusion, now trace.

## 2021-12-19 ENCOUNTER — Ambulatory Visit (INDEPENDENT_AMBULATORY_CARE_PROVIDER_SITE_OTHER): Payer: Medicare Other

## 2021-12-19 DIAGNOSIS — I442 Atrioventricular block, complete: Secondary | ICD-10-CM | POA: Diagnosis not present

## 2021-12-19 DIAGNOSIS — Z23 Encounter for immunization: Secondary | ICD-10-CM | POA: Diagnosis not present

## 2021-12-20 DIAGNOSIS — H04123 Dry eye syndrome of bilateral lacrimal glands: Secondary | ICD-10-CM | POA: Diagnosis not present

## 2021-12-20 DIAGNOSIS — H353131 Nonexudative age-related macular degeneration, bilateral, early dry stage: Secondary | ICD-10-CM | POA: Diagnosis not present

## 2021-12-20 LAB — CUP PACEART REMOTE DEVICE CHECK
Battery Remaining Longevity: 50 mo
Battery Remaining Percentage: 48 %
Battery Voltage: 2.96 V
Brady Statistic AP VP Percent: 1 %
Brady Statistic AP VS Percent: 1 %
Brady Statistic AS VP Percent: 99 %
Brady Statistic AS VS Percent: 1 %
Brady Statistic RA Percent Paced: 1 %
Date Time Interrogation Session: 20231106025237
Implantable Lead Connection Status: 753985
Implantable Lead Connection Status: 753985
Implantable Lead Connection Status: 753985
Implantable Lead Implant Date: 20130812
Implantable Lead Implant Date: 20190513
Implantable Lead Implant Date: 20190513
Implantable Lead Location: 753858
Implantable Lead Location: 753859
Implantable Lead Location: 753860
Implantable Pulse Generator Implant Date: 20190513
Lead Channel Impedance Value: 330 Ohm
Lead Channel Impedance Value: 380 Ohm
Lead Channel Impedance Value: 980 Ohm
Lead Channel Pacing Threshold Amplitude: 0.875 V
Lead Channel Pacing Threshold Amplitude: 1.125 V
Lead Channel Pacing Threshold Amplitude: 1.125 V
Lead Channel Pacing Threshold Pulse Width: 0.2 ms
Lead Channel Pacing Threshold Pulse Width: 0.4 ms
Lead Channel Pacing Threshold Pulse Width: 0.6 ms
Lead Channel Sensing Intrinsic Amplitude: 12 mV
Lead Channel Sensing Intrinsic Amplitude: 5 mV
Lead Channel Setting Pacing Amplitude: 1.875
Lead Channel Setting Pacing Amplitude: 2.125
Lead Channel Setting Pacing Amplitude: 2.125
Lead Channel Setting Pacing Pulse Width: 0.4 ms
Lead Channel Setting Pacing Pulse Width: 0.6 ms
Lead Channel Setting Sensing Sensitivity: 4 mV
Pulse Gen Model: 3562
Pulse Gen Serial Number: 9431568

## 2022-01-20 DIAGNOSIS — D519 Vitamin B12 deficiency anemia, unspecified: Secondary | ICD-10-CM | POA: Diagnosis not present

## 2022-01-20 DIAGNOSIS — B351 Tinea unguium: Secondary | ICD-10-CM | POA: Diagnosis not present

## 2022-01-20 DIAGNOSIS — Z79899 Other long term (current) drug therapy: Secondary | ICD-10-CM | POA: Diagnosis not present

## 2022-01-20 DIAGNOSIS — E7800 Pure hypercholesterolemia, unspecified: Secondary | ICD-10-CM | POA: Diagnosis not present

## 2022-01-20 DIAGNOSIS — Z0000 Encounter for general adult medical examination without abnormal findings: Secondary | ICD-10-CM | POA: Diagnosis not present

## 2022-01-24 NOTE — Progress Notes (Signed)
Remote pacemaker transmission.   

## 2022-02-11 ENCOUNTER — Other Ambulatory Visit: Payer: Self-pay | Admitting: Cardiology

## 2022-02-24 ENCOUNTER — Encounter: Payer: Self-pay | Admitting: Cardiology

## 2022-03-15 ENCOUNTER — Telehealth: Payer: Self-pay

## 2022-03-15 MED ORDER — LOSARTAN POTASSIUM 25 MG PO TABS: 25.0000 mg | ORAL_TABLET | Freq: Two times a day (BID) | ORAL | 3 refills | Status: DC

## 2022-03-15 NOTE — Telephone Encounter (Signed)
Left My Chart message for pt to increase Losartan '25mg'$  BID and check blood work in 1 week.

## 2022-03-20 ENCOUNTER — Ambulatory Visit: Payer: Medicare Other

## 2022-03-20 ENCOUNTER — Other Ambulatory Visit: Payer: Self-pay | Admitting: Cardiology

## 2022-03-20 DIAGNOSIS — I442 Atrioventricular block, complete: Secondary | ICD-10-CM

## 2022-03-21 ENCOUNTER — Other Ambulatory Visit: Payer: Self-pay

## 2022-03-21 LAB — CUP PACEART REMOTE DEVICE CHECK
Battery Remaining Longevity: 47 mo
Battery Remaining Percentage: 45 %
Battery Voltage: 2.96 V
Brady Statistic AP VP Percent: 1 %
Brady Statistic AP VS Percent: 1 %
Brady Statistic AS VP Percent: 99 %
Brady Statistic AS VS Percent: 1 %
Brady Statistic RA Percent Paced: 1 %
Date Time Interrogation Session: 20240205033527
Implantable Lead Connection Status: 753985
Implantable Lead Connection Status: 753985
Implantable Lead Connection Status: 753985
Implantable Lead Implant Date: 20130812
Implantable Lead Implant Date: 20190513
Implantable Lead Implant Date: 20190513
Implantable Lead Location: 753858
Implantable Lead Location: 753859
Implantable Lead Location: 753860
Implantable Pulse Generator Implant Date: 20190513
Lead Channel Impedance Value: 300 Ohm
Lead Channel Impedance Value: 380 Ohm
Lead Channel Impedance Value: 950 Ohm
Lead Channel Pacing Threshold Amplitude: 0.875 V
Lead Channel Pacing Threshold Amplitude: 1 V
Lead Channel Pacing Threshold Amplitude: 1 V
Lead Channel Pacing Threshold Pulse Width: 0.2 ms
Lead Channel Pacing Threshold Pulse Width: 0.4 ms
Lead Channel Pacing Threshold Pulse Width: 0.6 ms
Lead Channel Sensing Intrinsic Amplitude: 5 mV
Lead Channel Sensing Intrinsic Amplitude: 8.6 mV
Lead Channel Setting Pacing Amplitude: 1.875
Lead Channel Setting Pacing Amplitude: 2 V
Lead Channel Setting Pacing Amplitude: 2 V
Lead Channel Setting Pacing Pulse Width: 0.4 ms
Lead Channel Setting Pacing Pulse Width: 0.6 ms
Lead Channel Setting Sensing Sensitivity: 4 mV
Pulse Gen Model: 3562
Pulse Gen Serial Number: 9431568

## 2022-03-21 MED ORDER — METOPROLOL SUCCINATE ER 25 MG PO TB24: 12.5000 mg | ORAL_TABLET | Freq: Every day | ORAL | 3 refills | Status: DC

## 2022-03-24 ENCOUNTER — Other Ambulatory Visit: Payer: Self-pay

## 2022-03-24 DIAGNOSIS — I10 Essential (primary) hypertension: Secondary | ICD-10-CM

## 2022-03-25 LAB — BASIC METABOLIC PANEL
BUN/Creatinine Ratio: 25 — ABNORMAL HIGH (ref 10–24)
BUN: 25 mg/dL (ref 8–27)
CO2: 25 mmol/L (ref 20–29)
Calcium: 9 mg/dL (ref 8.6–10.2)
Chloride: 104 mmol/L (ref 96–106)
Creatinine, Ser: 1 mg/dL (ref 0.76–1.27)
Glucose: 93 mg/dL (ref 70–99)
Potassium: 4.3 mmol/L (ref 3.5–5.2)
Sodium: 142 mmol/L (ref 134–144)
eGFR: 78 mL/min/{1.73_m2} (ref >59)

## 2022-03-27 ENCOUNTER — Telehealth: Payer: Self-pay

## 2022-03-27 NOTE — Telephone Encounter (Signed)
Results reviewed with pt as per Dr. Krasowski's note.  Pt verbalized understanding and had no additional questions. Routed to PCP  

## 2022-04-03 DIAGNOSIS — M1712 Unilateral primary osteoarthritis, left knee: Secondary | ICD-10-CM | POA: Diagnosis not present

## 2022-05-04 NOTE — Progress Notes (Signed)
Remote pacemaker transmission.   

## 2022-05-23 ENCOUNTER — Other Ambulatory Visit: Payer: Self-pay | Admitting: Cardiology

## 2022-05-23 DIAGNOSIS — M25562 Pain in left knee: Secondary | ICD-10-CM | POA: Diagnosis not present

## 2022-05-29 DIAGNOSIS — M23322 Other meniscus derangements, posterior horn of medial meniscus, left knee: Secondary | ICD-10-CM | POA: Diagnosis not present

## 2022-05-29 DIAGNOSIS — M1712 Unilateral primary osteoarthritis, left knee: Secondary | ICD-10-CM | POA: Diagnosis not present

## 2022-06-19 ENCOUNTER — Ambulatory Visit (INDEPENDENT_AMBULATORY_CARE_PROVIDER_SITE_OTHER): Payer: Medicare Other

## 2022-06-19 DIAGNOSIS — I442 Atrioventricular block, complete: Secondary | ICD-10-CM

## 2022-06-19 LAB — CUP PACEART REMOTE DEVICE CHECK
Battery Remaining Longevity: 43 mo
Battery Remaining Percentage: 42 %
Battery Voltage: 2.96 V
Brady Statistic AP VP Percent: 1 %
Brady Statistic AP VS Percent: 1 %
Brady Statistic AS VP Percent: 99 %
Brady Statistic AS VS Percent: 1 %
Brady Statistic RA Percent Paced: 1 %
Date Time Interrogation Session: 20240506020013
Implantable Lead Connection Status: 753985
Implantable Lead Connection Status: 753985
Implantable Lead Connection Status: 753985
Implantable Lead Implant Date: 20130812
Implantable Lead Implant Date: 20190513
Implantable Lead Implant Date: 20190513
Implantable Lead Location: 753858
Implantable Lead Location: 753859
Implantable Lead Location: 753860
Implantable Pulse Generator Implant Date: 20190513
Lead Channel Impedance Value: 300 Ohm
Lead Channel Impedance Value: 380 Ohm
Lead Channel Impedance Value: 910 Ohm
Lead Channel Pacing Threshold Amplitude: 0.875 V
Lead Channel Pacing Threshold Amplitude: 1 V
Lead Channel Pacing Threshold Amplitude: 1.125 V
Lead Channel Pacing Threshold Pulse Width: 0.2 ms
Lead Channel Pacing Threshold Pulse Width: 0.4 ms
Lead Channel Pacing Threshold Pulse Width: 0.6 ms
Lead Channel Sensing Intrinsic Amplitude: 5 mV
Lead Channel Sensing Intrinsic Amplitude: 9.3 mV
Lead Channel Setting Pacing Amplitude: 1.875
Lead Channel Setting Pacing Amplitude: 2 V
Lead Channel Setting Pacing Amplitude: 2.125
Lead Channel Setting Pacing Pulse Width: 0.4 ms
Lead Channel Setting Pacing Pulse Width: 0.6 ms
Lead Channel Setting Sensing Sensitivity: 4 mV
Pulse Gen Model: 3562
Pulse Gen Serial Number: 9431568

## 2022-06-29 ENCOUNTER — Telehealth: Payer: Self-pay | Admitting: Cardiology

## 2022-06-29 DIAGNOSIS — I340 Nonrheumatic mitral (valve) insufficiency: Secondary | ICD-10-CM

## 2022-06-29 NOTE — Telephone Encounter (Signed)
Patient has appt scheduled in July, he would like to know if Dr. Kirtland Bouchard would like for him to get an echo before his appt  Thank you

## 2022-07-04 NOTE — Telephone Encounter (Signed)
Patient notified and order on file. I message front desk for scheduling.

## 2022-07-15 ENCOUNTER — Other Ambulatory Visit: Payer: Self-pay | Admitting: Cardiology

## 2022-07-18 NOTE — Progress Notes (Signed)
Remote pacemaker transmission.   

## 2022-07-23 ENCOUNTER — Encounter: Payer: Self-pay | Admitting: Cardiology

## 2022-08-03 ENCOUNTER — Ambulatory Visit: Payer: Medicare Other | Attending: Cardiology

## 2022-08-03 DIAGNOSIS — I340 Nonrheumatic mitral (valve) insufficiency: Secondary | ICD-10-CM | POA: Diagnosis not present

## 2022-08-03 LAB — ECHOCARDIOGRAM COMPLETE
Area-P 1/2: 2.21 cm2
MV M vel: 4.84 m/s
MV Peak grad: 93.7 mmHg
S' Lateral: 3.6 cm

## 2022-08-08 ENCOUNTER — Other Ambulatory Visit: Payer: Self-pay | Admitting: Cardiology

## 2022-08-19 ENCOUNTER — Other Ambulatory Visit: Payer: Self-pay | Admitting: Cardiology

## 2022-09-01 ENCOUNTER — Ambulatory Visit: Payer: Medicare Other | Attending: Cardiology | Admitting: Cardiology

## 2022-09-01 ENCOUNTER — Encounter: Payer: Self-pay | Admitting: Cardiology

## 2022-09-01 VITALS — BP 124/72 | HR 77 | Ht 71.0 in | Wt 164.8 lb

## 2022-09-01 DIAGNOSIS — I2510 Atherosclerotic heart disease of native coronary artery without angina pectoris: Secondary | ICD-10-CM | POA: Insufficient documentation

## 2022-09-01 DIAGNOSIS — Z950 Presence of cardiac pacemaker: Secondary | ICD-10-CM

## 2022-09-01 DIAGNOSIS — I255 Ischemic cardiomyopathy: Secondary | ICD-10-CM | POA: Diagnosis not present

## 2022-09-01 DIAGNOSIS — Z8679 Personal history of other diseases of the circulatory system: Secondary | ICD-10-CM | POA: Diagnosis not present

## 2022-09-01 DIAGNOSIS — I340 Nonrheumatic mitral (valve) insufficiency: Secondary | ICD-10-CM

## 2022-09-01 DIAGNOSIS — Z9889 Other specified postprocedural states: Secondary | ICD-10-CM | POA: Insufficient documentation

## 2022-09-01 DIAGNOSIS — E782 Mixed hyperlipidemia: Secondary | ICD-10-CM | POA: Diagnosis not present

## 2022-09-01 DIAGNOSIS — I10 Essential (primary) hypertension: Secondary | ICD-10-CM | POA: Diagnosis not present

## 2022-09-01 MED ORDER — ASPIRIN 81 MG PO TBEC: 81.0000 mg | DELAYED_RELEASE_TABLET | Freq: Every day | ORAL | Status: AC

## 2022-09-01 NOTE — Patient Instructions (Signed)
Medication Instructions:   STOP: Aldactone  START: 81mg  Enteric Coated Aspirin 1 tablet daily   Lab Work: Your physician recommends that you return for lab work in: when fasting You need to have labs done when you are fasting.  You can come Monday through Friday 8:30 am to 12:00 pm and 1:15 to 4:30. You do not need to make an appointment as the order has already been placed. The labs you are going to have done are BMET, Lipids.    Testing/Procedures: Your physician has requested that you have a carotid duplex. This test is an ultrasound of the carotid arteries in your neck. It looks at blood flow through these arteries that supply the brain with blood. Allow one hour for this exam. There are no restrictions or special instructions.    Follow-Up: At [facility], you and your health needs are our priority.  As part of our continuing mission to provide you with exceptional heart care, we have created designated Provider Care Teams.  These Care Teams include your primary Cardiologist (physician) and Advanced Practice Providers (APPs -  Physician Assistants and Nurse Practitioners) who all work together to provide you with the care you need, when you need it.  We recommend signing up for the patient portal called "MyChart".  Sign up information is provided on this After Visit Summary.  MyChart is used to connect with patients for Virtual Visits (Telemedicine).  Patients are able to view lab/test results, encounter notes, upcoming appointments, etc.  Non-urgent messages can be sent to your provider as well.   To learn more about what you can do with MyChart, go to [email or web address].    Your next appointment:   6 month(s)  The format for your next appointment:   In Person  Provider:   Gypsy Balsam, MD    Other Instructions NA

## 2022-09-01 NOTE — Progress Notes (Signed)
Cardiology Office Note:    Date:  09/01/2022   ID:  Ian Mcgee, DOB 1945/05/02, MRN [id number]  PCP:  Lise Auer, MD  Cardiologist:  Gypsy Balsam, MD    Referring MD: Lise Auer, MD   Chief Complaint  Patient presents with   Medication Management   Results    Echo   Dizziness    History of Present Illness:    Ian Mcgee is a 77 y.o. male    with past medical history significant for coronary artery disease, cardiomyopathy, peripheral vascular disease.  I been following for years and recently he developed diminished left ventricle ejection fraction on top of that he started having some more symptoms and up having cardiac catheterization done which showed significant right coronary artery lesion as well as significant LAD and diagonal branch lesion.  He was also found to have severe mitral regurgitation.  Eventually decision has been made to pursue surgical intervention.  His past medical history is also significant for BiV pacing initially he got DDD pacemaker but then developed cardiomyopathy in his system was upgraded to BiV pacing that happened in 2019.  After that he showed improvement left ventricle ejection fraction, however then later his ejection fraction deteriorated which we felt was related to ischemia.  On 06/02/2020 he underwent triple vessel bypass graft with LIMA to LAD, SVG to posterior descending artery, SVG to diagonal branch.  He also got mitral valve ring placed which is 28 mm Edwards ring, Maze procedure was done at that time and clipping of left atrial appendage was performed.  He is post surgical recovery time was complicated by acute renal failure which was less likely ATN, he recovered completely from that.  Doing well, still active, describe dizziness after exercise, not during.No CP, SOB, TIA CVA  Past Medical History:  Diagnosis Date   Acute on chronic systolic heart failure (HCC)    Attention deficit disorder (ADD)    Cardiac pacemaker  05/10/2017   Cardiomyopathy (HCC)    Chest pain 06/05/2020   Chronic apical periodontitis    Complete heart block (HCC) 06/10/2015   Coronary artery disease    40% left main, 50% LAD, 50% RCA recent cardiac catheterization from March 2019   Dyslipidemia    Encounter for preoperative dental examination    Essential hypertension    Gingival recession, generalized    Gingivitis    Hyperlipidemia    Long term (current) use of anticoagulants 06/22/2020   Mitral regurgitation    NSTEMI (non-ST elevated myocardial infarction) (HCC) 06/06/2020   Pacemaker reprogramming/check 06/10/2015   Peripheral vascular disease (HCC)    Noncritical bilateral carotid arterial disease   Pneumonia    S/P CABG x 3 06/10/2020   LIMA to LAD, SVG to Diag, SVG to PDA   S/P Maze operation for atrial fibrillation 06/10/2020   Left atrial lesion set using bipolar radiofrequency and cryothermy ablation with clipping of LA appendage   S/P mitral valve repair 06/10/2020   Annuloplasty with a M28 mm Model 4100  Ian Mcgee  SN: [id number]     Status post biventricular pacemaker 07/16/2017   Syncope and collapse    Teeth missing     Past Surgical History:  Procedure Laterality Date   Basel Cell Cancer     BIV UPGRADE N/A 06/25/2017   Procedure: BIV UPGRADE;  Surgeon: Regan Lemming, MD;  Location: [facility] INVASIVE CV LAB;  Service: Cardiovascular;  Laterality: N/A;   BUBBLE STUDY  06/03/2020  Procedure: BUBBLE STUDY;  Surgeon: Parke Poisson, MD;  Location: [facility] ENDOSCOPY;  Service: Cardiology;;   CATARACT EXTRACTION     CLIPPING OF ATRIAL APPENDAGE  06/10/2020   Procedure: CLIPPING OF ATRIAL APPENDAGE USING  ATRICURE PRO2 CLIP SIZE ;  Surgeon: Purcell Nails, MD;  Location: [facility] OR;  Service: Open Heart Surgery;;   CORONARY ARTERY BYPASS GRAFT N/A 06/10/2020   Procedure: CORONARY ARTERY BYPASS GRAFTING (CABG) X THREE USING LEFT INTERNAL MAMMARY ARTERY AND BILATERAL LEGS GREATER SAPHENOUS VEIN  HARVESTED ENDOSCOPICALLY;  Surgeon: Purcell Nails, MD;  Location: [facility] OR;  Service: Open Heart Surgery;  Laterality: N/A;   CORONARY PRESSURE/FFR STUDY N/A 05/28/2020   Procedure: INTRAVASCULAR PRESSURE WIRE/FFR STUDY;  Surgeon: Tonny Bollman, MD;  Location: [facility] INVASIVE CV LAB;  Service: Cardiovascular;  Laterality: N/A;   INSERT / REPLACE / REMOVE PACEMAKER     KNEE SURGERY Right    ACL Reconstruction   LEFT HEART CATH AND CORONARY ANGIOGRAPHY N/A 05/10/2017   Procedure: LEFT HEART CATH AND CORONARY ANGIOGRAPHY;  Surgeon: Marykay Lex, MD;  Location: [facility] INVASIVE CV LAB;  Service: Cardiovascular;  Laterality: N/A;   MAZE N/A 06/10/2020   Procedure: MAZE;  Surgeon: Purcell Nails, MD;  Location: [facility] OR;  Service: Open Heart Surgery;  Laterality: N/A;   MITRAL VALVE REPAIR N/A 06/10/2020   Procedure: MITRAL VALVE REPAIR USING CARPENTIER MCCARTHY-ADAMS RING SIZE ;  Surgeon: Purcell Nails, MD;  Location: [facility] OR;  Service: Open Heart Surgery;  Laterality: N/A;   RIGHT/LEFT HEART CATH AND CORONARY ANGIOGRAPHY N/A 05/28/2020   Procedure: RIGHT/LEFT HEART CATH AND CORONARY ANGIOGRAPHY;  Surgeon: Tonny Bollman, MD;  Location: [facility] INVASIVE CV LAB;  Service: Cardiovascular;  Laterality: N/A;   TEE WITHOUT CARDIOVERSION N/A 06/03/2020   Procedure: TRANSESOPHAGEAL ECHOCARDIOGRAM (TEE);  Surgeon: Parke Poisson, MD;  Location: [facility] ENDOSCOPY;  Service: Cardiology;  Laterality: N/A;   TEE WITHOUT CARDIOVERSION N/A 06/10/2020   Procedure: TRANSESOPHAGEAL ECHOCARDIOGRAM (TEE);  Surgeon: Purcell Nails, MD;  Location: [facility] OR;  Service: Open Heart Surgery;  Laterality: N/A;   VASECTOMY      Current Medications: Current Meds  Medication Sig   amoxicillin (AMOXIL) 500 MG capsule Take 2,000 mg by mouth as needed (prior to dental procedures).   amphetamine-dextroamphetamine (ADDERALL) 10 MG tablet Take 10 mg by mouth as needed (ADHD).   aspirin EC 81 MG tablet Take 1 tablet (81 mg total) by mouth  daily. Swallow whole.   atorvastatin (LIPITOR) 80 MG tablet TAKE 1 TABLET(80 MG) BY MOUTH DAILY (Patient taking differently: Take 80 mg by mouth daily.)   B-12, Methylcobalamin, 1000 MCG SUBL Place 1,000 mcg under the tongue every morning.   cyanocobalamin (VITAMIN B12) 1000 MCG tablet Take 1,000 mcg by mouth daily.   losartan (COZAAR) 25 MG tablet Take 1 tablet (25 mg total) by mouth 2 (two) times daily.   metoprolol succinate (TOPROL-XL) 25 MG 24 hr tablet Take 0.5 tablets (12.5 mg total) by mouth at bedtime.   Multiple Vitamins-Minerals (PRESERVISION AREDS PO) Take 1 tablet by mouth in the morning and at bedtime.   Niacinamide-Zn-Cu-Methfo-Se-Cr (NICOTINAMIDE PO) Take 300 mg by mouth daily.   nitroGLYCERIN (NITROSTAT) 0.4 MG SL tablet Place 1 tablet (0.4 mg total) under the tongue every 5 (five) minutes x 3 doses as needed for chest pain.   psyllium (METAMUCIL) 58.6 % packet Take 1 packet by mouth daily.   tadalafil (CIALIS) 10 MG tablet Take 10 mg by mouth daily.   [  DISCONTINUED] apixaban (ELIQUIS) 5 MG TABS tablet Take 1 tablet by mouth twice daily   [DISCONTINUED] empagliflozin (JARDIANCE) 10 MG TABS tablet Take 1 tablet (10 mg total) by mouth daily before breakfast.   [DISCONTINUED] spironolactone (ALDACTONE) 25 MG tablet Take 0.5 tablets (12.5 mg total) by mouth daily.   [DISCONTINUED] TERBINAFINE HCL PO Take 250 mg by mouth daily.     Allergies:   Ciprofloxacin, Crestor [rosuvastatin calcium], and Nexletol [bempedoic acid]   Social History   Socioeconomic History   Marital status: Married    Spouse name: Not on file   Number of children: Not on file   Years of education: Not on file   Highest education level: Not on file  Occupational History   Occupation: Tour manager  Tobacco Use   Smoking status: Former    Types: Cigarettes   Smokeless tobacco: Never  Vaping Use   Vaping status: Never Used  Substance and Sexual Activity   Alcohol use: Yes    Comment: Wine  with dinner   Drug use: Never   Sexual activity: Not on file  Other Topics Concern   Not on file  Social History Narrative   Not on file   Social Determinants of Health   Financial Resource Strain: Not on file  Food Insecurity: No Food Insecurity (06/29/2020)   Hunger Vital Sign    Worried About Running Out of Food in the Last Year: Never true    Ran Out of Food in the Last Year: Never true  Transportation Needs: No Transportation Needs (06/29/2020)   PRAPARE - Administrator, Civil Service (Medical): No    Lack of Transportation (Non-Medical): No  Physical Activity: Not on file  Stress: Not on file  Social Connections: Not on file     Family History: The patient's family history includes CAD in his brother, father, and mother; Healthy in his brother; Hypertension in his mother; Leukemia in his father; Osteoporosis in his mother; Ovarian cancer in his sister; Rheumatic fever in his brother. ROS:   Please see the history of present illness.    All 14 point review of systems negative except as described per history of present illness  EKGs/Labs/Other Studies Reviewed:         Recent Labs: 03/24/2022: BUN 25; Creatinine, Ser 1.00; Potassium 4.3; Sodium 142  Recent Lipid Panel    Component Value Date/Time   CHOL 181 12/15/2020 0835   TRIG 45 12/15/2020 0835   HDL 87 12/15/2020 0835   CHOLHDL 2.1 12/15/2020 0835   CHOLHDL 2.8 05/29/2020 0100   VLDL 18 05/29/2020 0100   LDLCALC 85 12/15/2020 0835    Physical Exam:    VS:  BP 124/72 (BP Location: Left Arm, Patient Position: Sitting)   Pulse 77   Ht 5\' 11"  (1.803 m)   Wt 164 lb 12.8 oz (74.8 kg)   SpO2 98%   BMI 22.98 kg/m     Wt Readings from Last 3 Encounters:  09/01/22 164 lb 12.8 oz (74.8 kg)  10/28/21 160 lb (72.6 kg)  05/18/21 155 lb 3.2 oz (70.4 kg)     GEN:  Well nourished, well developed in no acute distress HEENT: Normal NECK: No JVD; No carotid bruits LYMPHATICS: No  lymphadenopathy CARDIAC: RRR, systolic murmur 2/6 lt sternal border, no rubs, no gallops RESPIRATORY:  Clear to auscultation without rales, wheezing or rhonchi  ABDOMEN: Soft, non-tender, non-distended MUSCULOSKELETAL:  No edema; No deformity  SKIN: Warm and dry LOWER EXTREMITIES: no  swelling NEUROLOGIC:  Alert and oriented x 3 PSYCHIATRIC:  Normal affect   ASSESSMENT:    1. Nonrheumatic mitral valve regurgitation   2. Coronary artery disease involving native coronary artery of native heart without angina pectoris   3. Mixed hyperlipidemia   4. Essential hypertension   5. Ischemic cardiomyopathy   6. Cardiac pacemaker   7. S/P mitral valve repair   8. S/P Maze operation for atrial fibrillation   9. Status post biventricular pacemaker    PLAN:    In order of problems listed above:  S/P MV repair - echo reviewed, doing well, compensated. CHF/ CM - echo showed normal EF, will d/c aldactone, chem7 next week  PVD - will repeat carotic [place]  PP< - normal function  PAF - off AC, no PAF, had clipped appendage   Medication Adjustments/Labs and Tests Ordered: Current medicines are reviewed at length with the patient today.  Concerns regarding medicines are outlined above.  Orders Placed This Encounter  Procedures   Lipid panel   Basic metabolic panel   EKG 12-Lead   VAS US CAROTID   Medication changes:  Meds ordered this encounter  Medications   aspirin EC 81 MG tablet    Sig: Take 1 tablet (81 mg total) by mouth daily. Swallow whole.    Signed, Georgeanna Lea, MD, [facility] 09/01/2022 8:55 AM    [facility]

## 2022-09-06 ENCOUNTER — Ambulatory Visit: Payer: Medicare Other | Admitting: Cardiology

## 2022-09-18 ENCOUNTER — Ambulatory Visit (INDEPENDENT_AMBULATORY_CARE_PROVIDER_SITE_OTHER): Payer: Medicare Other

## 2022-09-18 DIAGNOSIS — I442 Atrioventricular block, complete: Secondary | ICD-10-CM

## 2022-09-19 ENCOUNTER — Encounter: Payer: Self-pay | Admitting: Cardiology

## 2022-09-20 DIAGNOSIS — E782 Mixed hyperlipidemia: Secondary | ICD-10-CM | POA: Diagnosis not present

## 2022-09-20 DIAGNOSIS — I10 Essential (primary) hypertension: Secondary | ICD-10-CM | POA: Diagnosis not present

## 2022-09-25 LAB — CUP PACEART REMOTE DEVICE CHECK
Battery Remaining Longevity: 40 mo
Battery Remaining Percentage: 38 %
Battery Voltage: 2.96 V
Brady Statistic AP VP Percent: 1 %
Brady Statistic AP VS Percent: 1 %
Brady Statistic AS VP Percent: 99 %
Brady Statistic AS VS Percent: 1 %
Brady Statistic RA Percent Paced: 1 %
Date Time Interrogation Session: 20240810035928
Implantable Lead Connection Status: 753985
Implantable Lead Connection Status: 753985
Implantable Lead Connection Status: 753985
Implantable Lead Implant Date: 20130812
Implantable Lead Implant Date: 20190513
Implantable Lead Implant Date: 20190513
Implantable Lead Location: 753858
Implantable Lead Location: 753859
Implantable Lead Location: 753860
Implantable Pulse Generator Implant Date: 20190513
Lead Channel Impedance Value: 290 Ohm
Lead Channel Impedance Value: 380 Ohm
Lead Channel Impedance Value: 950 Ohm
Lead Channel Pacing Threshold Amplitude: 0.875 V
Lead Channel Pacing Threshold Amplitude: 1 V
Lead Channel Pacing Threshold Amplitude: 1 V
Lead Channel Pacing Threshold Pulse Width: 0.2 ms
Lead Channel Pacing Threshold Pulse Width: 0.4 ms
Lead Channel Pacing Threshold Pulse Width: 0.6 ms
Lead Channel Sensing Intrinsic Amplitude: 5 mV
Lead Channel Sensing Intrinsic Amplitude: 8.5 mV
Lead Channel Setting Pacing Amplitude: 1.875
Lead Channel Setting Pacing Amplitude: 2 V
Lead Channel Setting Pacing Amplitude: 2 V
Lead Channel Setting Pacing Pulse Width: 0.4 ms
Lead Channel Setting Pacing Pulse Width: 0.6 ms
Lead Channel Setting Sensing Sensitivity: 4 mV
Pulse Gen Model: 3562
Pulse Gen Serial Number: 9431568

## 2022-09-27 ENCOUNTER — Ambulatory Visit: Payer: Medicare Other | Attending: Cardiology

## 2022-09-27 DIAGNOSIS — I2510 Atherosclerotic heart disease of native coronary artery without angina pectoris: Secondary | ICD-10-CM | POA: Diagnosis not present

## 2022-09-27 DIAGNOSIS — I6523 Occlusion and stenosis of bilateral carotid arteries: Secondary | ICD-10-CM | POA: Insufficient documentation

## 2022-10-03 NOTE — Progress Notes (Signed)
Remote pacemaker transmission.   

## 2022-10-06 ENCOUNTER — Telehealth: Payer: Self-pay

## 2022-10-06 DIAGNOSIS — E782 Mixed hyperlipidemia: Secondary | ICD-10-CM

## 2022-10-06 MED ORDER — EZETIMIBE 10 MG PO TABS: 10.0000 mg | ORAL_TABLET | Freq: Every day | ORAL | 3 refills | Status: DC

## 2022-10-06 NOTE — Telephone Encounter (Signed)
-----   Message from Gypsy Balsam sent at 09/26/2022 12:39 PM EDT ----- Labs are looking fine I would like to have LDL less than 70 in this 81 and 73 except I will suggest to add Zetia 10 mg daily to medical regiment and fasting lipid profile, AST ALT 6 weeks

## 2022-10-27 ENCOUNTER — Telehealth: Payer: Self-pay | Admitting: *Deleted

## 2022-10-27 NOTE — Telephone Encounter (Signed)
-----   Message from [facility] sent at 10/13/2022 12:54 PM EDT ----- Rip Harbour to increase ----- Message ----- From: Baird Lyons, RN Sent: 10/12/2022  10:01 AM EDT To: Regan Lemming, MD; Baird Lyons, RN  Pt take 12.5 mg (1/2 tab) of Toprol -- do you want to increase to 25 mg instead of 50?

## 2022-10-27 NOTE — Telephone Encounter (Addendum)
Pt aware of findings/recommendation. Discussed w/ device clinic who confirms this is not the first episode for pt. Pt reports he cannot take higher dose of Metoprolol d/t SE on higher doses (falling on 25 mg).   Will forward to Dr. Elberta Fortis for advisement.  Pt reports he is feeling great/doing well.  Still very active, hikes 8-10 miles.  (Pt would like me to send Dr. Gershon Crane new recommendation to Dr. Bing Matter as well for approval) Aware it will next next week/two before hearing back from me on this matter. Pt agreeable to plan.

## 2022-10-27 NOTE — Telephone Encounter (Signed)
Left message to call back  

## 2022-10-28 ENCOUNTER — Other Ambulatory Visit: Payer: Self-pay | Admitting: Cardiology

## 2022-11-01 NOTE — Telephone Encounter (Signed)
Aware ok to keep toprol at 12.5 mg daily per MD. Will continue remote monitoring. Pt agreeable to plan.

## 2022-11-10 DIAGNOSIS — Z23 Encounter for immunization: Secondary | ICD-10-CM | POA: Diagnosis not present

## 2022-12-01 DIAGNOSIS — Z6822 Body mass index (BMI) 22.0-22.9, adult: Secondary | ICD-10-CM | POA: Diagnosis not present

## 2022-12-01 DIAGNOSIS — J40 Bronchitis, not specified as acute or chronic: Secondary | ICD-10-CM | POA: Diagnosis not present

## 2022-12-01 DIAGNOSIS — J329 Chronic sinusitis, unspecified: Secondary | ICD-10-CM | POA: Diagnosis not present

## 2022-12-18 ENCOUNTER — Ambulatory Visit (INDEPENDENT_AMBULATORY_CARE_PROVIDER_SITE_OTHER): Payer: Medicare Other

## 2022-12-18 DIAGNOSIS — I442 Atrioventricular block, complete: Secondary | ICD-10-CM

## 2022-12-20 DIAGNOSIS — Z23 Encounter for immunization: Secondary | ICD-10-CM | POA: Diagnosis not present

## 2022-12-21 ENCOUNTER — Other Ambulatory Visit: Payer: Self-pay | Admitting: Cardiology

## 2022-12-21 LAB — CUP PACEART REMOTE DEVICE CHECK
Battery Remaining Longevity: 37 mo
Battery Remaining Percentage: 36 %
Battery Voltage: 2.95 V
Brady Statistic AP VP Percent: 1 %
Brady Statistic AP VS Percent: 1 %
Brady Statistic AS VP Percent: 99 %
Brady Statistic AS VS Percent: 1 %
Brady Statistic RA Percent Paced: 1 %
Date Time Interrogation Session: 20241105145232
Implantable Lead Connection Status: 753985
Implantable Lead Connection Status: 753985
Implantable Lead Connection Status: 753985
Implantable Lead Implant Date: 20130812
Implantable Lead Implant Date: 20190513
Implantable Lead Implant Date: 20190513
Implantable Lead Location: 753858
Implantable Lead Location: 753859
Implantable Lead Location: 753860
Implantable Pulse Generator Implant Date: 20190513
Lead Channel Impedance Value: 290 Ohm
Lead Channel Impedance Value: 380 Ohm
Lead Channel Impedance Value: 890 Ohm
Lead Channel Pacing Threshold Amplitude: 0.75 V
Lead Channel Pacing Threshold Amplitude: 0.875 V
Lead Channel Pacing Threshold Amplitude: 1 V
Lead Channel Pacing Threshold Pulse Width: 0.2 ms
Lead Channel Pacing Threshold Pulse Width: 0.4 ms
Lead Channel Pacing Threshold Pulse Width: 0.6 ms
Lead Channel Sensing Intrinsic Amplitude: 5 mV
Lead Channel Sensing Intrinsic Amplitude: 7.3 mV
Lead Channel Setting Pacing Amplitude: 1.75 V
Lead Channel Setting Pacing Amplitude: 2 V
Lead Channel Setting Pacing Amplitude: 2 V
Lead Channel Setting Pacing Pulse Width: 0.4 ms
Lead Channel Setting Pacing Pulse Width: 0.6 ms
Lead Channel Setting Sensing Sensitivity: 4 mV
Pulse Gen Model: 3562
Pulse Gen Serial Number: 9431568

## 2022-12-22 ENCOUNTER — Other Ambulatory Visit: Payer: Self-pay | Admitting: Medical Genetics

## 2022-12-22 DIAGNOSIS — Z006 Encounter for examination for normal comparison and control in clinical research program: Secondary | ICD-10-CM

## 2022-12-25 DIAGNOSIS — H353131 Nonexudative age-related macular degeneration, bilateral, early dry stage: Secondary | ICD-10-CM | POA: Diagnosis not present

## 2023-01-09 NOTE — Progress Notes (Signed)
Remote pacemaker transmission.   

## 2023-01-22 DIAGNOSIS — Z79899 Other long term (current) drug therapy: Secondary | ICD-10-CM | POA: Diagnosis not present

## 2023-01-22 DIAGNOSIS — Z0000 Encounter for general adult medical examination without abnormal findings: Secondary | ICD-10-CM | POA: Diagnosis not present

## 2023-01-22 DIAGNOSIS — I5020 Unspecified systolic (congestive) heart failure: Secondary | ICD-10-CM | POA: Diagnosis not present

## 2023-01-22 DIAGNOSIS — E7800 Pure hypercholesterolemia, unspecified: Secondary | ICD-10-CM | POA: Diagnosis not present

## 2023-01-22 DIAGNOSIS — I779 Disorder of arteries and arterioles, unspecified: Secondary | ICD-10-CM | POA: Diagnosis not present

## 2023-01-22 DIAGNOSIS — D519 Vitamin B12 deficiency anemia, unspecified: Secondary | ICD-10-CM | POA: Diagnosis not present

## 2023-03-08 ENCOUNTER — Ambulatory Visit: Payer: Medicare Other | Attending: Cardiology | Admitting: Cardiology

## 2023-03-08 ENCOUNTER — Encounter: Payer: Self-pay | Admitting: Cardiology

## 2023-03-08 VITALS — BP 170/80 | HR 96 | Ht 71.0 in | Wt 169.6 lb

## 2023-03-08 DIAGNOSIS — Z950 Presence of cardiac pacemaker: Secondary | ICD-10-CM | POA: Insufficient documentation

## 2023-03-08 DIAGNOSIS — I2510 Atherosclerotic heart disease of native coronary artery without angina pectoris: Secondary | ICD-10-CM | POA: Insufficient documentation

## 2023-03-08 DIAGNOSIS — I779 Disorder of arteries and arterioles, unspecified: Secondary | ICD-10-CM | POA: Insufficient documentation

## 2023-03-08 DIAGNOSIS — Z951 Presence of aortocoronary bypass graft: Secondary | ICD-10-CM | POA: Insufficient documentation

## 2023-03-08 DIAGNOSIS — I255 Ischemic cardiomyopathy: Secondary | ICD-10-CM | POA: Diagnosis not present

## 2023-03-08 DIAGNOSIS — Z8679 Personal history of other diseases of the circulatory system: Secondary | ICD-10-CM | POA: Diagnosis not present

## 2023-03-08 DIAGNOSIS — Z9889 Other specified postprocedural states: Secondary | ICD-10-CM | POA: Insufficient documentation

## 2023-03-08 DIAGNOSIS — E782 Mixed hyperlipidemia: Secondary | ICD-10-CM | POA: Diagnosis not present

## 2023-03-08 DIAGNOSIS — I739 Peripheral vascular disease, unspecified: Secondary | ICD-10-CM | POA: Insufficient documentation

## 2023-03-08 MED ORDER — EZETIMIBE 10 MG PO TABS: 10.0000 mg | ORAL_TABLET | Freq: Every day | ORAL | 3 refills | Status: DC

## 2023-03-08 NOTE — Patient Instructions (Addendum)
Medication Instructions:   START: Zetia 10mg  1 tablet daily   Lab Work: Your physician recommends that you return for lab work in: 6 weeks You need to have labs done when you are fasting.  You can come Monday through Friday 8:30 am to 12:00 pm and 1:15 to 4:30. You do not need to make an appointment as the order has already been placed. The labs you are going to have done are AST, ALT Lipids.    Testing/Procedures: Your physician has requested that you have a carotid duplex. This test is an ultrasound of the carotid arteries in your neck. It looks at blood flow through these arteries that supply the brain with blood. Allow one hour for this exam. There are no restrictions or special instructions.    Follow-Up: At [facility], you and your health needs are our priority.  As part of our continuing mission to provide you with exceptional heart care, we have created designated Provider Care Teams.  These Care Teams include your primary Cardiologist (physician) and Advanced Practice Providers (APPs -  Physician Assistants and Nurse Practitioners) who all work together to provide you with the care you need, when you need it.  We recommend signing up for the patient portal called "MyChart".  Sign up information is provided on this After Visit Summary.  MyChart is used to connect with patients for Virtual Visits (Telemedicine).  Patients are able to view lab/test results, encounter notes, upcoming appointments, etc.  Non-urgent messages can be sent to your provider as well.   To learn more about what you can do with MyChart, go to [email or web address].    Your next appointment:   6 month(s)  The format for your next appointment:   In Person  Provider:   Gypsy Balsam, MD    Other Instructions NA

## 2023-03-08 NOTE — Progress Notes (Signed)
Cardiology Office Note:    Date:  03/08/2023   ID:  Ian Mcgee, DOB Apr 18, 1945, MRN [id number]  PCP:  Ian Auer, MD  Cardiologist:  Ian Balsam, MD    Referring MD: Ian Auer, MD   Chief Complaint  Patient presents with   Follow-up    History of Present Illness:    Ian Mcgee is a 78 y.o. male with complex past medical history.  He does have history of coronary artery disease in June 02, 2020 he underwent triple vessel coronary bypass graft with LIMA to LAD, SVG to posterior descending branch SVG to diagonal branch at the same time he got mitral valve ring placed with 28 mm Edwards Crinx for mitral regurgitation, his procedure has been already done as well as left atrial appendage clipping.  Additional problem include complete heart block with dual-chamber pacemaker upgraded to BiV, peripheral vascular disease with up to 59% stenosis both carotid arteries. Comes today to months for follow-up still very active participating in different sport activities.  Main concern is about his wife Ian Mcgee who does have quite extensive back surgery including surgery done just recently which make her an quite disabled.  He hopes things will get better.  Denies have any chest pain tightness squeezing pressure burning chest but still have those episodes of brief moments of dizziness.  We did investigate his before so far nothing revealing  Past Medical History:  Diagnosis Date   Acute on chronic systolic heart failure (HCC)    Attention deficit disorder (ADD)    Cardiac pacemaker 05/10/2017   Cardiomyopathy (HCC)    Chest pain 06/05/2020   Chronic apical periodontitis    Complete heart block (HCC) 06/10/2015   Coronary artery disease    40% left main, 50% LAD, 50% RCA recent cardiac catheterization from March 2019   Dyslipidemia    Encounter for preoperative dental examination    Essential hypertension    Gingival recession, generalized    Gingivitis    Hyperlipidemia     Long term (current) use of anticoagulants 06/22/2020   Mitral regurgitation    NSTEMI (non-ST elevated myocardial infarction) (HCC) 06/06/2020   Pacemaker reprogramming/check 06/10/2015   Peripheral vascular disease (HCC)    Noncritical bilateral carotid arterial disease   Pneumonia    S/P CABG x 3 06/10/2020   LIMA to LAD, SVG to Diag, SVG to PDA   S/P Maze operation for atrial fibrillation 06/10/2020   Left atrial lesion set using bipolar radiofrequency and cryothermy ablation with clipping of LA appendage   S/P mitral valve repair 06/10/2020   Annuloplasty with a M28 mm Model 4100  Martyn Malay [place]  SN: [id number]     Status post biventricular pacemaker 07/16/2017   Syncope and collapse    Teeth missing     Past Surgical History:  Procedure Laterality Date   Basel Cell Cancer     BIV UPGRADE N/A 06/25/2017   Procedure: BIV UPGRADE;  Surgeon: Regan Lemming, MD;  [facility] INVAS[facility] CV LAB;  Service: Cardiovascular;  Laterality: N/A;   BUBBLE STUDY  06/03/2020   Procedure: BUBBLE STUDY;  Surgeon: Parke Poisson, MD;  Location: [facility] ENDOSCOPY;  Service: Cardiology;;   CATARACT EXTRACTION     CLIPPING OF ATRIAL APPENDAGE  06/10/2020   Procedure: CLIPPING OF ATRIAL APPENDAGE USING  ATRICURE PRO2 CLIP SIZE ;  Surgeon: Purcell Nails, MD;  Location[facility];  Service: Open Heart Surgery;;   CORONARY ARTERY BYPASS GRAFT N/A  06/10/2020   Procedure: CORONARY ARTERY BYPASS GRAFTING (CABG) X THREE USING LEFT INTERNAL MAMMARY ARTERY AND BILATERAL LEGS GREATER SAPHENOUS VEIN HARVESTED ENDOSCOPICALLY;  Surgeon: Purcell Nails, MD;  Location: [facility] OR;  Service: Open Heart Surgery;  Laterality: N/A;   CORONARY PRESSURE/FFR STUDY N/A 05/28/2020   Procedure: INTRAVASCULAR PRESSURE WIRE/FFR STUDY;  Surgeon: Tonny Bollman, MD;  Location: [facility] INVASIVE CV LAB;  Service: Cardiovascular;  Laterality: N/A;   INSERT / REPLACE / REMOVE PACEMAKER     KNEE SURGERY Right    ACL Reconstruction    LEFT HEART CATH AND CORONARY ANGIOGRAPHY N/A 05/10/2017   Procedure: LEFT HEART CATH AND CORONARY ANGIOGRAPHY;  Surgeon: Marykay Lex, MD;  Location: [facility] INVASIVE CV LAB;  Service: Cardiovascular;  Laterality: N/A;   MAZE N/A 06/10/2020   Procedure: MAZE;  Surgeon: Purcell Nails, MD;  Location: [facility] OR;  Service: Open Heart Surgery;  Laterality: N/A;   MITRAL VALVE REPAIR N/A 06/10/2020   Procedure: MITRAL VALVE REPAIR USING CARPENTIER MCCARTHY-ADAMS RING SIZE ;  Surgeon: Purcell Nails, MD;  Location: [facility] OR;  Service: Open Heart Surgery;  Laterality: N/A;   RIGHT/LEFT HEART CATH AND CORONARY ANGIOGRAPHY N/A 05/28/2020   Procedure: RIGHT/LEFT HEART CATH AND CORONARY ANGIOGRAPHY;  Surgeon: Tonny Bollman, MD;  Location: [facility] INVASIVE CV LAB;  Service: Cardiovascular;  Laterality: N/A;   TEE WITHOUT CARDIOVERSION N/A 06/03/2020   Procedure: TRANSESOPHAGEAL ECHOCARDIOGRAM (TEE);  Surgeon: Parke Poisson, MD;  Location: [facility] ENDOSCOPY;  Service: Cardiology;  Laterality: N/A;   TEE WITHOUT CARDIOVERSION N/A 06/10/2020   Procedure: TRANSESOPHAGEAL ECHOCARDIOGRAM (TEE);  Surgeon: Purcell Nails, MD;  Location: [facility] OR;  Service: Open Heart Surgery;  Laterality: N/A;   VASECTOMY      Current Medications: Current Meds  Medication Sig   amoxicillin (AMOXIL) 500 MG capsule Take 2,000 mg by mouth as needed (prior to dental procedures).   amphetamine-dextroamphetamine (ADDERALL) 10 MG tablet Take 10 mg by mouth as needed (ADHD).   aspirin EC 81 MG tablet Take 1 tablet (81 mg total) by mouth daily. Swallow whole.   atorvastatin (LIPITOR) 80 MG tablet TAKE 1 TABLET(80 MG) BY MOUTH DAILY (Patient taking differently: Take 80 mg by mouth daily.)   B-12, Methylcobalamin, 1000 MCG SUBL Place 1,000 mcg under the tongue every morning.   cyanocobalamin (VITAMIN B12) 1000 MCG tablet Take 1,000 mcg by mouth daily.   ezetimibe (ZETIA) 10 MG tablet Take 1 tablet (10 mg total) by mouth daily.   losartan  (COZAAR) 25 MG tablet TAKE 1 TABLET(25 MG) BY MOUTH TWICE DAILY (Patient taking differently: Take 25 mg by mouth daily.)   metoprolol succinate (TOPROL-XL) 25 MG 24 hr tablet Take 0.5 tablets (12.5 mg total) by mouth at bedtime.   Multiple Vitamins-Minerals (PRESERVISION AREDS PO) Take 1 tablet by mouth in the morning and at bedtime.   nitroGLYCERIN (NITROSTAT) 0.4 MG SL tablet Place 1 tablet (0.4 mg total) under the tongue every 5 (five) minutes x 3 doses as needed for chest pain.   psyllium (METAMUCIL) 58.6 % packet Take 1 packet by mouth daily.   tadalafil (CIALIS) 10 MG tablet Take 10 mg by mouth daily.   [DISCONTINUED] ezetimibe (ZETIA) 10 MG tablet Take 1 tablet (10 mg total) by mouth daily.   [DISCONTINUED] Niacinamide-Zn-Cu-Methfo-Se-Cr (NICOTINAMIDE PO) Take 300 mg by mouth daily.     Allergies:   Ciprofloxacin, Crestor [rosuvastatin calcium], and Nexletol [bempedoic acid]   Social History   Socioeconomic History   Marital status: Married  Spouse name: Not on file   Number of children: Not on file   Years of education: Not on file   Highest education level: Not on file  Occupational History   Occupation: [employer]  Tobacco Use   Smoking status: Former    Types: Cigarettes   Smokeless tobacco: Never  Vaping Use   Vaping status: Never Used  Substance and Sexual Activity   Alcohol use: Yes    Comment: Wine with dinner   Drug use: Never   Sexual activity: Not on file  Other Topics Concern   Not on file  Social History Narrative   Not on file   Social Drivers of Health   Financial Resource Strain: Not on file  Food Insecurity: No Food Insecurity (06/29/2020)   Hunger Vital Sign    Worried About Running Out of Food in the Last Year: Never true    Ran Out of Food in the Last Year: Never true  Transportation Needs: No Transportation Needs (06/29/2020)   PRAPARE - Administrator, Civil Service (Medical): No    Lack of Transportation  (Non-Medical): No  Physical Activity: Not on file  Stress: Not on file  Social Connections: Not on file     Family History: The patient's family history includes CAD in his brother, father, and mother; Healthy in his brother; Hypertension in his mother; Leukemia in his father; Osteoporosis in his mother; Ovarian cancer in his sister; Rheumatic fever in his brother. ROS:   Please see the history of present illness.    All 14 point review of systems negative except as described per history of present illness  EKGs/Labs/Other Studies Reviewed:         Recent Labs: 09/20/2022: BUN 19; Creatinine, Ser 0.99; Potassium 4.3; Sodium 139  Recent Lipid Panel    Component Value Date/Time   CHOL 174 09/20/2022 0805   TRIG 44 09/20/2022 0805   HDL 84 09/20/2022 0805   CHOLHDL 2.1 09/20/2022 0805   CHOLHDL 2.8 05/29/2020 0100   VLDL 18 05/29/2020 0100   LDLCALC 81 09/20/2022 0805    Physical Exam:    VS:  BP (!) 170/80 (BP Location: Right Arm, Patient Position: Sitting)   Pulse 96   Ht 5\' 11"  (1.803 m)   Wt 169 lb 9.6 oz (76.9 kg)   SpO2 99%   BMI 23.65 kg/m     Wt Readings from Last 3 Encounters:  03/08/23 169 lb 9.6 oz (76.9 kg)  09/01/22 164 lb 12.8 oz (74.8 kg)  10/28/21 160 lb (72.6 kg)     GEN:  Well nourished, well developed in no acute distress HEENT: Normal NECK: No JVD; No carotid bruits LYMPHATICS: No lymphadenopathy CARDIAC: RRR, holosystolic murmur grade 1/6 to 2/6 best heard left border of the sternum, no rubs, no gallops RESPIRATORY:  Clear to auscultation without rales, wheezing or rhonchi  ABDOMEN: Soft, non-tender, non-distended MUSCULOSKELETAL:  No edema; No deformity  SKIN: Warm and dry LOWER EXTREMITIES: no swelling NEUROLOGIC:  Alert and oriented x 3 PSYCHIATRIC:  Normal affect   ASSESSMENT:    1. Carotid artery disease, unspecified laterality, unspecified type (HCC)   2. Mixed hyperlipidemia   3. Ischemic cardiomyopathy   4. Coronary artery  disease involving native coronary artery of native heart without angina pectoris   5. Peripheral vascular disease (HCC)   6. S/P Maze operation for atrial fibrillation   7. S/P CABG x 3   8. S/P mitral valve repair   9.  Status post biventricular pacemaker    PLAN:    In order of problems listed above:  Coronary disease status post coronary bypass graft stable denies have any signs and symptoms that would indicate reactivation of the problem, he is taking antiplatelet therapy which I will continue. Mixed dyslipidemia I did review blood test done by primary care physician's LDL is 97 so is not adequately controlled.  Asking to start going back on Zetia 10 on top of Lipitor 80 that he is already taking. Peripheral vascular disease in form of carotic arterial stenosis.  Will schedule him to have carotic ultrasound. BiV pacing.  Normal function followed by our EP team.   Medication Adjustments/Labs and Tests Ordered: Current medicines are reviewed at length with the patient today.  Concerns regarding medicines are outlined above.  Orders Placed This Encounter  Procedures   ALT   AST   Lipid panel   VAS US CAROTID   Medication changes:  Meds ordered this encounter  Medications   ezetimibe (ZETIA) 10 MG tablet    Sig: Take 1 tablet (10 mg total) by mouth daily.    Dispense:  90 tablet    Refill:  3    Signed, Georgeanna Lea, MD, [facility] 03/08/2023 3:42 PM    [facility]

## 2023-03-14 ENCOUNTER — Encounter: Payer: Self-pay | Admitting: Cardiology

## 2023-03-19 ENCOUNTER — Ambulatory Visit (INDEPENDENT_AMBULATORY_CARE_PROVIDER_SITE_OTHER): Payer: Medicare Other

## 2023-03-19 DIAGNOSIS — I442 Atrioventricular block, complete: Secondary | ICD-10-CM | POA: Diagnosis not present

## 2023-03-19 LAB — CUP PACEART REMOTE DEVICE CHECK
Battery Remaining Longevity: 34 mo
Battery Remaining Percentage: 33 %
Battery Voltage: 2.95 V
Brady Statistic AP VP Percent: 1 %
Brady Statistic AP VS Percent: 1 %
Brady Statistic AS VP Percent: 99 %
Brady Statistic AS VS Percent: 1 %
Brady Statistic RA Percent Paced: 1 %
Date Time Interrogation Session: 20250203025847
Implantable Lead Connection Status: 753985
Implantable Lead Connection Status: 753985
Implantable Lead Connection Status: 753985
Implantable Lead Implant Date: 20130812
Implantable Lead Implant Date: 20190513
Implantable Lead Implant Date: 20190513
Implantable Lead Location: 753858
Implantable Lead Location: 753859
Implantable Lead Location: 753860
Implantable Pulse Generator Implant Date: 20190513
Lead Channel Impedance Value: 290 Ohm
Lead Channel Impedance Value: 360 Ohm
Lead Channel Impedance Value: 910 Ohm
Lead Channel Pacing Threshold Amplitude: 0.875 V
Lead Channel Pacing Threshold Amplitude: 1 V
Lead Channel Pacing Threshold Amplitude: 1.125 V
Lead Channel Pacing Threshold Pulse Width: 0.2 ms
Lead Channel Pacing Threshold Pulse Width: 0.4 ms
Lead Channel Pacing Threshold Pulse Width: 0.6 ms
Lead Channel Sensing Intrinsic Amplitude: 12 mV
Lead Channel Sensing Intrinsic Amplitude: 5 mV
Lead Channel Setting Pacing Amplitude: 1.875
Lead Channel Setting Pacing Amplitude: 2 V
Lead Channel Setting Pacing Amplitude: 2.125
Lead Channel Setting Pacing Pulse Width: 0.4 ms
Lead Channel Setting Pacing Pulse Width: 0.6 ms
Lead Channel Setting Sensing Sensitivity: 4 mV
Pulse Gen Model: 3562
Pulse Gen Serial Number: 9431568

## 2023-03-19 MED ORDER — NEXLETOL 180 MG PO TABS: 1.0000 | ORAL_TABLET | Freq: Every day | ORAL | Status: DC

## 2023-03-19 NOTE — Addendum Note (Signed)
Addended by: Eleonore Chiquito on: 03/19/2023 10:09 AM   Modules accepted: Orders

## 2023-03-28 ENCOUNTER — Ambulatory Visit: Payer: Medicare Other | Attending: Cardiology

## 2023-03-28 DIAGNOSIS — I6523 Occlusion and stenosis of bilateral carotid arteries: Secondary | ICD-10-CM

## 2023-03-28 DIAGNOSIS — R55 Syncope and collapse: Secondary | ICD-10-CM | POA: Diagnosis not present

## 2023-03-28 DIAGNOSIS — I779 Disorder of arteries and arterioles, unspecified: Secondary | ICD-10-CM | POA: Diagnosis not present

## 2023-04-04 ENCOUNTER — Telehealth: Payer: Self-pay

## 2023-04-04 NOTE — Telephone Encounter (Signed)
 Left message on My Chart with [place] results per Dr. Vanetta Shawl note. Routed to PCP.

## 2023-04-05 ENCOUNTER — Other Ambulatory Visit: Payer: Self-pay

## 2023-04-05 MED ORDER — NEXLETOL 180 MG PO TABS: 1.0000 | ORAL_TABLET | Freq: Every day | ORAL | 6 refills | Status: DC

## 2023-04-13 ENCOUNTER — Other Ambulatory Visit (HOSPITAL_COMMUNITY): Payer: Self-pay

## 2023-04-13 ENCOUNTER — Telehealth: Payer: Self-pay | Admitting: Pharmacy Technician

## 2023-04-13 NOTE — Telephone Encounter (Signed)
 Pharmacy Patient Advocate Encounter   Received notification from Fax that prior authorization for nexletol is required/requested.   Insurance verification completed.   The patient is insured through [facility] .   Per test claim: PA required; PA submitted to above mentioned insurance via CoverMyMeds Key/confirmation #/EOC [id number] Status is pending

## 2023-04-16 ENCOUNTER — Other Ambulatory Visit (HOSPITAL_COMMUNITY): Payer: Self-pay

## 2023-04-16 NOTE — Telephone Encounter (Signed)
 Pharmacy Patient Advocate Encounter  Received notification from [facility] that Prior Authorization for Nexletol 180MG  tablets has been APPROVED from 03/30/23 to 02/12/2098. Ran test claim, Copay is $375.55. This test claim was processed through [facility]- copay amounts may vary at other pharmacies due to pharmacy/plan contracts, or as the patient moves through the different stages of their insurance plan.   PA #/Case ID/Reference #: [id number]   He has a high deductible

## 2023-04-25 NOTE — Progress Notes (Signed)
 Remote pacemaker transmission.

## 2023-04-27 DIAGNOSIS — Z6822 Body mass index (BMI) 22.0-22.9, adult: Secondary | ICD-10-CM | POA: Diagnosis not present

## 2023-04-27 DIAGNOSIS — J0120 Acute ethmoidal sinusitis, unspecified: Secondary | ICD-10-CM | POA: Diagnosis not present

## 2023-05-25 DIAGNOSIS — R509 Fever, unspecified: Secondary | ICD-10-CM | POA: Diagnosis not present

## 2023-05-25 DIAGNOSIS — L570 Actinic keratosis: Secondary | ICD-10-CM | POA: Diagnosis not present

## 2023-05-25 DIAGNOSIS — J209 Acute bronchitis, unspecified: Secondary | ICD-10-CM | POA: Diagnosis not present

## 2023-05-30 ENCOUNTER — Other Ambulatory Visit: Payer: Self-pay | Admitting: Cardiology

## 2023-06-18 ENCOUNTER — Ambulatory Visit (INDEPENDENT_AMBULATORY_CARE_PROVIDER_SITE_OTHER): Payer: Medicare Other

## 2023-06-18 DIAGNOSIS — I442 Atrioventricular block, complete: Secondary | ICD-10-CM

## 2023-06-19 LAB — CUP PACEART REMOTE DEVICE CHECK
Battery Remaining Longevity: 31 mo
Battery Remaining Percentage: 30 %
Battery Voltage: 2.95 V
Brady Statistic AP VP Percent: 1 %
Brady Statistic AP VS Percent: 1 %
Brady Statistic AS VP Percent: 99 %
Brady Statistic AS VS Percent: 1 %
Brady Statistic RA Percent Paced: 1 %
Date Time Interrogation Session: 20250505020012
Implantable Lead Connection Status: 753985
Implantable Lead Connection Status: 753985
Implantable Lead Connection Status: 753985
Implantable Lead Implant Date: 20130812
Implantable Lead Implant Date: 20190513
Implantable Lead Implant Date: 20190513
Implantable Lead Location: 753858
Implantable Lead Location: 753859
Implantable Lead Location: 753860
Implantable Pulse Generator Implant Date: 20190513
Lead Channel Impedance Value: 290 Ohm
Lead Channel Impedance Value: 360 Ohm
Lead Channel Impedance Value: 860 Ohm
Lead Channel Pacing Threshold Amplitude: 0.625 V
Lead Channel Pacing Threshold Amplitude: 1 V
Lead Channel Pacing Threshold Amplitude: 1.125 V
Lead Channel Pacing Threshold Pulse Width: 0.2 ms
Lead Channel Pacing Threshold Pulse Width: 0.4 ms
Lead Channel Pacing Threshold Pulse Width: 0.6 ms
Lead Channel Sensing Intrinsic Amplitude: 12 mV
Lead Channel Sensing Intrinsic Amplitude: 5 mV
Lead Channel Setting Pacing Amplitude: 1.625
Lead Channel Setting Pacing Amplitude: 2 V
Lead Channel Setting Pacing Amplitude: 2.125
Lead Channel Setting Pacing Pulse Width: 0.4 ms
Lead Channel Setting Pacing Pulse Width: 0.6 ms
Lead Channel Setting Sensing Sensitivity: 4 mV
Pulse Gen Model: 3562
Pulse Gen Serial Number: 9431568

## 2023-07-20 ENCOUNTER — Other Ambulatory Visit: Payer: Self-pay | Admitting: Cardiology

## 2023-08-01 ENCOUNTER — Encounter: Payer: Self-pay | Admitting: Cardiology

## 2023-08-03 ENCOUNTER — Other Ambulatory Visit

## 2023-08-06 ENCOUNTER — Other Ambulatory Visit: Payer: Self-pay

## 2023-08-06 NOTE — Progress Notes (Signed)
 Remote pacemaker transmission.

## 2023-08-06 NOTE — Addendum Note (Signed)
 Addended by: TAWNI DRILLING D on: 08/06/2023 04:29 PM   Modules accepted: Orders

## 2023-08-15 ENCOUNTER — Other Ambulatory Visit: Payer: Self-pay

## 2023-08-15 MED ORDER — NEXLETOL 180 MG PO TABS
1.0000 | ORAL_TABLET | Freq: Every day | ORAL | 3 refills | Status: DC
Start: 2023-08-15 — End: 2023-12-17

## 2023-08-28 ENCOUNTER — Other Ambulatory Visit (HOSPITAL_BASED_OUTPATIENT_CLINIC_OR_DEPARTMENT_OTHER): Payer: Self-pay

## 2023-08-28 ENCOUNTER — Other Ambulatory Visit: Payer: Self-pay | Admitting: Medical Genetics

## 2023-08-28 DIAGNOSIS — Z006 Encounter for examination for normal comparison and control in clinical research program: Secondary | ICD-10-CM

## 2023-08-31 ENCOUNTER — Other Ambulatory Visit: Payer: Self-pay | Admitting: Cardiology

## 2023-09-04 ENCOUNTER — Encounter: Payer: Self-pay | Admitting: Cardiology

## 2023-09-04 ENCOUNTER — Ambulatory Visit: Attending: Cardiology | Admitting: Cardiology

## 2023-09-04 VITALS — BP 110/62 | HR 85 | Ht 71.0 in | Wt 161.4 lb

## 2023-09-04 DIAGNOSIS — Z950 Presence of cardiac pacemaker: Secondary | ICD-10-CM | POA: Insufficient documentation

## 2023-09-04 DIAGNOSIS — Z9889 Other specified postprocedural states: Secondary | ICD-10-CM | POA: Diagnosis not present

## 2023-09-04 DIAGNOSIS — I10 Essential (primary) hypertension: Secondary | ICD-10-CM | POA: Diagnosis not present

## 2023-09-04 DIAGNOSIS — I2510 Atherosclerotic heart disease of native coronary artery without angina pectoris: Secondary | ICD-10-CM | POA: Diagnosis not present

## 2023-09-04 DIAGNOSIS — I779 Disorder of arteries and arterioles, unspecified: Secondary | ICD-10-CM | POA: Insufficient documentation

## 2023-09-04 DIAGNOSIS — R0609 Other forms of dyspnea: Secondary | ICD-10-CM | POA: Insufficient documentation

## 2023-09-04 DIAGNOSIS — I255 Ischemic cardiomyopathy: Secondary | ICD-10-CM | POA: Diagnosis not present

## 2023-09-04 DIAGNOSIS — Z8679 Personal history of other diseases of the circulatory system: Secondary | ICD-10-CM | POA: Insufficient documentation

## 2023-09-04 DIAGNOSIS — Z951 Presence of aortocoronary bypass graft: Secondary | ICD-10-CM | POA: Insufficient documentation

## 2023-09-04 NOTE — Progress Notes (Signed)
 Cardiology Office Note:    Date:  09/04/2023   ID:  Ian Mcgee, DOB [birth year], MRN [id number]  PCP:  Fernand Tracey LABOR, MD  Cardiologist:  Lamar Fitch, MD    Referring MD: Fernand Tracey LABOR, MD   Chief Complaint  Patient presents with   Follow-up    History of Present Illness:    Ian Mcgee is a 78 y.o. male  with complex past medical history. He does have history of coronary artery disease in June 02, 2020 he underwent triple vessel coronary bypass graft with LIMA to LAD, SVG to posterior descending branch SVG to diagonal branch at the same time he got mitral valve ring placed with 28 mm Edwards Crinx for mitral regurgitation, his procedure has been already done as well as left atrial appendage clipping. Additional problem include complete heart block with dual-chamber pacemaker upgraded to BiV, peripheral vascular disease with up to 59% stenosis both carotid arteries.  Comes today to months for follow-up overall he said he is doing fine but did notice some decreased ability to exercise.  He also complained of having some palpitations while he is walking fast and the concern is his smart watch telling him sometimes his heart rate is 180.  He is also planning to take a trip to [place] and he will to make sure it is fine for him to do that.  Denies have any chest pain tightness squeezing pressure burning chest  Past Medical History:  Diagnosis Date   Acute on chronic systolic heart failure (HCC)    Attention deficit disorder (ADD)    Cardiac pacemaker 05/10/2017   Cardiomyopathy (HCC)    Chest pain 06/05/2020   Chronic apical periodontitis    Complete heart block (HCC) 06/10/2015   Coronary artery disease    40% left main, 50% LAD, 50% RCA recent cardiac catheterization from March 2019   Dyslipidemia    Encounter for preoperative dental examination    Essential hypertension    Gingival recession, generalized    Gingivitis    Hyperlipidemia    Long  term (current) use of anticoagulants 06/22/2020   Mitral regurgitation    NSTEMI (non-ST elevated myocardial infarction) (HCC) 06/06/2020   Pacemaker reprogramming/check 06/10/2015   Peripheral vascular disease (HCC)    Noncritical bilateral carotid arterial disease   Pneumonia    S/P CABG x 3 06/10/2020   LIMA to LAD, SVG to Diag, SVG to PDA   S/P Maze operation for atrial fibrillation 06/10/2020   Left atrial lesion set using bipolar radiofrequency and cryothermy ablation with clipping of LA appendage   S/P mitral valve repair 06/10/2020   Annuloplasty with a M28 mm Model 4100  Celestia BODILY [place]  SN: [id number]     Status post biventricular pacemaker 07/16/2017   Syncope and collapse    Teeth missing     Past Surgical History:  Procedure Laterality Date   Basel Cell Cancer     BIV UPGRADE N/A 06/25/2017   Procedure: BIV UPGRADE;  Surgeon: Inocencio Soyla Lunger, MD;  Location: [facility] INVASIVE CV LAB;  Service: Cardiovascular;  Laterality: N/A;   BUBBLE STUDY  06/03/2020   Procedure: BUBBLE STUDY;  Surgeon: Loni Soyla LABOR, MD;  Location: [facility] ENDOSCOPY;  Service: Cardiology;;   CATARACT EXTRACTION     CLIPPING OF ATRIAL APPENDAGE  06/10/2020   Procedure: CLIPPING OF ATRIAL APPENDAGE USING  ATRICURE PRO2 CLIP SIZE ;  Surgeon: Dusty Sudie DEL, MD;  Location: [facility] OR;  Service: Open Heart Surgery;;   CORONARY ARTERY BYPASS GRAFT N/A 06/10/2020   Procedure: CORONARY ARTERY BYPASS GRAFTING (CABG) X THREE USING LEFT INTERNAL MAMMARY ARTERY AND BILATERAL LEGS GREATER SAPHENOUS VEIN HARVESTED ENDOSCOPICALLY;  Surgeon: Dusty Sudie DEL, MD;  Location: [facility] OR;  Service: Open Heart Surgery;  Laterality: N/A;   CORONARY PRESSURE/FFR STUDY N/A 05/28/2020   Procedure: INTRAVASCULAR PRESSURE WIRE/FFR STUDY;  Surgeon: Wonda Sharper, MD;  Location: [facility] INVASIVE CV LAB;  Service: Cardiovascular;  Laterality: N/A;   INSERT / REPLACE / REMOVE PACEMAKER     KNEE SURGERY Right    ACL Reconstruction    LEFT HEART CATH AND CORONARY ANGIOGRAPHY N/A 05/10/2017   Procedure: LEFT HEART CATH AND CORONARY ANGIOGRAPHY;  Surgeon: Anner Alm ORN, MD;  Location: [facility] INVASIVE CV LAB;  Service: Cardiovascular;  Laterality: N/A;   MAZE N/A 06/10/2020   Procedure: MAZE;  Surgeon: Dusty Sudie DEL, MD;  Location: [facility] OR;  Service: Open Heart Surgery;  Laterality: N/A;   MITRAL VALVE REPAIR N/A 06/10/2020   Procedure: MITRAL VALVE REPAIR USING CARPENTIER MCCARTHY-ADAMS RING SIZE ;  Surgeon: Dusty Sudie DEL, MD;  Location: [facility] OR;  Service: Open Heart Surgery;  Laterality: N/A;   RIGHT/LEFT HEART CATH AND CORONARY ANGIOGRAPHY N/A 05/28/2020   Procedure: RIGHT/LEFT HEART CATH AND CORONARY ANGIOGRAPHY;  Surgeon: Wonda Sharper, MD;  Location: [facility] INVASIVE CV LAB;  Service: Cardiovascular;  Laterality: N/A;   TEE WITHOUT CARDIOVERSION N/A 06/03/2020   Procedure: TRANSESOPHAGEAL ECHOCARDIOGRAM (TEE);  Surgeon: Loni Soyla LABOR, MD;  Location: [facility] ENDOSCOPY;  Service: Cardiology;  Laterality: N/A;   TEE WITHOUT CARDIOVERSION N/A 06/10/2020   Procedure: TRANSESOPHAGEAL ECHOCARDIOGRAM (TEE);  Surgeon: Dusty Sudie DEL, MD;  Location: [facility] OR;  Service: Open Heart Surgery;  Laterality: N/A;   VASECTOMY      Current Medications: Current Meds  Medication Sig   amoxicillin (AMOXIL) 500 MG capsule Take 2,000 mg by mouth as needed (prior to dental procedures).   amphetamine -dextroamphetamine (ADDERALL) 10 MG tablet Take 10 mg by mouth as needed (ADHD).   aspirin  EC 81 MG tablet Take 1 tablet (81 mg total) by mouth daily. Swallow whole.   atorvastatin  (LIPITOR) 80 MG tablet TAKE 1 TABLET(80 MG) BY MOUTH DAILY   Bempedoic Acid  (NEXLETOL ) 180 MG TABS Take 1 tablet (180 mg total) by mouth daily.   cyanocobalamin  (VITAMIN B12) 1000 MCG tablet Take 1,000 mcg by mouth daily.   losartan  (COZAAR ) 25 MG tablet TAKE 1 TABLET(25 MG) BY MOUTH TWICE DAILY   metoprolol  succinate (TOPROL -XL) 25 MG 24 hr tablet TAKE 1/2 TABLET(12.5 MG) BY  MOUTH AT BEDTIME   Multiple Vitamins-Minerals (PRESERVISION AREDS PO) Take 1 tablet by mouth in the morning and at bedtime.   OVER THE COUNTER MEDICATION Take 1 tablet by mouth in the morning and at bedtime. NMN supplements   psyllium (METAMUCIL) 58.6 % packet Take 1 packet by mouth daily.   tadalafil  (CIALIS ) 10 MG tablet Take 10 mg by mouth daily.   [DISCONTINUED] B-12, Methylcobalamin , 1000 MCG SUBL Place 1,000 mcg under the tongue every morning.   [DISCONTINUED] nitroGLYCERIN  (NITROSTAT ) 0.4 MG SL tablet Place 1 tablet (0.4 mg total) under the tongue every 5 (five) minutes x 3 doses as needed for chest pain.     Allergies:   Ciprofloxacin, Crestor  [rosuvastatin  calcium ], and Nexletol  [bempedoic acid ]   Social History   Socioeconomic History   Marital status: Married    Spouse name: Not on file   Number of children: Not on file  Years of education: Not on file   Highest education level: Not on file  Occupational History   Occupation: [employer]  Tobacco Use   Smoking status: Former    Types: Cigarettes   Smokeless tobacco: Never  Vaping Use   Vaping status: Never Used  Substance and Sexual Activity   Alcohol use: Yes    Comment: Wine with dinner   Drug use: Never   Sexual activity: Not on file  Other Topics Concern   Not on file  Social History Narrative   Not on file   Social Drivers of Health   Financial Resource Strain: Not on file  Food Insecurity: No Food Insecurity (06/29/2020)   Hunger Vital Sign    Worried About Running Out of Food in the Last Year: Never true    Ran Out of Food in the Last Year: Never true  Transportation Needs: No Transportation Needs (06/29/2020)   PRAPARE - Administrator, Civil Service (Medical): No    Lack of Transportation (Non-Medical): No  Physical Activity: Not on file  Stress: Not on file  Social Connections: Not on file     Family History: The patient's family history includes CAD in his brother,  father, and mother; Healthy in his brother; Hypertension in his mother; Leukemia in his father; Osteoporosis in his mother; Ovarian cancer in his sister; Rheumatic fever in his brother. ROS:   Please see the history of present illness.    All 14 point review of systems negative except as described per history of present illness  EKGs/Labs/Other Studies Reviewed:    EKG Interpretation Date/Time:  Tuesday September 04 2023 14:47:22 EDT Ventricular Rate:  85 PR Interval:  124 QRS Duration:  148 QT Interval:  430 QTC Calculation: 511 R Axis:   177  Text Interpretation: Atrial-sensed ventricular-paced rhythm with occasional Premature ventricular complexes Biventricular pacemaker detected Abnormal ECG When compared with ECG of 12-Aug-2020 12:06, Premature ventricular complexes are now Present Vent. rate has decreased BY  15 BPM Confirmed by Bernie Charleston [phone]) on 09/04/2023 2:55:24 PM    Recent Labs: 09/20/2022: BUN 19; Creatinine, Ser 0.99; Potassium 4.3; Sodium 139  Recent Lipid Panel    Component Value Date/Time   CHOL 174 09/20/2022 0805   TRIG 44 09/20/2022 0805   HDL 84 09/20/2022 0805   CHOLHDL 2.1 09/20/2022 0805   CHOLHDL 2.8 05/29/2020 0100   VLDL 18 05/29/2020 0100   LDLCALC 81 09/20/2022 0805    Physical Exam:    VS:  BP 110/62 (BP Location: Left Arm, Patient Position: Sitting)   Pulse 85   Ht 5' 11 (1.803 m)   Wt 161 lb 6.4 oz (73.2 kg)   SpO2 97%   BMI 22.51 kg/m     Wt Readings from Last 3 Encounters:  09/04/23 161 lb 6.4 oz (73.2 kg)  03/08/23 169 lb 9.6 oz (76.9 kg)  09/01/22 164 lb 12.8 oz (74.8 kg)     GEN:  Well nourished, well developed in no acute distress HEENT: Normal NECK: No JVD; No carotid bruits LYMPHATICS: No lymphadenopathy CARDIAC: RRR, no murmurs, no rubs, no gallops RESPIRATORY:  Clear to auscultation without rales, wheezing or rhonchi  ABDOMEN: Soft, non-tender, non-distended MUSCULOSKELETAL:  No edema; No deformity  SKIN: Warm and  dry LOWER EXTREMITIES: no swelling NEUROLOGIC:  Alert and oriented x 3 PSYCHIATRIC:  Normal affect   ASSESSMENT:    1. Essential hypertension   2. Carotid artery disease, unspecified laterality, unspecified type (HCC)  3. Dyspnea on exertion   4. Coronary artery disease involving native coronary artery of native heart without angina pectoris   5. Ischemic cardiomyopathy   6. S/P CABG x 3   7. S/P Maze operation for atrial fibrillation   8. S/P mitral valve repair   9. Cardiac pacemaker    PLAN:    In order of problems listed above:  Essential hypertension blood pressure well-controlled continue present management. Peripheral vascular disease will schedule to have carotic ultrasound to check degree of stenosis. History of cardiomyopathy echocardiogram will be done. Question about arrhythmia while exercising.  I will schedule him to have plain EKG stress test Cardiac pacemaker present.  Noted   Medication Adjustments/Labs and Tests Ordered: Current medicines are reviewed at length with the patient today.  Concerns regarding medicines are outlined above.  Orders Placed This Encounter  Procedures   MYOCARDIAL PERFUSION IMAGING   EKG 12-Lead   ECHOCARDIOGRAM COMPLETE   VAS US  CAROTID   Medication changes: No orders of the defined types were placed in this encounter.   Signed, Lamar DOROTHA Fitch, MD, [facility] 09/04/2023 4:49 PM    [facility]

## 2023-09-04 NOTE — Patient Instructions (Addendum)
 Medication Instructions:  Your physician recommends that you continue on your current medications as directed. Please refer to the Current Medication list given to you today.  *If you need a refill on your cardiac medications before your next appointment, please call your pharmacy*   Lab Work: None Ordered If you have labs (blood work) drawn today and your tests are completely normal, you will receive your results only by: MyChart Message (if you have MyChart) OR A paper copy in the mail If you have any lab test that is abnormal or we need to change your treatment, we will call you to review the results.   Testing/Procedures: Your physician has requested that you have an echocardiogram. Echocardiography is a painless test that uses sound waves to create images of your heart. It provides your doctor with information about the size and shape of your heart and how well your heart's chambers and valves are working. This procedure takes approximately one hour. There are no restrictions for this procedure. Please do NOT wear cologne, perfume, aftershave, or lotions (deodorant is allowed). Please arrive 15 minutes prior to your appointment time.  Please note: We ask at that you not bring children with you during ultrasound (echo/ vascular) testing. Due to room size and safety concerns, children are not allowed in the ultrasound rooms during exams. Our front office staff cannot provide observation of children in our lobby area while testing is being conducted. An adult accompanying a patient to their appointment will only be allowed in the ultrasound room at the discretion of the ultrasound technician under special circumstances. We apologize for any inconvenience.   Your physician has requested that you have a carotid duplex. This test is an ultrasound of the carotid arteries in your neck. It looks at blood flow through these arteries that supply the brain with blood. Allow one hour for this exam. There  are no restrictions or special instructions.  Your physician has requested that you have a lexiscan myoview. For further information please visit [email or web address] Please follow instruction sheet, as given.  The test will take approximately 3 to 4 hours to complete; you may bring reading material.  If someone comes with you to your appointment, they will need to remain in the main lobby due to limited space in the testing area.   How to prepare for your Myocardial Perfusion Test: Do not eat or drink 3 hours prior to your test, except you may have water. Do not consume products containing caffeine (regular or decaffeinated) 12 hours prior to your test. (ex: coffee, chocolate, sodas, tea). Do bring a list of your current medications with you.  If not listed below, you may take your medications as normal. Do wear comfortable clothes (no dresses or overalls) and walking shoes, tennis shoes preferred (No heels or open toe shoes are allowed). Do NOT wear cologne, perfume, aftershave, or lotions (deodorant is allowed). If these instructions are not followed, your test will have to be rescheduled.      Follow-Up: At [facility], you and your health needs are our priority.  As part of our continuing mission to provide you with exceptional heart care, we have created designated Provider Care Teams.  These Care Teams include your primary Cardiologist (physician) and Advanced Practice Providers (APPs -  Physician Assistants and Nurse Practitioners) who all work together to provide you with the care you need, when you need it.  We recommend signing up for the patient portal called MyChart.  Sign up information is  provided on this After Visit Summary.  MyChart is used to connect with patients for Virtual Visits (Telemedicine).  Patients are able to view lab/test results, encounter notes, upcoming appointments, etc.  Non-urgent messages can be sent to your provider as well.   To learn more about what  you can do with MyChart, go to [email or web address].    Your next appointment:   6 month(s)  The format for your next appointment:   In Person  Provider:   Lamar Fitch, MD    Other Instructions NA

## 2023-09-10 LAB — GENECONNECT MOLECULAR SCREEN: Genetic Analysis Overall Interpretation: NEGATIVE

## 2023-09-17 ENCOUNTER — Ambulatory Visit: Payer: Medicare Other

## 2023-09-17 DIAGNOSIS — I442 Atrioventricular block, complete: Secondary | ICD-10-CM | POA: Diagnosis not present

## 2023-09-17 LAB — CUP PACEART REMOTE DEVICE CHECK
Battery Remaining Longevity: 28 mo
Battery Remaining Percentage: 27 %
Battery Voltage: 2.93 V
Brady Statistic AP VP Percent: 1 %
Brady Statistic AP VS Percent: 1 %
Brady Statistic AS VP Percent: 99 %
Brady Statistic AS VS Percent: 1 %
Brady Statistic RA Percent Paced: 1 %
Date Time Interrogation Session: 20250804020033
Implantable Lead Connection Status: 753985
Implantable Lead Connection Status: 753985
Implantable Lead Connection Status: 753985
Implantable Lead Implant Date: 20130812
Implantable Lead Implant Date: 20190513
Implantable Lead Implant Date: 20190513
Implantable Lead Location: 753858
Implantable Lead Location: 753859
Implantable Lead Location: 753860
Implantable Pulse Generator Implant Date: 20190513
Lead Channel Impedance Value: 290 Ohm
Lead Channel Impedance Value: 380 Ohm
Lead Channel Impedance Value: 940 Ohm
Lead Channel Pacing Threshold Amplitude: 0.75 V
Lead Channel Pacing Threshold Amplitude: 1 V
Lead Channel Pacing Threshold Amplitude: 1.125 V
Lead Channel Pacing Threshold Pulse Width: 0.2 ms
Lead Channel Pacing Threshold Pulse Width: 0.4 ms
Lead Channel Pacing Threshold Pulse Width: 0.6 ms
Lead Channel Sensing Intrinsic Amplitude: 5 mV
Lead Channel Sensing Intrinsic Amplitude: 7.3 mV
Lead Channel Setting Pacing Amplitude: 1.75 V
Lead Channel Setting Pacing Amplitude: 2 V
Lead Channel Setting Pacing Amplitude: 2.125
Lead Channel Setting Pacing Pulse Width: 0.4 ms
Lead Channel Setting Pacing Pulse Width: 0.6 ms
Lead Channel Setting Sensing Sensitivity: 4 mV
Pulse Gen Model: 3562
Pulse Gen Serial Number: 9431568

## 2023-09-18 ENCOUNTER — Ambulatory Visit: Payer: Self-pay | Admitting: Cardiology

## 2023-09-20 ENCOUNTER — Telehealth (HOSPITAL_COMMUNITY): Payer: Self-pay | Admitting: *Deleted

## 2023-09-20 NOTE — Telephone Encounter (Signed)
 Spoke to patient as a reminder about his STRESS TEST on 09/26/23 at 8:00. He was also reminded not to take his Metoprolol  for 24 hours before the test.

## 2023-09-24 ENCOUNTER — Ambulatory Visit: Attending: Cardiology

## 2023-09-24 ENCOUNTER — Ambulatory Visit (INDEPENDENT_AMBULATORY_CARE_PROVIDER_SITE_OTHER)

## 2023-09-24 DIAGNOSIS — I6523 Occlusion and stenosis of bilateral carotid arteries: Secondary | ICD-10-CM | POA: Insufficient documentation

## 2023-09-24 DIAGNOSIS — I779 Disorder of arteries and arterioles, unspecified: Secondary | ICD-10-CM | POA: Diagnosis not present

## 2023-09-24 DIAGNOSIS — R0609 Other forms of dyspnea: Secondary | ICD-10-CM | POA: Insufficient documentation

## 2023-09-24 LAB — ECHOCARDIOGRAM COMPLETE
AR max vel: 1.17 cm2
AV Area VTI: 1.25 cm2
AV Area mean vel: 1.2 cm2
AV Mean grad: 10.5 mmHg
AV Peak grad: 18.9 mmHg
Ao pk vel: 2.17 m/s
Area-P 1/2: 2.56 cm2
MV VTI: 0.95 cm2
S' Lateral: 3 cm

## 2023-09-25 ENCOUNTER — Ambulatory Visit: Payer: Self-pay | Admitting: Cardiology

## 2023-09-26 ENCOUNTER — Ambulatory Visit: Attending: Cardiology

## 2023-09-26 DIAGNOSIS — R0609 Other forms of dyspnea: Secondary | ICD-10-CM | POA: Diagnosis not present

## 2023-09-26 MED ORDER — TECHNETIUM TC 99M TETROFOSMIN IV KIT
9.9000 | PACK | Freq: Once | INTRAVENOUS | Status: AC | PRN
  Administered 2023-09-26 (×2): 9.9 via INTRAVENOUS

## 2023-09-26 MED ORDER — TECHNETIUM TC 99M TETROFOSMIN IV KIT
30.5000 | PACK | Freq: Once | INTRAVENOUS | Status: AC | PRN
  Administered 2023-09-26 (×2): 30.5 via INTRAVENOUS

## 2023-09-26 MED ORDER — REGADENOSON 0.4 MG/5ML IV SOLN
0.4000 mg | Freq: Once | INTRAVENOUS | Status: AC
  Administered 2023-09-26 (×2): 0.4 mg via INTRAVENOUS

## 2023-09-27 LAB — MYOCARDIAL PERFUSION IMAGING
LV dias vol: 149 mL (ref 62–150)
LV sys vol: 76 mL (ref 4.2–5.8)
Nuc Stress EF: 49 %
Peak HR: 100 {beats}/min
Rest HR: 68 {beats}/min
Rest Nuclear Isotope Dose: 9.9 mCi
SDS: 3
SRS: 1
SSS: 4
Stress Nuclear Isotope Dose: 31.4 mCi
TID: 1.02

## 2023-09-28 ENCOUNTER — Encounter: Payer: Self-pay | Admitting: Cardiology

## 2023-10-05 ENCOUNTER — Telehealth: Payer: Self-pay

## 2023-10-05 NOTE — Telephone Encounter (Signed)
 Left message on My Chart with stress Test results per Dr. Karry note. Routed to PCP.

## 2023-10-05 NOTE — Telephone Encounter (Signed)
 Left message on My Chart with Carotid US  results per Dr. Karry note. Routed to PCP.

## 2023-10-05 NOTE — Telephone Encounter (Signed)
 Left message on My Chart with Echo results per Dr. Karry note. Routed to PCP.

## 2023-10-25 ENCOUNTER — Telehealth: Payer: Self-pay

## 2023-10-25 NOTE — Telephone Encounter (Signed)
 Pt viewed Echo results on My Chart per Dr. Vanetta Shawl note. Routed to PCP.

## 2023-10-25 NOTE — Telephone Encounter (Signed)
 Pt viewed Stress test results on My Chart per Dr. Vanetta Shawl note. Routed to PCP.

## 2023-10-25 NOTE — Telephone Encounter (Signed)
 Pt viewed Carotid Artery US  results on My Chart per Dr. Karry note. Routed to PCP.

## 2023-10-27 ENCOUNTER — Encounter: Payer: Self-pay | Admitting: Cardiology

## 2023-10-29 ENCOUNTER — Other Ambulatory Visit: Payer: Self-pay

## 2023-10-29 MED ORDER — LOSARTAN POTASSIUM 25 MG PO TABS
25.0000 mg | ORAL_TABLET | Freq: Two times a day (BID) | ORAL | 3 refills | Status: AC
  Filled 2024-01-30: qty 180, 90d supply, fill #0

## 2023-11-12 NOTE — Progress Notes (Signed)
 Remote PPM Transmission

## 2023-11-14 ENCOUNTER — Encounter: Payer: Self-pay | Admitting: Cardiology

## 2023-11-14 NOTE — Telephone Encounter (Signed)
 Normal device function. 4 AMS events, none noted day of symptoms and longest in duration 4 seconds.

## 2023-12-10 DIAGNOSIS — Z23 Encounter for immunization: Secondary | ICD-10-CM | POA: Diagnosis not present

## 2023-12-17 ENCOUNTER — Ambulatory Visit (INDEPENDENT_AMBULATORY_CARE_PROVIDER_SITE_OTHER): Payer: Medicare Other

## 2023-12-17 ENCOUNTER — Other Ambulatory Visit (HOSPITAL_BASED_OUTPATIENT_CLINIC_OR_DEPARTMENT_OTHER): Payer: Self-pay

## 2023-12-17 DIAGNOSIS — I442 Atrioventricular block, complete: Secondary | ICD-10-CM

## 2023-12-17 LAB — CUP PACEART REMOTE DEVICE CHECK
Battery Remaining Longevity: 25 mo
Battery Remaining Percentage: 24 %
Battery Voltage: 2.92 V
Brady Statistic AP VP Percent: 1 %
Brady Statistic AP VS Percent: 1 %
Brady Statistic AS VP Percent: 99 %
Brady Statistic AS VS Percent: 1 %
Brady Statistic RA Percent Paced: 1 %
Date Time Interrogation Session: 20251103010008
Implantable Lead Connection Status: 753985
Implantable Lead Connection Status: 753985
Implantable Lead Connection Status: 753985
Implantable Lead Implant Date: 20130812
Implantable Lead Implant Date: 20190513
Implantable Lead Implant Date: 20190513
Implantable Lead Location: 753858
Implantable Lead Location: 753859
Implantable Lead Location: 753860
Implantable Pulse Generator Implant Date: 20190513
Lead Channel Impedance Value: 290 Ohm
Lead Channel Impedance Value: 360 Ohm
Lead Channel Impedance Value: 910 Ohm
Lead Channel Pacing Threshold Amplitude: 0.875 V
Lead Channel Pacing Threshold Amplitude: 1.125 V
Lead Channel Pacing Threshold Amplitude: 1.25 V
Lead Channel Pacing Threshold Pulse Width: 0.2 ms
Lead Channel Pacing Threshold Pulse Width: 0.4 ms
Lead Channel Pacing Threshold Pulse Width: 0.6 ms
Lead Channel Sensing Intrinsic Amplitude: 5 mV
Lead Channel Sensing Intrinsic Amplitude: 7.3 mV
Lead Channel Setting Pacing Amplitude: 1.875
Lead Channel Setting Pacing Amplitude: 2.125
Lead Channel Setting Pacing Amplitude: 2.25 V
Lead Channel Setting Pacing Pulse Width: 0.4 ms
Lead Channel Setting Pacing Pulse Width: 0.6 ms
Lead Channel Setting Sensing Sensitivity: 4 mV
Pulse Gen Model: 3562
Pulse Gen Serial Number: 9431568

## 2023-12-17 MED ORDER — NEXLETOL 180 MG PO TABS: 180.0000 mg | ORAL_TABLET | Freq: Every day | ORAL | 6 refills | Status: DC

## 2023-12-17 MED ORDER — LOSARTAN POTASSIUM 25 MG PO TABS: 25.0000 mg | ORAL_TABLET | Freq: Two times a day (BID) | ORAL | 2 refills | Status: DC

## 2023-12-17 MED ORDER — CHLORHEXIDINE GLUCONATE 0.12 % MT SOLN
15.0000 mL | Freq: Two times a day (BID) | OROMUCOSAL | 3 refills | Status: DC
  Filled 2024-01-31: qty 473, 16d supply, fill #0

## 2023-12-17 MED ORDER — IBUPROFEN 800 MG PO TABS: 800.0000 mg | ORAL_TABLET | Freq: Four times a day (QID) | ORAL | 0 refills | Status: DC | PRN

## 2023-12-17 MED ORDER — TADALAFIL 10 MG PO TABS: 10.0000 mg | ORAL_TABLET | Freq: Every day | ORAL | 3 refills | Status: AC | PRN

## 2023-12-17 MED ORDER — NEXLETOL 180 MG PO TABS
1.0000 | ORAL_TABLET | Freq: Every day | ORAL | 3 refills | Status: AC
  Filled 2023-12-17: qty 90, 90d supply, fill #0
  Filled 2024-03-10: qty 90, 90d supply, fill #1

## 2023-12-18 ENCOUNTER — Ambulatory Visit: Payer: Self-pay | Admitting: Cardiology

## 2023-12-18 ENCOUNTER — Other Ambulatory Visit (HOSPITAL_BASED_OUTPATIENT_CLINIC_OR_DEPARTMENT_OTHER): Payer: Self-pay

## 2023-12-20 NOTE — Progress Notes (Signed)
 Remote PPM Transmission

## 2023-12-31 DIAGNOSIS — Z23 Encounter for immunization: Secondary | ICD-10-CM | POA: Diagnosis not present

## 2024-01-28 DIAGNOSIS — E7800 Pure hypercholesterolemia, unspecified: Secondary | ICD-10-CM | POA: Diagnosis not present

## 2024-01-28 DIAGNOSIS — D519 Vitamin B12 deficiency anemia, unspecified: Secondary | ICD-10-CM | POA: Diagnosis not present

## 2024-01-28 DIAGNOSIS — G8929 Other chronic pain: Secondary | ICD-10-CM | POA: Diagnosis not present

## 2024-01-28 DIAGNOSIS — M25562 Pain in left knee: Secondary | ICD-10-CM | POA: Diagnosis not present

## 2024-01-28 DIAGNOSIS — I779 Disorder of arteries and arterioles, unspecified: Secondary | ICD-10-CM | POA: Diagnosis not present

## 2024-01-28 DIAGNOSIS — M25561 Pain in right knee: Secondary | ICD-10-CM | POA: Diagnosis not present

## 2024-01-28 DIAGNOSIS — F988 Other specified behavioral and emotional disorders with onset usually occurring in childhood and adolescence: Secondary | ICD-10-CM | POA: Diagnosis not present

## 2024-01-29 DIAGNOSIS — Z79899 Other long term (current) drug therapy: Secondary | ICD-10-CM | POA: Diagnosis not present

## 2024-01-29 DIAGNOSIS — E782 Mixed hyperlipidemia: Secondary | ICD-10-CM | POA: Diagnosis not present

## 2024-01-29 DIAGNOSIS — D519 Vitamin B12 deficiency anemia, unspecified: Secondary | ICD-10-CM | POA: Diagnosis not present

## 2024-01-30 ENCOUNTER — Other Ambulatory Visit (HOSPITAL_BASED_OUTPATIENT_CLINIC_OR_DEPARTMENT_OTHER): Payer: Self-pay

## 2024-01-30 ENCOUNTER — Other Ambulatory Visit: Payer: Self-pay

## 2024-01-30 MED ORDER — AMPHETAMINE-DEXTROAMPHET ER 10 MG PO CP24
10.0000 mg | ORAL_CAPSULE | Freq: Every day | ORAL | 0 refills | Status: DC
  Filled 2024-01-30: qty 90, 90d supply, fill #0

## 2024-01-30 MED ORDER — TADALAFIL 10 MG PO TABS
10.0000 mg | ORAL_TABLET | Freq: Every day | ORAL | 3 refills | Status: DC | PRN
  Filled 2024-01-30 (×2): qty 90, 90d supply, fill #0

## 2024-01-31 ENCOUNTER — Other Ambulatory Visit (HOSPITAL_BASED_OUTPATIENT_CLINIC_OR_DEPARTMENT_OTHER): Payer: Self-pay

## 2024-01-31 MED FILL — Metoprolol Succinate Tab ER 24HR 25 MG (Tartrate Equiv): ORAL | 90 days supply | Qty: 45 | Fill #0 | Status: CN

## 2024-02-07 MED FILL — Atorvastatin Calcium Tab 80 MG (Base Equivalent): ORAL | 90 days supply | Qty: 90 | Fill #0 | Status: AC

## 2024-02-08 ENCOUNTER — Other Ambulatory Visit (HOSPITAL_BASED_OUTPATIENT_CLINIC_OR_DEPARTMENT_OTHER): Payer: Self-pay

## 2024-02-28 ENCOUNTER — Other Ambulatory Visit (HOSPITAL_BASED_OUTPATIENT_CLINIC_OR_DEPARTMENT_OTHER): Payer: Self-pay

## 2024-02-28 MED ORDER — PENICILLIN V POTASSIUM 500 MG PO TABS
500.0000 mg | ORAL_TABLET | Freq: Four times a day (QID) | ORAL | 0 refills | Status: DC
  Filled 2024-02-28 (×2): qty 28, 7d supply, fill #0

## 2024-02-28 MED ORDER — CHLORHEXIDINE GLUCONATE 0.12 % MT SOLN
15.0000 mL | Freq: Two times a day (BID) | OROMUCOSAL | 1 refills | Status: DC
  Filled 2024-02-28: qty 473, 16d supply, fill #0
  Filled 2024-03-10: qty 473, 16d supply, fill #1

## 2024-02-28 MED ORDER — DOXYCYCLINE HYCLATE 100 MG PO CAPS
100.0000 mg | ORAL_CAPSULE | Freq: Two times a day (BID) | ORAL | 0 refills | Status: DC
  Filled 2024-02-28: qty 28, 14d supply, fill #0

## 2024-02-29 ENCOUNTER — Other Ambulatory Visit (HOSPITAL_BASED_OUTPATIENT_CLINIC_OR_DEPARTMENT_OTHER): Payer: Self-pay

## 2024-02-29 MED FILL — Atorvastatin Calcium Tab 80 MG (Base Equivalent): ORAL | 90 days supply | Qty: 90 | Fill #1 | Status: AC

## 2024-03-10 ENCOUNTER — Other Ambulatory Visit (HOSPITAL_BASED_OUTPATIENT_CLINIC_OR_DEPARTMENT_OTHER): Payer: Self-pay

## 2024-03-11 ENCOUNTER — Other Ambulatory Visit (HOSPITAL_BASED_OUTPATIENT_CLINIC_OR_DEPARTMENT_OTHER): Payer: Self-pay

## 2024-03-13 ENCOUNTER — Other Ambulatory Visit: Payer: Self-pay | Admitting: *Deleted

## 2024-03-17 ENCOUNTER — Ambulatory Visit (INDEPENDENT_AMBULATORY_CARE_PROVIDER_SITE_OTHER)
Admission: RE | Admit: 2024-03-17 | Discharge: 2024-03-17 | Disposition: A | Source: Ambulatory Visit | Attending: Cardiology | Admitting: Cardiology

## 2024-03-17 ENCOUNTER — Encounter: Payer: Self-pay | Admitting: Cardiology

## 2024-03-17 ENCOUNTER — Ambulatory Visit: Admitting: Cardiology

## 2024-03-17 ENCOUNTER — Ambulatory Visit: Payer: Medicare Other

## 2024-03-17 VITALS — BP 122/62 | HR 78 | Ht 71.0 in | Wt 159.4 lb

## 2024-03-17 DIAGNOSIS — I739 Peripheral vascular disease, unspecified: Secondary | ICD-10-CM

## 2024-03-17 DIAGNOSIS — R058 Other specified cough: Secondary | ICD-10-CM | POA: Diagnosis not present

## 2024-03-17 DIAGNOSIS — I10 Essential (primary) hypertension: Secondary | ICD-10-CM | POA: Diagnosis not present

## 2024-03-17 DIAGNOSIS — Z8679 Personal history of other diseases of the circulatory system: Secondary | ICD-10-CM

## 2024-03-17 DIAGNOSIS — I2510 Atherosclerotic heart disease of native coronary artery without angina pectoris: Secondary | ICD-10-CM | POA: Diagnosis not present

## 2024-03-17 DIAGNOSIS — Z951 Presence of aortocoronary bypass graft: Secondary | ICD-10-CM

## 2024-03-17 DIAGNOSIS — I255 Ischemic cardiomyopathy: Secondary | ICD-10-CM

## 2024-03-17 DIAGNOSIS — Z9889 Other specified postprocedural states: Secondary | ICD-10-CM

## 2024-03-18 LAB — CUP PACEART REMOTE DEVICE CHECK
Battery Remaining Longevity: 22 mo
Battery Remaining Percentage: 21 %
Battery Voltage: 2.92 V
Brady Statistic AP VP Percent: 1 %
Brady Statistic AP VS Percent: 1 %
Brady Statistic AS VP Percent: 98 %
Brady Statistic AS VS Percent: 1 %
Brady Statistic RA Percent Paced: 1 %
Date Time Interrogation Session: 20260202020011
Implantable Lead Connection Status: 753985
Implantable Lead Connection Status: 753985
Implantable Lead Connection Status: 753985
Implantable Lead Implant Date: 20130812
Implantable Lead Implant Date: 20190513
Implantable Lead Implant Date: 20190513
Implantable Lead Location: 753858
Implantable Lead Location: 753859
Implantable Lead Location: 753860
Implantable Pulse Generator Implant Date: 20190513
Lead Channel Impedance Value: 280 Ohm
Lead Channel Impedance Value: 380 Ohm
Lead Channel Impedance Value: 930 Ohm
Lead Channel Pacing Threshold Amplitude: 0.75 V
Lead Channel Pacing Threshold Amplitude: 1 V
Lead Channel Pacing Threshold Amplitude: 1 V
Lead Channel Pacing Threshold Pulse Width: 0.2 ms
Lead Channel Pacing Threshold Pulse Width: 0.4 ms
Lead Channel Pacing Threshold Pulse Width: 0.6 ms
Lead Channel Sensing Intrinsic Amplitude: 5 mV
Lead Channel Sensing Intrinsic Amplitude: 9.9 mV
Lead Channel Setting Pacing Amplitude: 1.75 V
Lead Channel Setting Pacing Amplitude: 2 V
Lead Channel Setting Pacing Amplitude: 2 V
Lead Channel Setting Pacing Pulse Width: 0.4 ms
Lead Channel Setting Pacing Pulse Width: 0.6 ms
Lead Channel Setting Sensing Sensitivity: 4 mV
Pulse Gen Model: 3562
Pulse Gen Serial Number: 9431568

## 2024-03-19 ENCOUNTER — Telehealth: Payer: Self-pay

## 2024-03-19 ENCOUNTER — Ambulatory Visit: Payer: Self-pay | Admitting: Cardiology

## 2024-03-19 NOTE — Telephone Encounter (Signed)
 Left message on My Chart with Chest X-ray results per Dr. Karry note. Routed to PCP.

## 2024-03-20 ENCOUNTER — Encounter: Payer: Self-pay | Admitting: Surgery

## 2024-03-20 ENCOUNTER — Other Ambulatory Visit (HOSPITAL_BASED_OUTPATIENT_CLINIC_OR_DEPARTMENT_OTHER): Payer: Self-pay | Admitting: Surgery

## 2024-03-20 ENCOUNTER — Telehealth: Payer: Self-pay

## 2024-03-20 DIAGNOSIS — R222 Localized swelling, mass and lump, trunk: Secondary | ICD-10-CM

## 2024-03-20 NOTE — Telephone Encounter (Signed)
 Pt viewed Chest X-ray results on My Chart per Dr. Karry note. Routed to PCP

## 2024-03-24 ENCOUNTER — Inpatient Hospital Stay (HOSPITAL_BASED_OUTPATIENT_CLINIC_OR_DEPARTMENT_OTHER): Admission: RE | Admit: 2024-03-24 | Source: Ambulatory Visit | Admitting: Radiology

## 2024-06-16 ENCOUNTER — Ambulatory Visit: Payer: Medicare Other
